# Patient Record
Sex: Male | Born: 1940 | Race: Black or African American | Hispanic: No | Marital: Married | Smoking: Former smoker
Health system: Southern US, Community
[De-identification: ages and names within clinical notes are randomized; demographics above are authoritative.]

## PROBLEM LIST (undated history)

## (undated) DIAGNOSIS — N390 Urinary tract infection, site not specified: Secondary | ICD-10-CM

## (undated) DIAGNOSIS — J849 Interstitial pulmonary disease, unspecified: Secondary | ICD-10-CM

## (undated) DIAGNOSIS — I10 Essential (primary) hypertension: Secondary | ICD-10-CM

## (undated) DIAGNOSIS — I77810 Thoracic aortic ectasia: Secondary | ICD-10-CM

## (undated) DIAGNOSIS — N1831 Chronic kidney disease, stage 3a: Secondary | ICD-10-CM

## (undated) DIAGNOSIS — I2510 Atherosclerotic heart disease of native coronary artery without angina pectoris: Secondary | ICD-10-CM

## (undated) DIAGNOSIS — M810 Age-related osteoporosis without current pathological fracture: Secondary | ICD-10-CM

## (undated) DIAGNOSIS — I350 Nonrheumatic aortic (valve) stenosis: Secondary | ICD-10-CM

## (undated) DIAGNOSIS — R011 Cardiac murmur, unspecified: Secondary | ICD-10-CM

## (undated) DIAGNOSIS — M109 Gout, unspecified: Secondary | ICD-10-CM

## (undated) DIAGNOSIS — E119 Type 2 diabetes mellitus without complications: Secondary | ICD-10-CM

## (undated) DIAGNOSIS — M1990 Unspecified osteoarthritis, unspecified site: Secondary | ICD-10-CM

## (undated) HISTORY — DX: Unspecified osteoarthritis, unspecified site: M19.90

## (undated) HISTORY — DX: Gout, unspecified: M10.9

## (undated) HISTORY — DX: Age-related osteoporosis without current pathological fracture: M81.0

## (undated) HISTORY — DX: Thoracic aortic ectasia: I77.810

## (undated) HISTORY — DX: Essential (primary) hypertension: I10

## (undated) HISTORY — DX: Type 2 diabetes mellitus without complications: E11.9

## (undated) HISTORY — DX: Nonrheumatic aortic (valve) stenosis: I35.0

## (undated) HISTORY — PX: JOINT REPLACEMENT: SHX530

## (undated) HISTORY — DX: Interstitial pulmonary disease, unspecified: J84.9

## (undated) HISTORY — DX: Atherosclerotic heart disease of native coronary artery without angina pectoris: I25.10

## (undated) HISTORY — DX: Chronic kidney disease, stage 3a: N18.31

---

## 2005-07-12 ENCOUNTER — Inpatient Hospital Stay (HOSPITAL_COMMUNITY): Admission: EM | Admit: 2005-07-12 | Discharge: 2005-07-16 | Payer: Self-pay | Admitting: Emergency Medicine

## 2005-07-14 ENCOUNTER — Ambulatory Visit: Payer: Self-pay | Admitting: Critical Care Medicine

## 2005-08-07 ENCOUNTER — Ambulatory Visit: Payer: Self-pay | Admitting: Critical Care Medicine

## 2005-09-14 ENCOUNTER — Ambulatory Visit (HOSPITAL_BASED_OUTPATIENT_CLINIC_OR_DEPARTMENT_OTHER): Admission: RE | Admit: 2005-09-14 | Discharge: 2005-09-14 | Payer: Self-pay | Admitting: Critical Care Medicine

## 2005-09-22 ENCOUNTER — Ambulatory Visit: Payer: Self-pay | Admitting: Pulmonary Disease

## 2006-06-24 ENCOUNTER — Encounter (HOSPITAL_COMMUNITY): Admission: RE | Admit: 2006-06-24 | Discharge: 2006-09-22 | Payer: Self-pay

## 2008-10-06 ENCOUNTER — Inpatient Hospital Stay (HOSPITAL_COMMUNITY): Admission: RE | Admit: 2008-10-06 | Discharge: 2008-10-07 | Payer: Self-pay | Admitting: Internal Medicine

## 2008-10-06 ENCOUNTER — Ambulatory Visit: Payer: Self-pay | Admitting: Radiology

## 2008-10-06 ENCOUNTER — Encounter: Payer: Self-pay | Admitting: Emergency Medicine

## 2010-01-02 ENCOUNTER — Encounter: Admission: RE | Admit: 2010-01-02 | Discharge: 2010-01-02 | Payer: Self-pay | Admitting: Gastroenterology

## 2010-08-26 LAB — DIFFERENTIAL
Basophils Absolute: 0.1 10*3/uL (ref 0.0–0.1)
Monocytes Relative: 7 % (ref 3–12)
Neutro Abs: 4.9 10*3/uL (ref 1.7–7.7)

## 2010-08-26 LAB — COMPREHENSIVE METABOLIC PANEL
Alkaline Phosphatase: 73 U/L (ref 39–117)
Calcium: 10.4 mg/dL (ref 8.4–10.5)
Chloride: 99 mEq/L (ref 96–112)
Creatinine, Ser: 1.2 mg/dL (ref 0.4–1.5)
GFR calc non Af Amer: >60 mL/min (ref >60)
Potassium: 4.1 mEq/L (ref 3.5–5.1)
Sodium: 141 mEq/L (ref 135–145)
Total Bilirubin: 0.6 mg/dL (ref 0.3–1.2)

## 2010-08-26 LAB — LIPASE, BLOOD: Lipase: 114 U/L (ref 23–300)

## 2010-08-26 LAB — URINALYSIS, ROUTINE W REFLEX MICROSCOPIC
Glucose, UA: NEGATIVE mg/dL
Protein, ur: NEGATIVE mg/dL
pH: 7.5 (ref 5.0–8.0)

## 2010-08-26 LAB — CBC
MCHC: 34 g/dL (ref 30.0–36.0)
Platelets: 256 10*3/uL (ref 150–400)
RBC: 5.01 MIL/uL (ref 4.22–5.81)
RDW: 12.6 % (ref 11.5–15.5)
WBC: 7 10*3/uL (ref 4.0–10.5)

## 2010-08-26 LAB — RAPID URINE DRUG SCREEN, HOSP PERFORMED
Amphetamines: NOT DETECTED
Benzodiazepines: NOT DETECTED

## 2010-08-26 LAB — HEPATITIS PANEL, ACUTE: HCV Ab: NEGATIVE

## 2010-08-26 LAB — URINE MICROSCOPIC-ADD ON

## 2010-08-26 LAB — CARDIAC PANEL(CRET KIN+CKTOT+MB+TROPI)
CK, MB: 2.9 ng/mL (ref 0.3–4.0)
Total CK: 152 U/L (ref 7–232)

## 2010-08-26 LAB — URINE CULTURE

## 2010-09-30 NOTE — Consult Note (Signed)
[name], [name] NO.:  [id number]   MEDICAL RECORD NO.:  [id number]          PATIENT TYPE:  INP   LOCATION:  1320                         FACILITY:  [facility]   PHYSICIAN:  [name] Steele, M.D. DATE OF BIRTH:  06/17/1940   DATE OF CONSULTATION:  10/06/2008  DATE OF DISCHARGE:  10/07/2008                                 CONSULTATION   REFERRING PHYSICIAN:  Carleene Steele, M.D.  [name] Orleans Lawyer, PA-C.   REASON FOR CONSULTATION:  Right upper quadrant abdominal pain since last  night.   IMPRESSION:  1. Acute non-infectious hepatitis secondary to erythromycin.  2. Hypertension, controlled.  3. Diabetes mellitus type 2, controlled.  4. Urinary tract infection being treated.  5. History of recurrent urinary tract infection.   RECOMMENDATIONS:  Discharge charge home with advice to discontinue  erythromycin.  Recommend light diet and oxycodone p.r.n. for pain for  two days.[name] Steele course of Reglan and omprazole. Follow up with primary  care physician in the coming week.  Will draw blood for cardiac enzymes  prior to discharge just to be sure this has not been atypical  presentation of myocardial ischemia.  Will draw blood for an acute  hepatitis,, although the erythromycin seems to be the most likely  culprit.   HISTORY OF PRESENT ILLNESS:  This is a 70 year old gentleman with  history of recurrent urinary tract infections, recently diagnosed with  urinary tract infection and prescribed Cipro which he started taking 2  days ago, also recently had a dental extraction and was prescribed  erythromycin which he started taking yesterday.  Last night developed an  episode of nausea and diarrhea and woke with severe right upper quadrant  abdominal pain, not relieved by Pepto-Bismol or Rolaids.  Eventually, as  the pain persisted, the patient presented to the free-standing emergency  room at [facility] and was evaluated for right upper quadrant abdominal  pain with  CT scan of the abdomen and pelvis with contrast.  CT scan did  not show any evidence of small bowel obstruction.  There were a few  loops of bowel that showed ileus.  The liver was fatty.  There were no  dilated biliary ducts.   The patient had complete metabolic panel done and ALT and AST were noted  to be elevated.  The patient gave a history of having had a reaction to  statins and has discontinued statins.  The impression was he had  discontinued statin within the past month and it was felt that was the  cause of abnormal liver functions tests. However, further history  reveals the patient was not taking any statins for about 2 years.  Therefore, it seems most likely that the elevated liver functions due to  recent use of erythromycin. Patient received hepatis immunization prior  to visit to [place] some years ago.   The patient has had no nausea or vomiting.  No fever, no chills.  The  pain is not colicky in nature.  He denies any cough or cold.   PAST MEDICAL HISTORY:  1. Hypertension.  2. Diabetes.  3. Recurrent urinary tract infection.  4. Periodically evaluated by urologist.  5. Gout.  6. History of bilateral knee replacement.   MEDICATIONS:  1. Allopurinol 100 mg twice daily.  2. Nifedipine XL 90 mg daily.  3. HCTZ 25 mg daily.  4. Aspirin 81 mg daily.  5. Avapro 300 mg daily.  6. Colchicine 0.6 mg p.r.n.  7. Metaglip 2.5/250 mg one tablet twice daily.  8. Fish oil 1000 mg daily.  9. Erythromycin 500 mg every six hours, started yesterday.  10.Cipro 250 mg daily, started May 20.   ALLERGIES:  PENICILLIN.   SOCIAL HISTORY:  Denies tobacco or illicit drug use.  Drinks alcohol  maybe once every 3 months.  The patient is a retired [employer] logistics/support/administrative officer.   FAMILY HISTORY:  The patient is only aware that his mother had end-stage  kidney disease but did not grow up around his family and, therefore, is  not aware of much of his family history.   REVIEW OF SYSTEMS:   A 10-point review of systems, other than noted  above, the patient is hard of hearing, but 10-point review of systems is  otherwise unremarkable.   PHYSICAL EXAMINATION:  GENERAL:  Pleasant, elderly gentleman, reclining  in the bed in no acute distress.  VITAL SIGNS:  Temperature 97.6, pulse 92, respirations 18, blood  pressure 120/60. Oxygen saturation 96% on room air.  HEENT:  Pupils are round and equal.  His mucous membranes are pink,  moist and anicteric.  He is not dehydrated.  NECK:  No cervical lymphadenopathy or thyromegaly.  No jugular venous  distension.  No carotid bruits.  CHEST:  Clear to auscultation bilaterally.  CARDIOVASCULAR:  Regular rhythm without murmur.  ABDOMEN:  Tender in the right upper quadrant.  He has normal abdominal  bowel sounds.  EXTREMITIES:  He has venous stasis changes but there is no calf  tenderness.  Bilateral knee scars, status post knee replacement.  CENTRAL NERVOUS SYSTEM:  Cranial nerves II-XII grossly intact.  No focal  neurologic deficits.   LABORATORY DATA:  CBC reviewed and his white count is 7, hemoglobin 16,  platelets 256,000.  Normal differential on his white count.  His  complete metabolic panel shows sodium 141, potassium 4.1, chloride 99,  CO2 26, BUN 17, creatinine 1.2, glucose 170, calcium 7.4, total protein  81, albumin 4.6, AST 77, ALT 71, both slightly elevated.  Alk phos is  normal at 73.  Total bilirubin normal at 0.6.  Lipase 114.  Urinalysis  showed cloudy urine with  a specific gravity of 1.019, 15 ketones, nitrites positive, leukocyte  esterase large.  Microscopy showed 21-50 white cells and many bacteria.  CT scan of the abdomen showed nonspecific ileus.  CT scan of the pelvis  showed a second bladder but no acute abnormalities.      [name] Steele, M.D.  Electronically Signed     LC/MEDQ  D:  10/07/2008  T:  10/07/2008  Job:  [id number]   cc:   [name] Steele, M.D.  [facility]   [name] Steele, M.D.  Fax: [phone]   [name] Dolly, PA
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
## 2010-10-03 NOTE — Procedures (Signed)
NAME:  [name] Steele, [name] Steele NO.:  [id number]   MEDICAL RECORD NO.:  [id number]          PATIENT TYPE:  OUT   LOCATION:  SLEEP CENTER                 FACILITY:  [facility]   PHYSICIAN:  Marcelyn Bruins, M.D. [facility] DATE OF BIRTH:  [birth year]   DATE OF STUDY:  09/14/2005                              NOCTURNAL POLYSOMNOGRAM   REFERRING PHYSICIAN:  Dr. Shan Levans   INDICATIONS:  Hypersomnia with sleep apnea.   EPWORTH SLEEPINESS SCORE:  2   SLEEP ARCHITECTURE:  The patient had a total sleep time of only 76 minutes.  He states since his retirement his usual bedtime has been approximately 3  a.m.  He never achieved slow wave sleep or REM.  Sleep onset latency was  very prolonged at 173 minutes.  Sleep efficiency was only 18%.   RESPIRATORY DATA:  The patient was found to have 49 hypopneas and 24 apneas  over the 76 minutes which gave him a respiratory disturbance index of 58  events per hour.  As soon as the patient fell asleep on a consistent basis  he began to have very significant obstructive sleep apnea.  The events  occurred all during the study and the events during the study occurred in  the supine position.  There was moderate to loud snoring noted throughout.   OXYGEN DATA:  There was O2 desaturation as low as 84% with the patient's  obstructive events.   CARDIAC DATA:  No clinically significant cardiac arrhythmias.   MOVEMENT-PARASOMNIA:  Small numbers of leg jerks with no significant sleep  disruption.   IMPRESSIONS-RECOMMENDATIONS:  Severe obstructive sleep apnea/hypopnea  syndrome with a respiratory disturbance index of 58 events per hour and O2  desaturation as low as 84%.  Despite the patient having a total sleep time  of only 76 minutes because of his abnormal sleep schedule, he began to have  very significant obstructive events as soon as he fell into a consistent  sleep.  Treatment for this degree of sleep apnea should focus primarily on  weight loss  coupled with CPAP.           ______________________________  Marcelyn Bruins, M.D. [facility]  Diplomate, American Board of Sleep  Medicine     KC/MEDQ  D:  09/22/2005 16:39:21  T:  09/23/2005 15:06:03  Job:  [id number]
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
## 2011-05-21 DIAGNOSIS — E119 Type 2 diabetes mellitus without complications: Secondary | ICD-10-CM | POA: Diagnosis not present

## 2011-05-21 DIAGNOSIS — I10 Essential (primary) hypertension: Secondary | ICD-10-CM | POA: Diagnosis not present

## 2011-08-20 DIAGNOSIS — E119 Type 2 diabetes mellitus without complications: Secondary | ICD-10-CM | POA: Diagnosis not present

## 2011-09-01 DIAGNOSIS — H905 Unspecified sensorineural hearing loss: Secondary | ICD-10-CM | POA: Diagnosis not present

## 2012-05-03 DIAGNOSIS — N3020 Other chronic cystitis without hematuria: Secondary | ICD-10-CM | POA: Diagnosis not present

## 2013-03-17 DIAGNOSIS — E119 Type 2 diabetes mellitus without complications: Secondary | ICD-10-CM | POA: Diagnosis not present

## 2013-03-17 DIAGNOSIS — I10 Essential (primary) hypertension: Secondary | ICD-10-CM | POA: Diagnosis not present

## 2013-03-21 DIAGNOSIS — N400 Enlarged prostate without lower urinary tract symptoms: Secondary | ICD-10-CM | POA: Diagnosis not present

## 2013-03-21 DIAGNOSIS — Z125 Encounter for screening for malignant neoplasm of prostate: Secondary | ICD-10-CM | POA: Diagnosis not present

## 2013-03-21 DIAGNOSIS — N3020 Other chronic cystitis without hematuria: Secondary | ICD-10-CM | POA: Diagnosis not present

## 2013-03-23 DIAGNOSIS — R51 Headache: Secondary | ICD-10-CM | POA: Diagnosis not present

## 2013-03-23 DIAGNOSIS — E119 Type 2 diabetes mellitus without complications: Secondary | ICD-10-CM | POA: Diagnosis not present

## 2013-03-23 DIAGNOSIS — Z006 Encounter for examination for normal comparison and control in clinical research program: Secondary | ICD-10-CM | POA: Diagnosis not present

## 2013-03-23 DIAGNOSIS — I10 Essential (primary) hypertension: Secondary | ICD-10-CM | POA: Diagnosis not present

## 2013-03-23 DIAGNOSIS — M542 Cervicalgia: Secondary | ICD-10-CM | POA: Diagnosis not present

## 2013-03-23 DIAGNOSIS — M549 Dorsalgia, unspecified: Secondary | ICD-10-CM | POA: Diagnosis not present

## 2013-04-11 DIAGNOSIS — I10 Essential (primary) hypertension: Secondary | ICD-10-CM | POA: Diagnosis not present

## 2013-04-11 DIAGNOSIS — E119 Type 2 diabetes mellitus without complications: Secondary | ICD-10-CM | POA: Diagnosis not present

## 2013-05-30 DIAGNOSIS — M899 Disorder of bone, unspecified: Secondary | ICD-10-CM | POA: Diagnosis not present

## 2013-05-30 DIAGNOSIS — W19XXXA Unspecified fall, initial encounter: Secondary | ICD-10-CM | POA: Diagnosis not present

## 2013-05-30 DIAGNOSIS — M949 Disorder of cartilage, unspecified: Secondary | ICD-10-CM | POA: Diagnosis not present

## 2013-05-30 DIAGNOSIS — Z880 Allergy status to penicillin: Secondary | ICD-10-CM | POA: Diagnosis not present

## 2013-05-30 DIAGNOSIS — I10 Essential (primary) hypertension: Secondary | ICD-10-CM | POA: Diagnosis not present

## 2013-07-11 DIAGNOSIS — I10 Essential (primary) hypertension: Secondary | ICD-10-CM | POA: Diagnosis not present

## 2013-07-11 DIAGNOSIS — M7700 Medial epicondylitis, unspecified elbow: Secondary | ICD-10-CM | POA: Diagnosis not present

## 2014-04-26 DIAGNOSIS — M5134 Other intervertebral disc degeneration, thoracic region: Secondary | ICD-10-CM | POA: Diagnosis not present

## 2014-04-26 DIAGNOSIS — M109 Gout, unspecified: Secondary | ICD-10-CM | POA: Diagnosis not present

## 2014-04-26 DIAGNOSIS — M546 Pain in thoracic spine: Secondary | ICD-10-CM | POA: Diagnosis not present

## 2014-04-26 DIAGNOSIS — M19049 Primary osteoarthritis, unspecified hand: Secondary | ICD-10-CM | POA: Diagnosis not present

## 2014-04-26 DIAGNOSIS — M79672 Pain in left foot: Secondary | ICD-10-CM | POA: Diagnosis not present

## 2014-04-26 DIAGNOSIS — M19071 Primary osteoarthritis, right ankle and foot: Secondary | ICD-10-CM | POA: Diagnosis not present

## 2014-04-26 DIAGNOSIS — M2550 Pain in unspecified joint: Secondary | ICD-10-CM | POA: Diagnosis not present

## 2014-05-05 ENCOUNTER — Ambulatory Visit (INDEPENDENT_AMBULATORY_CARE_PROVIDER_SITE_OTHER): Payer: Medicare Other

## 2014-05-05 ENCOUNTER — Ambulatory Visit (INDEPENDENT_AMBULATORY_CARE_PROVIDER_SITE_OTHER): Payer: Medicare Other | Admitting: Family Medicine

## 2014-05-05 VITALS — BP 140/82 | HR 97 | Temp 98.6°F | Resp 16 | Ht 68.5 in | Wt 207.4 lb

## 2014-05-05 DIAGNOSIS — R059 Cough, unspecified: Secondary | ICD-10-CM

## 2014-05-05 DIAGNOSIS — R609 Edema, unspecified: Secondary | ICD-10-CM

## 2014-05-05 DIAGNOSIS — R05 Cough: Secondary | ICD-10-CM

## 2014-05-05 DIAGNOSIS — M79672 Pain in left foot: Secondary | ICD-10-CM | POA: Diagnosis not present

## 2014-05-05 LAB — POCT CBC
Granulocyte percent: 75 %G (ref 37–80)
HCT, POC: 46.3 % (ref 43.5–53.7)
Hemoglobin: 15 g/dL (ref 14.1–18.1)
Lymph, poc: 1.5 (ref 0.6–3.4)
MCH, POC: 30.6 pg (ref 27–31.2)
MCHC: 32.4 g/dL (ref 31.8–35.4)
MCV: 94.2 fL (ref 80–97)
MID (cbc): 0.7 (ref 0–0.9)
MPV: 6.8 fL (ref 0–99.8)
POC Granulocyte: 6.7 (ref 2–6.9)
POC LYMPH PERCENT: 16.7 %L (ref 10–50)
POC MID %: 8.3 %M (ref 0–12)
Platelet Count, POC: 255 10*3/uL (ref 142–424)
RBC: 4.91 M/uL (ref 4.69–6.13)
RDW, POC: 13.8 %
WBC: 8.9 10*3/uL (ref 4.6–10.2)

## 2014-05-05 MED ORDER — HYDROCODONE-ACETAMINOPHEN 10-325 MG PO TABS: 1.0000 | ORAL_TABLET | Freq: Three times a day (TID) | ORAL | Status: DC | PRN

## 2014-05-05 MED ORDER — METHYLPREDNISOLONE ACETATE 80 MG/ML IJ SUSP
80.0000 mg | Freq: Once | INTRAMUSCULAR | Status: AC
  Administered 2014-05-05: 80 mg via INTRAMUSCULAR

## 2014-05-05 MED ORDER — HYDROCODONE-HOMATROPINE 5-1.5 MG/5ML PO SYRP: 5.0000 mL | ORAL_SOLUTION | Freq: Three times a day (TID) | ORAL | Status: DC | PRN

## 2014-05-05 NOTE — Patient Instructions (Signed)
Acute Bronchitis Bronchitis is inflammation of the airways that extend from the windpipe into the lungs (bronchi). The inflammation often causes mucus to develop. This leads to a cough, which is the most common symptom of bronchitis.  In acute bronchitis, the condition usually develops suddenly and goes away over time, usually in a couple weeks. Smoking, allergies, and asthma can make bronchitis worse. Repeated episodes of bronchitis may cause further lung problems.  CAUSES Acute bronchitis is most often caused by the same virus that causes a cold. The virus can spread from person to person (contagious) through coughing, sneezing, and touching contaminated objects. SIGNS AND SYMPTOMS   Cough.   Fever.   Coughing up mucus.   Body aches.   Chest congestion.   Chills.   Shortness of breath.   Sore throat.  DIAGNOSIS  Acute bronchitis is usually diagnosed through a physical exam. Your health care provider will also ask you questions about your medical history. Tests, such as chest X-rays, are sometimes done to rule out other conditions.  TREATMENT  Acute bronchitis usually goes away in a couple weeks. Oftentimes, no medical treatment is necessary. Medicines are sometimes given for relief of fever or cough. Antibiotic medicines are usually not needed but may be prescribed in certain situations. In some cases, an inhaler may be recommended to help reduce shortness of breath and control the cough. A cool mist vaporizer may also be used to help thin bronchial secretions and make it easier to clear the chest.  HOME CARE INSTRUCTIONS  Get plenty of rest.   Drink enough fluids to keep your urine clear or pale yellow (unless you have a medical condition that requires fluid restriction). Increasing fluids may help thin your respiratory secretions (sputum) and reduce chest congestion, and it will prevent dehydration.   Take medicines only as directed by your health care provider.  If  you were prescribed an antibiotic medicine, finish it all even if you start to feel better.  Avoid smoking and secondhand smoke. Exposure to cigarette smoke or irritating chemicals will make bronchitis worse. If you are a smoker, consider using nicotine gum or skin patches to help control withdrawal symptoms. Quitting smoking will help your lungs heal faster.   Reduce the chances of another bout of acute bronchitis by washing your hands frequently, avoiding people with cold symptoms, and trying not to touch your hands to your mouth, nose, or eyes.   Keep all follow-up visits as directed by your health care provider.  SEEK MEDICAL CARE IF: Your symptoms do not improve after 1 week of treatment.  SEEK IMMEDIATE MEDICAL CARE IF:  You develop an increased fever or chills.   You have chest pain.   You have severe shortness of breath.  You have bloody sputum.   You develop dehydration.  You faint or repeatedly feel like you are going to pass out.  You develop repeated vomiting.  You develop a severe headache. MAKE SURE YOU:   Understand these instructions.  Will watch your condition.  Will get help right away if you are not doing well or get worse. Document Released: 06/11/2004 Document Revised: 09/18/2013 Document Reviewed: 10/25/2012 [facility] Patient Information 2015 [place], [place]. This information is not intended to replace advice given to you by your health care provider. Make sure you discuss any questions you have with your health care provider. Gout Gout is an inflammatory arthritis caused by a buildup of uric acid crystals in the joints. Uric acid is a chemical that is  normally present in the blood. When the level of uric acid in the blood is too high it can form crystals that deposit in your joints and tissues. This causes joint redness, soreness, and swelling (inflammation). Repeat attacks are common. Over time, uric acid crystals can form into masses (tophi) near a  joint, destroying bone and causing disfigurement. Gout is treatable and often preventable. CAUSES  The disease begins with elevated levels of uric acid in the blood. Uric acid is produced by your body when it breaks down a naturally found substance called purines. Certain foods you eat, such as meats and fish, contain high amounts of purines. Causes of an elevated uric acid level include:  Being passed down from parent to child (heredity).  Diseases that cause increased uric acid production (such as obesity, psoriasis, and certain cancers).  Excessive alcohol use.  Diet, especially diets rich in meat and seafood.  Medicines, including certain cancer-fighting medicines (chemotherapy), water pills (diuretics), and aspirin.  Chronic kidney disease. The kidneys are no longer able to remove uric acid well.  Problems with metabolism. Conditions strongly associated with gout include:  Obesity.  High blood pressure.  High cholesterol.  Diabetes. Not everyone with elevated uric acid levels gets gout. It is not understood why some people get gout and others do not. Surgery, joint injury, and eating too much of certain foods are some of the factors that can lead to gout attacks. SYMPTOMS   An attack of gout comes on quickly. It causes intense pain with redness, swelling, and warmth in a joint.  Fever can occur.  Often, only one joint is involved. Certain joints are more commonly involved:  Base of the big toe.  Knee.  Ankle.  Wrist.  Finger. Without treatment, an attack usually goes away in a few days to weeks. Between attacks, you usually will not have symptoms, which is different from many other forms of arthritis. DIAGNOSIS  Your caregiver will suspect gout based on your symptoms and exam. In some cases, tests may be recommended. The tests may include:  Blood tests.  Urine tests.  X-rays.  Joint fluid exam. This exam requires a needle to remove fluid from the joint  (arthrocentesis). Using a microscope, gout is confirmed when uric acid crystals are seen in the joint fluid. TREATMENT  There are two phases to gout treatment: treating the sudden onset (acute) attack and preventing attacks (prophylaxis).  Treatment of an Acute Attack.  Medicines are used. These include anti-inflammatory medicines or steroid medicines.  An injection of steroid medicine into the affected joint is sometimes necessary.  The painful joint is rested. Movement can worsen the arthritis.  You may use warm or cold treatments on painful joints, depending which works best for you.  Treatment to Prevent Attacks.  If you suffer from frequent gout attacks, your caregiver may advise preventive medicine. These medicines are started after the acute attack subsides. These medicines either help your kidneys eliminate uric acid from your body or decrease your uric acid production. You may need to stay on these medicines for a very long time.  The early phase of treatment with preventive medicine can be associated with an increase in acute gout attacks. For this reason, during the first few months of treatment, your caregiver may also advise you to take medicines usually used for acute gout treatment. Be sure you understand your caregiver's directions. Your caregiver may make several adjustments to your medicine dose before these medicines are effective.  Discuss dietary treatment  with your caregiver or dietitian. Alcohol and drinks high in sugar and fructose and foods such as meat, poultry, and seafood can increase uric acid levels. Your caregiver or dietitian can advise you on drinks and foods that should be limited. HOME CARE INSTRUCTIONS   Do not take aspirin to relieve pain. This raises uric acid levels.  Only take over-the-counter or prescription medicines for pain, discomfort, or fever as directed by your caregiver.  Rest the joint as much as possible. When in bed, keep sheets and  blankets off painful areas.  Keep the affected joint raised (elevated).  Apply warm or cold treatments to painful joints. Use of warm or cold treatments depends on which works best for you.  Use crutches if the painful joint is in your leg.  Drink enough fluids to keep your urine clear or pale yellow. This helps your body get rid of uric acid. Limit alcohol, sugary drinks, and fructose drinks.  Follow your dietary instructions. Pay careful attention to the amount of protein you eat. Your daily diet should emphasize fruits, vegetables, whole grains, and fat-free or low-fat milk products. Discuss the use of coffee, vitamin C, and cherries with your caregiver or dietitian. These may be helpful in lowering uric acid levels.  Maintain a healthy body weight. SEEK MEDICAL CARE IF:   You develop diarrhea, vomiting, or any side effects from medicines.  You do not feel better in 24 hours, or you are getting worse. SEEK IMMEDIATE MEDICAL CARE IF:   Your joint becomes suddenly more tender, and you have chills or a fever. MAKE SURE YOU:   Understand these instructions.  Will watch your condition.  Will get help right away if you are not doing well or get worse. Document Released: 05/01/2000 Document Revised: 09/18/2013 Document Reviewed: 12/16/2011 [facility]Exit[facility] Patient Information 2015 [place]ExitCare, [place]LLC. This information is not intended to replace advice given to you by your health care provider. Make sure you discuss any questions you have with your health care provider.

## 2014-05-05 NOTE — Progress Notes (Signed)
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  Patient's problems precipitated a visit to his PCP, Dr. Merri BrunetteWalter Pharr, who prescribed prednisone. He got a little better but then the pain now is much much worse. He's having trouble moving his left wrist as well as flexing his feet. The pain is on the dorsal left wrist, and the dorsal aspects of both feet.  He's had a cough for about 5 days with no shortness of breath or chest pain.  Overall patient feels like he is falling apart  Objective: No acute distress the patient does look uncomfortable. HEENT: Normal Neck: Supple no adenopathy Chest: Few basilar rales Heart: Regular no murmur Abdomen: Soft nontender Extremities: Moderate swelling over the dorsal left wrist, dorsal left foot, and dorsal right foot. There is no pain or tenderness or swelling in the MTP joints of his toes.  [facility] reading (PRIMARY) by  Dr. Milus GlazierLauenstein: Diffuse reticular pattern consistent with a viral infection   Results for orders placed or performed in visit on 05/05/14  POCT CBC  Result Value Ref Range   WBC 8.9 4.6 - 10.2 K/uL   Lymph, poc 1.5 0.6 - 3.4   POC LYMPH PERCENT 16.7 10 - 50 %L   MID (cbc) 0.7 0 - 0.9   POC MID % 8.3 0 - 12 %M   POC Granulocyte 6.7 2 - 6.9   Granulocyte percent 75.0 37 - 80 %G   RBC 4.91 4.69 - 6.13 M/uL   Hemoglobin 15.0 14.1 - 18.1 g/dL   HCT, POC 36.646.3 44.043.5 - 53.7 %   MCV 94.2 80 - 97 fL   MCH, POC 30.6 27 - 31.2 pg   MCHC 32.4 31.8 - 35.4 g/dL   RDW, POC 34.713.8 %   Platelet Count, POC 255 142 - 424 K/uL   MPV 6.8 0 - 99.8 fL   . Assessment: Tablet patient has a viral bronchitis and acute gout that is been incompletely treated. The call Christie's taken in the past is that working now. The prednisone he took recently only helped him moderately.     ICD-9-CM ICD-10-CM   1. Cough 786.2 R05 POCT CBC     DG Chest 2 View     HYDROcodone-homatropine  (HYCODAN) 5-1.5 MG/5ML syrup     methylPREDNISolone acetate (DEPO-MEDROL) injection 80 mg  2. Edema 782.3 R60.9 Uric acid     DG Chest 2 View     Sedimentation rate     CANCELED: POCT SEDIMENTATION RATE  3. Left foot pain 729.5 M79.672 Uric acid     Sedimentation rate     HYDROcodone-homatropine (HYCODAN) 5-1.5 MG/5ML syrup     methylPREDNISolone acetate (DEPO-MEDROL) injection 80 mg     HYDROcodone-acetaminophen (NORCO) 10-325 MG per tablet     CANCELED: POCT SEDIMENTATION RATE     Signed, Elvina SidleKurt Anaisa Radi, MD

## 2014-05-06 ENCOUNTER — Other Ambulatory Visit: Payer: Self-pay | Admitting: Family Medicine

## 2014-05-06 DIAGNOSIS — M109 Gout, unspecified: Secondary | ICD-10-CM

## 2014-05-06 LAB — SEDIMENTATION RATE: Sed Rate: 14 mm/hr (ref 0–16)

## 2014-05-06 LAB — URIC ACID: Uric Acid, Serum: 10.1 mg/dL — ABNORMAL HIGH (ref 4.0–7.8)

## 2014-05-06 MED ORDER — ALLOPURINOL 100 MG PO TABS: 100.0000 mg | ORAL_TABLET | Freq: Every day | ORAL | Status: DC

## 2014-05-08 DIAGNOSIS — I10 Essential (primary) hypertension: Secondary | ICD-10-CM | POA: Diagnosis not present

## 2014-05-08 DIAGNOSIS — N390 Urinary tract infection, site not specified: Secondary | ICD-10-CM | POA: Diagnosis not present

## 2014-05-08 DIAGNOSIS — E118 Type 2 diabetes mellitus with unspecified complications: Secondary | ICD-10-CM | POA: Diagnosis not present

## 2014-05-08 DIAGNOSIS — Z125 Encounter for screening for malignant neoplasm of prostate: Secondary | ICD-10-CM | POA: Diagnosis not present

## 2014-06-12 DIAGNOSIS — M2550 Pain in unspecified joint: Secondary | ICD-10-CM | POA: Diagnosis not present

## 2014-06-12 DIAGNOSIS — M1A09X1 Idiopathic chronic gout, multiple sites, with tophus (tophi): Secondary | ICD-10-CM | POA: Diagnosis not present

## 2014-06-12 DIAGNOSIS — M25521 Pain in right elbow: Secondary | ICD-10-CM | POA: Diagnosis not present

## 2014-06-12 DIAGNOSIS — R5383 Other fatigue: Secondary | ICD-10-CM | POA: Diagnosis not present

## 2014-07-10 DIAGNOSIS — N390 Urinary tract infection, site not specified: Secondary | ICD-10-CM | POA: Diagnosis not present

## 2014-07-10 DIAGNOSIS — R5383 Other fatigue: Secondary | ICD-10-CM | POA: Diagnosis not present

## 2014-07-10 DIAGNOSIS — M25521 Pain in right elbow: Secondary | ICD-10-CM | POA: Diagnosis not present

## 2014-07-10 DIAGNOSIS — E119 Type 2 diabetes mellitus without complications: Secondary | ICD-10-CM | POA: Diagnosis not present

## 2014-07-10 DIAGNOSIS — M2550 Pain in unspecified joint: Secondary | ICD-10-CM | POA: Diagnosis not present

## 2014-07-10 DIAGNOSIS — M1A09X1 Idiopathic chronic gout, multiple sites, with tophus (tophi): Secondary | ICD-10-CM | POA: Diagnosis not present

## 2014-09-11 DIAGNOSIS — M2550 Pain in unspecified joint: Secondary | ICD-10-CM | POA: Diagnosis not present

## 2014-09-11 DIAGNOSIS — M25521 Pain in right elbow: Secondary | ICD-10-CM | POA: Diagnosis not present

## 2014-09-11 DIAGNOSIS — M1A09X1 Idiopathic chronic gout, multiple sites, with tophus (tophi): Secondary | ICD-10-CM | POA: Diagnosis not present

## 2014-09-11 DIAGNOSIS — R5383 Other fatigue: Secondary | ICD-10-CM | POA: Diagnosis not present

## 2014-09-15 DIAGNOSIS — S80219A Abrasion, unspecified knee, initial encounter: Secondary | ICD-10-CM | POA: Diagnosis not present

## 2014-09-15 DIAGNOSIS — S4990XA Unspecified injury of shoulder and upper arm, unspecified arm, initial encounter: Secondary | ICD-10-CM | POA: Diagnosis not present

## 2014-09-15 DIAGNOSIS — S4292XA Fracture of left shoulder girdle, part unspecified, initial encounter for closed fracture: Secondary | ICD-10-CM | POA: Diagnosis not present

## 2014-09-16 ENCOUNTER — Emergency Department (HOSPITAL_BASED_OUTPATIENT_CLINIC_OR_DEPARTMENT_OTHER)
Admission: EM | Admit: 2014-09-16 | Discharge: 2014-09-16 | Disposition: A | Discharge status: Home / Self Care | Payer: Medicare Other | Attending: Emergency Medicine | Admitting: Emergency Medicine

## 2014-09-16 ENCOUNTER — Encounter (HOSPITAL_BASED_OUTPATIENT_CLINIC_OR_DEPARTMENT_OTHER): Payer: Self-pay | Admitting: Emergency Medicine

## 2014-09-16 DIAGNOSIS — E119 Type 2 diabetes mellitus without complications: Secondary | ICD-10-CM | POA: Insufficient documentation

## 2014-09-16 DIAGNOSIS — Z880 Allergy status to penicillin: Secondary | ICD-10-CM | POA: Insufficient documentation

## 2014-09-16 DIAGNOSIS — Z720 Tobacco use: Secondary | ICD-10-CM | POA: Insufficient documentation

## 2014-09-16 DIAGNOSIS — M62838 Other muscle spasm: Secondary | ICD-10-CM | POA: Diagnosis not present

## 2014-09-16 DIAGNOSIS — Z7952 Long term (current) use of systemic steroids: Secondary | ICD-10-CM | POA: Diagnosis not present

## 2014-09-16 DIAGNOSIS — I10 Essential (primary) hypertension: Secondary | ICD-10-CM | POA: Diagnosis not present

## 2014-09-16 DIAGNOSIS — W1839XD Other fall on same level, subsequent encounter: Secondary | ICD-10-CM | POA: Insufficient documentation

## 2014-09-16 DIAGNOSIS — M109 Gout, unspecified: Secondary | ICD-10-CM | POA: Diagnosis not present

## 2014-09-16 DIAGNOSIS — M1990 Unspecified osteoarthritis, unspecified site: Secondary | ICD-10-CM | POA: Insufficient documentation

## 2014-09-16 DIAGNOSIS — S42202A Unspecified fracture of upper end of left humerus, initial encounter for closed fracture: Secondary | ICD-10-CM

## 2014-09-16 DIAGNOSIS — S42202D Unspecified fracture of upper end of left humerus, subsequent encounter for fracture with routine healing: Secondary | ICD-10-CM | POA: Insufficient documentation

## 2014-09-16 DIAGNOSIS — S42202P Unspecified fracture of upper end of left humerus, subsequent encounter for fracture with malunion: Secondary | ICD-10-CM | POA: Diagnosis not present

## 2014-09-16 DIAGNOSIS — Z79899 Other long term (current) drug therapy: Secondary | ICD-10-CM | POA: Diagnosis not present

## 2014-09-16 DIAGNOSIS — M25512 Pain in left shoulder: Secondary | ICD-10-CM | POA: Diagnosis present

## 2014-09-16 NOTE — Discharge Instructions (Signed)
Please read and follow all provided instructions.  Your diagnoses today include:  1. Proximal humerus fracture, left, closed, initial encounter    Tests performed today include:  Vital signs. See below for your results today.   Medications prescribed:   None  Take any prescribed medications only as directed.  Home care instructions:   Follow any educational materials contained in this packet  Follow R.I.C.E. Protocol:  R - rest your injury   I  - use ice on injury without applying directly to skin  C - compress injury with bandage or splint  E - elevate the injury as much as possible  Follow-up instructions: Please follow-up with your primary care provider or the provided orthopedic physician (bone specialist) in the next 3 days.   Return instructions:   Please return if your fingers are numb or tingling, appear gray or blue, or you have severe pain (also elevate the arm and loosen splint or wrap if you were given one)  Please return to the Emergency Department if you experience worsening symptoms.   Please return if you have any other emergent concerns.  Additional Information:  Your vital signs today were: BP 170/82 mmHg   Pulse 68   Temp(Src) 97.9 F (36.6 C) (Oral)   Resp 20   Ht 5\' 10"  (1.778 m)   Wt 215 lb (97.523 kg)   BMI 30.85 kg/m2   SpO2 94% If your blood pressure (BP) was elevated above 135/85 this visit, please have this repeated by your doctor within one month. --------------

## 2014-09-16 NOTE — ED Provider Notes (Signed)
CSN: [id number]     Arrival date & time 09/16/14  1211 History   First MD Initiated Contact with Patient 09/16/14 1233     Chief Complaint  Patient presents with  . Fall     (Consider location/radiation/quality/duration/timing/severity/associated sxs/prior Treatment) HPI Comments: Patient presents with complaint of left shoulder pain after a fall which occurred yesterday. No significant head or neck injury reported. Patient slipped on a hill and tumbled down the hill. He was seen at an urgent care in the [facility]. Patient had x-rays done. He was placed in a shoulder immobilizer and encouraged to follow-up with orthopedics. Patient states that today they tried to go to an orthopedic clinic today but it was closed. They come to the emergency department for orthopedic evaluation. Patient is on hydrocodone-acetaminophen which is controlling his pain at the current time. Dr. Renne Crigler this is PCP. Patient does not report any numbness or tingling in his arm.  The history is provided by the patient and medical records.    Past Medical History  Diagnosis Date  . Arthritis   . Diabetes mellitus without complication   . Hypertension   . Osteoporosis   . Gout    Past Surgical History  Procedure Laterality Date  . Joint replacement Bilateral    Family History  Problem Relation Age of Onset  . Heart disease Sister    History  Substance Use Topics  . Smoking status: Former Games developer  . Smokeless tobacco: Never Used  . Alcohol Use: 0.0 oz/week    0 Standard drinks or equivalent per week     Comment: social    Review of Systems  Constitutional: Negative for activity change.  Musculoskeletal: Positive for arthralgias. Negative for back pain, joint swelling, gait problem and neck pain.  Skin: Negative for wound.  Neurological: Negative for weakness and numbness.      Allergies  Erythromycin and Penicillins  Home Medications   Prior to Admission medications    Medication Sig Start Date End Date Taking? Authorizing Provider  HYDROcodone-acetaminophen (NORCO/VICODIN) 5-325 MG per tablet Take 1 tablet by mouth every 6 (six) hours as needed for moderate pain.   Yes Historical Provider, MD  nitrofurantoin (MACRODANTIN) 25 MG capsule Take 100 mg by mouth at bedtime.   Yes Historical Provider, MD  predniSONE (DELTASONE) 10 MG tablet Take 5 mg by mouth daily with breakfast.   Yes Historical Provider, MD  allopurinol (ZYLOPRIM) 100 MG tablet Take 1 tablet (100 mg total) by mouth daily. 05/06/14   Elvina Sidle, MD  amLODipine (NORVASC) 10 MG tablet Take 10 mg by mouth daily.    Historical Provider, MD  aspirin 81 MG tablet Take 81 mg by mouth daily.    Historical Provider, MD  glipiZIDE-metformin (METAGLIP) 2.5-500 MG per tablet Take 1 tablet by mouth 2 (two) times daily before a meal.    Historical Provider, MD  HYDROcodone-acetaminophen (NORCO) 10-325 MG per tablet Take 1 tablet by mouth every 8 (eight) hours as needed. 05/05/14   Elvina Sidle, MD  HYDROcodone-homatropine [facility]) 5-1.5 MG/5ML syrup Take 5 mLs by mouth every 8 (eight) hours as needed for cough. 05/05/14   Elvina Sidle, MD  losartan-hydrochlorothiazide (HYZAAR) 100-25 MG per tablet Take 1 tablet by mouth daily.    Historical Provider, MD   BP 170/82 mmHg  Pulse 68  Temp(Src) 97.9 F (36.6 C) (Oral)  Resp 20  Ht  (1.778 m)  Wt 215 lb (97.523 kg)  BMI 30.85 kg/m2  SpO2  94% Physical Exam  Constitutional: He appears well-developed and well-nourished.  HENT:  Head: Normocephalic and atraumatic.  Eyes: Conjunctivae are normal.  Neck: Normal range of motion. Neck supple.  Cardiovascular: Normal pulses.   Pulses:      Radial pulses are 2+ on the right side, and 2+ on the left side.  Musculoskeletal: He exhibits tenderness. He exhibits no edema.       Left shoulder: He exhibits decreased range of motion, tenderness, bony tenderness, swelling, pain and spasm. He exhibits no  deformity.       Left elbow: Normal.       Left wrist: Normal.       Left upper arm: Normal.       Left forearm: Normal.       Left hand: Normal.  Neurological: He is alert. No sensory deficit.  Motor, sensation, and vascular distal to the injury is fully intact.   Skin: Skin is warm and dry.  Psychiatric: He has a normal mood and affect.  Nursing note and vitals reviewed.   ED Course  Procedures (including critical care time) Labs Review Labs Reviewed - No data to display  Imaging Review No results found.   EKG Interpretation None       1:06 PM Patient seen and examined.    Vital signs reviewed and are as follows: BP 170/82 mmHg  Pulse 68  Temp(Src) 97.9 F (36.6 C) (Oral)  Resp 20  Ht 5\' 10"  (1.778 m)  Wt 215 lb (97.523 kg)  BMI 30.85 kg/m2  SpO2 94%  I reviewed the x-ray images brought with the patient on CD. This demonstrates a proximal humerus fracture on the left side. I provided the patient with orthopedic surgery referral. Patient will also follow-up with his primary care physician tomorrow. Otherwise, continue pain control with Vicodin, continue shoulder immobilizer, follow-up with orthopedist.    MDM   Final diagnoses:  Proximal humerus fracture, left, closed, initial encounter   Patient was proximal humerus fracture diagnosed yesterday. Patient is undergoing appropriate treatment at this time. Provided orthopedic follow-up for the patient. Upper extremity is neurovascularly intact at this time. No concern for compartment syndrome. Normal pulses and distal sensation.    Renne CriglerJoshua Kathrene Sinopoli, PA-C 09/16/14 1347  Blake DivineJohn Wofford, MD 09/16/14 [phone]

## 2014-09-16 NOTE — ED Notes (Addendum)
Per spouse patient fell yesterday and injured left arm.  Reports that they were out of town and were seen at an urgent care who told them to follow up with orthopedics.  Informed patient and spouse that we did not have orthopedics here. Spouse voiced understanding.  Spouse brought copies of paperwork.  Patient given RX for pain medication.

## 2014-09-17 DIAGNOSIS — S42272A Torus fracture of upper end of left humerus, initial encounter for closed fracture: Secondary | ICD-10-CM | POA: Diagnosis not present

## 2014-09-24 DIAGNOSIS — S42272D Torus fracture of upper end of left humerus, subsequent encounter for fracture with routine healing: Secondary | ICD-10-CM | POA: Diagnosis not present

## 2014-10-04 DIAGNOSIS — M1A09X1 Idiopathic chronic gout, multiple sites, with tophus (tophi): Secondary | ICD-10-CM | POA: Diagnosis not present

## 2014-10-22 DIAGNOSIS — S42272D Torus fracture of upper end of left humerus, subsequent encounter for fracture with routine healing: Secondary | ICD-10-CM | POA: Diagnosis not present

## 2014-10-29 DIAGNOSIS — M1A09X1 Idiopathic chronic gout, multiple sites, with tophus (tophi): Secondary | ICD-10-CM | POA: Diagnosis not present

## 2014-10-29 DIAGNOSIS — M2550 Pain in unspecified joint: Secondary | ICD-10-CM | POA: Diagnosis not present

## 2014-10-31 DIAGNOSIS — S42272D Torus fracture of upper end of left humerus, subsequent encounter for fracture with routine healing: Secondary | ICD-10-CM | POA: Diagnosis not present

## 2014-11-06 DIAGNOSIS — S42272D Torus fracture of upper end of left humerus, subsequent encounter for fracture with routine healing: Secondary | ICD-10-CM | POA: Diagnosis not present

## 2014-11-08 DIAGNOSIS — S42272D Torus fracture of upper end of left humerus, subsequent encounter for fracture with routine healing: Secondary | ICD-10-CM | POA: Diagnosis not present

## 2014-11-13 DIAGNOSIS — S42272D Torus fracture of upper end of left humerus, subsequent encounter for fracture with routine healing: Secondary | ICD-10-CM | POA: Diagnosis not present

## 2014-11-15 DIAGNOSIS — S42272D Torus fracture of upper end of left humerus, subsequent encounter for fracture with routine healing: Secondary | ICD-10-CM | POA: Diagnosis not present

## 2014-11-20 DIAGNOSIS — S42272D Torus fracture of upper end of left humerus, subsequent encounter for fracture with routine healing: Secondary | ICD-10-CM | POA: Diagnosis not present

## 2014-11-22 DIAGNOSIS — S42272D Torus fracture of upper end of left humerus, subsequent encounter for fracture with routine healing: Secondary | ICD-10-CM | POA: Diagnosis not present

## 2014-11-27 DIAGNOSIS — S42272D Torus fracture of upper end of left humerus, subsequent encounter for fracture with routine healing: Secondary | ICD-10-CM | POA: Diagnosis not present

## 2014-11-28 DIAGNOSIS — S42272D Torus fracture of upper end of left humerus, subsequent encounter for fracture with routine healing: Secondary | ICD-10-CM | POA: Diagnosis not present

## 2014-11-29 DIAGNOSIS — S42272D Torus fracture of upper end of left humerus, subsequent encounter for fracture with routine healing: Secondary | ICD-10-CM | POA: Diagnosis not present

## 2014-12-04 DIAGNOSIS — S42272D Torus fracture of upper end of left humerus, subsequent encounter for fracture with routine healing: Secondary | ICD-10-CM | POA: Diagnosis not present

## 2014-12-06 DIAGNOSIS — S42272D Torus fracture of upper end of left humerus, subsequent encounter for fracture with routine healing: Secondary | ICD-10-CM | POA: Diagnosis not present

## 2014-12-11 DIAGNOSIS — S42272D Torus fracture of upper end of left humerus, subsequent encounter for fracture with routine healing: Secondary | ICD-10-CM | POA: Diagnosis not present

## 2014-12-13 DIAGNOSIS — S42272D Torus fracture of upper end of left humerus, subsequent encounter for fracture with routine healing: Secondary | ICD-10-CM | POA: Diagnosis not present

## 2014-12-26 DIAGNOSIS — S42272D Torus fracture of upper end of left humerus, subsequent encounter for fracture with routine healing: Secondary | ICD-10-CM | POA: Diagnosis not present

## 2015-04-30 DIAGNOSIS — Z125 Encounter for screening for malignant neoplasm of prostate: Secondary | ICD-10-CM | POA: Diagnosis not present

## 2015-04-30 DIAGNOSIS — E119 Type 2 diabetes mellitus without complications: Secondary | ICD-10-CM | POA: Diagnosis not present

## 2015-04-30 DIAGNOSIS — N390 Urinary tract infection, site not specified: Secondary | ICD-10-CM | POA: Diagnosis not present

## 2015-04-30 DIAGNOSIS — I10 Essential (primary) hypertension: Secondary | ICD-10-CM | POA: Diagnosis not present

## 2015-04-30 DIAGNOSIS — M1A09X1 Idiopathic chronic gout, multiple sites, with tophus (tophi): Secondary | ICD-10-CM | POA: Diagnosis not present

## 2015-04-30 DIAGNOSIS — E790 Hyperuricemia without signs of inflammatory arthritis and tophaceous disease: Secondary | ICD-10-CM | POA: Diagnosis not present

## 2015-04-30 DIAGNOSIS — R5383 Other fatigue: Secondary | ICD-10-CM | POA: Diagnosis not present

## 2015-05-09 DIAGNOSIS — N390 Urinary tract infection, site not specified: Secondary | ICD-10-CM | POA: Diagnosis not present

## 2015-06-04 DIAGNOSIS — R8271 Bacteriuria: Secondary | ICD-10-CM | POA: Diagnosis not present

## 2015-06-04 DIAGNOSIS — Z0000 Encounter for general adult medical examination without abnormal findings: Secondary | ICD-10-CM | POA: Diagnosis not present

## 2015-08-06 DIAGNOSIS — B351 Tinea unguium: Secondary | ICD-10-CM | POA: Diagnosis not present

## 2015-08-06 DIAGNOSIS — M546 Pain in thoracic spine: Secondary | ICD-10-CM | POA: Diagnosis not present

## 2015-08-06 DIAGNOSIS — L84 Corns and callosities: Secondary | ICD-10-CM | POA: Diagnosis not present

## 2015-08-06 DIAGNOSIS — E119 Type 2 diabetes mellitus without complications: Secondary | ICD-10-CM | POA: Diagnosis not present

## 2015-08-08 DIAGNOSIS — M47814 Spondylosis without myelopathy or radiculopathy, thoracic region: Secondary | ICD-10-CM | POA: Diagnosis not present

## 2015-08-08 DIAGNOSIS — M546 Pain in thoracic spine: Secondary | ICD-10-CM | POA: Diagnosis not present

## 2015-08-08 DIAGNOSIS — E119 Type 2 diabetes mellitus without complications: Secondary | ICD-10-CM | POA: Diagnosis not present

## 2015-09-03 ENCOUNTER — Ambulatory Visit (INDEPENDENT_AMBULATORY_CARE_PROVIDER_SITE_OTHER): Payer: Medicare Other | Admitting: Podiatry

## 2015-09-03 ENCOUNTER — Encounter: Payer: Self-pay | Admitting: Podiatry

## 2015-09-03 VITALS — BP 190/97 | HR 88 | Resp 14

## 2015-09-03 DIAGNOSIS — B351 Tinea unguium: Secondary | ICD-10-CM

## 2015-09-03 DIAGNOSIS — M79676 Pain in unspecified toe(s): Secondary | ICD-10-CM

## 2015-09-03 DIAGNOSIS — Q828 Other specified congenital malformations of skin: Secondary | ICD-10-CM | POA: Diagnosis not present

## 2015-09-03 DIAGNOSIS — E119 Type 2 diabetes mellitus without complications: Secondary | ICD-10-CM | POA: Diagnosis not present

## 2015-09-03 NOTE — Progress Notes (Addendum)
   Subjective:    Patient ID: Charlena CrossNathaniel Bloom, male    DOB: 07/11/1940, 75 y.o.   MRN: [id number]  HPI this patient presents to the office with chief complaint of long thick painful nails. He states that his nails are long and thick and are painful walking and wearing his shoes. He is unable to self treat.  This patient is a diabetic who requests a foot exam at this visit.  He presents for preventative foot care services.  He has a history of 2 knee replacements both knees    Review of Systems  All other systems reviewed and are negative.      Objective:   Physical Exam GENERAL APPEARANCE: Alert, conversant. Appropriately groomed. No acute distress.  VASCULAR: Pedal pulses are  palpable at  [facility] and PT bilateral.  Capillary refill time is immediate to all digits,  Normal temperature gradient.  Digital hair growth is present bilateral  NEUROLOGIC: sensation is normal to 5.07 monofilament at 5/5 sites bilateral.  Light touch is intact bilateral, Muscle strength normal.  MUSCULOSKELETAL: acceptable muscle strength, tone and stability bilateral.  Intrinsic muscluature intact bilateral.  Rectus appearance of foot and digits noted bilateral. Swelling noted, feet bilateral   DERMATOLOGIC: skin color, texture, and turgor are within normal limits.  No preulcerative lesions or ulcers  are seen, no interdigital maceration noted.  No open lesions present.  . No drainage noted.  Asymptomatic porokeratosis B/L  NAILS  thick disfigured discolored nails both feet         Assessment & Plan:  Onychomycosis B/L  Diabetes with no complications.  IE  Debride Onychomycotic Nails B/L.  Patient described tightening in the bottom of his foot extending from his heel into his toes.  Recommended powerstep insoles.   Helane GuntherGregory Mayer DPM     Helane GuntherGregory Mayer DPM

## 2015-10-24 DIAGNOSIS — Z125 Encounter for screening for malignant neoplasm of prostate: Secondary | ICD-10-CM | POA: Diagnosis not present

## 2015-10-24 DIAGNOSIS — E119 Type 2 diabetes mellitus without complications: Secondary | ICD-10-CM | POA: Diagnosis not present

## 2015-10-24 DIAGNOSIS — I10 Essential (primary) hypertension: Secondary | ICD-10-CM | POA: Diagnosis not present

## 2015-10-24 DIAGNOSIS — N390 Urinary tract infection, site not specified: Secondary | ICD-10-CM | POA: Diagnosis not present

## 2015-10-30 DIAGNOSIS — Z1212 Encounter for screening for malignant neoplasm of rectum: Secondary | ICD-10-CM | POA: Diagnosis not present

## 2015-10-30 DIAGNOSIS — E119 Type 2 diabetes mellitus without complications: Secondary | ICD-10-CM | POA: Diagnosis not present

## 2015-10-30 DIAGNOSIS — M1A09X1 Idiopathic chronic gout, multiple sites, with tophus (tophi): Secondary | ICD-10-CM | POA: Diagnosis not present

## 2015-10-30 DIAGNOSIS — M2550 Pain in unspecified joint: Secondary | ICD-10-CM | POA: Diagnosis not present

## 2015-10-30 DIAGNOSIS — E790 Hyperuricemia without signs of inflammatory arthritis and tophaceous disease: Secondary | ICD-10-CM | POA: Diagnosis not present

## 2015-11-06 ENCOUNTER — Ambulatory Visit (INDEPENDENT_AMBULATORY_CARE_PROVIDER_SITE_OTHER): Payer: Medicare Other | Admitting: Podiatry

## 2015-11-06 ENCOUNTER — Encounter: Payer: Self-pay | Admitting: Podiatry

## 2015-11-06 DIAGNOSIS — E119 Type 2 diabetes mellitus without complications: Secondary | ICD-10-CM

## 2015-11-06 DIAGNOSIS — B351 Tinea unguium: Secondary | ICD-10-CM | POA: Diagnosis not present

## 2015-11-06 DIAGNOSIS — M79676 Pain in unspecified toe(s): Secondary | ICD-10-CM | POA: Diagnosis not present

## 2015-11-06 NOTE — Progress Notes (Signed)
   Subjective:    Patient ID: Charlena CrossNathaniel Hevia, male    DOB: 07/30/1940, 75 y.o.   MRN: [id number]  HPI this patient presents to the office with chief complaint of long thick painful nails. He states that his nails are long and thick and are painful walking and wearing his shoes. He is unable to self treat.  This patient is a diabetic who requests a foot exam at this visit.  He presents for preventative foot care services.  He has a history of 2 knee replacements both knees    Review of Systems  All other systems reviewed and are negative.      Objective:   Physical Exam GENERAL APPEARANCE: Alert, conversant. Appropriately groomed. No acute distress.  VASCULAR: Pedal pulses are  palpable at  [facility]P and PT bilateral.  Capillary refill time is immediate to all digits,  Normal temperature gradient.  Digital hair growth is present bilateral  NEUROLOGIC: sensation is normal to 5.07 monofilament at 5/5 sites bilateral.  Light touch is intact bilateral, Muscle strength normal.  MUSCULOSKELETAL: acceptable muscle strength, tone and stability bilateral.  Intrinsic muscluature intact bilateral.  Rectus appearance of foot and digits noted bilateral. Swelling noted, feet bilateral   DERMATOLOGIC: skin color, texture, and turgor are within normal limits.  No preulcerative lesions or ulcers  are seen, no interdigital maceration noted.  No open lesions present.  . No drainage noted.  Asymptomatic porokeratosis B/L  NAILS  thick disfigured discolored nails both feet         Assessment & Plan:  Onychomycosis B/L  Diabetes with no complications.  IE  Debride Onychomycotic Nails B/L.    RTC 10 weeks.   Helane GuntherGregory Desmon Hitchner DPM   Helane GuntherGregory Khameron Gruenwald DPM     Helane GuntherGregory Emmali Karow DPM

## 2015-11-12 DIAGNOSIS — E119 Type 2 diabetes mellitus without complications: Secondary | ICD-10-CM | POA: Diagnosis not present

## 2015-12-25 DIAGNOSIS — Z1212 Encounter for screening for malignant neoplasm of rectum: Secondary | ICD-10-CM | POA: Diagnosis not present

## 2015-12-25 DIAGNOSIS — Z1211 Encounter for screening for malignant neoplasm of colon: Secondary | ICD-10-CM | POA: Diagnosis not present

## 2016-01-15 ENCOUNTER — Ambulatory Visit: Payer: Medicare Other | Admitting: Podiatry

## 2016-02-28 DIAGNOSIS — E119 Type 2 diabetes mellitus without complications: Secondary | ICD-10-CM | POA: Diagnosis not present

## 2016-03-03 DIAGNOSIS — M25511 Pain in right shoulder: Secondary | ICD-10-CM | POA: Diagnosis not present

## 2016-03-03 DIAGNOSIS — E119 Type 2 diabetes mellitus without complications: Secondary | ICD-10-CM | POA: Diagnosis not present

## 2016-03-03 DIAGNOSIS — I10 Essential (primary) hypertension: Secondary | ICD-10-CM | POA: Diagnosis not present

## 2016-03-03 DIAGNOSIS — M25512 Pain in left shoulder: Secondary | ICD-10-CM | POA: Diagnosis not present

## 2016-03-17 DIAGNOSIS — M1990 Unspecified osteoarthritis, unspecified site: Secondary | ICD-10-CM | POA: Diagnosis not present

## 2016-03-17 DIAGNOSIS — M549 Dorsalgia, unspecified: Secondary | ICD-10-CM | POA: Diagnosis not present

## 2016-06-29 DIAGNOSIS — E119 Type 2 diabetes mellitus without complications: Secondary | ICD-10-CM | POA: Diagnosis not present

## 2016-06-30 DIAGNOSIS — E119 Type 2 diabetes mellitus without complications: Secondary | ICD-10-CM | POA: Diagnosis not present

## 2016-06-30 DIAGNOSIS — J069 Acute upper respiratory infection, unspecified: Secondary | ICD-10-CM | POA: Diagnosis not present

## 2016-07-28 DIAGNOSIS — R197 Diarrhea, unspecified: Secondary | ICD-10-CM | POA: Diagnosis not present

## 2016-10-01 DIAGNOSIS — E790 Hyperuricemia without signs of inflammatory arthritis and tophaceous disease: Secondary | ICD-10-CM | POA: Diagnosis not present

## 2016-10-01 DIAGNOSIS — M25531 Pain in right wrist: Secondary | ICD-10-CM | POA: Diagnosis not present

## 2016-10-01 DIAGNOSIS — I10 Essential (primary) hypertension: Secondary | ICD-10-CM | POA: Diagnosis not present

## 2016-10-01 DIAGNOSIS — E119 Type 2 diabetes mellitus without complications: Secondary | ICD-10-CM | POA: Diagnosis not present

## 2016-11-02 DIAGNOSIS — E119 Type 2 diabetes mellitus without complications: Secondary | ICD-10-CM | POA: Diagnosis not present

## 2016-11-02 DIAGNOSIS — Z125 Encounter for screening for malignant neoplasm of prostate: Secondary | ICD-10-CM | POA: Diagnosis not present

## 2016-11-02 DIAGNOSIS — I10 Essential (primary) hypertension: Secondary | ICD-10-CM | POA: Diagnosis not present

## 2016-11-02 DIAGNOSIS — N390 Urinary tract infection, site not specified: Secondary | ICD-10-CM | POA: Diagnosis not present

## 2016-11-05 DIAGNOSIS — I10 Essential (primary) hypertension: Secondary | ICD-10-CM | POA: Diagnosis not present

## 2016-11-05 DIAGNOSIS — M546 Pain in thoracic spine: Secondary | ICD-10-CM | POA: Diagnosis not present

## 2016-11-05 DIAGNOSIS — Z880 Allergy status to penicillin: Secondary | ICD-10-CM | POA: Diagnosis not present

## 2016-11-05 DIAGNOSIS — M898X1 Other specified disorders of bone, shoulder: Secondary | ICD-10-CM | POA: Diagnosis not present

## 2016-12-14 ENCOUNTER — Ambulatory Visit (INDEPENDENT_AMBULATORY_CARE_PROVIDER_SITE_OTHER): Payer: Medicare Other | Admitting: Sports Medicine

## 2016-12-14 ENCOUNTER — Encounter: Payer: Self-pay | Admitting: Sports Medicine

## 2016-12-14 VITALS — BP 178/92 | Ht 70.5 in | Wt 200.0 lb

## 2016-12-14 DIAGNOSIS — M546 Pain in thoracic spine: Secondary | ICD-10-CM

## 2016-12-14 DIAGNOSIS — G8929 Other chronic pain: Secondary | ICD-10-CM

## 2016-12-14 NOTE — Progress Notes (Signed)
  Charlena Crossathaniel Pollick - 76 y.o. male MRN [id number]  Date of birth: 10/07/1940  SUBJECTIVE:  Including CC & ROS.  CC: Back Pain  Patient describes back pain that has been present for about 2 yrs. He had xrays for this in 2015 and 2017. Describes pain as a band that goes across upper back below shoulder blades. This is intermittent in nature and seems to occur more when he is sitting for a long period of time. Pain will be relieved with changing posture.   Denies any traumatic events, arm weakness or numbness, neck pain, pain radiating down back.   He takes ibuprofen for this occasionally which seems to help the pain.    HISTORY: Past Medical, Surgical, Social, and Family History Reviewed & Updated per EMR.   Pertinent Historical Findings include: HTN Type 2 DM Gout   DATA REVIEWED: Xray Thoracic spine 08/08/15:  Mild degenerative changes of the thoracic discs. No compression fracture or other acute bony abnormality  Xray thoracic spine 05/06/14:  Diffuse degenerative change thoracic spine  PHYSICAL EXAM:  VS: BP:   HR: bpm  TEMP: ( )  RESP:   HT:    WT:   BMI:  PHYSICAL EXAM:  Back Exam: Inspection: Unremarkable  Motion: Flexion 45 deg, Extension 45 deg, Side Bending to 45 deg bilaterally,  Rotation to 45 deg bilaterally  Palpable tenderness: None. Reports that when he has pain it is at level of approx T7 running transversely across back.   Leg strength  Quad: 5/5 Hamstring: 5/5 Hip flexor: 5/5 Gait unremarkable.   ASSESSMENT & PLAN: See problem based charting & AVS for pt instructions.  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 Patient given scapular protraction exercises to help correct some posture and keep scapula back. Instructed to use ibuprofen 200mg  as needed as this has given him some relief. Patient will return to  care in 6 weeks for follow up.

## 2017-01-28 ENCOUNTER — Encounter: Payer: Self-pay | Admitting: Sports Medicine

## 2017-01-28 ENCOUNTER — Ambulatory Visit (INDEPENDENT_AMBULATORY_CARE_PROVIDER_SITE_OTHER): Payer: Medicare Other | Admitting: Sports Medicine

## 2017-01-28 VITALS — BP 154/98 | Ht 70.5 in | Wt 210.0 lb

## 2017-01-28 DIAGNOSIS — M546 Pain in thoracic spine: Secondary | ICD-10-CM

## 2017-01-28 DIAGNOSIS — G8929 Other chronic pain: Secondary | ICD-10-CM | POA: Diagnosis not present

## 2017-01-28 NOTE — Progress Notes (Signed)
   Subjective:    Patient ID: [name] Steele, male    DOB: 08/25/1940, 76 y.o.   MRN: [id number]  76yo M who presents to the clinic for reevaluation of mid-back pain.  He was seen 6 weeks ago for an exacerbation of his chronic thoracic pain with muscle spasms. He has known DDD in his thoracic spine. Today, he states he feels he is improving. He has been performing his scapular protraction/retraction exercises without weight. He also purchased a mid-back brace for which he has been wearing most of the time which helps. He is now back to bowling three times per week and bowled quite well yesterday. He still has intermittent, non radiating mid back pain. Pain is worse with forward flexion or with standing from a seated position. Other associated symptoms: no numbness, tingling or radiculopathy.      Review of Systems  Musculoskeletal: Positive for back pain and myalgias. Negative for arthralgias, gait problem and joint swelling.       + intermittent mid-thoracic stiffness, no mechanical falls  Neurological: Negative for weakness and numbness.       Objective:   Physical Exam  Constitutional: He appears well-developed and well-nourished.  Musculoskeletal:  No obvious abnormalities upon inspection of the cervical/thoracic/lumbar regions. Posture at baseline has protracted shoulders and flexion of cervical and mid thoracic spine. Patient wearing thoracic spine soft brace. No tenderness to palpation in cervical/thoracic/lumbar spines midline. No paraspinal tenderness. Mild mechanical popping of L scapula with shoulder abduction. Mild decrease in active extension of the spine however forward flexion is intact. No limitations in rotational movements of thorax. UE strength testing +5/5 in shoulder flexion, +5/5 in shoulder extension without any pain elicited in these movements. Negative Spurlings test.  Neurological: He exhibits normal muscle tone.  Ambulating without assisted devices  Skin: Skin is  warm. No rash noted.  Nursing note and vitals reviewed.         Assessment & Plan:  Chronic thoracic back pain 2/2 DDD and myofascial spasms - improving  Given the patient's reported history and physical exam, I do believe that he is improving with his home scapular exercises and back brace. I would now like to progress his activity to utilizing low resistance in scapular protraction/retraction. He may start with using 1-3lb weights performing these exercises at least daily. If at any point he has increasing pain or decreased ROM, then he should stop using the resistance and may schedule an appointment for reevaluation. He may continue to utilize Ibuprofen as needed for pain. I am recommending 3-6 month follow up. Patient agreeable to this plan.

## 2017-02-02 ENCOUNTER — Ambulatory Visit: Payer: Medicare Other | Admitting: Sports Medicine

## 2017-04-09 ENCOUNTER — Inpatient Hospital Stay (HOSPITAL_COMMUNITY)
Admission: EM | Admit: 2017-04-09 | Discharge: 2017-04-12 | DRG: 872 | Disposition: A | Discharge status: Home / Self Care | Payer: Medicare Other | Attending: Internal Medicine | Admitting: Internal Medicine

## 2017-04-09 ENCOUNTER — Encounter (HOSPITAL_COMMUNITY): Payer: Self-pay | Admitting: Emergency Medicine

## 2017-04-09 ENCOUNTER — Emergency Department (HOSPITAL_COMMUNITY): Payer: Medicare Other

## 2017-04-09 DIAGNOSIS — R531 Weakness: Secondary | ICD-10-CM | POA: Diagnosis not present

## 2017-04-09 DIAGNOSIS — B9620 Unspecified Escherichia coli [E. coli] as the cause of diseases classified elsewhere: Secondary | ICD-10-CM

## 2017-04-09 DIAGNOSIS — A4151 Sepsis due to Escherichia coli [E. coli]: Principal | ICD-10-CM | POA: Diagnosis present

## 2017-04-09 DIAGNOSIS — I10 Essential (primary) hypertension: Secondary | ICD-10-CM | POA: Diagnosis not present

## 2017-04-09 DIAGNOSIS — R0602 Shortness of breath: Secondary | ICD-10-CM | POA: Diagnosis not present

## 2017-04-09 DIAGNOSIS — M810 Age-related osteoporosis without current pathological fracture: Secondary | ICD-10-CM | POA: Diagnosis present

## 2017-04-09 DIAGNOSIS — M109 Gout, unspecified: Secondary | ICD-10-CM | POA: Diagnosis present

## 2017-04-09 DIAGNOSIS — N3000 Acute cystitis without hematuria: Secondary | ICD-10-CM | POA: Diagnosis not present

## 2017-04-09 DIAGNOSIS — Z87440 Personal history of urinary (tract) infections: Secondary | ICD-10-CM

## 2017-04-09 DIAGNOSIS — E119 Type 2 diabetes mellitus without complications: Secondary | ICD-10-CM | POA: Diagnosis not present

## 2017-04-09 DIAGNOSIS — N400 Enlarged prostate without lower urinary tract symptoms: Secondary | ICD-10-CM | POA: Diagnosis not present

## 2017-04-09 DIAGNOSIS — Z881 Allergy status to other antibiotic agents status: Secondary | ICD-10-CM

## 2017-04-09 DIAGNOSIS — N390 Urinary tract infection, site not specified: Secondary | ICD-10-CM | POA: Diagnosis present

## 2017-04-09 DIAGNOSIS — A419 Sepsis, unspecified organism: Secondary | ICD-10-CM | POA: Diagnosis not present

## 2017-04-09 DIAGNOSIS — R7881 Bacteremia: Secondary | ICD-10-CM

## 2017-04-09 DIAGNOSIS — Z8249 Family history of ischemic heart disease and other diseases of the circulatory system: Secondary | ICD-10-CM

## 2017-04-09 DIAGNOSIS — Z1623 Resistance to quinolones and fluoroquinolones: Secondary | ICD-10-CM | POA: Diagnosis not present

## 2017-04-09 DIAGNOSIS — Z79899 Other long term (current) drug therapy: Secondary | ICD-10-CM

## 2017-04-09 DIAGNOSIS — Z7984 Long term (current) use of oral hypoglycemic drugs: Secondary | ICD-10-CM

## 2017-04-09 DIAGNOSIS — Z880 Allergy status to penicillin: Secondary | ICD-10-CM

## 2017-04-09 DIAGNOSIS — Z87891 Personal history of nicotine dependence: Secondary | ICD-10-CM

## 2017-04-09 DIAGNOSIS — E118 Type 2 diabetes mellitus with unspecified complications: Secondary | ICD-10-CM

## 2017-04-09 DIAGNOSIS — R3912 Poor urinary stream: Secondary | ICD-10-CM | POA: Diagnosis present

## 2017-04-09 DIAGNOSIS — R112 Nausea with vomiting, unspecified: Secondary | ICD-10-CM | POA: Diagnosis not present

## 2017-04-09 DIAGNOSIS — R404 Transient alteration of awareness: Secondary | ICD-10-CM | POA: Diagnosis not present

## 2017-04-09 LAB — URINALYSIS, ROUTINE W REFLEX MICROSCOPIC
BILIRUBIN URINE: NEGATIVE
Glucose, UA: NEGATIVE mg/dL
Ketones, ur: NEGATIVE mg/dL
NITRITE: POSITIVE — AB
PROTEIN: NEGATIVE mg/dL
SQUAMOUS EPITHELIAL / LPF: NONE SEEN
Specific Gravity, Urine: 1.012 (ref 1.005–1.030)
pH: 6 (ref 5.0–8.0)

## 2017-04-09 LAB — COMPREHENSIVE METABOLIC PANEL
ALBUMIN: 3.7 g/dL (ref 3.5–5.0)
ALT: 30 U/L (ref 17–63)
ANION GAP: 9 (ref 5–15)
AST: 32 U/L (ref 15–41)
Alkaline Phosphatase: 69 U/L (ref 38–126)
BILIRUBIN TOTAL: 1.2 mg/dL (ref 0.3–1.2)
BUN: 13 mg/dL (ref 6–20)
CHLORIDE: 104 mmol/L (ref 101–111)
CO2: 24 mmol/L (ref 22–32)
Calcium: 8.8 mg/dL — ABNORMAL LOW (ref 8.9–10.3)
Creatinine, Ser: 1.07 mg/dL (ref 0.61–1.24)
GFR calc Af Amer: >60 mL/min (ref >60)
GFR calc non Af Amer: >60 mL/min (ref >60)
Glucose, Bld: 135 mg/dL — ABNORMAL HIGH (ref 65–99)
POTASSIUM: 3.4 mmol/L — AB (ref 3.5–5.1)
SODIUM: 137 mmol/L (ref 135–145)
TOTAL PROTEIN: 7.1 g/dL (ref 6.5–8.1)

## 2017-04-09 LAB — CBC WITH DIFFERENTIAL/PLATELET
BASOS PCT: 0 %
Basophils Absolute: 0 10*3/uL (ref 0.0–0.1)
Eosinophils Absolute: 0 10*3/uL (ref 0.0–0.7)
Eosinophils Relative: 0 %
HEMATOCRIT: 44 % (ref 39.0–52.0)
HEMOGLOBIN: 14.7 g/dL (ref 13.0–17.0)
LYMPHS PCT: 6 %
Lymphs Abs: 0.5 10*3/uL — ABNORMAL LOW (ref 0.7–4.0)
MCH: 31.8 pg (ref 26.0–34.0)
MCHC: 33.4 g/dL (ref 30.0–36.0)
MCV: 95.2 fL (ref 78.0–100.0)
MONO ABS: 0.9 10*3/uL (ref 0.1–1.0)
MONOS PCT: 11 %
NEUTROS ABS: 6.7 10*3/uL (ref 1.7–7.7)
NEUTROS PCT: 83 %
Platelets: 205 10*3/uL (ref 150–400)
RBC: 4.62 MIL/uL (ref 4.22–5.81)
RDW: 13.6 % (ref 11.5–15.5)
WBC: 8.1 10*3/uL (ref 4.0–10.5)

## 2017-04-09 LAB — I-STAT CG4 LACTIC ACID, ED: Lactic Acid, Venous: 1.76 mmol/L (ref 0.5–1.9)

## 2017-04-09 LAB — PROTIME-INR
INR: 1.21
PROTHROMBIN TIME: 15.2 s (ref 11.4–15.2)

## 2017-04-09 MED ORDER — LEVOFLOXACIN IN D5W 750 MG/150ML IV SOLN
750.0000 mg | Freq: Once | INTRAVENOUS | Status: AC
  Administered 2017-04-09: 750 mg via INTRAVENOUS
  Filled 2017-04-09: qty 150

## 2017-04-09 MED ORDER — DEXTROSE 5 % IV SOLN
2.0000 g | Freq: Once | INTRAVENOUS | Status: AC
  Administered 2017-04-09: 2 g via INTRAVENOUS
  Filled 2017-04-09: qty 2

## 2017-04-09 MED ORDER — ACETAMINOPHEN 325 MG PO TABS
650.0000 mg | ORAL_TABLET | Freq: Once | ORAL | Status: AC
  Administered 2017-04-09: 650 mg via ORAL
  Filled 2017-04-09: qty 2

## 2017-04-09 MED ORDER — SODIUM CHLORIDE 0.9 % IV BOLUS (SEPSIS)
1000.0000 mL | Freq: Once | INTRAVENOUS | Status: AC
  Administered 2017-04-09: 1000 mL via INTRAVENOUS

## 2017-04-09 NOTE — Progress Notes (Signed)
A consult was received from an ED physician for aztreonam, levofloxacin per pharmacy dosing.  The patient's profile has been reviewed for ht/wt/allergies/indication/available labs.   A one time order has been placed for Aztreonam 2gm iv, levofloxacin 750mg  iv.  Further antibiotics/pharmacy consults should be ordered by admitting physician if indicated.                       Thank you, Aleene DavidsonGrimsley Jr, Roman Sandall Crowford 04/09/2017  10:44 PM

## 2017-04-09 NOTE — ED Notes (Signed)
Patient transported to X-ray 

## 2017-04-09 NOTE — ED Notes (Signed)
Bed: [id number] Expected date:  Expected time:  Means of arrival:  Comments: EMS 76 yo male syncope/hot to touch 89% RA IV/fluid bolus Zofran 4 mg for nausea

## 2017-04-09 NOTE — ED Triage Notes (Signed)
Per EMS , pt. From home reported of near syncopal episode with SOB, pt. Also reported of fever and fatigue which started at 1:30pm this afternoon. Also reported of N/V. Denied diarrhea. Pt. Negative on  stroke screen per EMS. Alert and oriented x4. Received 1L of NS and 4mg  IV zofran via EMS. On 3 L/min per [place] with 96% O2 sat. Upon arrival to ED. Denied chest pain.

## 2017-04-09 NOTE — ED Provider Notes (Signed)
[facility]-EMERGENCY DEPT Provider Note   CSN: [id number] Arrival date & time: 04/09/17  2150   History   Chief Complaint Chief Complaint  Patient presents with  . Near Syncope  . Fever  . Shortness of Breath    HPI [name] Steele is a 76 y.o. male.  HPI Patient presents to the emergency room for evaluation of normalized weakness and fever.  Patient charted feeling very fatigued and weak this afternoon.  He felt feverish.  He had a couple episodes of nausea and vomiting but no diarrhea.  He denies any abdominal pain.  He has noticed that he has been urinating very frequently.  No cough no rashes.  No headache or neck pain.  He called EMS and they noted that he had a temperature of 103.  Patient was given 4 mg of Zofran. Past Medical History:  Diagnosis Date  . Arthritis   . Diabetes mellitus without complication (HCC)   . Gout   . Hypertension   . Osteoporosis     There are no active problems to display for this patient.   Past Surgical History:  Procedure Laterality Date  . JOINT REPLACEMENT Bilateral        Home Medications    Prior to Admission medications   Medication Sig Start Date End Date Taking? Authorizing Provider  amLODipine (NORVASC) 10 MG tablet Take 10 mg by mouth daily.   Yes [provider]  colchicine 0.6 MG tablet Take 0.6 mg by mouth daily.   Yes [provider]  glipiZIDE-metformin (METAGLIP) 2.5-500 MG per tablet Take 1 tablet by mouth 2 (two) times daily before a meal.   Yes [provider]  telmisartan (MICARDIS) 80 MG tablet Take 80 mg by mouth daily.   Yes [provider]  allopurinol (ZYLOPRIM) 100 MG tablet Take 1 tablet (100 mg total) by mouth daily. Patient not taking: Reported on 12/14/2016 05/06/14   Elvina SidleLauenstein, Kurt, MD    Family History Family History  Problem Relation Age of Onset  . Heart disease Sister     Social History Social History   Tobacco Use  . Smoking  status: Former Games developermoker  . Smokeless tobacco: Never Used  Substance Use Topics  . Alcohol use: Yes    Alcohol/week: 0.0 oz    Comment: social  . Drug use: No     Allergies   Erythromycin and Penicillins   Review of Systems Review of Systems  All other systems reviewed and are negative.    Physical Exam Updated Vital Signs BP (!) 146/66 (BP Location: Right Arm)   Pulse 91   Temp (!) 103.4 F (39.7 C) (Rectal)   Resp (!) 21   SpO2 97%   Physical Exam  Constitutional: He appears ill. No distress.  HENT:  Head: Normocephalic and atraumatic.  Right Ear: External ear normal.  Left Ear: External ear normal.  Eyes: Conjunctivae are normal. Right eye exhibits no discharge. Left eye exhibits no discharge. No scleral icterus.  Neck: Neck supple. No tracheal deviation present.  Cardiovascular: Normal rate, regular rhythm and intact distal pulses.  Pulmonary/Chest: Effort normal and breath sounds normal. No stridor. No respiratory distress. He has no decreased breath sounds. He has no wheezes. He has no rales.  Abdominal: Soft. Bowel sounds are normal. He exhibits no distension. There is no tenderness. There is no rebound and no guarding.  Musculoskeletal: He exhibits no edema or tenderness.  Neurological: He is alert. He has normal strength. No cranial  nerve deficit (no facial droop, extraocular movements intact, no slurred speech) or sensory deficit. He exhibits normal muscle tone. He displays no seizure activity. Coordination normal.  Skin: Skin is warm and dry. No rash noted. He is not diaphoretic. No erythema.  Psychiatric: He has a normal mood and affect.  Nursing note and vitals reviewed.    ED Treatments / Results  Labs (all labs ordered are listed, but only abnormal results are displayed) Labs Reviewed  COMPREHENSIVE METABOLIC PANEL - Abnormal; Notable for the following components:      Result Value   Potassium 3.4 (*)    Glucose, Bld 135 (*)    Calcium 8.8 (*)     All other components within normal limits  CBC WITH DIFFERENTIAL/PLATELET - Abnormal; Notable for the following components:   Lymphs Abs 0.5 (*)    All other components within normal limits  URINALYSIS, ROUTINE W REFLEX MICROSCOPIC - Abnormal; Notable for the following components:   Color, Urine AMBER (*)    Hgb urine dipstick SMALL (*)    Nitrite POSITIVE (*)    Leukocytes, UA LARGE (*)    Bacteria, UA MANY (*)    All other components within normal limits  CULTURE, BLOOD (ROUTINE X 2)  CULTURE, BLOOD (ROUTINE X 2)  URINE CULTURE  PROTIME-INR  I-STAT CG4 LACTIC ACID, ED  I-STAT CG4 LACTIC ACID, ED      Radiology Dg Chest 2 View  Result Date: 04/09/2017 CLINICAL DATA:  Shortness of breath.  Previous smoker. EXAM: CHEST  2 VIEW COMPARISON:  05/05/2014 FINDINGS: Shallow inspiration. Focal infiltration or atelectasis in the left lung base. No blunting of costophrenic angles. No pneumothorax. Mediastinal contours appear intact. There appears to be an old impaction fracture deformity of the left proximal humerus. IMPRESSION: Shallow inspiration with linear atelectasis or infiltration in the left lung base. Electronically Signed   By: Burman Nieves M.D.   On: 04/09/2017 23:14    Procedures .Critical Care Performed by: Linwood Dibbles, MD Authorized by: Linwood Dibbles, MD   Critical care provider statement:    Critical care time (minutes):  30   Critical care was time spent personally by me on the following activities:  Discussions with consultants, evaluation of patient's response to treatment, examination of patient, ordering and performing treatments and interventions, ordering and review of laboratory studies, ordering and review of radiographic studies, pulse oximetry, re-evaluation of patient's condition, obtaining history from patient or surrogate and review of old charts   (including critical care time)  Medications Ordered in ED Medications  sodium chloride 0.9 % bolus 1,000 mL  (1,000 mLs Intravenous New Bag/Given 04/09/17 2308)    And  sodium chloride 0.9 % bolus 1,000 mL (0 mLs Intravenous Stopped 04/09/17 2250)    And  sodium chloride 0.9 % bolus 1,000 mL (not administered)  levofloxacin (LEVAQUIN) IVPB 750 mg (750 mg Intravenous New Bag/Given 04/09/17 2324)  aztreonam (AZACTAM) 2 g in dextrose 5 % 50 mL IVPB (0 g Intravenous Stopped 04/09/17 2324)  acetaminophen (TYLENOL) tablet 650 mg (650 mg Oral Given 04/09/17 2312)     Initial Impression / Assessment and Plan / ED Course  I have reviewed the triage vital signs and the nursing notes.  Pertinent labs & imaging results that were available during my care of the patient were reviewed by me and considered in my medical decision making (see chart for details).   Patient presented to the emergency room with complaints of fever and weakness.  Patient was noted  to be tachycardic.  He was started on sepsis protocol.  Fortunately his lactic acid levels not elevated and his blood pressure remained stable.  His urinalysis is consistent with a urinary tract infection I suspect he has a component of early urosepsis.  Have started patient on IV antibiotics.  I will consult the medical service for admission and further treatment.  Final Clinical Impressions(s) / ED Diagnoses   Final diagnoses:  Sepsis, due to unspecified organism (HCC)  Acute cystitis without hematuria      Linwood DibblesKnapp, Alter Moss, MD 04/09/17 2352

## 2017-04-10 ENCOUNTER — Other Ambulatory Visit: Payer: Self-pay

## 2017-04-10 DIAGNOSIS — A419 Sepsis, unspecified organism: Secondary | ICD-10-CM | POA: Diagnosis present

## 2017-04-10 DIAGNOSIS — Z87891 Personal history of nicotine dependence: Secondary | ICD-10-CM | POA: Diagnosis not present

## 2017-04-10 DIAGNOSIS — Z881 Allergy status to other antibiotic agents status: Secondary | ICD-10-CM | POA: Diagnosis not present

## 2017-04-10 DIAGNOSIS — N3000 Acute cystitis without hematuria: Secondary | ICD-10-CM

## 2017-04-10 DIAGNOSIS — Z880 Allergy status to penicillin: Secondary | ICD-10-CM | POA: Diagnosis not present

## 2017-04-10 DIAGNOSIS — E118 Type 2 diabetes mellitus with unspecified complications: Secondary | ICD-10-CM

## 2017-04-10 DIAGNOSIS — Z8249 Family history of ischemic heart disease and other diseases of the circulatory system: Secondary | ICD-10-CM | POA: Diagnosis not present

## 2017-04-10 DIAGNOSIS — M109 Gout, unspecified: Secondary | ICD-10-CM | POA: Diagnosis present

## 2017-04-10 DIAGNOSIS — Z794 Long term (current) use of insulin: Secondary | ICD-10-CM | POA: Diagnosis not present

## 2017-04-10 DIAGNOSIS — I10 Essential (primary) hypertension: Secondary | ICD-10-CM | POA: Diagnosis not present

## 2017-04-10 DIAGNOSIS — N390 Urinary tract infection, site not specified: Secondary | ICD-10-CM | POA: Diagnosis present

## 2017-04-10 DIAGNOSIS — A4151 Sepsis due to Escherichia coli [E. coli]: Secondary | ICD-10-CM | POA: Diagnosis not present

## 2017-04-10 DIAGNOSIS — R3912 Poor urinary stream: Secondary | ICD-10-CM | POA: Diagnosis present

## 2017-04-10 DIAGNOSIS — E119 Type 2 diabetes mellitus without complications: Secondary | ICD-10-CM | POA: Diagnosis not present

## 2017-04-10 DIAGNOSIS — Z7984 Long term (current) use of oral hypoglycemic drugs: Secondary | ICD-10-CM | POA: Diagnosis not present

## 2017-04-10 DIAGNOSIS — R7881 Bacteremia: Secondary | ICD-10-CM | POA: Diagnosis not present

## 2017-04-10 DIAGNOSIS — N400 Enlarged prostate without lower urinary tract symptoms: Secondary | ICD-10-CM | POA: Diagnosis present

## 2017-04-10 DIAGNOSIS — Z87440 Personal history of urinary (tract) infections: Secondary | ICD-10-CM | POA: Diagnosis not present

## 2017-04-10 DIAGNOSIS — M810 Age-related osteoporosis without current pathological fracture: Secondary | ICD-10-CM | POA: Diagnosis present

## 2017-04-10 DIAGNOSIS — Z79899 Other long term (current) drug therapy: Secondary | ICD-10-CM | POA: Diagnosis not present

## 2017-04-10 DIAGNOSIS — Z1623 Resistance to quinolones and fluoroquinolones: Secondary | ICD-10-CM | POA: Diagnosis present

## 2017-04-10 LAB — BLOOD CULTURE ID PANEL (REFLEXED)
ACINETOBACTER BAUMANNII: NOT DETECTED
CANDIDA ALBICANS: NOT DETECTED
CANDIDA PARAPSILOSIS: NOT DETECTED
Candida glabrata: NOT DETECTED
Candida krusei: NOT DETECTED
Candida tropicalis: NOT DETECTED
Carbapenem resistance: NOT DETECTED
ENTEROBACTER CLOACAE COMPLEX: NOT DETECTED
ENTEROBACTERIACEAE SPECIES: DETECTED — AB
Enterococcus species: NOT DETECTED
Escherichia coli: DETECTED — AB
HAEMOPHILUS INFLUENZAE: NOT DETECTED
KLEBSIELLA PNEUMONIAE: NOT DETECTED
Klebsiella oxytoca: NOT DETECTED
LISTERIA MONOCYTOGENES: NOT DETECTED
Neisseria meningitidis: NOT DETECTED
PSEUDOMONAS AERUGINOSA: NOT DETECTED
Proteus species: NOT DETECTED
STREPTOCOCCUS AGALACTIAE: NOT DETECTED
STREPTOCOCCUS PNEUMONIAE: NOT DETECTED
STREPTOCOCCUS PYOGENES: NOT DETECTED
STREPTOCOCCUS SPECIES: NOT DETECTED
Serratia marcescens: NOT DETECTED
Staphylococcus aureus (BCID): NOT DETECTED
Staphylococcus species: NOT DETECTED

## 2017-04-10 LAB — CBC
HCT: 39.9 % (ref 39.0–52.0)
Hemoglobin: 12.7 g/dL — ABNORMAL LOW (ref 13.0–17.0)
MCH: 31 pg (ref 26.0–34.0)
MCHC: 31.8 g/dL (ref 30.0–36.0)
MCV: 97.3 fL (ref 78.0–100.0)
PLATELETS: 174 10*3/uL (ref 150–400)
RBC: 4.1 MIL/uL — AB (ref 4.22–5.81)
RDW: 13.9 % (ref 11.5–15.5)
WBC: 5.8 10*3/uL (ref 4.0–10.5)

## 2017-04-10 LAB — COMPREHENSIVE METABOLIC PANEL
ALK PHOS: 53 U/L (ref 38–126)
ALT: 25 U/L (ref 17–63)
AST: 24 U/L (ref 15–41)
Albumin: 3.1 g/dL — ABNORMAL LOW (ref 3.5–5.0)
Anion gap: 8 (ref 5–15)
BILIRUBIN TOTAL: 0.7 mg/dL (ref 0.3–1.2)
BUN: 12 mg/dL (ref 6–20)
CALCIUM: 8.1 mg/dL — AB (ref 8.9–10.3)
CO2: 25 mmol/L (ref 22–32)
CREATININE: 1.12 mg/dL (ref 0.61–1.24)
Chloride: 106 mmol/L (ref 101–111)
Glucose, Bld: 158 mg/dL — ABNORMAL HIGH (ref 65–99)
Potassium: 3.7 mmol/L (ref 3.5–5.1)
Sodium: 139 mmol/L (ref 135–145)
TOTAL PROTEIN: 5.8 g/dL — AB (ref 6.5–8.1)

## 2017-04-10 LAB — GLUCOSE, CAPILLARY
GLUCOSE-CAPILLARY: 111 mg/dL — AB (ref 65–99)
Glucose-Capillary: 117 mg/dL — ABNORMAL HIGH (ref 65–99)
Glucose-Capillary: 134 mg/dL — ABNORMAL HIGH (ref 65–99)
Glucose-Capillary: 103 mg/dL — ABNORMAL HIGH (ref 65–99)
Glucose-Capillary: 98 mg/dL (ref 65–99)

## 2017-04-10 MED ORDER — ONDANSETRON HCL 4 MG/2ML IJ SOLN: 4.0000 mg | Freq: Four times a day (QID) | INTRAMUSCULAR | Status: DC | PRN

## 2017-04-10 MED ORDER — COLCHICINE 0.6 MG PO TABS
0.6000 mg | ORAL_TABLET | Freq: Every day | ORAL | Status: DC
Start: 2017-04-10 — End: 2017-04-12
  Administered 2017-04-10 – 2017-04-12 (×3): 0.6 mg via ORAL
  Filled 2017-04-10 (×3): qty 1

## 2017-04-10 MED ORDER — DEXTROSE 5 % IV SOLN
2.0000 g | INTRAVENOUS | Status: DC
  Administered 2017-04-10 – 2017-04-11 (×2): 2 g via INTRAVENOUS
  Filled 2017-04-10 (×3): qty 2

## 2017-04-10 MED ORDER — ACETAMINOPHEN 325 MG PO TABS
650.0000 mg | ORAL_TABLET | Freq: Four times a day (QID) | ORAL | Status: DC | PRN
  Administered 2017-04-10: 21:00:00 650 mg via ORAL
  Filled 2017-04-10: qty 2

## 2017-04-10 MED ORDER — INSULIN ASPART 100 UNIT/ML ~~LOC~~ SOLN
0.0000 [IU] | Freq: Three times a day (TID) | SUBCUTANEOUS | Status: DC
  Administered 2017-04-10: 2 [IU] via SUBCUTANEOUS

## 2017-04-10 MED ORDER — AMLODIPINE BESYLATE 10 MG PO TABS
10.0000 mg | ORAL_TABLET | Freq: Every day | ORAL | Status: DC
  Administered 2017-04-10 – 2017-04-12 (×3): 10 mg via ORAL
  Filled 2017-04-10: qty 1
  Filled 2017-04-10 (×2): qty 2

## 2017-04-10 MED ORDER — POTASSIUM CHLORIDE CRYS ER 20 MEQ PO TBCR
20.0000 meq | EXTENDED_RELEASE_TABLET | Freq: Once | ORAL | Status: AC
Start: 2017-04-10 — End: 2017-04-10
  Administered 2017-04-10: 04:00:00 20 meq via ORAL
  Filled 2017-04-10: qty 1

## 2017-04-10 MED ORDER — INSULIN ASPART 100 UNIT/ML ~~LOC~~ SOLN: 0.0000 [IU] | Freq: Three times a day (TID) | SUBCUTANEOUS | Status: DC

## 2017-04-10 MED ORDER — AZTREONAM IN DEXTROSE 1 GM/50ML IV SOLN
1.0000 g | Freq: Four times a day (QID) | INTRAVENOUS | Status: DC
  Filled 2017-04-10: qty 50

## 2017-04-10 MED ORDER — TAMSULOSIN HCL 0.4 MG PO CAPS
0.4000 mg | ORAL_CAPSULE | Freq: Every day | ORAL | Status: DC
  Administered 2017-04-10 – 2017-04-11 (×2): 0.4 mg via ORAL
  Filled 2017-04-10 (×2): qty 1

## 2017-04-10 MED ORDER — DEXTROSE 5 % IV SOLN
1.0000 g | Freq: Three times a day (TID) | INTRAVENOUS | Status: DC
  Administered 2017-04-10: 1 g via INTRAVENOUS
  Filled 2017-04-10 (×2): qty 1

## 2017-04-10 MED ORDER — ONDANSETRON HCL 4 MG PO TABS: 4.0000 mg | ORAL_TABLET | Freq: Four times a day (QID) | ORAL | Status: DC | PRN

## 2017-04-10 MED ORDER — ENOXAPARIN SODIUM 40 MG/0.4ML ~~LOC~~ SOLN
40.0000 mg | SUBCUTANEOUS | Status: DC
  Administered 2017-04-10 – 2017-04-12 (×3): 40 mg via SUBCUTANEOUS
  Filled 2017-04-10 (×3): qty 0.4

## 2017-04-10 MED ORDER — INSULIN ASPART 100 UNIT/ML ~~LOC~~ SOLN
4.0000 [IU] | Freq: Three times a day (TID) | SUBCUTANEOUS | Status: DC
  Administered 2017-04-10 – 2017-04-12 (×4): 4 [IU] via SUBCUTANEOUS

## 2017-04-10 MED ORDER — INSULIN ASPART 100 UNIT/ML ~~LOC~~ SOLN: 0.0000 [IU] | Freq: Every day | SUBCUTANEOUS | Status: DC

## 2017-04-10 MED ORDER — IRBESARTAN 150 MG PO TABS
300.0000 mg | ORAL_TABLET | Freq: Every day | ORAL | Status: DC
  Administered 2017-04-10 – 2017-04-12 (×3): 300 mg via ORAL
  Filled 2017-04-10 (×3): qty 2

## 2017-04-10 MED ORDER — SODIUM CHLORIDE 0.9 % IV SOLN
INTRAVENOUS | Status: DC
  Administered 2017-04-10 (×2): via INTRAVENOUS

## 2017-04-10 MED ORDER — ACETAMINOPHEN 650 MG RE SUPP: 650.0000 mg | Freq: Four times a day (QID) | RECTAL | Status: DC | PRN

## 2017-04-10 NOTE — Progress Notes (Signed)
Pharmacy Antibiotic Note  [name] Steele is a 76 y.o. male admitted on 04/09/2017 with UTI.  Pharmacy has been consulted for Aztreonam dosing.  Plan: Aztreonam 1gm iv q8hr  Height: 5\' 11"  (180.3 cm) Weight: 200 lb 13.4 oz (91.1 kg) IBW/kg (Calculated) : 75.3  Temp (24hrs), Avg:100.4 F (38 C), Min:98.7 F (37.1 C), Max:103.4 F (39.7 C)  Recent Labs  Lab 04/09/17 2215 04/09/17 2238  WBC 8.1  --   CREATININE 1.07  --   LATICACIDVEN  --  1.76    Estimated Creatinine Clearance: 67.8 mL/min (by C-G formula based on SCr of 1.07 mg/dL).    Allergies  Allergen Reactions  . Erythromycin Other (See Comments)    Severe Abdominal Pain  . Penicillins Hives    Has patient had a PCN reaction causing immediate rash, facial/tongue/throat swelling, SOB or lightheadedness with hypotension:  Yes  Has patient had a PCN reaction causing severe rash involving mucus membranes or skin necrosis:  No Has patient had a PCN reaction that required hospitalization: No Has patient had a PCN reaction occurring within the last 10 years: No If all of the above answers are "NO", then may proceed with Cephalosporin use.     Antimicrobials this admission: Aztreonam 04/09/2017 >> Levofloxacin 04/09/2017 x1  Dose adjustments this admission: -  Microbiology results: pending  Thank you for allowing pharmacy to be a part of this patient's care.  Aleene DavidsonGrimsley Jr, Caileen Veracruz Crowford 04/10/2017 3:39 AM

## 2017-04-10 NOTE — Progress Notes (Signed)
PHARMACY - PHYSICIAN COMMUNICATION CRITICAL VALUE ALERT - BLOOD CULTURE IDENTIFICATION (BCID)  Results for orders placed or performed during the hospital encounter of 04/09/17  Blood Culture ID Panel (Reflexed) (Collected: 04/09/2017 10:30 PM)  Result Value Ref Range   Enterococcus species NOT DETECTED NOT DETECTED   Listeria monocytogenes NOT DETECTED NOT DETECTED   Staphylococcus species NOT DETECTED NOT DETECTED   Staphylococcus aureus NOT DETECTED NOT DETECTED   Streptococcus species NOT DETECTED NOT DETECTED   Streptococcus agalactiae NOT DETECTED NOT DETECTED   Streptococcus pneumoniae NOT DETECTED NOT DETECTED   Streptococcus pyogenes NOT DETECTED NOT DETECTED   Acinetobacter baumannii NOT DETECTED NOT DETECTED   Enterobacteriaceae species DETECTED (A) NOT DETECTED   Enterobacter cloacae complex NOT DETECTED NOT DETECTED   Escherichia coli DETECTED (A) NOT DETECTED   Klebsiella oxytoca NOT DETECTED NOT DETECTED   Klebsiella pneumoniae NOT DETECTED NOT DETECTED   Proteus species NOT DETECTED NOT DETECTED   Serratia marcescens NOT DETECTED NOT DETECTED   Carbapenem resistance NOT DETECTED NOT DETECTED   Haemophilus influenzae NOT DETECTED NOT DETECTED   Neisseria meningitidis NOT DETECTED NOT DETECTED   Pseudomonas aeruginosa NOT DETECTED NOT DETECTED   Candida albicans NOT DETECTED NOT DETECTED   Candida glabrata NOT DETECTED NOT DETECTED   Candida krusei NOT DETECTED NOT DETECTED   Candida parapsilosis NOT DETECTED NOT DETECTED   Candida tropicalis NOT DETECTED NOT DETECTED    Name of physician (or Provider) Contacted: Dr. Radonna RickerFeliz  Changes to prescribed antibiotics required: continue ceftriaxone 2gm IV q24h  Lucia Gaskinsham, Valeriano Bain P 04/10/2017  2:29 PM

## 2017-04-10 NOTE — Progress Notes (Signed)
[facility] PROGRESS NOTE    Progress Note  [name] Steele  ZOX:[id number] DOB: 04/28/1941 DOA: 04/09/2017 PCP: Merri BrunettePharr, Walter, MD     Brief Narrative:   [name] Steele is an 76 y.o. male past medical history of gout diabetes mellitus comes in with chief complaint of fatigue and fever, frequency with urination, temperature was checked in the ED was 103.4  Assessment/Plan:   Sepsis (HCC)/UTI (urinary tract infection) Has defervesced, he was started empirically on IV Levaquin and aztreonam. Will DC the start IV Rocephin has remained afebrile culture data is pending.  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 We will start him on Flomax empirically.  He will need to follow-up with urologist as an outpatient.  Diabetes mellitus type 2: Agree with holding oral hypoglycemic agents, A1c is pending.  DC current sliding scale put him on sliding scale moderate scale.  Gout: Asymptomatic continue colchicine.  Essential hypertension: Continue current medications blood pressure stable.    DVT prophylaxis: lovenox Family Communication:none Disposition Plan/Barrier to D/C: home in 1 day Code Status:     Code Status Orders  (From admission, onward)        Start     Ordered   04/10/17 0302  Full code  Continuous     04/10/17 0301    Code Status History    Date Active Date Inactive Code Status Order ID Comments User Context   This patient has a current code status but no historical code status.        IV Access:    Peripheral IV   Procedures and diagnostic studies:   Dg Chest 2 View  Result Date: 04/09/2017 CLINICAL DATA:  Shortness of breath.  Previous smoker. EXAM: CHEST  2 VIEW COMPARISON:  05/05/2014 FINDINGS: Shallow inspiration. Focal infiltration or atelectasis in the left lung base. No blunting of costophrenic angles. No pneumothorax. Mediastinal contours appear intact. There appears to be an old  impaction fracture deformity of the left proximal humerus. IMPRESSION: Shallow inspiration with linear atelectasis or infiltration in the left lung base. Electronically Signed   By: Burman NievesWilliam  Stevens M.D.   On: 04/09/2017 23:14     Medical Consultants:    None.  Anti-Infectives:   rocephin  Subjective:    [name] Steele he relates he feels better than yesterday has remained afebrile.  Objective:    Vitals:   04/10/17 0130 04/10/17 0223 04/10/17 0300 04/10/17 0329  BP: 115/67 120/73  126/65  Pulse: 81 80  68  Resp: 19 18    Temp:  99 F (37.2 C)  98.7 F (37.1 C)  TempSrc:  Oral  Oral  SpO2: 96% 96%  94%  Weight:   91.1 kg (200 lb 13.4 oz)   Height:   5\' 11"  (1.803 m)     Intake/Output Summary (Last 24 hours) at 04/10/2017 0942 Last data filed at 04/10/2017 0820 Gross per 24 hour  Intake 6268.75 ml  Output 575 ml  Net 5693.75 ml   Filed Weights   04/10/17 0300  Weight: 91.1 kg (200 lb 13.4 oz)    Exam: General exam: In no acute distress. Respiratory system: Good air movement and clear to auscultation. Cardiovascular system: S1 & S2 heard, RRR.  Gastrointestinal system: Abdomen is nondistended, soft and nontender. No suprapubic tenderness Central nervous system: Alert and oriented. No focal neurological deficits. Extremities: No pedal edema. Skin: No rashes, lesions or ulcers Psychiatry: Judgement and insight appear normal. Mood & affect appropriate.  Data Reviewed:    Labs: Basic Metabolic Panel: Recent Labs  Lab 04/09/17 2215 04/10/17 0542  NA 137 139  K 3.4* 3.7  CL 104 106  CO2 24 25  GLUCOSE 135* 158*  BUN 13 12  CREATININE 1.07 1.12  CALCIUM 8.8* 8.1*   GFR Estimated Creatinine Clearance: 64.8 mL/min (by C-G formula based on SCr of 1.12 mg/dL). Liver Function Tests: Recent Labs  Lab 04/09/17 2215 04/10/17 0542  AST 32 24  ALT 30 25  ALKPHOS 69 53  BILITOT 1.2 0.7  PROT 7.1 5.8*  ALBUMIN 3.7 3.1*   No results for  input(s): LIPASE, AMYLASE in the last 168 hours. No results for input(s): AMMONIA in the last 168 hours. Coagulation profile Recent Labs  Lab 04/09/17 2220  INR 1.21    CBC: Recent Labs  Lab 04/09/17 2215 04/10/17 0542  WBC 8.1 5.8  NEUTROABS 6.7  --   HGB 14.7 12.7*  HCT 44.0 39.9  MCV 95.2 97.3  PLT 205 174   Cardiac Enzymes: No results for input(s): CKTOTAL, CKMB, CKMBINDEX, TROPONINI in the last 168 hours. BNP (last 3 results) No results for input(s): PROBNP in the last 8760 hours. CBG: Recent Labs  Lab 04/10/17 0335 04/10/17 0814  GLUCAP 117* 111*   D-Dimer: No results for input(s): DDIMER in the last 72 hours. Hgb A1c: No results for input(s): HGBA1C in the last 72 hours. Lipid Profile: No results for input(s): CHOL, HDL, LDLCALC, TRIG, CHOLHDL, LDLDIRECT in the last 72 hours. Thyroid function studies: No results for input(s): TSH, T4TOTAL, T3FREE, THYROIDAB in the last 72 hours.  Invalid input(s): FREET3 Anemia work up: No results for input(s): VITAMINB12, FOLATE, FERRITIN, TIBC, IRON, RETICCTPCT in the last 72 hours. Sepsis Labs: Recent Labs  Lab 04/09/17 2215 04/09/17 2238 04/10/17 0542  WBC 8.1  --  5.8  LATICACIDVEN  --  1.76  --    Microbiology No results found for this or any previous visit (from the past 240 hour(s)).   Medications:   . amLODipine  10 mg Oral Daily  . colchicine  0.6 mg Oral Daily  . enoxaparin (LOVENOX) injection  40 mg Subcutaneous Q24H  . insulin aspart  0-9 Units Subcutaneous TID WC  . irbesartan  300 mg Oral Daily   Continuous Infusions: . sodium chloride 75 mL/hr at 04/10/17 0345  . aztreonam        LOS: 0 days   Marinda Elkbraham Feliz Ortiz  [facility] Pager [phone]  *Please refer to [email or web address], password [email or web address] to get updated schedule on who will round on this patient, as hospitalists switch teams weekly. If 7PM-7AM, please contact night-coverage at [email or web address], password [email or web address] for any overnight  needs.  04/10/2017, 9:42 AM

## 2017-04-10 NOTE — H&P (Signed)
TRH H&P    Patient Demographics:    [name] Steele, is a 76 y.o. male  MRN: [id number]  DOB - [birth year]  Admit Date - 04/09/2017  Referring MD/NP/PA: Dr. Lynelle Doctor  Outpatient Primary MD for the patient is [name], MD  Patient coming from: Home  Chief Complaint  Patient presents with  . Near Syncope  . Fever  . Shortness of Breath      HPI:    [name] Steele  is a 76 y.o. male,.. With history of hypertension, gout, diabetes mellitus came to hospital with chief complaint of fatigue and fever.  As per the patient's wife he stepped around 1 PM and then when he woke up around 3 PM he was feeling fatigued and was unable to get up.  He also has been having increased frequency of urination. Denies any cough, or shortness of breath. Denies chest pain Complains of nausea but no vomiting or diarrhea Denies headache  In the ED patient was found to have abnormal UA.  Temperature 103.4 Started on Levaquin and Azactam Patient does have history of recurrent UTIs though he did not have a UTI for past 1 year as per his wife. He is followed by urology as outpatient  No history of stroke or seizures No history of CAD    Review of systems:      All other systems reviewed and are negative.   With Past History of the following :    Past Medical History:  Diagnosis Date  . Arthritis   . Diabetes mellitus without complication (HCC)   . Gout   . Hypertension   . Osteoporosis       Past Surgical History:  Procedure Laterality Date  . JOINT REPLACEMENT Bilateral       Social History:      Social History   Tobacco Use  . Smoking status: Former Games developer  . Smokeless tobacco: Never Used  Substance Use Topics  . Alcohol use: Yes    Alcohol/week: 0.0 oz    Comment: social       Family History :     Family History  Problem Relation Age of Onset  . Heart disease Sister       Home  Medications:   Prior to Admission medications   Medication Sig Start Date End Date Taking? Authorizing Provider  amLODipine (NORVASC) 10 MG tablet Take 10 mg by mouth daily.   Yes [provider]  colchicine 0.6 MG tablet Take 0.6 mg by mouth daily.   Yes [provider]  glipiZIDE-metformin (METAGLIP) 2.5-500 MG per tablet Take 1 tablet by mouth 2 (two) times daily before a meal.   Yes [provider]  telmisartan (MICARDIS) 80 MG tablet Take 80 mg by mouth daily.   Yes [provider]  allopurinol (ZYLOPRIM) 100 MG tablet Take 1 tablet (100 mg total) by mouth daily. Patient not taking: Reported on 12/14/2016 05/06/14   Elvina Sidle, MD     Allergies:     Allergies  Allergen Reactions  . Erythromycin Other (  See Comments)    Severe Abdominal Pain  . Penicillins Hives    Has patient had a PCN reaction causing immediate rash, facial/tongue/throat swelling, SOB or lightheadedness with hypotension:  Yes  Has patient had a PCN reaction causing severe rash involving mucus membranes or skin necrosis:  No Has patient had a PCN reaction that required hospitalization: No Has patient had a PCN reaction occurring within the last 10 years: No If all of the above answers are "NO", then may proceed with Cephalosporin use.      Physical Exam:   Vitals  Blood pressure 115/67, pulse 81, temperature (!) 103.4 F (39.7 C), temperature source Rectal, resp. rate 19, SpO2 96 %.  1.  General: Appears in no acute distress  2. Psychiatric:  Intact judgement and  insight, awake alert, oriented x 3.  3. Neurologic: No focal neurological deficits, all cranial nerves intact.Strength 5/5 all 4 extremities, sensation intact all 4 extremities, plantars down going.  4. Eyes :  anicteric sclerae, moist conjunctivae with no lid lag. PERRLA.  5. ENMT:  Oropharynx clear with moist mucous membranes and good dentition  6. Neck:  supple, no cervical lymphadenopathy  appriciated, No thyromegaly  7. Respiratory : Normal respiratory effort, good air movement bilaterally,clear to  auscultation bilaterally  8. Cardiovascular : RRR, no gallops, rubs or murmurs, no leg edema  9. Gastrointestinal:  Positive bowel sounds, abdomen soft, non-tender to palpation,no hepatosplenomegaly, no rigidity or guarding       10. Skin:  No cyanosis, normal texture and turgor, no rash, lesions or ulcers  11.Musculoskeletal:  Good muscle tone,  joints appear normal , no effusions,  normal range of motion    Data Review:    CBC Recent Labs  Lab 04/09/17 2215  WBC 8.1  HGB 14.7  HCT 44.0  PLT 205  MCV 95.2  MCH 31.8  MCHC 33.4  RDW 13.6  LYMPHSABS 0.5*  MONOABS 0.9  EOSABS 0.0  BASOSABS 0.0   ------------------------------------------------------------------------------------------------------------------  Chemistries  Recent Labs  Lab 04/09/17 2215  NA 137  K 3.4*  CL 104  CO2 24  GLUCOSE 135*  BUN 13  CREATININE 1.07  CALCIUM 8.8*  AST 32  ALT 30  ALKPHOS 69  BILITOT 1.2   ------------------------------------------------------------------------------------------------------------------  ------------------------------------------------------------------------------------------------------------------ GFR: CrCl cannot be calculated (Unknown ideal weight.). Liver Function Tests: Recent Labs  Lab 04/09/17 2215  AST 32  ALT 30  ALKPHOS 69  BILITOT 1.2  PROT 7.1  ALBUMIN 3.7   No results for input(s): LIPASE, AMYLASE in the last 168 hours. No results for input(s): AMMONIA in the last 168 hours. Coagulation Profile: Recent Labs  Lab 04/09/17 2220  INR 1.21    --------------------------------------------------------------------------------------------------------------- Urine analysis:    Component Value Date/Time   COLORURINE AMBER (A) 04/09/2017 2215   APPEARANCEUR CLEAR 04/09/2017 2215   LABSPEC 1.012 04/09/2017 2215    PHURINE 6.0 04/09/2017 2215   GLUCOSEU NEGATIVE 04/09/2017 2215   HGBUR SMALL (A) 04/09/2017 2215   BILIRUBINUR NEGATIVE 04/09/2017 2215   KETONESUR NEGATIVE 04/09/2017 2215   PROTEINUR NEGATIVE 04/09/2017 2215   UROBILINOGEN 0.2 10/06/2008 1745   NITRITE POSITIVE (A) 04/09/2017 2215   LEUKOCYTESUR LARGE (A) 04/09/2017 2215      Imaging Results:    Dg Chest 2 View  Result Date: 04/09/2017 CLINICAL DATA:  Shortness of breath.  Previous smoker. EXAM: CHEST  2 VIEW COMPARISON:  05/05/2014 FINDINGS: Shallow inspiration. Focal infiltration or atelectasis in the left lung base. No blunting of costophrenic  angles. No pneumothorax. Mediastinal contours appear intact. There appears to be an old impaction fracture deformity of the left proximal humerus. IMPRESSION: Shallow inspiration with linear atelectasis or infiltration in the left lung base. Electronically Signed   By: Burman NievesWilliam  Stevens M.D.   On: 04/09/2017 23:14       Assessment & Plan:    Active Problems:   UTI (urinary tract infection)   1. Sepsis due to UTI-patient presented with temperature 103.4, tachycardia, tachypnea with abnormal UA.  Findings consistent with sepsis physiology, started on Levaquin and Azactam.  Lactic acid 1.76.  Will continue with Azactam per pharmacy.  Follow urine culture results.  Chest x-ray shows shallow inspiration with linear atelectasis or infiltration in left lung base. 2. Diabetes mellitus-hold oral hypoglycemic agents, start sliding scale insulin with NovoLog. 3. Gout-continue colchicine, hold allopurinol. 4. Hypertension-blood pressure stable, continue Norvasc   DVT Prophylaxis-   Lovenox   AM Labs Ordered, also please review Full Orders  Family Communication: Admission, patients condition and plan of care including tests being ordered have been discussed with the patient, his wife and son at bedside who indicate understanding and agree with the plan and Code Status.  Code Status: Full  code  Admission status: Observation  Time spent in minutes : 55 minutes   Meredeth IdeGagan S Americus Perkey M.D on 04/10/2017 at 2:18 AM  Between 7am to 7pm - Pager - [phone]. After 7pm go to [email or web address] - password [email or web address]  [facility] - Office  [phone]

## 2017-04-11 DIAGNOSIS — R7881 Bacteremia: Secondary | ICD-10-CM

## 2017-04-11 LAB — GLUCOSE, CAPILLARY
Glucose-Capillary: 100 mg/dL — ABNORMAL HIGH (ref 65–99)
Glucose-Capillary: 97 mg/dL (ref 65–99)
Glucose-Capillary: 102 mg/dL — ABNORMAL HIGH (ref 65–99)
Glucose-Capillary: 152 mg/dL — ABNORMAL HIGH (ref 65–99)

## 2017-04-11 LAB — HEMOGLOBIN A1C
Hgb A1c MFr Bld: 5.6 % (ref 4.8–5.6)
MEAN PLASMA GLUCOSE: 114 mg/dL

## 2017-04-11 NOTE — Evaluation (Signed)
Physical Therapy Evaluation Patient Details Name: Charlena Crossathaniel Blomgren MRN: [id number] DOB: 11/20/1940 Today's Date: 04/11/2017   History of Present Illness   76 y.o. male past medical history of gout diabetes mellitus comes in with chief complaint of fatigue and fever, frequency with urination, temperature was checked in the ED was 103.4. Dx of UTI.  Clinical Impression  Pt is independent with mobility and is ready to DC home from PT standpoint. He ambulated 300' without an assistive device, no loss of balance. PT signing off, no further PT indicated.      Follow Up Recommendations No PT follow up    Equipment Recommendations  None recommended by PT    Recommendations for Other Services       Precautions / Restrictions Precautions Precautions: None Precaution Comments: pt denies h/o falls in past 1 year Restrictions Weight Bearing Restrictions: No      Mobility  Bed Mobility Overal bed mobility: Independent                Transfers Overall transfer level: Independent                  Ambulation/Gait Ambulation/Gait assistance: Independent Ambulation Distance (Feet): 300 Feet Assistive device: None Gait Pattern/deviations: WFL(Within Functional Limits)   Gait velocity interpretation: at or above normal speed for age/gender General Gait Details: steady, no loss of balance  Stairs            Wheelchair Mobility    Modified Rankin (Stroke Patients Only)       Balance Overall balance assessment: Independent                                           Pertinent Vitals/Pain Pain Assessment: No/denies pain    Home Living Family/patient expects to be discharged to:: Private residence Living Arrangements: Spouse/significant other Available Help at Discharge: Family;Available 24 hours/day Type of Home: House       Home Layout: One level Home Equipment: Walker - 2 wheels;Cane - single point      Prior Function Level of  Independence: Independent         Comments: independent ADLs and ambulation     Hand Dominance        Extremity/Trunk Assessment   Upper Extremity Assessment Upper Extremity Assessment: Overall WFL for tasks assessed    Lower Extremity Assessment Lower Extremity Assessment: Overall WFL for tasks assessed    Cervical / Trunk Assessment Cervical / Trunk Assessment: Normal  Communication   Communication: HOH  Cognition Arousal/Alertness: Awake/alert Behavior During Therapy: WFL for tasks assessed/performed Overall Cognitive Status: Within Functional Limits for tasks assessed                                        General Comments      Exercises     Assessment/Plan    PT Assessment Patent does not need any further PT services  PT Problem List         PT Treatment Interventions      PT Goals (Current goals can be found in the Care Plan section)  Acute Rehab PT Goals PT Goal Formulation: All assessment and education complete, DC therapy    Frequency     Barriers to discharge        Co-evaluation  AM-PAC PT "6 Clicks" Daily Activity  Outcome Measure Difficulty turning over in bed (including adjusting bedclothes, sheets and blankets)?: None Difficulty moving from lying on back to sitting on the side of the bed? : None Difficulty sitting down on and standing up from a chair with arms (e.g., wheelchair, bedside commode, etc,.)?: None Help needed moving to and from a bed to chair (including a wheelchair)?: None Help needed walking in hospital room?: None Help needed climbing 3-5 steps with a railing? : None 6 Click Score: 24    End of Session Equipment Utilized During Treatment: Gait belt Activity Tolerance: Patient tolerated treatment well Patient left: in chair;with call bell/phone within reach;with nursing/sitter in room Nurse Communication: Mobility status      Time: [id number] PT Time Calculation (min) (ACUTE  ONLY): 10 min   Charges:   PT Evaluation $PT Eval Low Complexity: 1 Low     PT G Codes:          Tamala SerUhlenberg, Zohan Shiflet Kistler 04/11/2017, 10:47 AM [phone]

## 2017-04-11 NOTE — Progress Notes (Signed)
[facility] PROGRESS NOTE    Progress Note  [name] Steele  DGU:[id number] DOB: 05/20/1940 DOA: 04/09/2017 PCP: Merri BrunettePharr, Walter, MD     Brief Narrative:   [name] Steele is an 76 y.o. male past medical history of gout diabetes mellitus comes in with chief complaint of fatigue and fever, frequency with urination, temperature was checked in the ED was 103.4  Assessment/Plan:   Sepsis (HCC)/UTI (urinary tract infection) He spiked a temperature on 04/10/2017, has no leukocytosis, vital signs have remained stable. Continue IV Rocephin. Urine culture data showed gram-negative rods more than 100,000 colonies. Blood cultures ID panel showed E. Coli. 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 We will start him on Flomax empirically.  He will need to follow-up with urologist as an outpatient.  Diabetes mellitus type 2: Continue sliding scale insulin.  Gout: Asymptomatic continue colchicine.  Essential hypertension: Continue current medications blood pressure stable.    DVT prophylaxis: lovenox Family Communication:none Disposition Plan/Barrier to D/C: home in am Code Status:     Code Status Orders  (From admission, onward)        Start     Ordered   04/10/17 0302  Full code  Continuous     04/10/17 0301    Code Status History    Date Active Date Inactive Code Status Order ID Comments User Context   This patient has a current code status but no historical code status.        IV Access:    Peripheral IV   Procedures and diagnostic studies:   Dg Chest 2 View  Result Date: 04/09/2017 CLINICAL DATA:  Shortness of breath.  Previous smoker. EXAM: CHEST  2 VIEW COMPARISON:  05/05/2014 FINDINGS: Shallow inspiration. Focal infiltration or atelectasis in the left lung base. No blunting of costophrenic angles. No pneumothorax. Mediastinal contours appear intact. There appears to be an old  impaction fracture deformity of the left proximal humerus. IMPRESSION: Shallow inspiration with linear atelectasis or infiltration in the left lung base. Electronically Signed   By: Burman NievesWilliam  Stevens M.D.   On: 04/09/2017 23:14     Medical Consultants:    None.  Anti-Infectives:   rocephin  Subjective:    [name] Steele he relates he feels better than yesterday, he relates he would like to go home but he understands that we need to wait for sensitivities.  Objective:    Vitals:   04/10/17 1458 04/10/17 2042 04/11/17 0034 04/11/17 0508  BP: (!) 155/80 (!) 157/80  136/71  Pulse: 77 91  73  Resp:  (!) 26  20  Temp: 98.9 F (37.2 C) (!) 102.6 F (39.2 C) 99.2 F (37.3 C) 99.6 F (37.6 C)  TempSrc: Oral Oral Oral Oral  SpO2: 95% 94%  93%  Weight:      Height:        Intake/Output Summary (Last 24 hours) at 04/11/2017 0916 Last data filed at 04/11/2017 0509 Gross per 24 hour  Intake 1020 ml  Output 2350 ml  Net -1330 ml   Filed Weights   04/10/17 0300  Weight: 91.1 kg (200 lb 13.4 oz)    Exam: General exam: In no acute distress. Respiratory system: Good air movement and clear to auscultation. Cardiovascular system: S1 & S2 heard, RRR.  Gastrointestinal system: Abdomen is nondistended, soft and nontender. No suprapubic tenderness Central nervous system: Alert and oriented. No focal neurological deficits. Psychiatry: Judgement and insight appear normal. Mood &  affect appropriate.    Data Reviewed:    Labs: Basic Metabolic Panel: Recent Labs  Lab 04/09/17 2215 04/10/17 0542  NA 137 139  K 3.4* 3.7  CL 104 106  CO2 24 25  GLUCOSE 135* 158*  BUN 13 12  CREATININE 1.07 1.12  CALCIUM 8.8* 8.1*   GFR Estimated Creatinine Clearance: 64.8 mL/min (by C-G formula based on SCr of 1.12 mg/dL). Liver Function Tests: Recent Labs  Lab 04/09/17 2215 04/10/17 0542  AST 32 24  ALT 30 25  ALKPHOS 69 53  BILITOT 1.2 0.7  PROT 7.1 5.8*  ALBUMIN 3.7 3.1*    No results for input(s): LIPASE, AMYLASE in the last 168 hours. No results for input(s): AMMONIA in the last 168 hours. Coagulation profile Recent Labs  Lab 04/09/17 2220  INR 1.21    CBC: Recent Labs  Lab 04/09/17 2215 04/10/17 0542  WBC 8.1 5.8  NEUTROABS 6.7  --   HGB 14.7 12.7*  HCT 44.0 39.9  MCV 95.2 97.3  PLT 205 174   Cardiac Enzymes: No results for input(s): CKTOTAL, CKMB, CKMBINDEX, TROPONINI in the last 168 hours. BNP (last 3 results) No results for input(s): PROBNP in the last 8760 hours. CBG: Recent Labs  Lab 04/10/17 0814 04/10/17 1201 04/10/17 1736 04/10/17 2124 04/11/17 0722  GLUCAP 111* 134* 103* 98 100*   D-Dimer: No results for input(s): DDIMER in the last 72 hours. Hgb A1c: No results for input(s): HGBA1C in the last 72 hours. Lipid Profile: No results for input(s): CHOL, HDL, LDLCALC, TRIG, CHOLHDL, LDLDIRECT in the last 72 hours. Thyroid function studies: No results for input(s): TSH, T4TOTAL, T3FREE, THYROIDAB in the last 72 hours.  Invalid input(s): FREET3 Anemia work up: No results for input(s): VITAMINB12, FOLATE, FERRITIN, TIBC, IRON, RETICCTPCT in the last 72 hours. Sepsis Labs: Recent Labs  Lab 04/09/17 2215 04/09/17 2238 04/10/17 0542  WBC 8.1  --  5.8  LATICACIDVEN  --  1.76  --    Microbiology Recent Results (from the past 240 hour(s))  Culture, blood (Routine x 2)     Status: None (Preliminary result)   Collection Time: 04/09/17 10:26 PM  Result Value Ref Range Status   Specimen Description BLOOD RIGHT ANTECUBITAL  Final   Special Requests   Final    BOTTLES DRAWN AEROBIC AND ANAEROBIC Blood Culture adequate volume   Culture  Setup Time   Final    GRAM NEGATIVE RODS AEROBIC BOTTLE ONLY CRITICAL VALUE NOTED.  VALUE IS CONSISTENT WITH PREVIOUSLY REPORTED AND CALLED VALUE. Performed at [facility], [address], [place], [place] [address]    Culture GRAM NEGATIVE RODS  Final   Report Status PENDING   Incomplete  Culture, blood (Routine x 2)     Status: None (Preliminary result)   Collection Time: 04/09/17 10:30 PM  Result Value Ref Range Status   Specimen Description BLOOD LEFT ANTECUBITAL  Final   Special Requests   Final    BOTTLES DRAWN AEROBIC AND ANAEROBIC Blood Culture adequate volume   Culture  Setup Time   Final    GRAM NEGATIVE RODS IN BOTH AEROBIC AND ANAEROBIC BOTTLES CRITICAL RESULT CALLED TO, READ BACK BY AND VERIFIED WITH: A PHAM,PHARMD AT 1422 04/10/17 BY L BENFIELD Performed at [facility], [address] [place], [place] [address]    Culture GRAM NEGATIVE RODS  Final   Report Status PENDING  Incomplete  Blood Culture ID Panel (Reflexed)     Status:  Abnormal   Collection Time: 04/09/17 10:30 PM  Result Value Ref Range Status   Enterococcus species NOT DETECTED NOT DETECTED Final   Listeria monocytogenes NOT DETECTED NOT DETECTED Final   Staphylococcus species NOT DETECTED NOT DETECTED Final   Staphylococcus aureus NOT DETECTED NOT DETECTED Final   Streptococcus species NOT DETECTED NOT DETECTED Final   Streptococcus agalactiae NOT DETECTED NOT DETECTED Final   Streptococcus pneumoniae NOT DETECTED NOT DETECTED Final   Streptococcus pyogenes NOT DETECTED NOT DETECTED Final   Acinetobacter baumannii NOT DETECTED NOT DETECTED Final   Enterobacteriaceae species DETECTED (A) NOT DETECTED Final    Comment: Enterobacteriaceae represent a large family of gram-negative bacteria, not a single organism. CRITICAL RESULT CALLED TO, READ BACK BY AND VERIFIED WITH: A PHAM,PHARMD AT 1422 04/10/17 BY L BENFIELD    Enterobacter cloacae complex NOT DETECTED NOT DETECTED Final   Escherichia coli DETECTED (A) NOT DETECTED Final    Comment: CRITICAL RESULT CALLED TO, READ BACK BY AND VERIFIED WITH: A PHAM,PHARMD AT 1422 04/10/17 BY L BENFIELD    Klebsiella oxytoca NOT DETECTED NOT DETECTED Final   Klebsiella pneumoniae NOT DETECTED NOT DETECTED Final   Proteus species NOT  DETECTED NOT DETECTED Final   Serratia marcescens NOT DETECTED NOT DETECTED Final   Carbapenem resistance NOT DETECTED NOT DETECTED Final   Haemophilus influenzae NOT DETECTED NOT DETECTED Final   Neisseria meningitidis NOT DETECTED NOT DETECTED Final   Pseudomonas aeruginosa NOT DETECTED NOT DETECTED Final   Candida albicans NOT DETECTED NOT DETECTED Final   Candida glabrata NOT DETECTED NOT DETECTED Final   Candida krusei NOT DETECTED NOT DETECTED Final   Candida parapsilosis NOT DETECTED NOT DETECTED Final   Candida tropicalis NOT DETECTED NOT DETECTED Final    Comment: Performed at [facility], [address] [place], [place] [address]  Urine Culture     Status: Abnormal (Preliminary result)   Collection Time: 04/09/17 10:34 PM  Result Value Ref Range Status   Specimen Description URINE, RANDOM  Final   Special Requests NONE  Final   Culture >=100,000 COLONIES/mL GRAM NEGATIVE RODS (A)  Final   Report Status PENDING  Incomplete     Medications:   . amLODipine  10 mg Oral Daily  . colchicine  0.6 mg Oral Daily  . enoxaparin (LOVENOX) injection  40 mg Subcutaneous Q24H  . insulin aspart  0-15 Units Subcutaneous TID WC  . insulin aspart  0-5 Units Subcutaneous QHS  . insulin aspart  4 Units Subcutaneous TID WC  . irbesartan  300 mg Oral Daily  . tamsulosin  0.4 mg Oral QPC supper   Continuous Infusions: . sodium chloride 75 mL/hr at 04/10/17 1814  . cefTRIAXone (ROCEPHIN)  IV Stopped (04/10/17 1253)      LOS: 1 day   Marinda ElkAbraham Feliz Ortiz  [facility] Pager [phone]  *Please refer to [email or web address], password [email or web address] to get updated schedule on who will round on this patient, as hospitalists switch teams weekly. If 7PM-7AM, please contact night-coverage at [email or web address], password [email or web address] for any overnight needs.  04/11/2017, 9:16 AM

## 2017-04-12 DIAGNOSIS — A4151 Sepsis due to Escherichia coli [E. coli]: Principal | ICD-10-CM

## 2017-04-12 LAB — CULTURE, BLOOD (ROUTINE X 2)
SPECIAL REQUESTS: ADEQUATE
Special Requests: ADEQUATE

## 2017-04-12 LAB — GLUCOSE, CAPILLARY: Glucose-Capillary: 111 mg/dL — ABNORMAL HIGH (ref 65–99)

## 2017-04-12 LAB — URINE CULTURE: Culture: >=100000 — AB

## 2017-04-12 MED ORDER — CEFPODOXIME PROXETIL 200 MG PO TABS
200.0000 mg | ORAL_TABLET | Freq: Two times a day (BID) | ORAL | Status: DC
  Filled 2017-04-12: qty 1

## 2017-04-12 MED ORDER — CEFPODOXIME PROXETIL 200 MG PO TABS: 200.0000 mg | ORAL_TABLET | Freq: Two times a day (BID) | ORAL | 0 refills | Status: DC

## 2017-04-12 MED ORDER — TAMSULOSIN HCL 0.4 MG PO CAPS: 0.4000 mg | ORAL_CAPSULE | Freq: Every day | ORAL | 0 refills | Status: DC

## 2017-04-12 NOTE — Discharge Summary (Signed)
Physician Discharge Summary  [name] Steele WUJ:[id number] DOB: 06/07/1940 DOA: 04/09/2017  PCP: [name], MD  Admit date: 04/09/2017 Discharge date: 04/12/2017  Admitted From: home Disposition:  Home  Recommendations for Outpatient Follow-up:  1. Follow up with Urology in 1-2 weeks 2. Please obtain BMP/CBC in one week 3. Please follow up on the following pending results:  Home Health:No Equipment/Devices:none  Discharge Condition:stable CODE STATUS:full Diet recommendation: Heart Healthy   Brief/Interim Summary: 76 y.o. male past medical history of gout diabetes mellitus comes in with chief complaint of fatigue and fever, frequency with urination, temperature was checked in the ED was 103.4  Discharge Diagnoses:  Active Problems:   UTI (urinary tract infection)   Sepsis (HCC)   Type 2 diabetes mellitus without complication (HCC)   Essential hypertension   E coli bacteremia  Sepsis due to E. coli bacteremia in the setting of the UTI: He was admitted her on IV empiric Rocephin to the hospital blood cultures and urine cultures were ordered which Which both grew E. coli sensitive to third-generation cephalosporins resistant to Cipro and Bactrim. He defervesced he was changed to oral Bactrim which she will continue for 2 weeks as an outpatient.  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 He was started empirically on oral Flomax. He will follow-up with his urologist as an outpatient.  Diabetes mellitus type 2: No changes were made to his medication.  Gout asymptomatic, no changes were made to his medication.  Essential hypertension: No changes were made to his medication. Discharge Instructions  Discharge Instructions    Diet - low sodium heart healthy   Complete by:  As directed    Increase activity slowly   Complete by:  As directed      Allergies as of 04/12/2017      Reactions   Erythromycin Other (See  Comments)   Severe Abdominal Pain   Penicillins Hives   Has patient had a PCN reaction causing immediate rash, facial/tongue/throat swelling, SOB or lightheadedness with hypotension:  Yes  Has patient had a PCN reaction causing severe rash involving mucus membranes or skin necrosis:  No Has patient had a PCN reaction that required hospitalization: No Has patient had a PCN reaction occurring within the last 10 years: No If all of the above answers are "NO", then may proceed with Cephalosporin use. Tolerated CTX 11/23 admit      Medication List    TAKE these medications   allopurinol 100 MG tablet Commonly known as:  ZYLOPRIM Take 1 tablet (100 mg total) by mouth daily.   amLODipine 10 MG tablet Commonly known as:  NORVASC Take 10 mg by mouth daily.   cefpodoxime 200 MG tablet Commonly known as:  VANTIN Take 1 tablet (200 mg total) by mouth every 12 (twelve) hours.   colchicine 0.6 MG tablet Take 0.6 mg by mouth daily.   glipiZIDE-metformin 2.5-500 MG tablet Commonly known as:  METAGLIP Take 1 tablet by mouth 2 (two) times daily before a meal.   tamsulosin 0.4 MG Caps capsule Commonly known as:  FLOMAX Take 1 capsule (0.4 mg total) by mouth daily after supper.   telmisartan 80 MG tablet Commonly known as:  MICARDIS Take 80 mg by mouth daily.       Allergies  Allergen Reactions  . Erythromycin Other (See Comments)    Severe Abdominal Pain  . Penicillins Hives    Has patient had a PCN reaction causing immediate rash, facial/tongue/throat swelling,  SOB or lightheadedness with hypotension:  Yes  Has patient had a PCN reaction causing severe rash involving mucus membranes or skin necrosis:  No Has patient had a PCN reaction that required hospitalization: No Has patient had a PCN reaction occurring within the last 10 years: No If all of the above answers are "NO", then may proceed with Cephalosporin use. Tolerated CTX 11/23 admit      Consultations:  None   Procedures/Studies: Dg Chest 2 View  Result Date: 04/09/2017 CLINICAL DATA:  Shortness of breath.  Previous smoker. EXAM: CHEST  2 VIEW COMPARISON:  05/05/2014 FINDINGS: Shallow inspiration. Focal infiltration or atelectasis in the left lung base. No blunting of costophrenic angles. No pneumothorax. Mediastinal contours appear intact. There appears to be an old impaction fracture deformity of the left proximal humerus. IMPRESSION: Shallow inspiration with linear atelectasis or infiltration in the left lung base. Electronically Signed   By: Burman Nieves M.D.   On: 04/09/2017 23:14    Subjective: No new complains.  Discharge Exam: Vitals:   04/12/17 0601 04/12/17 1034  BP: (!) 144/78 (!) 164/83  Pulse: 69   Resp: 16   Temp: 98.8 F (37.1 C)   SpO2: 94%    Vitals:   04/11/17 1502 04/11/17 2153 04/12/17 0601 04/12/17 1034  BP: 136/69 (!) 152/73 (!) 144/78 (!) 164/83  Pulse: 73 70 69   Resp: 15 16 16    Temp: 99.6 F (37.6 C) 99 F (37.2 C) 98.8 F (37.1 C)   TempSrc: Oral Oral Oral   SpO2: 94% 94% 94%   Weight:      Height:        General: Pt is alert, awake, not in acute distress Cardiovascular: RRR, S1/S2 +, no rubs, no gallops Respiratory: CTA bilaterally, no wheezing, no rhonchi Abdominal: Soft, NT, ND, bowel sounds + Extremities: no edema, no cyanosis    The results of significant diagnostics from this hospitalization (including imaging, microbiology, ancillary and laboratory) are listed below for reference.     Microbiology: Recent Results (from the past 240 hour(s))  Culture, blood (Routine x 2)     Status: None (Preliminary result)   Collection Time: 04/09/17 10:26 PM  Result Value Ref Range Status   Specimen Description BLOOD RIGHT ANTECUBITAL  Final   Special Requests   Final    BOTTLES DRAWN AEROBIC AND ANAEROBIC Blood Culture adequate volume   Culture  Setup Time   Final    GRAM NEGATIVE RODS AEROBIC BOTTLE  ONLY CRITICAL VALUE NOTED.  VALUE IS CONSISTENT WITH PREVIOUSLY REPORTED AND CALLED VALUE. Performed at [facility], [address] [place], [place] [address]    Culture GRAM NEGATIVE RODS  Final   Report Status PENDING  Incomplete  Culture, blood (Routine x 2)     Status: Abnormal (Preliminary result)   Collection Time: 04/09/17 10:30 PM  Result Value Ref Range Status   Specimen Description BLOOD LEFT ANTECUBITAL  Final   Special Requests   Final    BOTTLES DRAWN AEROBIC AND ANAEROBIC Blood Culture adequate volume   Culture  Setup Time   Final    GRAM NEGATIVE RODS IN BOTH AEROBIC AND ANAEROBIC BOTTLES CRITICAL RESULT CALLED TO, READ BACK BY AND VERIFIED WITH: A PHAM,PHARMD AT 1422 04/10/17 BY L BENFIELD    Culture (A)  Final    ESCHERICHIA COLI SUSCEPTIBILITIES TO FOLLOW Performed at [facility], [address], [place], [place] [address]    Report Status PENDING  Incomplete  Blood Culture ID Panel (Reflexed)     Status: Abnormal   Collection Time: 04/09/17 10:30 PM  Result Value Ref Range Status   Enterococcus species NOT DETECTED NOT DETECTED Final   Listeria monocytogenes NOT DETECTED NOT DETECTED Final   Staphylococcus species NOT DETECTED NOT DETECTED Final   Staphylococcus aureus NOT DETECTED NOT DETECTED Final   Streptococcus species NOT DETECTED NOT DETECTED Final   Streptococcus agalactiae NOT DETECTED NOT DETECTED Final   Streptococcus pneumoniae NOT DETECTED NOT DETECTED Final   Streptococcus pyogenes NOT DETECTED NOT DETECTED Final   Acinetobacter baumannii NOT DETECTED NOT DETECTED Final   Enterobacteriaceae species DETECTED (A) NOT DETECTED Final    Comment: Enterobacteriaceae represent a large family of gram-negative bacteria, not a single organism. CRITICAL RESULT CALLED TO, READ BACK BY AND VERIFIED WITH: A PHAM,PHARMD AT 1422 04/10/17 BY L BENFIELD    Enterobacter cloacae complex NOT DETECTED NOT DETECTED Final   Escherichia coli DETECTED  (A) NOT DETECTED Final    Comment: CRITICAL RESULT CALLED TO, READ BACK BY AND VERIFIED WITH: A PHAM,PHARMD AT 1422 04/10/17 BY L BENFIELD    Klebsiella oxytoca NOT DETECTED NOT DETECTED Final   Klebsiella pneumoniae NOT DETECTED NOT DETECTED Final   Proteus species NOT DETECTED NOT DETECTED Final   Serratia marcescens NOT DETECTED NOT DETECTED Final   Carbapenem resistance NOT DETECTED NOT DETECTED Final   Haemophilus influenzae NOT DETECTED NOT DETECTED Final   Neisseria meningitidis NOT DETECTED NOT DETECTED Final   Pseudomonas aeruginosa NOT DETECTED NOT DETECTED Final   Candida albicans NOT DETECTED NOT DETECTED Final   Candida glabrata NOT DETECTED NOT DETECTED Final   Candida krusei NOT DETECTED NOT DETECTED Final   Candida parapsilosis NOT DETECTED NOT DETECTED Final   Candida tropicalis NOT DETECTED NOT DETECTED Final    Comment: Performed at [facility], [address] [place], [place] [address]  Urine Culture     Status: Abnormal   Collection Time: 04/09/17 10:34 PM  Result Value Ref Range Status   Specimen Description URINE, RANDOM  Final   Special Requests NONE  Final   Culture >=100,000 COLONIES/mL ESCHERICHIA COLI (A)  Final   Report Status 04/12/2017 FINAL  Final   Organism ID, Bacteria ESCHERICHIA COLI (A)  Final      Susceptibility   Escherichia coli - MIC*    AMPICILLIN 16 INTERMEDIATE Intermediate     CEFAZOLIN <=4 SENSITIVE Sensitive     CEFTRIAXONE <=1 SENSITIVE Sensitive     CIPROFLOXACIN >=4 RESISTANT Resistant     GENTAMICIN <=1 SENSITIVE Sensitive     IMIPENEM <=0.25 SENSITIVE Sensitive     NITROFURANTOIN <=16 SENSITIVE Sensitive     TRIMETH/SULFA >=320 RESISTANT Resistant     AMPICILLIN/SULBACTAM 4 SENSITIVE Sensitive     PIP/TAZO <=4 SENSITIVE Sensitive     Extended ESBL NEGATIVE Sensitive     * >=100,000 COLONIES/mL ESCHERICHIA COLI     Labs: BNP (last 3 results) No results for input(s): BNP in the last 8760 hours. Basic Metabolic  Panel: Recent Labs  Lab 04/09/17 2215 04/10/17 0542  NA 137 139  K 3.4* 3.7  CL 104 106  CO2 24 25  GLUCOSE 135* 158*  BUN 13 12  CREATININE 1.07 1.12  CALCIUM 8.8* 8.1*   Liver Function Tests: Recent Labs  Lab 04/09/17 2215 04/10/17 0542  AST 32 24  ALT 30 25  ALKPHOS 69 53  BILITOT 1.2 0.7  PROT 7.1 5.8*  ALBUMIN 3.7 3.1*  No results for input(s): LIPASE, AMYLASE in the last 168 hours. No results for input(s): AMMONIA in the last 168 hours. CBC: Recent Labs  Lab 04/09/17 2215 04/10/17 0542  WBC 8.1 5.8  NEUTROABS 6.7  --   HGB 14.7 12.7*  HCT 44.0 39.9  MCV 95.2 97.3  PLT 205 174   Cardiac Enzymes: No results for input(s): CKTOTAL, CKMB, CKMBINDEX, TROPONINI in the last 168 hours. BNP: Invalid input(s): POCBNP CBG: Recent Labs  Lab 04/11/17 0722 04/11/17 1243 04/11/17 1759 04/11/17 2151 04/12/17 0738  GLUCAP 100* 97 102* 152* 111*   D-Dimer No results for input(s): DDIMER in the last 72 hours. Hgb A1c Recent Labs    04/10/17 0542  HGBA1C 5.6   Lipid Profile No results for input(s): CHOL, HDL, LDLCALC, TRIG, CHOLHDL, LDLDIRECT in the last 72 hours. Thyroid function studies No results for input(s): TSH, T4TOTAL, T3FREE, THYROIDAB in the last 72 hours.  Invalid input(s): FREET3 Anemia work up No results for input(s): VITAMINB12, FOLATE, FERRITIN, TIBC, IRON, RETICCTPCT in the last 72 hours. Urinalysis    Component Value Date/Time   COLORURINE AMBER (A) 04/09/2017 2215   APPEARANCEUR CLEAR 04/09/2017 2215   LABSPEC 1.012 04/09/2017 2215   PHURINE 6.0 04/09/2017 2215   GLUCOSEU NEGATIVE 04/09/2017 2215   HGBUR SMALL (A) 04/09/2017 2215   BILIRUBINUR NEGATIVE 04/09/2017 2215   KETONESUR NEGATIVE 04/09/2017 2215   PROTEINUR NEGATIVE 04/09/2017 2215   UROBILINOGEN 0.2 10/06/2008 1745   NITRITE POSITIVE (A) 04/09/2017 2215   LEUKOCYTESUR LARGE (A) 04/09/2017 2215   Sepsis Labs Invalid input(s): PROCALCITONIN,  WBC,   LACTICIDVEN Microbiology Recent Results (from the past 240 hour(s))  Culture, blood (Routine x 2)     Status: None (Preliminary result)   Collection Time: 04/09/17 10:26 PM  Result Value Ref Range Status   Specimen Description BLOOD RIGHT ANTECUBITAL  Final   Special Requests   Final    BOTTLES DRAWN AEROBIC AND ANAEROBIC Blood Culture adequate volume   Culture  Setup Time   Final    GRAM NEGATIVE RODS AEROBIC BOTTLE ONLY CRITICAL VALUE NOTED.  VALUE IS CONSISTENT WITH PREVIOUSLY REPORTED AND CALLED VALUE. Performed at [facility], [address] [place], [place] [address]    Culture GRAM NEGATIVE RODS  Final   Report Status PENDING  Incomplete  Culture, blood (Routine x 2)     Status: Abnormal (Preliminary result)   Collection Time: 04/09/17 10:30 PM  Result Value Ref Range Status   Specimen Description BLOOD LEFT ANTECUBITAL  Final   Special Requests   Final    BOTTLES DRAWN AEROBIC AND ANAEROBIC Blood Culture adequate volume   Culture  Setup Time   Final    GRAM NEGATIVE RODS IN BOTH AEROBIC AND ANAEROBIC BOTTLES CRITICAL RESULT CALLED TO, READ BACK BY AND VERIFIED WITH: A PHAM,PHARMD AT 1422 04/10/17 BY L BENFIELD    Culture (A)  Final    ESCHERICHIA COLI SUSCEPTIBILITIES TO FOLLOW Performed at [facility], [address] [place], [place] [address]    Report Status PENDING  Incomplete  Blood Culture ID Panel (Reflexed)     Status: Abnormal   Collection Time: 04/09/17 10:30 PM  Result Value Ref Range Status   Enterococcus species NOT DETECTED NOT DETECTED Final   Listeria monocytogenes NOT DETECTED NOT DETECTED Final   Staphylococcus species NOT DETECTED NOT DETECTED Final   Staphylococcus aureus NOT DETECTED NOT DETECTED Final   Streptococcus species NOT DETECTED NOT DETECTED Final  Streptococcus agalactiae NOT DETECTED NOT DETECTED Final   Streptococcus pneumoniae NOT DETECTED NOT DETECTED Final   Streptococcus pyogenes NOT DETECTED NOT DETECTED Final    Acinetobacter baumannii NOT DETECTED NOT DETECTED Final   Enterobacteriaceae species DETECTED (A) NOT DETECTED Final    Comment: Enterobacteriaceae represent a large family of gram-negative bacteria, not a single organism. CRITICAL RESULT CALLED TO, READ BACK BY AND VERIFIED WITH: A PHAM,PHARMD AT 1422 04/10/17 BY L BENFIELD    Enterobacter cloacae complex NOT DETECTED NOT DETECTED Final   Escherichia coli DETECTED (A) NOT DETECTED Final    Comment: CRITICAL RESULT CALLED TO, READ BACK BY AND VERIFIED WITH: A PHAM,PHARMD AT 1422 04/10/17 BY L BENFIELD    Klebsiella oxytoca NOT DETECTED NOT DETECTED Final   Klebsiella pneumoniae NOT DETECTED NOT DETECTED Final   Proteus species NOT DETECTED NOT DETECTED Final   Serratia marcescens NOT DETECTED NOT DETECTED Final   Carbapenem resistance NOT DETECTED NOT DETECTED Final   Haemophilus influenzae NOT DETECTED NOT DETECTED Final   Neisseria meningitidis NOT DETECTED NOT DETECTED Final   Pseudomonas aeruginosa NOT DETECTED NOT DETECTED Final   Candida albicans NOT DETECTED NOT DETECTED Final   Candida glabrata NOT DETECTED NOT DETECTED Final   Candida krusei NOT DETECTED NOT DETECTED Final   Candida parapsilosis NOT DETECTED NOT DETECTED Final   Candida tropicalis NOT DETECTED NOT DETECTED Final    Comment: Performed at [facility], [address] [place], [place] [address]  Urine Culture     Status: Abnormal   Collection Time: 04/09/17 10:34 PM  Result Value Ref Range Status   Specimen Description URINE, RANDOM  Final   Special Requests NONE  Final   Culture >=100,000 COLONIES/mL ESCHERICHIA COLI (A)  Final   Report Status 04/12/2017 FINAL  Final   Organism ID, Bacteria ESCHERICHIA COLI (A)  Final      Susceptibility   Escherichia coli - MIC*    AMPICILLIN 16 INTERMEDIATE Intermediate     CEFAZOLIN <=4 SENSITIVE Sensitive     CEFTRIAXONE <=1 SENSITIVE Sensitive     CIPROFLOXACIN >=4 RESISTANT Resistant     GENTAMICIN <=1  SENSITIVE Sensitive     IMIPENEM <=0.25 SENSITIVE Sensitive     NITROFURANTOIN <=16 SENSITIVE Sensitive     TRIMETH/SULFA >=320 RESISTANT Resistant     AMPICILLIN/SULBACTAM 4 SENSITIVE Sensitive     PIP/TAZO <=4 SENSITIVE Sensitive     Extended ESBL NEGATIVE Sensitive     * >=100,000 COLONIES/mL ESCHERICHIA COLI     Time coordinating discharge: Over 30 minutes  SIGNED:   Marinda ElkAbraham Feliz Ortiz, MD  [facility] 04/12/2017, 10:38 AM Pager   If 7PM-7AM, please contact night-coverage [email or web address] Password [email or web address]

## 2017-06-10 DIAGNOSIS — R600 Localized edema: Secondary | ICD-10-CM | POA: Diagnosis not present

## 2017-06-10 DIAGNOSIS — N12 Tubulo-interstitial nephritis, not specified as acute or chronic: Secondary | ICD-10-CM | POA: Diagnosis not present

## 2017-06-10 DIAGNOSIS — B9620 Unspecified Escherichia coli [E. coli] as the cause of diseases classified elsewhere: Secondary | ICD-10-CM | POA: Diagnosis not present

## 2017-06-10 DIAGNOSIS — E876 Hypokalemia: Secondary | ICD-10-CM | POA: Diagnosis not present

## 2017-06-14 DIAGNOSIS — I10 Essential (primary) hypertension: Secondary | ICD-10-CM | POA: Diagnosis not present

## 2017-11-04 DIAGNOSIS — I10 Essential (primary) hypertension: Secondary | ICD-10-CM | POA: Diagnosis not present

## 2017-11-04 DIAGNOSIS — Z125 Encounter for screening for malignant neoplasm of prostate: Secondary | ICD-10-CM | POA: Diagnosis not present

## 2017-11-04 DIAGNOSIS — E119 Type 2 diabetes mellitus without complications: Secondary | ICD-10-CM | POA: Diagnosis not present

## 2017-11-04 DIAGNOSIS — N390 Urinary tract infection, site not specified: Secondary | ICD-10-CM | POA: Diagnosis not present

## 2017-11-09 DIAGNOSIS — M2550 Pain in unspecified joint: Secondary | ICD-10-CM | POA: Diagnosis not present

## 2017-11-09 DIAGNOSIS — Z23 Encounter for immunization: Secondary | ICD-10-CM | POA: Diagnosis not present

## 2017-11-09 DIAGNOSIS — B351 Tinea unguium: Secondary | ICD-10-CM | POA: Diagnosis not present

## 2017-11-09 DIAGNOSIS — R5383 Other fatigue: Secondary | ICD-10-CM | POA: Diagnosis not present

## 2017-11-09 DIAGNOSIS — Z0000 Encounter for general adult medical examination without abnormal findings: Secondary | ICD-10-CM | POA: Diagnosis not present

## 2017-11-09 DIAGNOSIS — E876 Hypokalemia: Secondary | ICD-10-CM | POA: Diagnosis not present

## 2017-11-09 DIAGNOSIS — I493 Ventricular premature depolarization: Secondary | ICD-10-CM | POA: Diagnosis not present

## 2017-11-09 DIAGNOSIS — R600 Localized edema: Secondary | ICD-10-CM | POA: Diagnosis not present

## 2017-11-09 DIAGNOSIS — E1140 Type 2 diabetes mellitus with diabetic neuropathy, unspecified: Secondary | ICD-10-CM | POA: Diagnosis not present

## 2017-11-09 DIAGNOSIS — M1A09X1 Idiopathic chronic gout, multiple sites, with tophus (tophi): Secondary | ICD-10-CM | POA: Diagnosis not present

## 2017-11-09 DIAGNOSIS — I38 Endocarditis, valve unspecified: Secondary | ICD-10-CM | POA: Diagnosis not present

## 2017-11-09 DIAGNOSIS — I10 Essential (primary) hypertension: Secondary | ICD-10-CM | POA: Diagnosis not present

## 2017-12-15 ENCOUNTER — Emergency Department (HOSPITAL_BASED_OUTPATIENT_CLINIC_OR_DEPARTMENT_OTHER): Payer: Medicare Other

## 2017-12-15 ENCOUNTER — Emergency Department (HOSPITAL_BASED_OUTPATIENT_CLINIC_OR_DEPARTMENT_OTHER)
Admission: EM | Admit: 2017-12-15 | Discharge: 2017-12-15 | Disposition: A | Discharge status: Home / Self Care | Payer: Medicare Other | Attending: Emergency Medicine | Admitting: Emergency Medicine

## 2017-12-15 ENCOUNTER — Other Ambulatory Visit: Payer: Self-pay

## 2017-12-15 ENCOUNTER — Encounter (HOSPITAL_BASED_OUTPATIENT_CLINIC_OR_DEPARTMENT_OTHER): Payer: Self-pay | Admitting: Emergency Medicine

## 2017-12-15 DIAGNOSIS — I10 Essential (primary) hypertension: Secondary | ICD-10-CM | POA: Diagnosis not present

## 2017-12-15 DIAGNOSIS — E119 Type 2 diabetes mellitus without complications: Secondary | ICD-10-CM | POA: Insufficient documentation

## 2017-12-15 DIAGNOSIS — J189 Pneumonia, unspecified organism: Secondary | ICD-10-CM | POA: Insufficient documentation

## 2017-12-15 DIAGNOSIS — Z7984 Long term (current) use of oral hypoglycemic drugs: Secondary | ICD-10-CM | POA: Insufficient documentation

## 2017-12-15 DIAGNOSIS — R05 Cough: Secondary | ICD-10-CM | POA: Diagnosis not present

## 2017-12-15 DIAGNOSIS — R0781 Pleurodynia: Secondary | ICD-10-CM | POA: Diagnosis not present

## 2017-12-15 DIAGNOSIS — Z79899 Other long term (current) drug therapy: Secondary | ICD-10-CM | POA: Insufficient documentation

## 2017-12-15 DIAGNOSIS — Z87891 Personal history of nicotine dependence: Secondary | ICD-10-CM | POA: Insufficient documentation

## 2017-12-15 DIAGNOSIS — R079 Chest pain, unspecified: Secondary | ICD-10-CM | POA: Diagnosis not present

## 2017-12-15 HISTORY — DX: Urinary tract infection, site not specified: N39.0

## 2017-12-15 LAB — CBC WITH DIFFERENTIAL/PLATELET
BASOS ABS: 0 10*3/uL (ref 0.0–0.1)
BASOS PCT: 0 %
EOS ABS: 0.1 10*3/uL (ref 0.0–0.7)
EOS PCT: 2 %
HCT: 41.5 % (ref 39.0–52.0)
Hemoglobin: 14 g/dL (ref 13.0–17.0)
Lymphocytes Relative: 18 %
Lymphs Abs: 1.3 10*3/uL (ref 0.7–4.0)
MCH: 33.1 pg (ref 26.0–34.0)
MCHC: 33.7 g/dL (ref 30.0–36.0)
MCV: 98.1 fL (ref 78.0–100.0)
MONO ABS: 0.7 10*3/uL (ref 0.1–1.0)
MONOS PCT: 10 %
NEUTROS PCT: 70 %
Neutro Abs: 4.8 10*3/uL (ref 1.7–7.7)
PLATELETS: 247 10*3/uL (ref 150–400)
RBC: 4.23 MIL/uL (ref 4.22–5.81)
RDW: 14.5 % (ref 11.5–15.5)
WBC: 7 10*3/uL (ref 4.0–10.5)

## 2017-12-15 LAB — BASIC METABOLIC PANEL
ANION GAP: 10 (ref 5–15)
BUN: 12 mg/dL (ref 8–23)
CALCIUM: 9.3 mg/dL (ref 8.9–10.3)
CO2: 27 mmol/L (ref 22–32)
CREATININE: 1.53 mg/dL — AB (ref 0.61–1.24)
Chloride: 103 mmol/L (ref 98–111)
GFR, EST AFRICAN AMERICAN: 49 mL/min — AB (ref >60)
GFR, EST NON AFRICAN AMERICAN: 42 mL/min — AB (ref >60)
Glucose, Bld: 97 mg/dL (ref 70–99)
Potassium: 3.9 mmol/L (ref 3.5–5.1)
SODIUM: 140 mmol/L (ref 135–145)

## 2017-12-15 LAB — D-DIMER, QUANTITATIVE: D-Dimer, Quant: 0.97 ug/mL-FEU — ABNORMAL HIGH (ref 0.00–0.50)

## 2017-12-15 LAB — TROPONIN I

## 2017-12-15 MED ORDER — IOPAMIDOL (ISOVUE-370) INJECTION 76%
100.0000 mL | Freq: Once | INTRAVENOUS | Status: AC | PRN
  Administered 2017-12-15: 100 mL via INTRAVENOUS

## 2017-12-15 MED ORDER — DOXYCYCLINE HYCLATE 100 MG PO CAPS
100.0000 mg | ORAL_CAPSULE | Freq: Two times a day (BID) | ORAL | 0 refills | Status: DC
Start: 2017-12-15 — End: 2018-07-06

## 2017-12-15 MED ORDER — BENZONATATE 100 MG PO CAPS: 100.0000 mg | ORAL_CAPSULE | Freq: Three times a day (TID) | ORAL | 0 refills | Status: DC

## 2017-12-15 NOTE — ED Triage Notes (Signed)
Pt having left rib pain with productive cough for "a while".  Pt also had some orthostatic dizziness today.

## 2017-12-15 NOTE — ED Notes (Signed)
ED Provider at bedside. 

## 2017-12-15 NOTE — ED Provider Notes (Signed)
[facility] EMERGENCY DEPARTMENT Provider Note   CSN: [id number] Arrival date & time: 12/15/17  1436     History   Chief Complaint Chief Complaint  Patient presents with  . Cough    HPI [name] Steele is a 77 y.o. male with hx of DM, HTN, arthritis, presents to ED with complaint of cough and side pain. States cough started 4 days ago. Cough is productive. Reprts associated shortness of breath that is worse with exertion. Reports recent travel both by plane and car.  States pain is worse when taking deep breath, but also is worse with some movements.  He denies any fever.  No abdominal pain.  No nausea or vomiting.  He tried to take over-the-counter cough syrup which did not help.  He reports recent stressors, including losing his sister and brother in the last few weeks.  Reports lower extremity swelling that he states is chronic    Past Medical History:  Diagnosis Date  . Arthritis   . Chronic urinary tract infection   . Diabetes mellitus without complication (HCC)   . Gout   . Hypertension   . Osteoporosis     Patient Active Problem List   Diagnosis Date Noted  . E coli bacteremia 04/11/2017  . UTI (urinary tract infection) 04/10/2017  . Sepsis (HCC) 04/10/2017  . Type 2 diabetes mellitus without complication (HCC) 04/10/2017  . Essential hypertension 04/10/2017    Past Surgical History:  Procedure Laterality Date  . JOINT REPLACEMENT Bilateral         Home Medications    Prior to Admission medications   Medication Sig Start Date End Date Taking? Authorizing Provider  allopurinol (ZYLOPRIM) 100 MG tablet Take 1 tablet (100 mg total) by mouth daily. Patient not taking: Reported on 12/14/2016 05/06/14   Elvina Sidle, MD  amLODipine (NORVASC) 10 MG tablet Take 10 mg by mouth daily.    [provider]  cefpodoxime (VANTIN) 200 MG tablet Take 1 tablet (200 mg total) by mouth every 12 (twelve) hours. 04/12/17   Marinda Elk, MD    colchicine 0.6 MG tablet Take 0.6 mg by mouth daily.    [provider]  glipiZIDE-metformin (METAGLIP) 2.5-500 MG per tablet Take 1 tablet by mouth 2 (two) times daily before a meal.    [provider]  tamsulosin (FLOMAX) 0.4 MG CAPS capsule Take 1 capsule (0.4 mg total) by mouth daily after supper. 04/12/17   Marinda Elk, MD  telmisartan (MICARDIS) 80 MG tablet Take 80 mg by mouth daily.    [provider]    Family History Family History  Problem Relation Age of Onset  . Heart disease Sister     Social History Social History   Tobacco Use  . Smoking status: Former Games developer  . Smokeless tobacco: Never Used  Substance Use Topics  . Alcohol use: Yes    Alcohol/week: 0.0 oz    Comment: social  . Drug use: No     Allergies   Erythromycin and Penicillins   Review of Systems Review of Systems  Constitutional: Negative for chills and fever.  Respiratory: Positive for cough, chest tightness and shortness of breath.   Cardiovascular: Positive for chest pain and leg swelling. Negative for palpitations.  Gastrointestinal: Negative for abdominal distention, abdominal pain, diarrhea, nausea and vomiting.  Genitourinary: Negative for dysuria, frequency, hematuria and urgency.  Musculoskeletal: Negative for arthralgias, myalgias, neck pain and neck stiffness.  Skin: Negative for rash.  Allergic/Immunologic: Negative for  immunocompromised state.  Neurological: Negative for dizziness, weakness, light-headedness, numbness and headaches.     Physical Exam Updated Vital Signs BP 132/69   Pulse 80   Temp 98.5 F (36.9 C)   Resp 16   Ht 5\' 10"  (1.778 m)   Wt 76.2 kg (167 lb 15.9 oz)   SpO2 97%   BMI 24.10 kg/m   Physical Exam  Constitutional: He appears well-developed and well-nourished. No distress.  HENT:  Head: Normocephalic and atraumatic.  Right Ear: External ear normal.  Left Ear: External ear normal.  Mouth/Throat: Oropharynx is  clear and moist.  Eyes: Conjunctivae are normal.  Neck: Neck supple.  Cardiovascular: Normal rate, regular rhythm and normal heart sounds.  Pulmonary/Chest: Effort normal. No respiratory distress. He has no wheezes. He has no rales. He exhibits no tenderness.  Abdominal: Soft. Bowel sounds are normal. He exhibits no distension. There is no tenderness. There is no rebound.  Musculoskeletal: He exhibits edema.  Trace bilateral lower extremity edema  Neurological: He is alert.  Skin: Skin is warm and dry.  Nursing note and vitals reviewed.    ED Treatments / Results  Labs (all labs ordered are listed, but only abnormal results are displayed) Labs Reviewed - No data to display  EKG None  Radiology Dg Chest 2 View  Result Date: 12/15/2017 CLINICAL DATA:  LEFT chest pain and cough for 2 weeks. EXAM: CHEST - 2 VIEW COMPARISON:  04/09/2017 and prior radiographs FINDINGS: The cardiomediastinal silhouette is unremarkable. Mild peribronchial thickening is unchanged. There is no evidence of focal airspace disease, pulmonary edema, suspicious pulmonary nodule/mass, pleural effusion, or pneumothorax. No acute bony abnormalities are identified. Remote rib fractures noted. IMPRESSION: No active cardiopulmonary disease. Electronically Signed   By: Harmon Pier M.D.   On: 12/15/2017 15:29   Ct Angio Chest Pe W And/or Wo Contrast  Result Date: 12/15/2017 CLINICAL DATA:  Left rib pain and productive cough. EXAM: CT ANGIOGRAPHY CHEST WITH CONTRAST TECHNIQUE: Multidetector CT imaging of the chest was performed using the standard protocol during bolus administration of intravenous contrast. Multiplanar CT image reconstructions and MIPs were obtained to evaluate the vascular anatomy. CONTRAST:  ISOVUE-370 IOPAMIDOL (ISOVUE-370) INJECTION 76% COMPARISON:  Chest radiograph 12/15/2017 FINDINGS: Cardiovascular: Satisfactory opacification of the pulmonary arteries to the segmental level. No evidence of  pulmonary embolism. Normal heart size. No pericardial effusion. Calcific atherosclerotic disease of the coronary arteries and aorta. Mediastinum/Nodes: No enlarged mediastinal, hilar, or axillary lymph nodes. Thyroid gland, trachea, and esophagus demonstrate no significant findings. Lungs/Pleura: Upper lobe predominant mild emphysematous changes. Ground-glass opacities in the right upper lobe and less so left upper lobe, suggestive of infectious etiology such is atypical pneumonia or pneumonitis. Atelectatic changes in bilateral lung bases. Upper Abdomen: Probable left renal cyst measuring 2 cm. Musculoskeletal: Healed left sided posterolateral rib fractures. No acute fractures identified. Review of the MIP images confirms the above findings. IMPRESSION: No evidence of pulmonary embolus. Bilateral upper lobe ground-glass pulmonary opacities suggestive of infectious/inflammatory causes such as pneumonitis or atypical pneumonia. Healed left-sided posterolateral rib fractures. Probable left renal cyst. Aortic Atherosclerosis (ICD10-I70.0) and Emphysema (ICD10-J43.9). Electronically Signed   By: Ted Mcalpine M.D.   On: 12/15/2017 17:07    Procedures Procedures (including critical care time)  Medications Ordered in ED Medications - No data to display   Initial Impression / Assessment and Plan / ED Course  I have reviewed the triage vital signs and the nursing notes.  Pertinent labs & imaging results that were  available during my care of the patient were reviewed by me and considered in my medical decision making (see chart for details).  Clinical Course as of Dec 16 1738  Wed Dec 15, 2017  [id number] 77 year old male here with productive cough and some shortness of breath with exertion.  He otherwise has been feeling fairly well and he just wants to blame it on deconditioning.  On lab work he is got a slight elevation his creatinine but normal white count.  Troponin negative.  D-dimer is positive and he  underwent a CT that did not show an obvious PE but did show some groundglass.  There reading was infectious versus inflammatory and he will likely need to be treated with antibiotics and close follow-up with his primary.   [MB]    Clinical Course User Index [MB] Terrilee FilesButler, Michael C, MD    Patient seen and examined.  Patient with left-sided chest pain, cough, pleuritic symptoms.  Given recent flights and car travel, concerning for possible PE.  He did have some exertional shortness of breath earlier this morning while working in his yard.  Will get labs including d-dimer, troponin, get chest x-ray.  D-dimer elevated.  Otherwise labs are unremarkable.  Will get a CT angios to rule out PE.   5:40 PM Patient CT scan is negative for PE, however shows some bilateral lung groundglass opacities, consistent with inflammation versus infectious cause.  Given his symptoms, I will start him on doxycycline.  I will have him follow-up with his doctor in 2 days.  We discussed return precautions.  His vital signs are normal here, he is stable for outpatient treatment.  Vitals:   12/15/17 1530 12/15/17 1600 12/15/17 1630 12/15/17 1720  BP: 125/74 119/61 116/68 (!) 143/78  Pulse: 68 70 (!) 57 63  Resp: 20 18 (!) 21 18  Temp:      SpO2: 94% 96% 92% 100%  Weight:      Height:          Final Clinical Impressions(s) / ED Diagnoses   Final diagnoses:  Community acquired pneumonia, unspecified laterality    ED Discharge Orders        Ordered    doxycycline (VIBRAMYCIN) 100 MG capsule  2 times daily     12/15/17 1741    benzonatate (TESSALON) 100 MG capsule  Every 8 hours     12/15/17 1741       Jaynie CrumbleKirichenko, Trashawn Oquendo, PA-C 12/15/17 1743    Terrilee FilesButler, Michael C, MD 12/16/17 [phone]

## 2017-12-15 NOTE — ED Notes (Signed)
Patient transported to X-ray 

## 2017-12-15 NOTE — Discharge Instructions (Addendum)
Take doxycycline as prescribed until all gone for the infection.  Take Tessalon as prescribed as needed for cough.  You may take Robitussin as well for some cough relief.  Make sure you drink plenty of fluids.  Rest.  Follow-up with your doctor in 2 days, return if worsening

## 2018-02-08 DIAGNOSIS — E11319 Type 2 diabetes mellitus with unspecified diabetic retinopathy without macular edema: Secondary | ICD-10-CM | POA: Diagnosis not present

## 2018-02-08 DIAGNOSIS — H40053 Ocular hypertension, bilateral: Secondary | ICD-10-CM | POA: Diagnosis not present

## 2018-02-08 DIAGNOSIS — H40013 Open angle with borderline findings, low risk, bilateral: Secondary | ICD-10-CM | POA: Diagnosis not present

## 2018-02-09 DIAGNOSIS — I10 Essential (primary) hypertension: Secondary | ICD-10-CM | POA: Diagnosis not present

## 2018-02-09 DIAGNOSIS — E1140 Type 2 diabetes mellitus with diabetic neuropathy, unspecified: Secondary | ICD-10-CM | POA: Diagnosis not present

## 2018-02-09 DIAGNOSIS — Z8701 Personal history of pneumonia (recurrent): Secondary | ICD-10-CM | POA: Diagnosis not present

## 2018-07-03 ENCOUNTER — Encounter (HOSPITAL_COMMUNITY): Payer: Self-pay

## 2018-07-03 ENCOUNTER — Inpatient Hospital Stay (HOSPITAL_COMMUNITY)
Admission: EM | Admit: 2018-07-03 | Discharge: 2018-07-06 | DRG: 854 | Disposition: A | Discharge status: Home / Self Care | Payer: Medicare Other | Attending: Family Medicine | Admitting: Family Medicine

## 2018-07-03 DIAGNOSIS — Z880 Allergy status to penicillin: Secondary | ICD-10-CM

## 2018-07-03 DIAGNOSIS — R7989 Other specified abnormal findings of blood chemistry: Secondary | ICD-10-CM

## 2018-07-03 DIAGNOSIS — N390 Urinary tract infection, site not specified: Secondary | ICD-10-CM | POA: Diagnosis not present

## 2018-07-03 DIAGNOSIS — R945 Abnormal results of liver function studies: Secondary | ICD-10-CM | POA: Diagnosis not present

## 2018-07-03 DIAGNOSIS — Z79899 Other long term (current) drug therapy: Secondary | ICD-10-CM

## 2018-07-03 DIAGNOSIS — R935 Abnormal findings on diagnostic imaging of other abdominal regions, including retroperitoneum: Secondary | ICD-10-CM

## 2018-07-03 DIAGNOSIS — R110 Nausea: Secondary | ICD-10-CM | POA: Diagnosis not present

## 2018-07-03 DIAGNOSIS — Z419 Encounter for procedure for purposes other than remedying health state, unspecified: Secondary | ICD-10-CM

## 2018-07-03 DIAGNOSIS — R531 Weakness: Secondary | ICD-10-CM | POA: Diagnosis not present

## 2018-07-03 DIAGNOSIS — I10 Essential (primary) hypertension: Secondary | ICD-10-CM | POA: Diagnosis not present

## 2018-07-03 DIAGNOSIS — Z87891 Personal history of nicotine dependence: Secondary | ICD-10-CM

## 2018-07-03 DIAGNOSIS — R0902 Hypoxemia: Secondary | ICD-10-CM | POA: Diagnosis not present

## 2018-07-03 DIAGNOSIS — M810 Age-related osteoporosis without current pathological fracture: Secondary | ICD-10-CM | POA: Diagnosis present

## 2018-07-03 DIAGNOSIS — K810 Acute cholecystitis: Secondary | ICD-10-CM | POA: Diagnosis present

## 2018-07-03 DIAGNOSIS — K8062 Calculus of gallbladder and bile duct with acute cholecystitis without obstruction: Secondary | ICD-10-CM | POA: Diagnosis present

## 2018-07-03 DIAGNOSIS — M109 Gout, unspecified: Secondary | ICD-10-CM | POA: Diagnosis present

## 2018-07-03 DIAGNOSIS — R231 Pallor: Secondary | ICD-10-CM | POA: Diagnosis not present

## 2018-07-03 DIAGNOSIS — Z881 Allergy status to other antibiotic agents status: Secondary | ICD-10-CM

## 2018-07-03 DIAGNOSIS — R509 Fever, unspecified: Secondary | ICD-10-CM | POA: Diagnosis not present

## 2018-07-03 DIAGNOSIS — N3001 Acute cystitis with hematuria: Secondary | ICD-10-CM

## 2018-07-03 DIAGNOSIS — Z8249 Family history of ischemic heart disease and other diseases of the circulatory system: Secondary | ICD-10-CM

## 2018-07-03 DIAGNOSIS — Z792 Long term (current) use of antibiotics: Secondary | ICD-10-CM

## 2018-07-03 DIAGNOSIS — E119 Type 2 diabetes mellitus without complications: Secondary | ICD-10-CM | POA: Diagnosis present

## 2018-07-03 DIAGNOSIS — E118 Type 2 diabetes mellitus with unspecified complications: Secondary | ICD-10-CM

## 2018-07-03 DIAGNOSIS — A419 Sepsis, unspecified organism: Secondary | ICD-10-CM | POA: Diagnosis present

## 2018-07-03 DIAGNOSIS — R6510 Systemic inflammatory response syndrome (SIRS) of non-infectious origin without acute organ dysfunction: Secondary | ICD-10-CM | POA: Diagnosis present

## 2018-07-03 DIAGNOSIS — Z87440 Personal history of urinary (tract) infections: Secondary | ICD-10-CM

## 2018-07-03 DIAGNOSIS — Z7984 Long term (current) use of oral hypoglycemic drugs: Secondary | ICD-10-CM

## 2018-07-03 DIAGNOSIS — A4151 Sepsis due to Escherichia coli [E. coli]: Principal | ICD-10-CM | POA: Diagnosis present

## 2018-07-03 LAB — URINALYSIS, ROUTINE W REFLEX MICROSCOPIC
Bilirubin Urine: NEGATIVE
Glucose, UA: NEGATIVE mg/dL
Ketones, ur: 5 mg/dL — AB
Nitrite: NEGATIVE
PH: 6 (ref 5.0–8.0)
Protein, ur: NEGATIVE mg/dL
SPECIFIC GRAVITY, URINE: 1.016 (ref 1.005–1.030)
WBC, UA: >50 WBC/hpf — ABNORMAL HIGH (ref 0–5)

## 2018-07-03 MED ORDER — SODIUM CHLORIDE 0.9 % IV SOLN
1.0000 g | Freq: Once | INTRAVENOUS | Status: AC
  Administered 2018-07-03: 1 g via INTRAVENOUS
  Filled 2018-07-03: qty 10

## 2018-07-03 NOTE — ED Triage Notes (Signed)
Pt became hot and clammy around 5pm tonight. Increased odor and frequency, wife states similar symptoms to his previous UTI.

## 2018-07-03 NOTE — ED Notes (Signed)
Bed: PY09 Expected date:  Expected time:  Means of arrival:  Comments: EMS 78 yo male UTI symptoms/febrile x 2 hours/hx UTI-ACLS

## 2018-07-03 NOTE — ED Provider Notes (Signed)
TIME SEEN: 11:09 PM  CHIEF COMPLAINT: "I think I have a UTI"  HPI: Patient is a 78 year old male with history of hypertension, diabetes who presents to the emergency department from home with his family for concerns that he has a UTI.  Family reports that he was clammy and warm today with rigors.  No documented fevers at home.  Has had some mild abdominal discomfort.  No nausea, vomiting or diarrhea.  No dysuria or hematuria but had similar symptoms with previous UTI.  Family states they are concerned because he has had urosepsis and become unresponsive before from UTIs.  Any other symptoms such as cough, sore throat, body aches.  ROS: See HPI Constitutional: no fever  Eyes: no drainage  ENT: no runny nose   Cardiovascular:  no chest pain  Resp: no SOB  GI: no vomiting GU: no dysuria Integumentary: no rash  Allergy: no hives  Musculoskeletal: no leg swelling  Neurological: no slurred speech ROS otherwise negative  PAST MEDICAL HISTORY/PAST SURGICAL HISTORY:  Past Medical History:  Diagnosis Date  . Arthritis   . Chronic urinary tract infection   . Diabetes mellitus without complication (HCC)   . Gout   . Hypertension   . Osteoporosis     MEDICATIONS:  Prior to Admission medications   Medication Sig Start Date End Date Taking? Authorizing Provider  amLODipine (NORVASC) 10 MG tablet Take 10 mg by mouth daily.    [provider]  benzonatate (TESSALON) 100 MG capsule Take 1 capsule (100 mg total) by mouth every 8 (eight) hours. 12/15/17   Kirichenko, Lemont Fillers, PA-C  colchicine 0.6 MG tablet Take 0.6 mg by mouth daily.    [provider]  doxycycline (VIBRAMYCIN) 100 MG capsule Take 1 capsule (100 mg total) by mouth 2 (two) times daily. 12/15/17   Kirichenko, Tatyana, PA-C  glipiZIDE-metformin (METAGLIP) 2.5-500 MG per tablet Take 1 tablet by mouth 2 (two) times daily before a meal.    [provider]  telmisartan (MICARDIS) 80 MG tablet Take 80 mg by mouth  daily.    [provider]    ALLERGIES:  Allergies  Allergen Reactions  . Erythromycin Other (See Comments)    Severe Abdominal Pain  . Penicillins Hives    Has patient had a PCN reaction causing immediate rash, facial/tongue/throat swelling, SOB or lightheadedness with hypotension:  Yes  Has patient had a PCN reaction causing severe rash involving mucus membranes or skin necrosis:  No Has patient had a PCN reaction that required hospitalization: No Has patient had a PCN reaction occurring within the last 10 years: No If all of the above answers are "NO", then may proceed with Cephalosporin use. Tolerated CTX 11/23 admit     SOCIAL HISTORY:  Social History   Tobacco Use  . Smoking status: Former Games developer  . Smokeless tobacco: Never Used  Substance Use Topics  . Alcohol use: Yes    Alcohol/week: 0.0 standard drinks    Comment: social    FAMILY HISTORY: Family History  Problem Relation Age of Onset  . Heart disease Sister     EXAM: BP 113/60 (BP Location: Left Arm)   Pulse 93   Temp 98.8 F (37.1 C) (Oral)   Resp 15   SpO2 94%  CONSTITUTIONAL: Alert and oriented and responds appropriately to questions. Well-appearing; well-nourished, elderly, in no distress HEAD: Normocephalic EYES: Conjunctivae clear, pupils appear equal, EOMI ENT: normal nose; moist mucous membranes NECK: Supple, no meningismus, no nuchal rigidity, no LAD  CARD: RRR; S1 and S2 appreciated; no murmurs, no clicks, no rubs, no gallops RESP: Normal chest excursion without splinting or tachypnea; breath sounds clear and equal bilaterally; no wheezes, no rhonchi, no rales, no hypoxia or respiratory distress, speaking full sentences ABD/GI: Normal bowel sounds; non-distended; soft, tender throughout the abdomen diffusely, no rebound, no guarding, no peritoneal signs, no hepatosplenomegaly, negative Murphy sign BACK:  The back appears normal and is non-tender to palpation, there is no CVA  tenderness EXT: Normal ROM in all joints; non-tender to palpation; no edema; normal capillary refill; no cyanosis, no calf tenderness or swelling    SKIN: Normal color for age and race; warm; no rash NEURO: Moves all extremities equally PSYCH: The patient's mood and manner are appropriate. Grooming and personal hygiene are appropriate.  MEDICAL DECISION MAKING: Patient here with concerns for UTI.  Urine obtained in triage does appear to be infected.  We will send urine culture.  Has previously grown E. coli that was sensitive to cephalosporins.  Will give IV ceftriaxone.  Will obtain labs, blood cultures, lactate given his previous history of urosepsis although he is very well-appearing today.  Rectal temperature is normal.  Patient's abdominal exam is benign.  ED PROGRESS: Patient is now hypotensive with elevated lactate.  Will give IV fluids.  Liver function tests are elevated.  Still has his gallbladder.  Will obtain right upper quadrant ultrasound.  Ultrasound shows distended gallbladder with trace pericholecystic fluid, biliary sludge and stones.  Could be representative of acute early cholecystitis.  Will discuss with general surgery.  Will give IV Flagyl as well as ceftriaxone.  Lactate is not improving despite IV fluids.  Blood pressure has improved significantly.   3:40 AM  D/w Dr. Fredricka Bonine with general surgery.  She agrees with plan and would like medicine admission which I think is reasonable.  Will keep patient n.p.o. and they will see patient in consult this morning.  Patient and family have been updated with plan.   3:54 AM Discussed patient's case with hospitalist, Dr. Toniann Fail.  I have recommended admission and patient (and family if present) agree with this plan. Admitting physician will place admission orders.   I reviewed all nursing notes, vitals, pertinent previous records, EKGs, lab and urine results, imaging (as available).    CRITICAL CARE Performed by: Rochele Raring   Total critical care time: 65 minutes  Critical care time was exclusive of separately billable procedures and treating other patients.  Critical care was necessary to treat or prevent imminent or life-threatening deterioration.  Critical care was time spent personally by me on the following activities: development of treatment plan with patient and/or surrogate as well as nursing, discussions with consultants, evaluation of patient's response to treatment, examination of patient, obtaining history from patient or surrogate, ordering and performing treatments and interventions, ordering and review of laboratory studies, ordering and review of radiographic studies, pulse oximetry and re-evaluation of patient's condition.      Ashan Cueva, Layla Maw, DO 07/04/18 [phone]

## 2018-07-04 ENCOUNTER — Encounter (HOSPITAL_COMMUNITY): Payer: Self-pay | Admitting: Internal Medicine

## 2018-07-04 ENCOUNTER — Other Ambulatory Visit: Payer: Self-pay

## 2018-07-04 ENCOUNTER — Encounter (HOSPITAL_COMMUNITY)
Admission: EM | Disposition: A | Discharge status: Home / Self Care | Payer: Self-pay | Source: Home / Self Care | Attending: Internal Medicine

## 2018-07-04 ENCOUNTER — Inpatient Hospital Stay (HOSPITAL_COMMUNITY): Payer: Medicare Other | Admitting: Certified Registered"

## 2018-07-04 ENCOUNTER — Emergency Department (HOSPITAL_COMMUNITY): Payer: Medicare Other

## 2018-07-04 ENCOUNTER — Inpatient Hospital Stay (HOSPITAL_COMMUNITY): Payer: Medicare Other

## 2018-07-04 DIAGNOSIS — K8010 Calculus of gallbladder with chronic cholecystitis without obstruction: Secondary | ICD-10-CM | POA: Diagnosis not present

## 2018-07-04 DIAGNOSIS — R6510 Systemic inflammatory response syndrome (SIRS) of non-infectious origin without acute organ dysfunction: Secondary | ICD-10-CM | POA: Diagnosis not present

## 2018-07-04 DIAGNOSIS — A4151 Sepsis due to Escherichia coli [E. coli]: Secondary | ICD-10-CM | POA: Diagnosis present

## 2018-07-04 DIAGNOSIS — Z792 Long term (current) use of antibiotics: Secondary | ICD-10-CM | POA: Diagnosis not present

## 2018-07-04 DIAGNOSIS — E860 Dehydration: Secondary | ICD-10-CM | POA: Diagnosis not present

## 2018-07-04 DIAGNOSIS — K838 Other specified diseases of biliary tract: Secondary | ICD-10-CM | POA: Diagnosis not present

## 2018-07-04 DIAGNOSIS — M810 Age-related osteoporosis without current pathological fracture: Secondary | ICD-10-CM | POA: Diagnosis present

## 2018-07-04 DIAGNOSIS — R945 Abnormal results of liver function studies: Secondary | ICD-10-CM | POA: Diagnosis not present

## 2018-07-04 DIAGNOSIS — E119 Type 2 diabetes mellitus without complications: Secondary | ICD-10-CM

## 2018-07-04 DIAGNOSIS — I10 Essential (primary) hypertension: Secondary | ICD-10-CM

## 2018-07-04 DIAGNOSIS — Z79899 Other long term (current) drug therapy: Secondary | ICD-10-CM | POA: Diagnosis not present

## 2018-07-04 DIAGNOSIS — Z8249 Family history of ischemic heart disease and other diseases of the circulatory system: Secondary | ICD-10-CM | POA: Diagnosis not present

## 2018-07-04 DIAGNOSIS — K810 Acute cholecystitis: Secondary | ICD-10-CM | POA: Diagnosis present

## 2018-07-04 DIAGNOSIS — N3001 Acute cystitis with hematuria: Secondary | ICD-10-CM | POA: Diagnosis not present

## 2018-07-04 DIAGNOSIS — Z794 Long term (current) use of insulin: Secondary | ICD-10-CM

## 2018-07-04 DIAGNOSIS — M109 Gout, unspecified: Secondary | ICD-10-CM | POA: Diagnosis present

## 2018-07-04 DIAGNOSIS — Z881 Allergy status to other antibiotic agents status: Secondary | ICD-10-CM | POA: Diagnosis not present

## 2018-07-04 DIAGNOSIS — Z880 Allergy status to penicillin: Secondary | ICD-10-CM | POA: Diagnosis not present

## 2018-07-04 DIAGNOSIS — Z87891 Personal history of nicotine dependence: Secondary | ICD-10-CM | POA: Diagnosis not present

## 2018-07-04 DIAGNOSIS — K8050 Calculus of bile duct without cholangitis or cholecystitis without obstruction: Secondary | ICD-10-CM | POA: Diagnosis not present

## 2018-07-04 DIAGNOSIS — N390 Urinary tract infection, site not specified: Secondary | ICD-10-CM | POA: Diagnosis present

## 2018-07-04 DIAGNOSIS — K8062 Calculus of gallbladder and bile duct with acute cholecystitis without obstruction: Secondary | ICD-10-CM | POA: Diagnosis present

## 2018-07-04 DIAGNOSIS — K8000 Calculus of gallbladder with acute cholecystitis without obstruction: Secondary | ICD-10-CM | POA: Diagnosis not present

## 2018-07-04 DIAGNOSIS — Z7984 Long term (current) use of oral hypoglycemic drugs: Secondary | ICD-10-CM | POA: Diagnosis not present

## 2018-07-04 DIAGNOSIS — R7989 Other specified abnormal findings of blood chemistry: Secondary | ICD-10-CM | POA: Diagnosis not present

## 2018-07-04 DIAGNOSIS — R509 Fever, unspecified: Secondary | ICD-10-CM | POA: Diagnosis not present

## 2018-07-04 DIAGNOSIS — Z87440 Personal history of urinary (tract) infections: Secondary | ICD-10-CM | POA: Diagnosis not present

## 2018-07-04 HISTORY — PX: CHOLECYSTECTOMY: SHX55

## 2018-07-04 LAB — HEPATIC FUNCTION PANEL
ALBUMIN: 3.1 g/dL — AB (ref 3.5–5.0)
ALT: 106 U/L — ABNORMAL HIGH (ref 0–44)
AST: 90 U/L — ABNORMAL HIGH (ref 15–41)
Alkaline Phosphatase: 87 U/L (ref 38–126)
Bilirubin, Direct: 1.2 mg/dL — ABNORMAL HIGH (ref 0.0–0.2)
Indirect Bilirubin: 1.4 mg/dL — ABNORMAL HIGH (ref 0.3–0.9)
Total Bilirubin: 2.6 mg/dL — ABNORMAL HIGH (ref 0.3–1.2)
Total Protein: 5.6 g/dL — ABNORMAL LOW (ref 6.5–8.1)

## 2018-07-04 LAB — CBC
HCT: 41 % (ref 39.0–52.0)
Hemoglobin: 12.8 g/dL — ABNORMAL LOW (ref 13.0–17.0)
MCH: 31.9 pg (ref 26.0–34.0)
MCHC: 31.2 g/dL (ref 30.0–36.0)
MCV: 102.2 fL — ABNORMAL HIGH (ref 80.0–100.0)
Platelets: 247 10*3/uL (ref 150–400)
RBC: 4.01 MIL/uL — AB (ref 4.22–5.81)
RDW: 13.8 % (ref 11.5–15.5)
WBC: 12.9 10*3/uL — ABNORMAL HIGH (ref 4.0–10.5)
nRBC: 0 % (ref 0.0–0.2)

## 2018-07-04 LAB — BASIC METABOLIC PANEL
Anion gap: 9 (ref 5–15)
BUN: 13 mg/dL (ref 8–23)
CO2: 23 mmol/L (ref 22–32)
Calcium: 8.1 mg/dL — ABNORMAL LOW (ref 8.9–10.3)
Chloride: 106 mmol/L (ref 98–111)
Creatinine, Ser: 1.08 mg/dL (ref 0.61–1.24)
GFR calc Af Amer: >60 mL/min (ref >60)
Glucose, Bld: 128 mg/dL — ABNORMAL HIGH (ref 70–99)
POTASSIUM: 4.1 mmol/L (ref 3.5–5.1)
Sodium: 138 mmol/L (ref 135–145)

## 2018-07-04 LAB — CBC WITH DIFFERENTIAL/PLATELET
Abs Immature Granulocytes: 0.07 10*3/uL (ref 0.00–0.07)
Basophils Absolute: 0 10*3/uL (ref 0.0–0.1)
Basophils Relative: 0 %
Eosinophils Absolute: 0 10*3/uL (ref 0.0–0.5)
Eosinophils Relative: 0 %
HEMATOCRIT: 41.6 % (ref 39.0–52.0)
Hemoglobin: 13.2 g/dL (ref 13.0–17.0)
IMMATURE GRANULOCYTES: 1 %
Lymphocytes Relative: 3 %
Lymphs Abs: 0.3 10*3/uL — ABNORMAL LOW (ref 0.7–4.0)
MCH: 32 pg (ref 26.0–34.0)
MCHC: 31.7 g/dL (ref 30.0–36.0)
MCV: 101 fL — ABNORMAL HIGH (ref 80.0–100.0)
Monocytes Absolute: 0.7 10*3/uL (ref 0.1–1.0)
Monocytes Relative: 6 %
NEUTROS PCT: 90 %
Neutro Abs: 10.7 10*3/uL — ABNORMAL HIGH (ref 1.7–7.7)
Platelets: 237 10*3/uL (ref 150–400)
RBC: 4.12 MIL/uL — ABNORMAL LOW (ref 4.22–5.81)
RDW: 13.6 % (ref 11.5–15.5)
WBC: 11.8 10*3/uL — ABNORMAL HIGH (ref 4.0–10.5)
nRBC: 0 % (ref 0.0–0.2)

## 2018-07-04 LAB — BLOOD CULTURE ID PANEL (REFLEXED)
Acinetobacter baumannii: NOT DETECTED
CANDIDA ALBICANS: NOT DETECTED
Candida glabrata: NOT DETECTED
Candida krusei: NOT DETECTED
Candida parapsilosis: NOT DETECTED
Candida tropicalis: NOT DETECTED
Carbapenem resistance: NOT DETECTED
ENTEROBACTERIACEAE SPECIES: DETECTED — AB
Enterobacter cloacae complex: NOT DETECTED
Enterococcus species: NOT DETECTED
Escherichia coli: DETECTED — AB
Haemophilus influenzae: NOT DETECTED
KLEBSIELLA PNEUMONIAE: NOT DETECTED
Klebsiella oxytoca: NOT DETECTED
Listeria monocytogenes: NOT DETECTED
Neisseria meningitidis: NOT DETECTED
PSEUDOMONAS AERUGINOSA: NOT DETECTED
Proteus species: NOT DETECTED
STAPHYLOCOCCUS SPECIES: NOT DETECTED
Serratia marcescens: NOT DETECTED
Staphylococcus aureus (BCID): NOT DETECTED
Streptococcus agalactiae: NOT DETECTED
Streptococcus pneumoniae: NOT DETECTED
Streptococcus pyogenes: NOT DETECTED
Streptococcus species: NOT DETECTED

## 2018-07-04 LAB — COMPREHENSIVE METABOLIC PANEL
ALT: 127 U/L — ABNORMAL HIGH (ref 0–44)
AST: 124 U/L — ABNORMAL HIGH (ref 15–41)
Albumin: 3.8 g/dL (ref 3.5–5.0)
Alkaline Phosphatase: 108 U/L (ref 38–126)
Anion gap: 8 (ref 5–15)
BUN: 14 mg/dL (ref 8–23)
CHLORIDE: 106 mmol/L (ref 98–111)
CO2: 22 mmol/L (ref 22–32)
Calcium: 8.6 mg/dL — ABNORMAL LOW (ref 8.9–10.3)
Creatinine, Ser: 1.05 mg/dL (ref 0.61–1.24)
GFR calc Af Amer: >60 mL/min (ref >60)
GFR calc non Af Amer: >60 mL/min (ref >60)
Glucose, Bld: 155 mg/dL — ABNORMAL HIGH (ref 70–99)
Potassium: 3.5 mmol/L (ref 3.5–5.1)
Sodium: 136 mmol/L (ref 135–145)
Total Bilirubin: 2 mg/dL — ABNORMAL HIGH (ref 0.3–1.2)
Total Protein: 6.6 g/dL (ref 6.5–8.1)

## 2018-07-04 LAB — CBG MONITORING, ED: Glucose-Capillary: 125 mg/dL — ABNORMAL HIGH (ref 70–99)

## 2018-07-04 LAB — GLUCOSE, CAPILLARY
GLUCOSE-CAPILLARY: 152 mg/dL — AB (ref 70–99)
Glucose-Capillary: 130 mg/dL — ABNORMAL HIGH (ref 70–99)
Glucose-Capillary: 104 mg/dL — ABNORMAL HIGH (ref 70–99)
Glucose-Capillary: 123 mg/dL — ABNORMAL HIGH (ref 70–99)
Glucose-Capillary: 163 mg/dL — ABNORMAL HIGH (ref 70–99)

## 2018-07-04 LAB — LACTIC ACID, PLASMA
Lactic Acid, Venous: 2.4 mmol/L (ref 0.5–1.9)
Lactic Acid, Venous: 2.5 mmol/L (ref 0.5–1.9)
Lactic Acid, Venous: 2.5 mmol/L (ref 0.5–1.9)

## 2018-07-04 LAB — MRSA PCR SCREENING: MRSA by PCR: NEGATIVE

## 2018-07-04 SURGERY — LAPAROSCOPIC CHOLECYSTECTOMY WITH INTRAOPERATIVE CHOLANGIOGRAM
Anesthesia: General | Site: Abdomen

## 2018-07-04 MED ORDER — MORPHINE SULFATE (PF) 2 MG/ML IV SOLN
1.0000 mg | INTRAVENOUS | Status: DC | PRN
  Administered 2018-07-04 (×3): 2 mg via INTRAVENOUS
  Administered 2018-07-04: 4 mg via INTRAVENOUS
  Administered 2018-07-04 – 2018-07-05 (×3): 2 mg via INTRAVENOUS
  Filled 2018-07-04 (×4): qty 1
  Filled 2018-07-04: qty 2
  Filled 2018-07-04 (×2): qty 1

## 2018-07-04 MED ORDER — INSULIN ASPART 100 UNIT/ML ~~LOC~~ SOLN
0.0000 [IU] | Freq: Three times a day (TID) | SUBCUTANEOUS | Status: DC
  Administered 2018-07-04: 2 [IU] via SUBCUTANEOUS
  Administered 2018-07-05: 1 [IU] via SUBCUTANEOUS
  Administered 2018-07-05: 2 [IU] via SUBCUTANEOUS
  Administered 2018-07-05: 1 [IU] via SUBCUTANEOUS

## 2018-07-04 MED ORDER — 0.9 % SODIUM CHLORIDE (POUR BTL) OPTIME
TOPICAL | Status: DC | PRN
  Administered 2018-07-04: 1000 mL

## 2018-07-04 MED ORDER — OXYCODONE HCL 5 MG PO TABS
10.0000 mg | ORAL_TABLET | ORAL | Status: DC | PRN
  Administered 2018-07-04 – 2018-07-06 (×2): 10 mg via ORAL
  Filled 2018-07-04 (×2): qty 2

## 2018-07-04 MED ORDER — KETOROLAC TROMETHAMINE 15 MG/ML IJ SOLN
15.0000 mg | Freq: Once | INTRAMUSCULAR | Status: AC
  Administered 2018-07-04: 15 mg via INTRAVENOUS
  Filled 2018-07-04: qty 1

## 2018-07-04 MED ORDER — SODIUM CHLORIDE 0.9 % IV BOLUS (SEPSIS)
1000.0000 mL | Freq: Once | INTRAVENOUS | Status: AC
  Administered 2018-07-04: 1000 mL via INTRAVENOUS

## 2018-07-04 MED ORDER — ONDANSETRON HCL 4 MG/2ML IJ SOLN
INTRAMUSCULAR | Status: DC | PRN
  Administered 2018-07-04: 4 mg via INTRAVENOUS

## 2018-07-04 MED ORDER — LACTATED RINGERS IV SOLN
INTRAVENOUS | Status: DC
  Administered 2018-07-04: 10:00:00 via INTRAVENOUS

## 2018-07-04 MED ORDER — LIDOCAINE 2% (20 MG/ML) 5 ML SYRINGE
INTRAMUSCULAR | Status: AC
  Filled 2018-07-04: qty 5

## 2018-07-04 MED ORDER — PROPOFOL 10 MG/ML IV BOLUS
INTRAVENOUS | Status: DC | PRN
  Administered 2018-07-04: 140 mg via INTRAVENOUS

## 2018-07-04 MED ORDER — MEPERIDINE HCL 50 MG/ML IJ SOLN: 6.2500 mg | INTRAMUSCULAR | Status: DC | PRN

## 2018-07-04 MED ORDER — BUPIVACAINE HCL (PF) 0.25 % IJ SOLN
INTRAMUSCULAR | Status: AC
  Filled 2018-07-04: qty 30

## 2018-07-04 MED ORDER — PROPOFOL 10 MG/ML IV BOLUS
INTRAVENOUS | Status: AC
  Filled 2018-07-04: qty 20

## 2018-07-04 MED ORDER — SODIUM CHLORIDE 0.9 % IV SOLN
INTRAVENOUS | Status: AC
  Administered 2018-07-04: 05:00:00 via INTRAVENOUS

## 2018-07-04 MED ORDER — ADULT MULTIVITAMIN W/MINERALS CH
1.0000 | ORAL_TABLET | Freq: Every day | ORAL | Status: DC
  Administered 2018-07-06: 1 via ORAL
  Filled 2018-07-04: qty 1

## 2018-07-04 MED ORDER — SODIUM CHLORIDE 0.9 % IV SOLN
2.0000 g | INTRAVENOUS | Status: DC
  Administered 2018-07-04 – 2018-07-06 (×3): 2 g via INTRAVENOUS
  Filled 2018-07-04 (×3): qty 2

## 2018-07-04 MED ORDER — OXYCODONE HCL 5 MG PO TABS: 5.0000 mg | ORAL_TABLET | Freq: Once | ORAL | Status: DC | PRN

## 2018-07-04 MED ORDER — ROCURONIUM BROMIDE 10 MG/ML (PF) SYRINGE
PREFILLED_SYRINGE | INTRAVENOUS | Status: DC | PRN
  Administered 2018-07-04: 10 mg via INTRAVENOUS
  Administered 2018-07-04: 30 mg via INTRAVENOUS

## 2018-07-04 MED ORDER — IOPAMIDOL (ISOVUE-300) INJECTION 61%
INTRAVENOUS | Status: AC
  Filled 2018-07-04: qty 50

## 2018-07-04 MED ORDER — FENTANYL CITRATE (PF) 100 MCG/2ML IJ SOLN
INTRAMUSCULAR | Status: AC
  Filled 2018-07-04: qty 2

## 2018-07-04 MED ORDER — SODIUM CHLORIDE 0.9 % IV BOLUS (SEPSIS)
500.0000 mL | Freq: Once | INTRAVENOUS | Status: AC
  Administered 2018-07-04: 500 mL via INTRAVENOUS

## 2018-07-04 MED ORDER — BOOST / RESOURCE BREEZE PO LIQD CUSTOM
1.0000 | Freq: Three times a day (TID) | ORAL | Status: DC
  Administered 2018-07-04 – 2018-07-05 (×2): 1 via ORAL

## 2018-07-04 MED ORDER — ONDANSETRON HCL 4 MG/2ML IJ SOLN
4.0000 mg | Freq: Four times a day (QID) | INTRAMUSCULAR | Status: DC | PRN
  Administered 2018-07-05: 4 mg via INTRAVENOUS

## 2018-07-04 MED ORDER — SUGAMMADEX SODIUM 200 MG/2ML IV SOLN
INTRAVENOUS | Status: AC
  Filled 2018-07-04: qty 2

## 2018-07-04 MED ORDER — DEXAMETHASONE SODIUM PHOSPHATE 10 MG/ML IJ SOLN
INTRAMUSCULAR | Status: AC
  Filled 2018-07-04: qty 1

## 2018-07-04 MED ORDER — LACTATED RINGERS IR SOLN
Status: DC | PRN
  Administered 2018-07-04: 1000 mL

## 2018-07-04 MED ORDER — SUGAMMADEX SODIUM 200 MG/2ML IV SOLN
INTRAVENOUS | Status: DC | PRN
  Administered 2018-07-04: 200 mg via INTRAVENOUS

## 2018-07-04 MED ORDER — IOPAMIDOL (ISOVUE-300) INJECTION 61%
INTRAVENOUS | Status: DC | PRN
  Administered 2018-07-04: 6 mL

## 2018-07-04 MED ORDER — EPHEDRINE SULFATE-NACL 50-0.9 MG/10ML-% IV SOSY
PREFILLED_SYRINGE | INTRAVENOUS | Status: DC | PRN
  Administered 2018-07-04 (×2): 10 mg via INTRAVENOUS
  Administered 2018-07-04: 5 mg via INTRAVENOUS

## 2018-07-04 MED ORDER — METRONIDAZOLE IN NACL 5-0.79 MG/ML-% IV SOLN
500.0000 mg | Freq: Once | INTRAVENOUS | Status: AC
  Administered 2018-07-04: 500 mg via INTRAVENOUS
  Filled 2018-07-04: qty 100

## 2018-07-04 MED ORDER — OXYCODONE HCL 5 MG/5ML PO SOLN: 5.0000 mg | Freq: Once | ORAL | Status: DC | PRN

## 2018-07-04 MED ORDER — METRONIDAZOLE IN NACL 5-0.79 MG/ML-% IV SOLN
500.0000 mg | Freq: Three times a day (TID) | INTRAVENOUS | Status: DC
  Administered 2018-07-04 – 2018-07-05 (×4): 500 mg via INTRAVENOUS
  Filled 2018-07-04 (×4): qty 100

## 2018-07-04 MED ORDER — ONDANSETRON HCL 4 MG/2ML IJ SOLN
INTRAMUSCULAR | Status: AC
  Filled 2018-07-04: qty 2

## 2018-07-04 MED ORDER — LIDOCAINE 2% (20 MG/ML) 5 ML SYRINGE
INTRAMUSCULAR | Status: DC | PRN
  Administered 2018-07-04: 60 mg via INTRAVENOUS

## 2018-07-04 MED ORDER — ACETAMINOPHEN 325 MG PO TABS: 325.0000 mg | ORAL_TABLET | ORAL | Status: DC | PRN

## 2018-07-04 MED ORDER — ACETAMINOPHEN 325 MG PO TABS
650.0000 mg | ORAL_TABLET | Freq: Four times a day (QID) | ORAL | Status: DC | PRN
  Administered 2018-07-04: 650 mg via ORAL
  Filled 2018-07-04: qty 2

## 2018-07-04 MED ORDER — FENTANYL CITRATE (PF) 100 MCG/2ML IJ SOLN
25.0000 ug | INTRAMUSCULAR | Status: DC | PRN
  Administered 2018-07-04: 25 ug via INTRAVENOUS
  Administered 2018-07-04: 50 ug via INTRAVENOUS
  Administered 2018-07-04: 25 ug via INTRAVENOUS

## 2018-07-04 MED ORDER — INSULIN ASPART 100 UNIT/ML ~~LOC~~ SOLN
0.0000 [IU] | SUBCUTANEOUS | Status: DC
  Administered 2018-07-04 (×2): 1 [IU] via SUBCUTANEOUS

## 2018-07-04 MED ORDER — ONDANSETRON HCL 4 MG/2ML IJ SOLN: 4.0000 mg | Freq: Once | INTRAMUSCULAR | Status: DC | PRN

## 2018-07-04 MED ORDER — DEXAMETHASONE SODIUM PHOSPHATE 10 MG/ML IJ SOLN
INTRAMUSCULAR | Status: DC | PRN
  Administered 2018-07-04: 4 mg via INTRAVENOUS

## 2018-07-04 MED ORDER — ACETAMINOPHEN 650 MG RE SUPP: 650.0000 mg | Freq: Four times a day (QID) | RECTAL | Status: DC | PRN

## 2018-07-04 MED ORDER — ACETAMINOPHEN 160 MG/5ML PO SOLN: 325.0000 mg | ORAL | Status: DC | PRN

## 2018-07-04 MED ORDER — ONDANSETRON HCL 4 MG PO TABS: 4.0000 mg | ORAL_TABLET | Freq: Four times a day (QID) | ORAL | Status: DC | PRN

## 2018-07-04 MED ORDER — FENTANYL CITRATE (PF) 250 MCG/5ML IJ SOLN
INTRAMUSCULAR | Status: DC | PRN
  Administered 2018-07-04 (×2): 50 ug via INTRAVENOUS

## 2018-07-04 MED ORDER — EPHEDRINE 5 MG/ML INJ
INTRAVENOUS | Status: AC
  Filled 2018-07-04: qty 10

## 2018-07-04 SURGICAL SUPPLY — 28 items
APPLIER CLIP 5 13 M/L LIGAMAX5 (MISCELLANEOUS) ×3
CABLE HIGH FREQUENCY MONO STRZ (ELECTRODE) ×3 IMPLANT
CHLORAPREP W/TINT 26ML (MISCELLANEOUS) ×3 IMPLANT
CLIP APPLIE 5 13 M/L LIGAMAX5 (MISCELLANEOUS) ×1 IMPLANT
COVER MAYO STAND STRL (DRAPES) ×3 IMPLANT
COVER WAND RF STERILE (DRAPES) IMPLANT
DECANTER SPIKE VIAL GLASS SM (MISCELLANEOUS) ×3 IMPLANT
DERMABOND ADVANCED (GAUZE/BANDAGES/DRESSINGS) ×2
DERMABOND ADVANCED .7 DNX12 (GAUZE/BANDAGES/DRESSINGS) ×1 IMPLANT
DRAPE C-ARM 42X120 X-RAY (DRAPES) ×3 IMPLANT
ELECT REM PT RETURN 15FT ADLT (MISCELLANEOUS) ×3 IMPLANT
GLOVE SURG SIGNA 7.5 PF LTX (GLOVE) ×3 IMPLANT
GOWN STRL REUS W/TWL XL LVL3 (GOWN DISPOSABLE) ×6 IMPLANT
HEMOSTAT SURGICEL 4X8 (HEMOSTASIS) IMPLANT
KIT BASIN OR (CUSTOM PROCEDURE TRAY) ×3 IMPLANT
POUCH SPECIMEN RETRIEVAL 10MM (ENDOMECHANICALS) ×3 IMPLANT
SCISSORS LAP 5X35 DISP (ENDOMECHANICALS) ×3 IMPLANT
SET CHOLANGIOGRAPH MIX (MISCELLANEOUS) ×3 IMPLANT
SET IRRIG TUBING LAPAROSCOPIC (IRRIGATION / IRRIGATOR) ×3 IMPLANT
SET TUBE SMOKE EVAC HIGH FLOW (TUBING) ×3 IMPLANT
SLEEVE XCEL OPT CAN 5 100 (ENDOMECHANICALS) ×6 IMPLANT
SURGICEL SNOW 2X4 (HEMOSTASIS) ×3 IMPLANT
SUT MNCRL AB 4-0 PS2 18 (SUTURE) ×3 IMPLANT
TOWEL OR 17X26 10 PK STRL BLUE (TOWEL DISPOSABLE) ×3 IMPLANT
TOWEL OR NON WOVEN STRL DISP B (DISPOSABLE) ×3 IMPLANT
TRAY LAPAROSCOPIC (CUSTOM PROCEDURE TRAY) ×3 IMPLANT
TROCAR BLADELESS OPT 5 100 (ENDOMECHANICALS) ×3 IMPLANT
TROCAR XCEL BLUNT TIP 100MML (ENDOMECHANICALS) ×3 IMPLANT

## 2018-07-04 NOTE — ED Notes (Signed)
ED TO INPATIENT HANDOFF REPORT  Name/Age/Gender [name] Steele 78 y.o. male  Code Status    Code Status Orders  (From admission, onward)         Start     Ordered   07/04/18 0358  Full code  Continuous     07/04/18 0359        Code Status History    Date Active Date Inactive Code Status Order ID Comments User Context   04/10/2017 0301 04/12/2017 1716 Full Code [id number]  Meredeth Ide, MD ED      Home/SNF/Other Home  Chief Complaint Possible UTI-Like Symptoms  Level of Care/Admitting Diagnosis ED Disposition    ED Disposition Condition Comment   Admit  Hospital Area: [facility] [[id number]]  Level of Care: Telemetry [5]  Admit to tele based on following criteria: Monitor for Ischemic changes  Diagnosis: SIRS (systemic inflammatory response syndrome) [facility]) [[id number]]  Admitting Physician: Eduard Clos [phone]  Attending Physician: Eduard Clos [phone]  Estimated length of stay: past midnight tomorrow  Certification:: I certify this patient will need inpatient services for at least 2 midnights  PT Class (Do Not Modify): Inpatient [101]  PT Acc Code (Do Not Modify): Private [1]       Medical History Past Medical History:  Diagnosis Date  . Arthritis   . Chronic urinary tract infection   . Diabetes mellitus without complication (HCC)   . Gout   . Hypertension   . Osteoporosis     Allergies Allergies  Allergen Reactions  . Erythromycin Other (See Comments)    Severe Abdominal Pain  . Penicillins Hives    Has patient had a PCN reaction causing immediate rash, facial/tongue/throat swelling, SOB or lightheadedness with hypotension:  Yes  Has patient had a PCN reaction causing severe rash involving mucus membranes or skin necrosis:  No Has patient had a PCN reaction that required hospitalization: No Has patient had a PCN reaction occurring within the last 10 years: No If all of the above answers are "NO", then may proceed  with Cephalosporin use. Tolerated CTX 11/23 admit     IV Location/Drains/Wounds Patient Lines/Drains/Airways Status   Active Line/Drains/Airways    Name:   Placement date:   Placement time:   Site:   Days:   Peripheral IV 07/03/18 Right Antecubital   07/03/18    2158    Antecubital   1          Labs/Imaging Results for orders placed or performed during the hospital encounter of 07/03/18 (from the past 48 hour(s))  Lactic acid, plasma     Status: Abnormal   Collection Time: 07/03/18  2:04 AM  Result Value Ref Range   Lactic Acid, Venous 2.5 (HH) 0.5 - 1.9 mmol/L    Comment: CRITICAL RESULT CALLED TO, READ BACK BY AND VERIFIED WITH: S,DOSTER AT 0315 ON 07/04/18 BY A,MOHAMED Performed at [facility], [address], [place], [place] [address]   Urinalysis, Routine w reflex microscopic- may I&O cath if menses     Status: Abnormal   Collection Time: 07/03/18 10:31 PM  Result Value Ref Range   Color, Urine AMBER (A) YELLOW    Comment: BIOCHEMICALS MAY BE AFFECTED BY COLOR   APPearance HAZY (A) CLEAR   Specific Gravity, Urine 1.016 1.005 - 1.030   pH 6.0 5.0 - 8.0   Glucose, UA NEGATIVE NEGATIVE mg/dL   Hgb urine dipstick SMALL (A) NEGATIVE   Bilirubin Urine NEGATIVE NEGATIVE  Ketones, ur 5 (A) NEGATIVE mg/dL   Protein, ur NEGATIVE NEGATIVE mg/dL   Nitrite NEGATIVE NEGATIVE   Leukocytes,Ua LARGE (A) NEGATIVE   RBC / HPF 11-20 0 - 5 RBC/hpf   WBC, UA >50 (H) 0 - 5 WBC/hpf   Bacteria, UA FEW (A) NONE SEEN   Squamous Epithelial / LPF 0-5 0 - 5   Mucus PRESENT     Comment: Performed at [facility], [address], [place], [place] [address]  CBC with Differential     Status: Abnormal   Collection Time: 07/03/18 11:47 PM  Result Value Ref Range   WBC 11.8 (H) 4.0 - 10.5 K/uL   RBC 4.12 (L) 4.22 - 5.81 MIL/uL   Hemoglobin 13.2 13.0 - 17.0 g/dL   HCT 41.3 24.4 - 01.0 %   MCV 101.0 (H) 80.0 - 100.0 fL   MCH 32.0 26.0 - 34.0 pg   MCHC  31.7 30.0 - 36.0 g/dL   RDW 27.2 53.6 - 64.4 %   Platelets 237 150 - 400 K/uL   nRBC 0.0 0.0 - 0.2 %   Neutrophils Relative % 90 %   Neutro Abs 10.7 (H) 1.7 - 7.7 K/uL   Lymphocytes Relative 3 %   Lymphs Abs 0.3 (L) 0.7 - 4.0 K/uL   Monocytes Relative 6 %   Monocytes Absolute 0.7 0.1 - 1.0 K/uL   Eosinophils Relative 0 %   Eosinophils Absolute 0.0 0.0 - 0.5 K/uL   Basophils Relative 0 %   Basophils Absolute 0.0 0.0 - 0.1 K/uL   Immature Granulocytes 1 %   Abs Immature Granulocytes 0.07 0.00 - 0.07 K/uL    Comment: Performed at [facility], [address], [place], [place] [address]  Comprehensive metabolic panel     Status: Abnormal   Collection Time: 07/03/18 11:47 PM  Result Value Ref Range   Sodium 136 135 - 145 mmol/L   Potassium 3.5 3.5 - 5.1 mmol/L   Chloride 106 98 - 111 mmol/L   CO2 22 22 - 32 mmol/L   Glucose, Bld 155 (H) 70 - 99 mg/dL   BUN 14 8 - 23 mg/dL   Creatinine, Ser 2.59 0.61 - 1.24 mg/dL   Calcium 8.6 (L) 8.9 - 10.3 mg/dL   Total Protein 6.6 6.5 - 8.1 g/dL   Albumin 3.8 3.5 - 5.0 g/dL   AST 563 (H) 15 - 41 U/L   ALT 127 (H) 0 - 44 U/L   Alkaline Phosphatase 108 38 - 126 U/L   Total Bilirubin 2.0 (H) 0.3 - 1.2 mg/dL   GFR calc non Af Amer >60 >60 mL/min   GFR calc Af Amer >60 >60 mL/min   Anion gap 8 5 - 15    Comment: Performed at [facility], [place], [address], [place], [place] [address]  Lactic acid, plasma     Status: Abnormal   Collection Time: 07/03/18 11:47 PM  Result Value Ref Range   Lactic Acid, Venous 2.5 (HH) 0.5 - 1.9 mmol/L    Comment: CRITICAL RESULT CALLED TO, READ BACK BY AND VERIFIED WITH: H,Jannetta Massey AT 0036 ON 07/04/18 BY A,MOHAMED Performed at [facility], [address], [place], [place] [address]    US Abdomen Limited Ruq  Result Date: 07/04/2018 CLINICAL DATA:  Elevated liver function tests. EXAM: ULTRASOUND ABDOMEN LIMITED RIGHT UPPER QUADRANT COMPARISON:  CT 10/04/2005  FINDINGS: Gallbladder: The gallbladder is somewhat distended in appearance without mural thickening by trace  pericholecystic fluid. Biliary sludge and a 15 mm calculus are identified within the gallbladder. No biliary dilatation is identified. No sonographic Murphy's was elicited by the technologist. Common bile duct: Diameter: Top normal for age at 7 mm Liver: No focal lesion identified. Within normal limits in parenchymal echogenicity. Portal vein is patent on color Doppler imaging with normal direction of blood flow towards the liver. IMPRESSION: Distended gallbladder with trace pericholecystic fluid, biliary sludge and calculi. Early changes of acute cholecystitis is not excluded. Electronically Signed   By: Tollie Ethavid  Kwon M.D.   On: 07/04/2018 03:11   None  Pending Labs Unresulted Labs (From admission, onward)    Start     Ordered   07/04/18 0500  Basic metabolic panel  Tomorrow morning,   R     07/04/18 0359   07/04/18 0500  CBC  Tomorrow morning,   R     07/04/18 0359   07/03/18 2336  Blood culture (routine x 2)  BLOOD CULTURE X 2,   STAT     07/03/18 2335   07/03/18 2335  Urine culture  ONCE - STAT,   STAT     07/03/18 2335          Vitals/Pain Today's Vitals   07/04/18 0230 07/04/18 0245 07/04/18 0300 07/04/18 0330  BP: 111/62  (!) 120/59 117/61  Pulse: 76 71 76 69  Resp:   18 18  Temp:      TempSrc:      SpO2: 92% 95% 96% 96%  PainSc:        Isolation Precautions No active isolations  Medications Medications  metroNIDAZOLE (FLAGYL) IVPB 500 mg (500 mg Intravenous New Bag/Given 07/04/18 0333)  sodium chloride 0.9 % bolus 500 mL (has no administration in time range)  acetaminophen (TYLENOL) tablet 650 mg (has no administration in time range)    Or  acetaminophen (TYLENOL) suppository 650 mg (has no administration in time range)  ondansetron (ZOFRAN) tablet 4 mg (has no administration in time range)    Or  ondansetron (ZOFRAN) injection 4 mg (has no administration in  time range)  insulin aspart (novoLOG) injection 0-9 Units (has no administration in time range)  0.9 %  sodium chloride infusion (has no administration in time range)  cefTRIAXone (ROCEPHIN) 1 g in sodium chloride 0.9 % 100 mL IVPB (has no administration in time range)  metroNIDAZOLE (FLAGYL) IVPB 500 mg (has no administration in time range)  cefTRIAXone (ROCEPHIN) 1 g in sodium chloride 0.9 % 100 mL IVPB (0 g Intravenous Stopped 07/04/18 0029)  sodium chloride 0.9 % bolus 1,000 mL (0 mLs Intravenous Stopped 07/04/18 0203)  sodium chloride 0.9 % bolus 1,000 mL (0 mLs Intravenous Stopped 07/04/18 0321)    Mobility Walks

## 2018-07-04 NOTE — Anesthesia Postprocedure Evaluation (Signed)
Anesthesia Post Note  Patient: [name] Steele  Procedure(s) Performed: LAPAROSCOPIC CHOLECYSTECTOMY WITH INTRAOPERATIVE CHOLANGIOGRAM (N/A Abdomen)     Patient location during evaluation: PACU Anesthesia Type: General Level of consciousness: awake and alert Pain management: pain level controlled Vital Signs Assessment: post-procedure vital signs reviewed and stable Respiratory status: spontaneous breathing, nonlabored ventilation, respiratory function stable and patient connected to nasal cannula oxygen Cardiovascular status: blood pressure returned to baseline and stable Postop Assessment: no apparent nausea or vomiting Anesthetic complications: no    Last Vitals:  Vitals:   07/04/18 1215 07/04/18 1312  BP: 122/70 131/76  Pulse:  85  Resp: 16 16  Temp:  36.4 C  SpO2: (!) 88% 96%    Last Pain:  Vitals:   07/04/18 1312  TempSrc: Oral  PainSc:                  Bettyanne Dittman

## 2018-07-04 NOTE — Anesthesia Preprocedure Evaluation (Addendum)
Anesthesia Evaluation  Patient identified by MRN, date of birth, ID band Patient awake    Reviewed: Allergy & Precautions, H&P , NPO status , Patient's Chart, lab work & pertinent test results, reviewed documented beta blocker date and time   Airway Mallampati: III  TM Distance: >3 FB Neck ROM: full    Dental no notable dental hx. (+) Edentulous Upper, Poor Dentition, Chipped, Missing,    Pulmonary neg pulmonary ROS, former smoker,    Pulmonary exam normal breath sounds clear to auscultation       Cardiovascular Exercise Tolerance: Good hypertension, Pt. on medications negative cardio ROS   Rhythm:regular Rate:Normal     Neuro/Psych negative neurological ROS  negative psych ROS   GI/Hepatic negative GI ROS, Neg liver ROS,   Endo/Other  negative endocrine ROSdiabetes, Type 2  Renal/GU negative Renal ROS  negative genitourinary   Musculoskeletal  (+) Arthritis , Osteoarthritis,    Abdominal   Peds  Hematology negative hematology ROS (+)   Anesthesia Other Findings   Reproductive/Obstetrics negative OB ROS                            Anesthesia Physical Anesthesia Plan  ASA: III  Anesthesia Plan: General   Post-op Pain Management:    Induction: Intravenous  PONV Risk Score and Plan: 2 and Treatment may vary due to age or medical condition and Ondansetron  Airway Management Planned: Oral ETT  Additional Equipment:   Intra-op Plan:   Post-operative Plan: Extubation in OR  Informed Consent: I have reviewed the patients History and Physical, chart, labs and discussed the procedure including the risks, benefits and alternatives for the proposed anesthesia with the patient or authorized representative who has indicated his/her understanding and acceptance.     Dental Advisory Given  Plan Discussed with: CRNA, Anesthesiologist and Surgeon  Anesthesia Plan Comments: (  )        Anesthesia Quick Evaluation

## 2018-07-04 NOTE — Progress Notes (Signed)
CRITICAL VALUE ALERT  Critical Value:  LACTIC 2.4  Date & Time Notied:  07/04/2018 0820  Provider Notified: Butler Denmark  Orders Received/Actions taken:

## 2018-07-04 NOTE — Consult Note (Signed)
Surgical Consultation Requesting provider: Dr. Elesa MassedWard  CC: abdominal pain  HPI: this is a very nice 78 year old man who presented to the emergency department yesterday evening with concerns of a recurrent UTI. He was feeling clammy and warm along with rigors, and also reports a vague mid abdominal discomfort. He states this pain has been going on and off for quite some time and he had never really paid much attention to it. It is not usually associated with nausea although this morning he is having a little bit of nausea.  Apparently he does have a history of urosepsis associated with becoming unresponsive. He has never had any abdominal surgeries. He does have a history of diabetes and hypertension. Workup in the emergency room did indicate a possible urinary tract infection, but he was noted to have a mild elevation in his AST and ALP and total bilirubin. He mentioned the colicky abdominal pain that he has been having and right upper quadrant ultrasound was performed which as below describes likely early cholecystitis. Of note in the emergency room he had a mild lactic acid elevation and had a short bout of hypotension which resolved with fluid resuscitation. He is been running a low-grade temperature throughout his stay.  Allergies  Allergen Reactions  . Erythromycin Other (See Comments)    Severe Abdominal Pain  . Penicillins Hives    Has patient had a PCN reaction causing immediate rash, facial/tongue/throat swelling, SOB or lightheadedness with hypotension:  Yes  Has patient had a PCN reaction causing severe rash involving mucus membranes or skin necrosis:  No Has patient had a PCN reaction that required hospitalization: No Has patient had a PCN reaction occurring within the last 10 years: No If all of the above answers are "NO", then may proceed with Cephalosporin use. Tolerated CTX 11/23 admit     Past Medical History:  Diagnosis Date  . Arthritis   . Chronic urinary tract infection    . Diabetes mellitus without complication (HCC)   . Gout   . Hypertension   . Osteoporosis     Past Surgical History:  Procedure Laterality Date  . JOINT REPLACEMENT Bilateral     Family History  Problem Relation Age of Onset  . Heart disease Sister     Social History   Socioeconomic History  . Marital status: Married    Spouse name: Not on file  . Number of children: Not on file  . Years of education: Not on file  . Highest education level: Not on file  Occupational History  . Not on file  Social Needs  . Financial resource strain: Not on file  . Food insecurity:    Worry: Not on file    Inability: Not on file  . Transportation needs:    Medical: Not on file    Non-medical: Not on file  Tobacco Use  . Smoking status: Former Games developermoker  . Smokeless tobacco: Never Used  Substance and Sexual Activity  . Alcohol use: Yes    Alcohol/week: 0.0 standard drinks    Comment: social  . Drug use: No  . Sexual activity: Not on file  Lifestyle  . Physical activity:    Days per week: Not on file    Minutes per session: Not on file  . Stress: Not on file  Relationships  . Social connections:    Talks on phone: Not on file    Gets together: Not on file    Attends religious service: Not on file  Active member of club or organization: Not on file    Attends meetings of clubs or organizations: Not on file    Relationship status: Not on file  Other Topics Concern  . Not on file  Social History Narrative  . Not on file    No current facility-administered medications on file prior to encounter.    Current Outpatient Medications on File Prior to Encounter  Medication Sig Dispense Refill  . amLODipine (NORVASC) 10 MG tablet Take 10 mg by mouth daily.    . colchicine 0.6 MG tablet Take 0.6 mg by mouth daily as needed (gout).     . metFORMIN (GLUCOPHAGE-XR) 750 MG 24 hr tablet Take 750 mg by mouth daily after breakfast.    . telmisartan (MICARDIS) 80 MG tablet Take 80 mg by  mouth daily.    . benzonatate (TESSALON) 100 MG capsule Take 1 capsule (100 mg total) by mouth every 8 (eight) hours. (Patient not taking: Reported on 07/04/2018) 21 capsule 0  . doxycycline (VIBRAMYCIN) 100 MG capsule Take 1 capsule (100 mg total) by mouth 2 (two) times daily. (Patient not taking: Reported on 07/04/2018) 20 capsule 0    Review of Systems: a complete, 10pt review of systems was completed with pertinent positives and negatives as documented in the HPI  Physical Exam: Vitals:   07/04/18 0445 07/04/18 0505  BP:  130/69  Pulse:  87  Resp:  18  Temp: 99.8 F (37.7 C) 99.6 F (37.6 C)  SpO2:  97%   Gen: A&Ox3, no distress  Head: normocephalic, atraumatic Eyes: extraocular motions intact, anicteric.  Neck: supple without mass or thyromegaly Chest: unlabored respirations, symmetrical air entry, clear bilaterally   Cardiovascular: RRR with palpable distal pulses, no pedal edema Abdomen: soft, nondistended, he is tender in the right upper quadrant and the gallbladder is palpable and focally tender.  Extremities: warm, without edema, no deformities  Neuro: grossly intact Psych: appropriate mood and affect, normal insight  Skin: warm and dry   CBC Latest Ref Rng & Units 07/04/2018 07/03/2018 12/15/2017  WBC 4.0 - 10.5 K/uL 12.9(H) 11.8(H) 7.0  Hemoglobin 13.0 - 17.0 g/dL 12.8(L) 13.2 14.0  Hematocrit 39.0 - 52.0 % 41.0 41.6 41.5  Platelets 150 - 400 K/uL 247 237 247    CMP Latest Ref Rng & Units 07/04/2018 07/03/2018 12/15/2017  Glucose 70 - 99 mg/dL 888(L) 579(J) 97  BUN 8 - 23 mg/dL 13 14 12   Creatinine 0.61 - 1.24 mg/dL 2.82 0.60 1.56(F)  Sodium 135 - 145 mmol/L 138 136 140  Potassium 3.5 - 5.1 mmol/L 4.1 3.5 3.9  Chloride 98 - 111 mmol/L 106 106 103  CO2 22 - 32 mmol/L 23 22 27   Calcium 8.9 - 10.3 mg/dL 8.1(L) 8.6(L) 9.3  Total Protein 6.5 - 8.1 g/dL - 6.6 -  Total Bilirubin 0.3 - 1.2 mg/dL - 2.0(H) -  Alkaline Phos 38 - 126 U/L - 108 -  AST 15 - 41 U/L - 124(H) -   ALT 0 - 44 U/L - 127(H) -    Lab Results  Component Value Date   INR 1.21 04/09/2017    Imaging: US Abdomen Limited Ruq  Result Date: 07/04/2018 CLINICAL DATA:  Elevated liver function tests. EXAM: ULTRASOUND ABDOMEN LIMITED RIGHT UPPER QUADRANT COMPARISON:  CT 10/04/2005 FINDINGS: Gallbladder: The gallbladder is somewhat distended in appearance without mural thickening by trace pericholecystic fluid. Biliary sludge and a 15 mm calculus are identified within the gallbladder. No biliary dilatation is identified. No  sonographic Murphy's was elicited by the technologist. Common bile duct: Diameter: Top normal for age at 7 mm Liver: No focal lesion identified. Within normal limits in parenchymal echogenicity. Portal vein is patent on color Doppler imaging with normal direction of blood flow towards the liver. IMPRESSION: Distended gallbladder with trace pericholecystic fluid, biliary sludge and calculi. Early changes of acute cholecystitis is not excluded. Electronically Signed   By: Tollie Eth M.D.   On: 07/04/2018 03:54    A/P: 78 year old man with acute cholecystitis. I recommend proceeding with laparoscopic cholecystectomy with cholangiogram given mild elevation in bilirubin. Discussed risks of surgery including bleeding, pain, scarring, intraabdominal injury specifically to the common bile duct and sequelae, conversion to open surgery, blood clot, pneumonia, heart attack, stroke, failure to resolve symptoms, etc/ Questions welcomed and answered. Possible OR today with Dr. Magnus Ivan.    Phylliss Blakes, MD [facility], [place] Pager [phone]

## 2018-07-04 NOTE — Progress Notes (Addendum)
[facility]  I have examined the patient and reviewed the chart.  [name] Steele is a 78 y.o. male with history of hypertension, hyperlipidemia, diabetes mellitus and gout presents to the ER because of fever and chills and is found to have acute cholecystitis.    Principal Problem:   Acute cholecystitis/   Sepsis / elevated LFTs - s/p lap cholecystectomy - he has stones in his bile duct and will need an ERCP tomorrow  Active Problems:    Type 2 diabetes mellitus without complication  - cont SSI- Metformin is on hold    Essential hypertension - cont to hold Amlodipine and Telmisartan for now and follow BP  Calvert Cantor, MD Pager: [email or web address]

## 2018-07-04 NOTE — H&P (Signed)
History and Physical    Success Dalley ZSW:[id number] DOB: 1940-07-23 DOA: 07/03/2018  PCP: [name], MD  Patient coming from: Home.  Chief Complaint: Fever chills abdominal pain.  HPI: [name] Steele is a 78 y.o. male with history of hypertension, hyperlipidemia, diabetes mellitus and gout presents to the ER because of fever and chills.  Patient states he symptoms started yesterday morning and also had some right flank pain and patient thought he might have had a urinary tract infection and possible stone in the urinary tract.  Denies nausea vomiting or diarrhea.  ED Course: In the ER patient was febrile with low blood pressure elevated lactate.  LFTs were elevated AST 124 ALT 127 total bilirubin 2.  Mild leukocytosis.  Patient was given 2 L normal saline bolus following which blood pressure improved.  Sonogram the abdomen shows features concerning for acute cholecystitis.  And on exam patient has right upper quadrant tenderness.  On-call general surgeon has been consulted.  Patient admitted for SIRS with possible developing sepsis secondary to acute cholecystitis.  Review of Systems: As per HPI, rest all negative.   Past Medical History:  Diagnosis Date  . Arthritis   . Chronic urinary tract infection   . Diabetes mellitus without complication (HCC)   . Gout   . Hypertension   . Osteoporosis     Past Surgical History:  Procedure Laterality Date  . JOINT REPLACEMENT Bilateral      reports that he has quit smoking. He has never used smokeless tobacco. He reports current alcohol use. He reports that he does not use drugs.  Allergies  Allergen Reactions  . Erythromycin Other (See Comments)    Severe Abdominal Pain  . Penicillins Hives    Has patient had a PCN reaction causing immediate rash, facial/tongue/throat swelling, SOB or lightheadedness with hypotension:  Yes  Has patient had a PCN reaction causing severe rash involving mucus membranes or skin necrosis:  No Has  patient had a PCN reaction that required hospitalization: No Has patient had a PCN reaction occurring within the last 10 years: No If all of the above answers are "NO", then may proceed with Cephalosporin use. Tolerated CTX 11/23 admit     Family History  Problem Relation Age of Onset  . Heart disease Sister     Prior to Admission medications   Medication Sig Start Date End Date Taking? Authorizing Provider  amLODipine (NORVASC) 10 MG tablet Take 10 mg by mouth daily.   Yes [provider]  colchicine 0.6 MG tablet Take 0.6 mg by mouth daily as needed (gout).    Yes [provider]  metFORMIN (GLUCOPHAGE-XR) 750 MG 24 hr tablet Take 750 mg by mouth daily after breakfast. 06/05/18  Yes [provider]  telmisartan (MICARDIS) 80 MG tablet Take 80 mg by mouth daily.   Yes [provider]  benzonatate (TESSALON) 100 MG capsule Take 1 capsule (100 mg total) by mouth every 8 (eight) hours. Patient not taking: Reported on 07/04/2018 12/15/17   Jaynie Crumble, PA-C  doxycycline (VIBRAMYCIN) 100 MG capsule Take 1 capsule (100 mg total) by mouth 2 (two) times daily. Patient not taking: Reported on 07/04/2018 12/15/17   Jaynie Crumble, PA-C    Physical Exam: Vitals:   07/04/18 0200 07/04/18 0230 07/04/18 0245 07/04/18 0300  BP: 117/64 111/62  (!) 120/59  Pulse: 77 76 71 76  Resp: 17   18  Temp:      TempSrc:      SpO2:  93% 92% 95% 96%      Constitutional: Moderately built and nourished. Vitals:   07/04/18 0200 07/04/18 0230 07/04/18 0245 07/04/18 0300  BP: 117/64 111/62  (!) 120/59  Pulse: 77 76 71 76  Resp: 17   18  Temp:      TempSrc:      SpO2: 93% 92% 95% 96%   Eyes: Anicteric no pallor. ENMT: No discharge from the ears eyes nose and mouth. Neck: No mass felt.  No neck rigidity. Respiratory: No rhonchi or crepitations. Cardiovascular: S1-S2 heard. Abdomen: Soft nontender bowel sounds present. Musculoskeletal: No edema.  Joint  effusion. Skin: No rash. Neurologic: Alert awake oriented to time place and person.  Moves all extremities. Psychiatric: Appears normal.  Normal affect.   Labs on Admission: I have personally reviewed following labs and imaging studies  CBC: Recent Labs  Lab 07/03/18 2347  WBC 11.8*  NEUTROABS 10.7*  HGB 13.2  HCT 41.6  MCV 101.0*  PLT 237   Basic Metabolic Panel: Recent Labs  Lab 07/03/18 2347  NA 136  K 3.5  CL 106  CO2 22  GLUCOSE 155*  BUN 14  CREATININE 1.05  CALCIUM 8.6*   GFR: CrCl cannot be calculated (Unknown ideal weight.). Liver Function Tests: Recent Labs  Lab 07/03/18 2347  AST 124*  ALT 127*  ALKPHOS 108  BILITOT 2.0*  PROT 6.6  ALBUMIN 3.8   No results for input(s): LIPASE, AMYLASE in the last 168 hours. No results for input(s): AMMONIA in the last 168 hours. Coagulation Profile: No results for input(s): INR, PROTIME in the last 168 hours. Cardiac Enzymes: No results for input(s): CKTOTAL, CKMB, CKMBINDEX, TROPONINI in the last 168 hours. BNP (last 3 results) No results for input(s): PROBNP in the last 8760 hours. HbA1C: No results for input(s): HGBA1C in the last 72 hours. CBG: No results for input(s): GLUCAP in the last 168 hours. Lipid Profile: No results for input(s): CHOL, HDL, LDLCALC, TRIG, CHOLHDL, LDLDIRECT in the last 72 hours. Thyroid Function Tests: No results for input(s): TSH, T4TOTAL, FREET4, T3FREE, THYROIDAB in the last 72 hours. Anemia Panel: No results for input(s): VITAMINB12, FOLATE, FERRITIN, TIBC, IRON, RETICCTPCT in the last 72 hours. Urine analysis:    Component Value Date/Time   COLORURINE AMBER (A) 07/03/2018 2231   APPEARANCEUR HAZY (A) 07/03/2018 2231   LABSPEC 1.016 07/03/2018 2231   PHURINE 6.0 07/03/2018 2231   GLUCOSEU NEGATIVE 07/03/2018 2231   HGBUR SMALL (A) 07/03/2018 2231   BILIRUBINUR NEGATIVE 07/03/2018 2231   KETONESUR 5 (A) 07/03/2018 2231   PROTEINUR NEGATIVE 07/03/2018 2231    UROBILINOGEN 0.2 10/06/2008 1745   NITRITE NEGATIVE 07/03/2018 2231   LEUKOCYTESUR LARGE (A) 07/03/2018 2231   Sepsis Labs: @LABRCNTIP (procalcitonin:4,lacticidven:4) )No results found for this or any previous visit (from the past 240 hour(s)).   Radiological Exams on Admission: US Abdomen Limited Ruq  Result Date: 07/04/2018 CLINICAL DATA:  Elevated liver function tests. EXAM: ULTRASOUND ABDOMEN LIMITED RIGHT UPPER QUADRANT COMPARISON:  CT 10/04/2005 FINDINGS: Gallbladder: The gallbladder is somewhat distended in appearance without mural thickening by trace pericholecystic fluid. Biliary sludge and a 15 mm calculus are identified within the gallbladder. No biliary dilatation is identified. No sonographic Murphy's was elicited by the technologist. Common bile duct: Diameter: Top normal for age at 7 mm Liver: No focal lesion identified. Within normal limits in parenchymal echogenicity. Portal vein is patent on color Doppler imaging with normal direction of blood flow towards the liver. IMPRESSION: Distended gallbladder with  trace pericholecystic fluid, biliary sludge and calculi. Early changes of acute cholecystitis is not excluded. Electronically Signed   By: Tollie Ethavid  Kwon M.D.   On: 07/04/2018 03:11    Assessment/Plan Principal Problem:   SIRS (systemic inflammatory response syndrome) (HCC) Active Problems:   Type 2 diabetes mellitus without complication (HCC)   Essential hypertension   Acute cholecystitis    1. SIRS with possible developing sepsis secondary to acute cholecystitis -general surgery has been notified.  We will keep patient n.p.o. continue IV fluids empiric antibiotics follow LFTs lactate cultures. 2. Possible UTI on empiric antibiotics for now.  Follow cultures. 3. Hypertension we will hold antihypertensives due to low normal blood pressure on arrival.  Closely follow blood pressure trends. 4. Diabetes mellitus type 2 we will keep patient on sliding scale coverage. 5. History  of gout takes as needed colchicine.   DVT prophylaxis: SCDs in anticipation of possible surgery. Code Status: Full code. Family Communication: Discussed with patient. Disposition Plan: Home. Consults called: General surgery. Admission status: Inpatient.   Eduard ClosArshad N  MD [facility] Pager [phone].  If 7PM-7AM, please contact night-coverage [email or web address] Password [email or web address]  07/04/2018, 3:59 AM

## 2018-07-04 NOTE — Anesthesia Procedure Notes (Signed)
Procedure Name: Intubation Date/Time: 07/04/2018 10:28 AM Performed by: Eben Burow, CRNA Pre-anesthesia Checklist: Patient identified, Emergency Drugs available, Suction available, Patient being monitored and Timeout performed Patient Re-evaluated:Patient Re-evaluated prior to induction Oxygen Delivery Method: Circle system utilized Preoxygenation: Pre-oxygenation with 100% oxygen Induction Type: IV induction Ventilation: Mask ventilation without difficulty Laryngoscope Size: Mac and 4 Grade View: Grade I Tube type: Oral Tube size: 7.5 mm Number of attempts: 1 Airway Equipment and Method: Stylet Placement Confirmation: ETT inserted through vocal cords under direct vision,  positive ETCO2 and breath sounds checked- equal and bilateral Secured at: 22 cm Tube secured with: Tape Dental Injury: Teeth and Oropharynx as per pre-operative assessment

## 2018-07-04 NOTE — ED Notes (Signed)
Date and time results received: 07/04/18 3:16 AM  (use smartphrase ".now" to insert current time)  Test: Lactic  Critical Value: 2.5  Name of Provider Notified: Ward  Orders Received? Or Actions Taken?: Orders Received - See Orders for details

## 2018-07-04 NOTE — Progress Notes (Signed)
Patient returned from s/p. Pt a&ox4, c/o pain 8/10 in abdomen. S/p sites CDI dermabond x5

## 2018-07-04 NOTE — Progress Notes (Signed)
  Pt with acute cholecystitis and gallstones. Plan lap chole with IOC today.  I discussed the procedure in detail.   We discussed the risks and benefits of a laparoscopic cholecystectomy and possible cholangiogram including, but not limited to bleeding, infection, injury to surrounding structures such as the intestine or liver, bile leak, retained gallstones, need to convert to an open procedure, prolonged diarrhea, blood clots such as  DVT, common bile duct injury, anesthesia risks, and possible need for additional procedures.  The likelihood of improvement in symptoms and return to the patient's normal status is good. We discussed the typical post-operative recovery course.

## 2018-07-04 NOTE — ED Notes (Signed)
Date and time results received: 07/04/18 0037 (use smartphrase ".now" to insert current time)  Test: Lactic Acid Critical Value: 2.5  Name of Provider Notified: Ward   Orders Received? Or Actions Taken?: Actions Taken: Provider made aware

## 2018-07-04 NOTE — Progress Notes (Signed)
Initial Nutrition Assessment  DOCUMENTATION CODES:   Not applicable  INTERVENTION:    Boost Breeze po TID, each supplement provides 250 kcal and 9 grams of protein  MVI daily  Obtain admission weight   NUTRITION DIAGNOSIS:   Inadequate oral intake related to poor appetite as evidenced by meal completion < 25%.  GOAL:   Patient will meet greater than or equal to 90% of their needs  MONITOR:   PO intake, Supplement acceptance, Diet advancement, Weight trends, Labs  REASON FOR ASSESSMENT:   Malnutrition Screening Tool    ASSESSMENT:   Patient with PMH significant for HTN, HLD, DM, and gout. Presents this admission with right flank pain. Admitted for sepsis secondary to acute cholecystitis.    2/17- laparoscopic cholecystectomy  Pt having post operative pain upon assessment. Spoke with wife at bedside who reports pt had decreased appetite for >1 month due to on/off pain. During this time period pt would eat a light breakfast and have 50% of his dinner (meat, vegetable, grain). Wife states she noticed a general decline in intake after being admitted with sepsis in Nov 2018. Pt is currently on a clear liquid diet. Discussed the importance of protein intake for preservation of lean body mass. Pt amendable to Boost Breeze.   Wife endorses pt's UBW stays around 200 lb. Records indicate pt weighed 168 lb on 12/15/17. No wt has taken this admission. Suspect pt has lost wt but will need to obtain admission wt to quantify. Nutrition-Focused physical exam completed.   Medications reviewed and include: SS novolog Labs reviewed.   NUTRITION - FOCUSED PHYSICAL EXAM:    Most Recent Value  Orbital Region  No depletion  Upper Arm Region  No depletion  Thoracic and Lumbar Region  Unable to assess  Buccal Region  No depletion  Temple Region  Mild depletion  Clavicle Bone Region  Moderate depletion  Clavicle and Acromion Bone Region  Moderate depletion  Scapular Bone Region  Unable to  assess  Dorsal Hand  Mild depletion  Patellar Region  Mild depletion  Anterior Thigh Region  Mild depletion  Posterior Calf Region  Mild depletion  Edema (RD Assessment)  Mild  Hair  Reviewed  Eyes  Reviewed  Mouth  Reviewed  Skin  Reviewed  Nails  Reviewed     Diet Order:   Diet Order            Diet NPO time specified  Diet effective midnight        Diet clear liquid Room service appropriate? Yes; Fluid consistency: Thin  Diet effective now              EDUCATION NEEDS:   Education needs have been addressed  Skin:  Skin Assessment: Reviewed RN Assessment  Last BM:  2/16  Height:   Ht Readings from Last 1 Encounters:  07/04/18 5\' 9"  (1.753 m)    Weight:   Wt Readings from Last 1 Encounters:  12/15/17 76.2 kg    Ideal Body Weight:  72.7 kg  BMI:  Body mass index is 24.81 kg/m.  Estimated Nutritional Needs:   Kcal:  2150-2350 grams  Protein:  105-120 grams  Fluid:  >/= 2.1 L/day   Vanessa Kick RD, LDN Clinical Nutrition Pager # - [phone]

## 2018-07-04 NOTE — Transfer of Care (Signed)
Immediate Anesthesia Transfer of Care Note  Patient: [name] Steele  Procedure(s) Performed: LAPAROSCOPIC CHOLECYSTECTOMY WITH INTRAOPERATIVE CHOLANGIOGRAM (N/A Abdomen)  Patient Location: PACU  Anesthesia Type:General  Level of Consciousness: awake, alert  and drowsy  Airway & Oxygen Therapy: Patient Spontanous Breathing and Patient connected to face mask oxygen  Post-op Assessment: Report given to RN and Post -op Vital signs reviewed and stable  Post vital signs: Reviewed and stable  Last Vitals:  Vitals Value Taken Time  BP 121/70 07/04/2018 11:39 AM  Temp    Pulse 87 07/04/2018 11:40 AM  Resp    SpO2 100 % 07/04/2018 11:40 AM  Vitals shown include unvalidated device data.  Last Pain:  Vitals:   07/04/18 0950  TempSrc:   PainSc: 0-No pain      Patients Stated Pain Goal: 2 (07/04/18 6962)  Complications: No apparent anesthesia complications

## 2018-07-04 NOTE — Progress Notes (Signed)
PHARMACY - PHYSICIAN COMMUNICATION CRITICAL VALUE ALERT - BLOOD CULTURE IDENTIFICATION (BCID)  [name] Steele is an 78 y.o. male who presented to [facility] on 07/03/2018 with a chief complaint of fever/chills  Assessment: E coli bacteremia likely d/t cholecystitis  Name of physician (or Provider) Contacted: Rizwan  Current antibiotics: Rocephin 2g q24 hr, Flagyl  Changes to prescribed antibiotics recommended:  Patient is on recommended antibiotics - No changes needed  Results for orders placed or performed during the hospital encounter of 07/03/18  Blood Culture ID Panel (Reflexed) (Collected: 07/03/2018 11:47 PM)  Result Value Ref Range   Enterococcus species NOT DETECTED NOT DETECTED   Listeria monocytogenes NOT DETECTED NOT DETECTED   Staphylococcus species NOT DETECTED NOT DETECTED   Staphylococcus aureus (BCID) NOT DETECTED NOT DETECTED   Streptococcus species NOT DETECTED NOT DETECTED   Streptococcus agalactiae NOT DETECTED NOT DETECTED   Streptococcus pneumoniae NOT DETECTED NOT DETECTED   Streptococcus pyogenes NOT DETECTED NOT DETECTED   Acinetobacter baumannii NOT DETECTED NOT DETECTED   Enterobacteriaceae species DETECTED (A) NOT DETECTED   Enterobacter cloacae complex NOT DETECTED NOT DETECTED   Escherichia coli DETECTED (A) NOT DETECTED   Klebsiella oxytoca NOT DETECTED NOT DETECTED   Klebsiella pneumoniae NOT DETECTED NOT DETECTED   Proteus species NOT DETECTED NOT DETECTED   Serratia marcescens NOT DETECTED NOT DETECTED   Carbapenem resistance NOT DETECTED NOT DETECTED   Haemophilus influenzae NOT DETECTED NOT DETECTED   Neisseria meningitidis NOT DETECTED NOT DETECTED   Pseudomonas aeruginosa NOT DETECTED NOT DETECTED   Candida albicans NOT DETECTED NOT DETECTED   Candida glabrata NOT DETECTED NOT DETECTED   Candida krusei NOT DETECTED NOT DETECTED   Candida parapsilosis NOT DETECTED NOT DETECTED   Candida tropicalis NOT DETECTED NOT DETECTED    Shin Lamour,  Canio Winokur A 07/04/2018  4:18 PM

## 2018-07-04 NOTE — Consult Note (Signed)
Subjective:   HPI  78 year old male. Had cholecystectomy today for gall stone and cholecystitis. Intraoperative cholangiogram positive for choledocholithiasis. We were asked  to see for ERCP.     Past Medical History:  Diagnosis Date  . Arthritis   . Chronic urinary tract infection   . Diabetes mellitus without complication (HCC)   . Gout   . Hypertension   . Osteoporosis    Past Surgical History:  Procedure Laterality Date  . JOINT REPLACEMENT Bilateral    Social History   Socioeconomic History  . Marital status: Married    Spouse name: Not on file  . Number of children: Not on file  . Years of education: Not on file  . Highest education level: Not on file  Occupational History  . Not on file  Social Needs  . Financial resource strain: Not on file  . Food insecurity:    Worry: Not on file    Inability: Not on file  . Transportation needs:    Medical: Not on file    Non-medical: Not on file  Tobacco Use  . Smoking status: Former Games developer  . Smokeless tobacco: Never Used  Substance and Sexual Activity  . Alcohol use: Yes    Alcohol/week: 0.0 standard drinks    Comment: social  . Drug use: No  . Sexual activity: Not on file  Lifestyle  . Physical activity:    Days per week: Not on file    Minutes per session: Not on file  . Stress: Not on file  Relationships  . Social connections:    Talks on phone: Not on file    Gets together: Not on file    Attends religious service: Not on file    Active member of club or organization: Not on file    Attends meetings of clubs or organizations: Not on file    Relationship status: Not on file  . Intimate partner violence:    Fear of current or ex partner: Not on file    Emotionally abused: Not on file    Physically abused: Not on file    Forced sexual activity: Not on file  Other Topics Concern  . Not on file  Social History Narrative  . Not on file   family history includes Heart disease in his sister.  Current  Facility-Administered Medications:  .  0.9 %  sodium chloride infusion, , Intravenous, Continuous, Rizwan, Saima, MD, Last Rate: 125 mL/hr at 07/04/18 0441 .  acetaminophen (TYLENOL) tablet 650 mg, 650 mg, Oral, Q6H PRN, 650 mg at 07/04/18 0445 **OR** acetaminophen (TYLENOL) suppository 650 mg, 650 mg, Rectal, Q6H PRN, Abigail Miyamoto, MD .  cefTRIAXone (ROCEPHIN) 2 g in sodium chloride 0.9 % 100 mL IVPB, 2 g, Intravenous, Q24H, Abigail Miyamoto, MD, 2 g at 07/04/18 1029 .  feeding supplement (BOOST / RESOURCE BREEZE) liquid 1 Container, 1 Container, Oral, TID BM, Rizwan, Saima, MD .  fentaNYL (SUBLIMAZE) 100 MCG/2ML injection, , , ,  .  insulin aspart (novoLOG) injection 0-9 Units, 0-9 Units, Subcutaneous, TID WC, Rizwan, Saima, MD .  metroNIDAZOLE (FLAGYL) IVPB 500 mg, 500 mg, Intravenous, Q8H, Abigail Miyamoto, MD, Last Rate: 100 mL/hr at 07/04/18 0841, 500 mg at 07/04/18 0841 .  morphine 2 MG/ML injection 1-4 mg, 1-4 mg, Intravenous, Q1H PRN, Abigail Miyamoto, MD, 2 mg at 07/04/18 1505 .  multivitamin with minerals tablet 1 tablet, 1 tablet, Oral, Daily, Rizwan, Saima, MD .  ondansetron (ZOFRAN) tablet 4 mg, 4 mg,  Oral, Q6H PRN **OR** ondansetron (ZOFRAN) injection 4 mg, 4 mg, Intravenous, Q6H PRN, Abigail Miyamoto, MD .  oxyCODONE (Oxy IR/ROXICODONE) immediate release tablet 10 mg, 10 mg, Oral, Q4H PRN, Abigail Miyamoto, MD Allergies  Allergen Reactions  . Erythromycin Other (See Comments)    Severe Abdominal Pain  . Penicillins Hives    Has patient had a PCN reaction causing immediate rash, facial/tongue/throat swelling, SOB or lightheadedness with hypotension:  Yes  Has patient had a PCN reaction causing severe rash involving mucus membranes or skin necrosis:  No Has patient had a PCN reaction that required hospitalization: No Has patient had a PCN reaction occurring within the last 10 years: No If all of the above answers are "NO", then may proceed with Cephalosporin  use. Tolerated CTX 11/23 admit      Objective:     BP 119/70 (BP Location: Right Arm)   Pulse 88   Temp 97.6 F (36.4 C) (Oral)   Resp 16   Ht 5\' 9"  (1.753 m)   SpO2 96%   BMI 24.81 kg/m   No distress Alert Heart RRR Lungs clear Abdomen, post op  Laboratory No components found for: D1    Assessment:     CBD stones      Plan:     ERCP. Procedure explained along with risks of bleeding, infection and perforation and pancreatitis. Patient agrees to proceed.

## 2018-07-04 NOTE — Op Note (Signed)
Laparoscopic Cholecystectomy with IOC Procedure Note  Indications: This patient presents with symptomatic gallbladder disease and will undergo laparoscopic cholecystectomy.  Pre-operative Diagnosis: cholecystitis with cholelithiasis  Post-operative Diagnosis: Same  Surgeon: Abigail Miyamoto   Assistants: Herbert Pun PA  Anesthesia: General endotracheal anesthesia  ASA Class: 2  Procedure Details  The patient was seen again in the Holding Room. The risks, benefits, complications, treatment options, and expected outcomes were discussed with the patient. The possibilities of reaction to medication, pulmonary aspiration, perforation of viscus, bleeding, recurrent infection, finding a normal gallbladder, the need for additional procedures, failure to diagnose a condition, the possible need to convert to an open procedure, and creating a complication requiring transfusion or operation were discussed with the patient. The likelihood of improving the patient's symptoms with return to their baseline status is good.  The patient and/or family concurred with the proposed plan, giving informed consent. The site of surgery properly noted. The patient was taken to Operating Room, identified as [name] Steele and the procedure verified as Laparoscopic Cholecystectomy with Intraoperative Cholangiogram. A Time Out was held and the above information confirmed.  Prior to the induction of general anesthesia, antibiotic prophylaxis was administered. General endotracheal anesthesia was then administered and tolerated well. After the induction, the abdomen was prepped with Chloraprep and draped in the sterile fashion. The patient was positioned in the supine position.  Local anesthetic agent was injected into the skin near the umbilicus and an incision made. We dissected down to the abdominal fascia with blunt dissection.  The fascia was incised vertically and we entered the peritoneal cavity bluntly.  A pursestring  suture of 0-Vicryl was placed around the fascial opening.  The Hasson cannula was inserted and secured with the stay suture.  Pneumoperitoneum was then created with CO2 and tolerated well without any adverse changes in the patient's vital signs. A 5-mm port was placed in the subxiphoid position.  Two 5-mm ports were placed in the right upper quadrant. All skin incisions were infiltrated with a local anesthetic agent before making the incision and placing the trocars.   We positioned the patient in reverse Trendelenburg, tilted slightly to the patient's left.  The gallbladder was identified and distended.  I had to aspirate bile from it in order to grasp in.  The fundus was then grasped and retracted cephalad. Adhesions were lysed bluntly and with the electrocautery where indicated, taking care not to injure any adjacent organs or viscus. The infundibulum was grasped and retracted laterally, exposing the peritoneum overlying the triangle of Calot. This was then divided and exposed in a blunt fashion. A critical view of the cystic duct and cystic artery was obtained.  The cystic duct was clearly identified and bluntly dissected circumferentially. The cystic duct was ligated with a clip distally.   An incision was made in the cystic duct and the [facility] cholangiogram catheter introduced. The catheter was secured using a clip. A cholangiogram was then obtained which showed good visualization of the distal and proximal biliary tree.  There appeared to be several distal CBD stones causing obstruction.  Contrast barely flowed into the duodenum. The catheter was then removed.   The cystic duct was then ligated with clips and divided. The cystic artery was identified, dissected free, ligated with clips and divided as well.   The gallbladder was dissected from the liver bed in retrograde fashion with the electrocautery. The gallbladder was removed and placed in an Endocatch sac. The liver bed was irrigated and inspected.  Hemostasis  was achieved with the electrocautery and a piece of snow.  Copious irrigation was utilized and was repeatedly aspirated until clear.  The gallbladder and Endocatch sac were then removed through the umbilical port site.  The pursestring suture was used to close the umbilical fascia.    We again inspected the right upper quadrant for hemostasis.  Pneumoperitoneum was released as we removed the trocars.  4-0 Monocryl was used to close the skin.   Benzoin, steri-strips, and clean dressings were applied. The patient was then extubated and brought to the recovery room in stable condition. Instrument, sponge, and needle counts were correct at closure and at the conclusion of the case.   Findings: Cholecystitis with Cholelithiasis. Cholangiogram positive for distal stones  Estimated Blood Loss: Minimal         Drains:          Specimens: Gallbladder           Complications: None; patient tolerated the procedure well.         Disposition: PACU - hemodynamically stable.         Condition: stable

## 2018-07-05 ENCOUNTER — Encounter (HOSPITAL_COMMUNITY): Payer: Self-pay | Admitting: Surgery

## 2018-07-05 ENCOUNTER — Inpatient Hospital Stay (HOSPITAL_COMMUNITY): Payer: Medicare Other | Admitting: Anesthesiology

## 2018-07-05 ENCOUNTER — Inpatient Hospital Stay (HOSPITAL_COMMUNITY): Payer: Medicare Other

## 2018-07-05 ENCOUNTER — Encounter (HOSPITAL_COMMUNITY)
Admission: EM | Disposition: A | Discharge status: Home / Self Care | Payer: Self-pay | Source: Home / Self Care | Attending: Internal Medicine

## 2018-07-05 DIAGNOSIS — R945 Abnormal results of liver function studies: Secondary | ICD-10-CM

## 2018-07-05 DIAGNOSIS — K8050 Calculus of bile duct without cholangitis or cholecystitis without obstruction: Secondary | ICD-10-CM

## 2018-07-05 DIAGNOSIS — E860 Dehydration: Secondary | ICD-10-CM

## 2018-07-05 HISTORY — PX: REMOVAL OF STONES: SHX5545

## 2018-07-05 HISTORY — PX: SPHINCTEROTOMY: SHX5544

## 2018-07-05 HISTORY — PX: ERCP: SHX5425

## 2018-07-05 LAB — COMPREHENSIVE METABOLIC PANEL
ALBUMIN: 2.6 g/dL — AB (ref 3.5–5.0)
ALT: 93 U/L — ABNORMAL HIGH (ref 0–44)
AST: 59 U/L — ABNORMAL HIGH (ref 15–41)
Alkaline Phosphatase: 74 U/L (ref 38–126)
Anion gap: 8 (ref 5–15)
BUN: 16 mg/dL (ref 8–23)
CO2: 21 mmol/L — AB (ref 22–32)
Calcium: 7.9 mg/dL — ABNORMAL LOW (ref 8.9–10.3)
Chloride: 106 mmol/L (ref 98–111)
Creatinine, Ser: 0.93 mg/dL (ref 0.61–1.24)
GFR calc Af Amer: >60 mL/min (ref >60)
GFR calc non Af Amer: >60 mL/min (ref >60)
Glucose, Bld: 187 mg/dL — ABNORMAL HIGH (ref 70–99)
Potassium: 3.7 mmol/L (ref 3.5–5.1)
Sodium: 135 mmol/L (ref 135–145)
Total Bilirubin: 1.1 mg/dL (ref 0.3–1.2)
Total Protein: 5.2 g/dL — ABNORMAL LOW (ref 6.5–8.1)

## 2018-07-05 LAB — CBC
HCT: 38.8 % — ABNORMAL LOW (ref 39.0–52.0)
Hemoglobin: 12.1 g/dL — ABNORMAL LOW (ref 13.0–17.0)
MCH: 31.8 pg (ref 26.0–34.0)
MCHC: 31.2 g/dL (ref 30.0–36.0)
MCV: 102.1 fL — AB (ref 80.0–100.0)
Platelets: 215 10*3/uL (ref 150–400)
RBC: 3.8 MIL/uL — AB (ref 4.22–5.81)
RDW: 14.1 % (ref 11.5–15.5)
WBC: 8.4 10*3/uL (ref 4.0–10.5)
nRBC: 0 % (ref 0.0–0.2)

## 2018-07-05 LAB — GLUCOSE, CAPILLARY
Glucose-Capillary: 140 mg/dL — ABNORMAL HIGH (ref 70–99)
Glucose-Capillary: 144 mg/dL — ABNORMAL HIGH (ref 70–99)
Glucose-Capillary: 171 mg/dL — ABNORMAL HIGH (ref 70–99)
Glucose-Capillary: 203 mg/dL — ABNORMAL HIGH (ref 70–99)
Glucose-Capillary: 215 mg/dL — ABNORMAL HIGH (ref 70–99)
Glucose-Capillary: 219 mg/dL — ABNORMAL HIGH (ref 70–99)

## 2018-07-05 SURGERY — ERCP, WITH INTERVENTION IF INDICATED
Anesthesia: General

## 2018-07-05 MED ORDER — LIDOCAINE 2% (20 MG/ML) 5 ML SYRINGE
INTRAMUSCULAR | Status: DC | PRN
  Administered 2018-07-05: 80 mg via INTRAVENOUS
  Administered 2018-07-05: 40 mg via INTRAVENOUS

## 2018-07-05 MED ORDER — SODIUM CHLORIDE 0.9 % IV SOLN
INTRAVENOUS | Status: DC
  Administered 2018-07-06: 01:00:00 via INTRAVENOUS

## 2018-07-05 MED ORDER — PROPOFOL 10 MG/ML IV BOLUS
INTRAVENOUS | Status: DC | PRN
  Administered 2018-07-05: 130 mg via INTRAVENOUS

## 2018-07-05 MED ORDER — ALBUTEROL SULFATE HFA 108 (90 BASE) MCG/ACT IN AERS
INHALATION_SPRAY | RESPIRATORY_TRACT | Status: DC | PRN
  Administered 2018-07-05: 4 via RESPIRATORY_TRACT

## 2018-07-05 MED ORDER — PHENYLEPHRINE 40 MCG/ML (10ML) SYRINGE FOR IV PUSH (FOR BLOOD PRESSURE SUPPORT)
PREFILLED_SYRINGE | INTRAVENOUS | Status: DC | PRN
  Administered 2018-07-05: 40 ug via INTRAVENOUS
  Administered 2018-07-05 (×2): 80 ug via INTRAVENOUS
  Administered 2018-07-05: 40 ug via INTRAVENOUS
  Administered 2018-07-05 (×2): 80 ug via INTRAVENOUS

## 2018-07-05 MED ORDER — EPHEDRINE SULFATE-NACL 50-0.9 MG/10ML-% IV SOSY
PREFILLED_SYRINGE | INTRAVENOUS | Status: DC | PRN
  Administered 2018-07-05 (×2): 10 mg via INTRAVENOUS

## 2018-07-05 MED ORDER — GLUCAGON HCL RDNA (DIAGNOSTIC) 1 MG IJ SOLR
INTRAMUSCULAR | Status: AC
  Filled 2018-07-05: qty 1

## 2018-07-05 MED ORDER — ACETAMINOPHEN 325 MG PO TABS
650.0000 mg | ORAL_TABLET | Freq: Four times a day (QID) | ORAL | Status: DC
  Administered 2018-07-05 – 2018-07-06 (×4): 650 mg via ORAL
  Filled 2018-07-05 (×4): qty 2

## 2018-07-05 MED ORDER — INDOMETHACIN 50 MG RE SUPP
RECTAL | Status: AC
  Filled 2018-07-05: qty 2

## 2018-07-05 MED ORDER — SODIUM CHLORIDE 0.9 % IV BOLUS
500.0000 mL | Freq: Once | INTRAVENOUS | Status: AC
  Administered 2018-07-05: 500 mL via INTRAVENOUS

## 2018-07-05 MED ORDER — FENTANYL CITRATE (PF) 100 MCG/2ML IJ SOLN
INTRAMUSCULAR | Status: AC
  Filled 2018-07-05: qty 2

## 2018-07-05 MED ORDER — SUGAMMADEX SODIUM 500 MG/5ML IV SOLN
INTRAVENOUS | Status: DC | PRN
  Administered 2018-07-05: 400 mg via INTRAVENOUS

## 2018-07-05 MED ORDER — FENTANYL CITRATE (PF) 100 MCG/2ML IJ SOLN
INTRAMUSCULAR | Status: DC | PRN
  Administered 2018-07-05: 100 ug via INTRAVENOUS

## 2018-07-05 MED ORDER — SODIUM CHLORIDE 0.9 % IV SOLN: INTRAVENOUS | Status: DC

## 2018-07-05 MED ORDER — DEXAMETHASONE SODIUM PHOSPHATE 10 MG/ML IJ SOLN
INTRAMUSCULAR | Status: DC | PRN
  Administered 2018-07-05: 4 mg via INTRAVENOUS

## 2018-07-05 MED ORDER — PROPOFOL 10 MG/ML IV BOLUS
INTRAVENOUS | Status: AC
  Filled 2018-07-05: qty 20

## 2018-07-05 MED ORDER — IBUPROFEN 200 MG PO TABS: 600.0000 mg | ORAL_TABLET | Freq: Four times a day (QID) | ORAL | Status: DC | PRN

## 2018-07-05 MED ORDER — ROCURONIUM BROMIDE 10 MG/ML (PF) SYRINGE
PREFILLED_SYRINGE | INTRAVENOUS | Status: DC | PRN
  Administered 2018-07-05: 20 mg via INTRAVENOUS
  Administered 2018-07-05: 10 mg via INTRAVENOUS
  Administered 2018-07-05: 50 mg via INTRAVENOUS

## 2018-07-05 MED ORDER — LACTATED RINGERS IV SOLN
INTRAVENOUS | Status: DC | PRN
  Administered 2018-07-05 (×2): via INTRAVENOUS

## 2018-07-05 MED ORDER — ALBUTEROL SULFATE HFA 108 (90 BASE) MCG/ACT IN AERS
INHALATION_SPRAY | RESPIRATORY_TRACT | Status: AC
  Filled 2018-07-05: qty 6.7

## 2018-07-05 NOTE — Anesthesia Procedure Notes (Signed)
Procedure Name: Intubation Date/Time: 07/05/2018 1:31 PM Performed by: Lavina Hamman, CRNA Pre-anesthesia Checklist: Patient identified, Emergency Drugs available, Suction available, Patient being monitored and Timeout performed Patient Re-evaluated:Patient Re-evaluated prior to induction Oxygen Delivery Method: Circle system utilized Preoxygenation: Pre-oxygenation with 100% oxygen Induction Type: IV induction Ventilation: Mask ventilation without difficulty Laryngoscope Size: Mac and 4 Grade View: Grade I Tube type: Oral Tube size: 7.0 mm Number of attempts: 1 Airway Equipment and Method: Stylet Placement Confirmation: ETT inserted through vocal cords under direct vision,  positive ETCO2,  CO2 detector and breath sounds checked- equal and bilateral Secured at: 23 cm Tube secured with: Tape Dental Injury: Teeth and Oropharynx as per pre-operative assessment

## 2018-07-05 NOTE — Progress Notes (Addendum)
PROGRESS NOTE    [name] Steele   VZS:[id number]  DOB: 12-30-1940  DOA: 07/03/2018 PCP: [name], MD   Brief Narrative:  [name] Steele  is a 78 y.o.malewithhistory of hypertension, hyperlipidemia, diabetes mellitus and gout presents to the ER because of fever and chills and is found to have acute cholecystitis.     Subjective: He has on and off sharp pain in his right upper abdomen    Assessment & Plan:  Principal Problem:   Acute cholecystitis/   Sepsis / elevated LFTs   Choledocholithiasis  - s/p lap cholecystectomy - he has stones in his bile duct and will need an ERCP which we are awaiting - he is NPO today- urine output is poor (only 100 cc today) will give a bolus of 500 cc NS and then start continuous fluids until able to take orals  E coli bacteremia and UTI -  on Ceftriaxone and Flagyl- d/c Flagyl- await sensitivities    Type 2 diabetes mellitus without complication  - cont SSI- Metformin is on hold    Essential hypertension - cont to hold Amlodipine and Telmisartan for now and follow BP   Time spent in minutes: 35 DVT prophylaxis: SCDs Code Status: Full code Family Communication:  Disposition Plan: f/u on ERCP Consultants:   General surgery  GI Procedures:   Lap chole Antimicrobials:  Anti-infectives (From admission, onward)   Start     Dose/Rate Route Frequency Ordered Stop   07/04/18 1000  cefTRIAXone (ROCEPHIN) 2 g in sodium chloride 0.9 % 100 mL IVPB     2 g 200 mL/hr over 30 Minutes Intravenous Every 24 hours 07/04/18 0359     07/04/18 0800  metroNIDAZOLE (FLAGYL) IVPB 500 mg     500 mg 100 mL/hr over 60 Minutes Intravenous Every 8 hours 07/04/18 0359     07/04/18 0330  metroNIDAZOLE (FLAGYL) IVPB 500 mg     500 mg 100 mL/hr over 60 Minutes Intravenous  Once 07/04/18 0326 07/04/18 0433   07/03/18 2345  cefTRIAXone (ROCEPHIN) 1 g in sodium chloride 0.9 % 100 mL IVPB     1 g 200 mL/hr over 30 Minutes Intravenous  Once 07/03/18  2335 07/04/18 0029       Objective: Vitals:   07/04/18 1312 07/04/18 1429 07/04/18 2040 07/05/18 0432  BP: 131/76 119/70 132/84 123/70  Pulse: 85 88 97 92  Resp: 16 16 20 20   Temp: 97.6 F (36.4 C)  98.8 F (37.1 C) 98.2 F (36.8 C)  TempSrc: Oral  Oral Oral  SpO2: 96%  96% 96%  Weight:    82.9 kg  Height:        Intake/Output Summary (Last 24 hours) at 07/05/2018 1101 Last data filed at 07/05/2018 0720 Gross per 24 hour  Intake 2759.54 ml  Output 575 ml  Net 2184.54 ml   Filed Weights   07/05/18 0432  Weight: 82.9 kg    Examination: General exam: Appears comfortable  HEENT: PERRLA, oral mucosa moist, no sclera icterus or thrush Respiratory system: Clear to auscultation. Respiratory effort normal. Cardiovascular system: S1 & S2 heard, RRR.   Gastrointestinal system: Abdomen soft,  Tender in RUQ, nondistended. Normal bowel sounds. Central nervous system: Alert and oriented. No focal neurological deficits. Extremities: No cyanosis, clubbing or edema Skin: No rashes or ulcers Psychiatry:  Mood & affect appropriate.     Data Reviewed: I have personally reviewed following labs and imaging studies  CBC: Recent Labs  Lab 07/03/18 2347 07/04/18 0442 07/05/18  0531  WBC 11.8* 12.9* 8.4  NEUTROABS 10.7*  --   --   HGB 13.2 12.8* 12.1*  HCT 41.6 41.0 38.8*  MCV 101.0* 102.2* 102.1*  PLT 237 247 215   Basic Metabolic Panel: Recent Labs  Lab 07/03/18 2347 07/04/18 0442 07/05/18 0531  NA 136 138 135  K 3.5 4.1 3.7  CL 106 106 106  CO2 22 23 21*  GLUCOSE 155* 128* 187*  BUN 14 13 16   CREATININE 1.05 1.08 0.93  CALCIUM 8.6* 8.1* 7.9*   GFR: Estimated Creatinine Clearance: 66.5 mL/min (by C-G formula based on SCr of 0.93 mg/dL). Liver Function Tests: Recent Labs  Lab 07/03/18 2347 07/04/18 0714 07/05/18 0531  AST 124* 90* 59*  ALT 127* 106* 93*  ALKPHOS 108 87 74  BILITOT 2.0* 2.6* 1.1  PROT 6.6 5.6* 5.2*  ALBUMIN 3.8 3.1* 2.6*   No results for  input(s): LIPASE, AMYLASE in the last 168 hours. No results for input(s): AMMONIA in the last 168 hours. Coagulation Profile: No results for input(s): INR, PROTIME in the last 168 hours. Cardiac Enzymes: No results for input(s): CKTOTAL, CKMB, CKMBINDEX, TROPONINI in the last 168 hours. BNP (last 3 results) No results for input(s): PROBNP in the last 8760 hours. HbA1C: No results for input(s): HGBA1C in the last 72 hours. CBG: Recent Labs  Lab 07/04/18 1654 07/04/18 2042 07/05/18 0021 07/05/18 0434 07/05/18 0744  GLUCAP 163* 152* 215* 203* 144*   Lipid Profile: No results for input(s): CHOL, HDL, LDLCALC, TRIG, CHOLHDL, LDLDIRECT in the last 72 hours. Thyroid Function Tests: No results for input(s): TSH, T4TOTAL, FREET4, T3FREE, THYROIDAB in the last 72 hours. Anemia Panel: No results for input(s): VITAMINB12, FOLATE, FERRITIN, TIBC, IRON, RETICCTPCT in the last 72 hours. Urine analysis:    Component Value Date/Time   COLORURINE AMBER (A) 07/03/2018 2231   APPEARANCEUR HAZY (A) 07/03/2018 2231   LABSPEC 1.016 07/03/2018 2231   PHURINE 6.0 07/03/2018 2231   GLUCOSEU NEGATIVE 07/03/2018 2231   HGBUR SMALL (A) 07/03/2018 2231   BILIRUBINUR NEGATIVE 07/03/2018 2231   KETONESUR 5 (A) 07/03/2018 2231   PROTEINUR NEGATIVE 07/03/2018 2231   UROBILINOGEN 0.2 10/06/2008 1745   NITRITE NEGATIVE 07/03/2018 2231   LEUKOCYTESUR LARGE (A) 07/03/2018 2231   Sepsis Labs: @LABRCNTIP (procalcitonin:4,lacticidven:4) ) Recent Results (from the past 240 hour(s))  Urine culture     Status: Abnormal (Preliminary result)   Collection Time: 07/03/18 11:47 PM  Result Value Ref Range Status   Specimen Description   Final    URINE, RANDOM Performed at [facility], [address] [place], [place] [address]    Special Requests   Final    NONE Performed at [facility], [address] [place], [place] [address]    Culture >=100,000 COLONIES/mL  ESCHERICHIA COLI (A)  Final   Report Status PENDING  Incomplete  Blood culture (routine x 2)     Status: Abnormal (Preliminary result)   Collection Time: 07/03/18 11:47 PM  Result Value Ref Range Status   Specimen Description   Final    BLOOD RIGHT ANTECUBITAL Performed at [facility], [address] [place], [place] [address]    Special Requests   Final    BOTTLES DRAWN AEROBIC AND ANAEROBIC Blood Culture adequate volume Performed at [facility], [address] [place], [place] [address]    Culture  Setup Time   Final    GRAM NEGATIVE RODS IN BOTH AEROBIC AND ANAEROBIC  BOTTLES CRITICAL RESULT CALLED TO, READ BACK BY AND VERIFIED WITH: D. Wofford PharmD 16:10 07/04/18 ([email or web address])    Culture (A)  Final    ESCHERICHIA COLI SUSCEPTIBILITIES TO FOLLOW Performed at [facility], [address] [place], [place] [address]    Report Status PENDING  Incomplete  Blood Culture ID Panel (Reflexed)     Status: Abnormal   Collection Time: 07/03/18 11:47 PM  Result Value Ref Range Status   Enterococcus species NOT DETECTED NOT DETECTED Final   Listeria monocytogenes NOT DETECTED NOT DETECTED Final   Staphylococcus species NOT DETECTED NOT DETECTED Final   Staphylococcus aureus (BCID) NOT DETECTED NOT DETECTED Final   Streptococcus species NOT DETECTED NOT DETECTED Final   Streptococcus agalactiae NOT DETECTED NOT DETECTED Final   Streptococcus pneumoniae NOT DETECTED NOT DETECTED Final   Streptococcus pyogenes NOT DETECTED NOT DETECTED Final   Acinetobacter baumannii NOT DETECTED NOT DETECTED Final   Enterobacteriaceae species DETECTED (A) NOT DETECTED Final    Comment: Enterobacteriaceae represent a large family of gram-negative bacteria, not a single organism. CRITICAL RESULT CALLED TO, READ BACK BY AND VERIFIED WITH: D. Wofford PharmD 16:10 07/04/18 ([email or web address])    Enterobacter cloacae complex NOT DETECTED NOT DETECTED Final   Escherichia coli  DETECTED (A) NOT DETECTED Final    Comment: CRITICAL RESULT CALLED TO, READ BACK BY AND VERIFIED WITH: D. Wofford PharmD 16:10 07/04/18 ([email or web address])    Klebsiella oxytoca NOT DETECTED NOT DETECTED Final   Klebsiella pneumoniae NOT DETECTED NOT DETECTED Final   Proteus species NOT DETECTED NOT DETECTED Final   Serratia marcescens NOT DETECTED NOT DETECTED Final   Carbapenem resistance NOT DETECTED NOT DETECTED Final   Haemophilus influenzae NOT DETECTED NOT DETECTED Final   Neisseria meningitidis NOT DETECTED NOT DETECTED Final   Pseudomonas aeruginosa NOT DETECTED NOT DETECTED Final   Candida albicans NOT DETECTED NOT DETECTED Final   Candida glabrata NOT DETECTED NOT DETECTED Final   Candida krusei NOT DETECTED NOT DETECTED Final   Candida parapsilosis NOT DETECTED NOT DETECTED Final   Candida tropicalis NOT DETECTED NOT DETECTED Final    Comment: Performed at [facility], [address] [place], [place] [address]  MRSA PCR Screening     Status: None   Collection Time: 07/04/18  9:00 AM  Result Value Ref Range Status   MRSA by PCR NEGATIVE NEGATIVE Final    Comment:        The GeneXpert MRSA Assay (FDA approved for NASAL specimens only), is one component of a comprehensive MRSA colonization surveillance program. It is not intended to diagnose MRSA infection nor to guide or monitor treatment for MRSA infections. Performed at [facility], [address] [place], [place] [address]          Radiology Studies: Dg Cholangiogram Operative  Result Date: 07/04/2018 CLINICAL DATA:  Cholecystectomy EXAM: INTRAOPERATIVE CHOLANGIOGRAM TECHNIQUE: Cholangiographic images from the C-arm fluoroscopic device were submitted for interpretation post-operatively. Please see the procedural report for the amount of contrast and the fluoroscopy time utilized. COMPARISON:  Ultrasound from earlier the same day FINDINGS: At least 3 large filling defects in the distal CBD  which appear occlusive. The proximal CBD and central intrahepatic ducts appear mildly distended. The intrahepatic biliary tree is incompletely opacified. No contrast is seen to flow beyond the ampullary into the duodenum. : 1. Obstructing distal choledocholithiasis. Electronically Signed   By: Corlis Leak  Hassell M.D.   On: 07/04/2018 11:17   [place] Abdomen Limited Ruq  Result Date: 07/04/2018 CLINICAL DATA:  Elevated liver function tests. EXAM: ULTRASOUND ABDOMEN LIMITED RIGHT UPPER QUADRANT COMPARISON:  CT 10/04/2005 FINDINGS: Gallbladder: The gallbladder is somewhat distended in appearance without mural thickening by trace pericholecystic fluid. Biliary sludge and a 15 mm calculus are identified within the gallbladder. No biliary dilatation is identified. No sonographic Murphy's was elicited by the technologist. Common bile duct: Diameter: Top normal for age at 7 mm Liver: No focal lesion identified. Within normal limits in parenchymal echogenicity. Portal vein is patent on color Doppler imaging with normal direction of blood flow towards the liver. IMPRESSION: Distended gallbladder with trace pericholecystic fluid, biliary sludge and calculi. Early changes of acute cholecystitis is not excluded. Electronically Signed   By: Tollie Eth M.D.   On: 07/04/2018 03:11      Scheduled Meds: . acetaminophen  650 mg Oral Q6H  . feeding supplement  1 Container Oral TID BM  . insulin aspart  0-9 Units Subcutaneous TID WC  . multivitamin with minerals  1 tablet Oral Daily   Continuous Infusions: . sodium chloride    . cefTRIAXone (ROCEPHIN)  IV 2 g (07/05/18 1051)  . metronidazole 500 mg (07/05/18 0824)  . sodium chloride       LOS: 1 day      Calvert Cantor, MD [facility] Pager: [email or web address] Password [email or web address] 07/05/2018, 11:01 AM

## 2018-07-05 NOTE — Op Note (Signed)
[facility] Patient Name: [name] Steele Procedure Date: 07/05/2018 MRN: [id number] Attending MD: Vida Rigger , MD Date of Birth: [birth year] CSN: [id number] Age: 78 Admit Type: Inpatient Procedure:                ERCP Indications:              Bile duct stone(s) on Intra-Op cholangiogram Providers:                Vida Rigger, MD, Roselie Awkward, RN, Harrington Challenger,                            Technician, Greig Right, CRNA Referring MD:              Medicines:                General Anesthesia Complications:            No immediate complications. Estimated Blood Loss:     Estimated blood loss: none. Procedure:                Pre-Anesthesia Assessment:                           - Prior to the procedure, a History and Physical                            was performed, and patient medications and                            allergies were reviewed. The patient's tolerance of                            previous anesthesia was also reviewed. The risks                            and benefits of the procedure and the sedation                            options and risks were discussed with the patient.                            All questions were answered, and informed consent                            was obtained. Prior Anticoagulants: The patient has                            taken no previous anticoagulant or antiplatelet                            agents. ASA Grade Assessment: II - A patient with                            mild systemic disease. After reviewing the risks  and benefits, the patient was deemed in                            satisfactory condition to undergo the procedure.                           After obtaining informed consent, the scope was                            passed under direct vision. Throughout the                            procedure, the patient's blood pressure, pulse, and                            oxygen saturations  were monitored continuously. The                            TJF-Q180V ([id number]) Olympus duodenoscope was                            introduced through the mouth, and used to inject                            contrast into and used to cannulate the bile duct.                            The ERCP was somewhat difficult due to abnormal                            anatomy and a difficult angle to cannulate. The                            patient tolerated the procedure well. Scope In: Scope Out: Findings:      The major papilla was normal. Deep selective cannulation was obtained       without any pancreatic duct injection or wire advancement and a few       distal obvious stones were seen and we proceeded with a biliary       sphincterotomy which was made with a Hydratome sphincterotome using ERBE       electrocautery. There was no post-sphincterotomy bleeding. We could get       the fully bowed sphincterotome easily in and out of the duct and       choledocholithiasis was found in a minimally dilated duct. The common       bile duct contained few, the largest of which was medium-sized in       diameter. The biliary tree was swept with a 12 mm balloon initially and       some stones were removed but the wire fell out and we were unable to       recannulate with the balloon catheter so we return to the sphincterotome       and readily cannulated and since we had much better position at this       time we increased the sphincterotomy  size in the customary fashion and       proceeded with a few more balloon pull-through's and the 12 mm balloon       passed readily through the patent sphincterotomy site and we thought a       small stone was not captured by that balloon so we increased the balloon       to 15 mm balloon starting at the upper third of the main bile duct. And       proceeded with a few more balloon pull-through's and again the 15 mm       balloon passed readily through the patent  sphincterotomy site and it       appeared all stones were removed. An occlusion cholangiogram did not       reveal any residual filling defects and the patient had adequate biliary       drainage and the balloon wire and scope was removed and the patient       tolerated the procedure well Impression:               - The major papilla appeared normal.                           - Choledocholithiasis was found. Complete removal                            was accomplished by biliary sphincterotomy and                            balloon extraction.                           - A biliary sphincterotomy was performed x2 as                            above.                           - The biliary tree was swept multiple times. There                            was no pancreatic duct wire advancement or                            injection throughout the procedure Moderate Sedation:      Not Applicable - Patient had care per Anesthesia. Recommendation:           - Clear liquid diet for 6 hours. If doing well at 8                            PM may have soft solids                           - Continue present medications.                           - Return to GI clinic PRN. Customary postop care  per surgical team                           - Telephone GI clinic if symptomatic PRN. Procedure Code(s):        --- Professional ---                           [id number], Endoscopic retrograde                            cholangiopancreatography (ERCP); with removal of                            calculi/debris from biliary/pancreatic duct(s)                           43262, Endoscopic retrograde                            cholangiopancreatography (ERCP); with                            sphincterotomy/papillotomy Diagnosis Code(s):        --- Professional ---                           K80.50, Calculus of bile duct without cholangitis                            or cholecystitis without  obstruction CPT copyright 2018 [employer]. All rights reserved. The codes documented in this report are preliminary and upon coder review may  be revised to meet current compliance requirements. Vida RiggerMarc Thressa Shiffer, MD 07/05/2018 2:15:28 PM This report has been signed electronically. Number of Addenda: 0

## 2018-07-05 NOTE — Progress Notes (Signed)
Patient returned from procedure. See flow sheet VS. and inc remain CDI. Pt reports feeling some better. Will continue to monitor.

## 2018-07-05 NOTE — Discharge Instructions (Signed)
° ° °Managing Your Pain After Surgery Without Opioids ° ° ° °Thank you for participating in our program to help patients manage their pain after surgery without opioids. This is part of our effort to provide you with the best care possible, without exposing you or your family to the risk that opioids pose. ° °What pain can I expect after surgery? °You can expect to have some pain after surgery. This is normal. The pain is typically worse the day after surgery, and quickly begins to get better. °Many studies have found that many patients are able to manage their pain after surgery with Over-the-Counter (OTC) medications such as Tylenol and Motrin. If you have a condition that does not allow you to take Tylenol or Motrin, notify your surgical team. ° °How will I manage my pain? °The best strategy for controlling your pain after surgery is around the clock pain control with Tylenol (acetaminophen) and Motrin (ibuprofen or Advil). Alternating these medications with each other allows you to maximize your pain control. In addition to Tylenol and Motrin, you can use heating pads or ice packs on your incisions to help reduce your pain. ° °How will I alternate your regular strength over-the-counter pain medication? °You will take a dose of pain medication every three hours. °; Start by taking 650 mg of Tylenol (2 pills of 325 mg) °; 3 hours later take 600 mg of Motrin (3 pills of 200 mg) °; 3 hours after taking the Motrin take 650 mg of Tylenol °; 3 hours after that take 600 mg of Motrin. ° ° °- 1 - ° °See example - if your first dose of Tylenol is at 12:00 PM ° ° °12:00 PM Tylenol 650 mg (2 pills of 325 mg)  °3:00 PM Motrin 600 mg (3 pills of 200 mg)  °6:00 PM Tylenol 650 mg (2 pills of 325 mg)  °9:00 PM Motrin 600 mg (3 pills of 200 mg)  °Continue alternating every 3 hours  ° °We recommend that you follow this schedule around-the-clock for at least 3 days after surgery, or until you feel that it is no longer needed. Use  the table on the last page of this handout to keep track of the medications you are taking. °Important: °Do not take more than 3000mg of Tylenol or 3200mg of Motrin in a 24-hour period. °Do not take ibuprofen/Motrin if you have a history of bleeding stomach ulcers, severe kidney disease, &/or actively taking a blood thinner ° °What if I still have pain? °If you have pain that is not controlled with the over-the-counter pain medications (Tylenol and Motrin or Advil) you might have what we call “breakthrough” pain. You will receive a prescription for a small amount of an opioid pain medication such as Oxycodone, Tramadol, or Tylenol with Codeine. Use these opioid pills in the first 24 hours after surgery if you have breakthrough pain. Do not take more than 1 pill every 4-6 hours. ° °If you still have uncontrolled pain after using all opioid pills, don't hesitate to call our staff using the number provided. We will help make sure you are managing your pain in the best way possible, and if necessary, we can provide a prescription for additional pain medication. ° ° °Day 1   ° °Time  °Name of Medication Number of pills taken  °Amount of Acetaminophen  °Pain Level  ° °Comments  °AM PM       °AM PM       °AM PM       °  AM PM       °AM PM       °AM PM       °AM PM       °AM PM       °Total Daily amount of Acetaminophen °Do not take more than  3,000 mg per day    ° ° °Day 2   ° °Time  °Name of Medication Number of pills °taken  °Amount of Acetaminophen  °Pain Level  ° °Comments  °AM PM       °AM PM       °AM PM       °AM PM       °AM PM       °AM PM       °AM PM       °AM PM       °Total Daily amount of Acetaminophen °Do not take more than  3,000 mg per day    ° ° °Day 3   ° °Time  °Name of Medication Number of pills taken  °Amount of Acetaminophen  °Pain Level  ° °Comments  °AM PM       °AM PM       °AM PM       °AM PM       ° ° ° °AM PM       °AM PM       °AM PM       °AM PM       °Total Daily amount of Acetaminophen °Do  not take more than  3,000 mg per day    ° ° °Day 4   ° °Time  °Name of Medication Number of pills taken  °Amount of Acetaminophen  °Pain Level  ° °Comments  °AM PM       °AM PM       °AM PM       °AM PM       °AM PM       °AM PM       °AM PM       °AM PM       °Total Daily amount of Acetaminophen °Do not take more than  3,000 mg per day    ° ° °Day 5   ° °Time  °Name of Medication Number °of pills taken  °Amount of Acetaminophen  °Pain Level  ° °Comments  °AM PM       °AM PM       °AM PM       °AM PM       °AM PM       °AM PM       °AM PM       °AM PM       °Total Daily amount of Acetaminophen °Do not take more than  3,000 mg per day    ° ° ° °Day 6   ° °Time  °Name of Medication Number of pills °taken  °Amount of Acetaminophen  °Pain Level  °Comments  °AM PM       °AM PM       °AM PM       °AM PM       °AM PM       °AM PM       °AM PM       °AM PM       °Total Daily amount of Acetaminophen °Do not take more than    3,000 mg per day    ° ° °Day 7   ° °Time  °Name of Medication Number of pills taken  °Amount of Acetaminophen  °Pain Level  ° °Comments  °AM PM       °AM PM       °AM PM       °AM PM       °AM PM       °AM PM       °AM PM       °AM PM       °Total Daily amount of Acetaminophen °Do not take more than  3,000 mg per day    ° ° ° ° °For additional information about how and where to safely dispose of unused opioid °medications - https://www.morepowerfulnc.org ° °Disclaimer: This document contains information and/or instructional materials adapted from [facility] for the typical patient with your condition. It does not replace medical advice from your health care provider because your experience may differ from that of the °typical patient. Talk to your health care provider if you have any questions about this °document, your condition or your treatment plan. °Adapted from [facility] ° °[facility] °LAPAROSCOPIC SURGERY: POST OP INSTRUCTIONS °Always review your discharge  instruction sheet given to you by the facility where your surgery was performed. °IF YOU HAVE DISABILITY OR FAMILY LEAVE FORMS, YOU MUST BRING THEM TO THE OFFICE FOR PROCESSING.   °DO NOT GIVE THEM TO YOUR DOCTOR. ° °PAIN CONTROL ° °1. First take acetaminophen (Tylenol) AND/or ibuprofen (Advil) to control your pain after surgery.  Follow directions on package.  Taking acetaminophen (Tylenol) and/or ibuprofen (Advil) regularly after surgery will help to control your pain and lower the amount of prescription pain medication you may need.  You should not take more than 3,000 mg (3 grams) of acetaminophen (Tylenol) in 24 hours.  You should not take ibuprofen (Advil), aleve, motrin, naprosyn or other NSAIDS if you have a history of stomach ulcers or chronic kidney disease.  °2. A prescription for pain medication may be given to you upon discharge.  Take your pain medication as prescribed, if you still have uncontrolled pain after taking acetaminophen (Tylenol) or ibuprofen (Advil). °3. Use ice packs to help control pain. °4. If you need a refill on your pain medication, please contact your pharmacy.  They will contact our office to request authorization. Prescriptions will not be filled after 5pm or on week-ends. ° °HOME MEDICATIONS °5. Take your usually prescribed medications unless otherwise directed. ° °DIET °6. You should follow a light diet the first few days after arrival home.  Be sure to include lots of fluids daily. Avoid fatty, fried foods.  ° °CONSTIPATION °7. It is common to experience some constipation after surgery and if you are taking pain medication.  Increasing fluid intake and taking a stool softener (such as Colace) will usually help or prevent this problem from occurring.  A mild laxative (Milk of Magnesia or Miralax) should be taken according to package instructions if there are no bowel movements after 48 hours. ° °WOUND/INCISION CARE °8. Most patients will experience some swelling and bruising in  the area of the incisions.  Ice packs will help.  Swelling and bruising can take several days to resolve.  °9. Unless discharge instructions indicate otherwise, follow guidelines below  °a. STERI-STRIPS - you may remove your outer bandages 48 hours after surgery, and you may shower at that time.  You have steri-strips (  small skin tapes) in place directly over the incision.  These strips should be left on the skin for 7-10 days.   b. DERMABOND/SKIN GLUE - you may shower in 24 hours.  The glue will flake off over the next 2-3 weeks. 10. Any sutures or staples will be removed at the office during your follow-up visit.  ACTIVITIES 11. You may resume regular (light) daily activities beginning the next day--such as daily self-care, walking, climbing stairs--gradually increasing activities as tolerated.  You may have sexual intercourse when it is comfortable.  Refrain from any heavy lifting or straining until approved by your doctor. a. You may drive when you are no longer taking prescription pain medication, you can comfortably wear a seatbelt, and you can safely maneuver your car and apply brakes.  FOLLOW-UP 12. You should see your doctor in the office for a follow-up appointment approximately 2-3 weeks after your surgery.  You should have been given your post-op/follow-up appointment when your surgery was scheduled.  If you did not receive a post-op/follow-up appointment, make sure that you call for this appointment within a day or two after you arrive home to insure a convenient appointment time.   WHEN TO CALL YOUR DOCTOR: 1. Fever over 101.0 2. Inability to urinate 3. Continued bleeding from incision. 4. Increased pain, redness, or drainage from the incision. 5. Increasing abdominal pain  The clinic staff is available to answer your questions during regular business hours.  Please dont hesitate to call and ask to speak to one of the nurses for clinical concerns.  If you have a medical emergency,  go to the nearest emergency room or call 911.  A surgeon from [facility] is always on call at the hospital. [address], [address], [place], [place]  [address] ? [address] [address], [place], [place]   [address] [phone] ? [phone] ? FAX [phone] Web site: [email or web address]    Endoscopic Retrograde Cholangiopancreatogram, Care After This sheet gives you information about how to care for yourself after your procedure. Your health care provider may also give you more specific instructions. If you have problems or questions, contact your health care provider. What can I expect after the procedure? After the procedure, it is common to have:  Soreness in your throat.  Nausea.  Bloating.  Dizziness.  Tiredness (fatigue). Follow these instructions at home:   Take over-the-counter and prescription medicines only as told by your health care provider.  Do not drive for 24 hours if you were given a medicine to help you relax (sedative) during your procedure. Have someone stay with you for 24 hours after the procedure.  Return to your normal activities as told by your health care provider. Ask your health care provider what activities are safe for you.  Return to eating what you normally do as soon as you feel well enough or as told by your health care provider.  Keep all follow-up visits as told by your health care provider. This is important. Contact a health care provider if:  You have pain in your abdomen that does not get better with medicine.  You develop signs of infection, such as: ? Chills. ? Feeling unwell. Get help right away if:  You have difficulty swallowing.  You have worsening pain in your throat, chest, or abdomen.  You vomit bright red blood or a substance that looks like coffee grounds.  You have bloody or very black stools.  You have a fever.  You have a  sudden increase in swelling (bloating) in your  abdomen. Summary  After the procedure, it is common to feel tired and to have some discomfort in your throat.  Contact your health care provider if you have signs of infection--such as chills or feeling unwell--or if you have pain that does not improve with medicine.  Get help right away if you have trouble swallowing, worsening pain, bloody or black vomit, bloody or black stools, a fever, or increased swelling in your abdomen.  Keep all follow-up visits as told by your health care provider. This is important. This information is not intended to replace advice given to you by your health care provider. Make sure you discuss any questions you have with your health care provider. Document Released: 02/22/2013 Document Revised: 03/23/2016 Document Reviewed: 03/23/2016 Elsevier Interactive Patient Education  2019 [employer].

## 2018-07-05 NOTE — Transfer of Care (Signed)
Immediate Anesthesia Transfer of Care Note  Patient: [name] Steele  Procedure(s) Performed: Procedure(s): ENDOSCOPIC RETROGRADE CHOLANGIOPANCREATOGRAPHY (ERCP) (N/A) SPHINCTEROTOMY REMOVAL OF STONES  Patient Location: PACU  Anesthesia Type:General  Level of Consciousness:  sedated, patient cooperative and responds to stimulation  Airway & Oxygen Therapy:Patient Spontanous Breathing and Patient connected to face mask oxgen  Post-op Assessment:  Report given to PACU RN and Post -op Vital signs reviewed and stable  Post vital signs:  Reviewed and stable  Last Vitals:  Vitals:   07/05/18 0432 07/05/18 1256  BP: 123/70 (!) 146/79  Pulse: 92 79  Resp: 20 20  Temp: 36.8 C 36.7 C  SpO2: 96% 96%    Complications: No apparent anesthesia complications

## 2018-07-05 NOTE — Anesthesia Preprocedure Evaluation (Signed)
Anesthesia Evaluation  Patient identified by MRN, date of birth, ID band Patient awake    Reviewed: Allergy & Precautions, NPO status , Patient's Chart, lab work & pertinent test results  Airway Mallampati: II  TM Distance: >3 FB     Dental   Pulmonary former smoker,    breath sounds clear to auscultation       Cardiovascular hypertension,  Rhythm:Regular Rate:Normal     Neuro/Psych    GI/Hepatic negative GI ROS,   Endo/Other  diabetes  Renal/GU      Musculoskeletal   Abdominal   Peds  Hematology   Anesthesia Other Findings   Reproductive/Obstetrics                             Anesthesia Physical Anesthesia Plan  ASA: III  Anesthesia Plan: General   Post-op Pain Management:    Induction:   PONV Risk Score and Plan: Ondansetron, Dexamethasone and Midazolam  Airway Management Planned: Oral ETT  Additional Equipment:   Intra-op Plan:   Post-operative Plan: Possible Post-op intubation/ventilation  Informed Consent:   Plan Discussed with: CRNA and Anesthesiologist  Anesthesia Plan Comments:         Anesthesia Quick Evaluation

## 2018-07-05 NOTE — Anesthesia Postprocedure Evaluation (Signed)
Anesthesia Post Note  Patient: [name] Steele  Procedure(s) Performed: ENDOSCOPIC RETROGRADE CHOLANGIOPANCREATOGRAPHY (ERCP) (N/A ) SPHINCTEROTOMY REMOVAL OF STONES     Patient location during evaluation: PACU Anesthesia Type: General Level of consciousness: awake Pain management: pain level controlled Vital Signs Assessment: post-procedure vital signs reviewed and stable Respiratory status: spontaneous breathing Cardiovascular status: stable Postop Assessment: no apparent nausea or vomiting Anesthetic complications: no    Last Vitals:  Vitals:   07/05/18 1450 07/05/18 1519  BP: 125/63 130/74  Pulse: 84 81  Resp: (!) 25 18  Temp: (!) 36.3 C 36.6 C  SpO2: 95% 94%    Last Pain:  Vitals:   07/05/18 1519  TempSrc: Oral  PainSc:                  Jacoria Keiffer

## 2018-07-05 NOTE — Progress Notes (Signed)
[name] Steele Bechtold 12:51 PM  Subjective: Patient with sore abdomen with movement and he says his pain is the same and his hospital computer chart reviewed including Intra-Op cholangiogram his case discussed with my partner Dr. Evette Cristal as well as his wife  Objective: Vital signs stable afebrile exam please see preassessment evaluation labs okay minimal elevated liver tests ultrasound and IOC reviewed as well as previous CT  Assessment: ABD stones  Plan: The risks benefits methods and success rate of ERCP was discussed with the patient and his wife and will proceed this afternoon with further work-up and plans pending those findings  [facility]  Pager [phone] After 5PM or if no answer call [phone]

## 2018-07-05 NOTE — Progress Notes (Signed)
[facility] Progress Note  1 Day Post-Op  Subjective: CC: abdominal pain Patient reports soreness in RUQ. Denies nausea. Passing flatus. Has not been out of bed yet. Reports that ibuprofen gives him good pain control at home when needed, agreeable to try adding this to pain regimen.   Objective: Vital signs in last 24 hours: Temp:  [97.6 F (36.4 C)-98.9 F (37.2 C)] 98.2 F (36.8 C) (02/18 0432) Pulse Rate:  [68-97] 92 (02/18 0432) Resp:  [12-20] 20 (02/18 0432) BP: (108-132)/(63-84) 123/70 (02/18 0432) SpO2:  [88 %-100 %] 96 % (02/18 0432) Weight:  [82.9 kg] 82.9 kg (02/18 0432) Last BM Date: 07/03/18  Intake/Output from previous day: 02/17 0701 - 02/18 0700 In: 2759.5 [P.O.:390; I.V.:2014; IV Piggyback:355.5] Out: 625 [Urine:600; Blood:25] Intake/Output this shift: Total I/O In: -  Out: 100 [Urine:100]  PE: Gen:  Alert, NAD, pleasant Card:  Regular rate and rhythm Pulm:  Normal effort, clear to auscultation bilaterally Abd: Soft, appropriately TTP in RUQ, non-distended, +BS, no HSM, incisions C/D/I Skin: warm and dry, no rashes  Psych: A&Ox3   Lab Results:  Recent Labs    07/04/18 0442 07/05/18 0531  WBC 12.9* 8.4  HGB 12.8* 12.1*  HCT 41.0 38.8*  PLT 247 215   BMET Recent Labs    07/04/18 0442 07/05/18 0531  NA 138 135  K 4.1 3.7  CL 106 106  CO2 23 21*  GLUCOSE 128* 187*  BUN 13 16  CREATININE 1.08 0.93  CALCIUM 8.1* 7.9*   PT/INR No results for input(s): LABPROT, INR in the last 72 hours. CMP     Component Value Date/Time   NA 135 07/05/2018 0531   K 3.7 07/05/2018 0531   CL 106 07/05/2018 0531   CO2 21 (L) 07/05/2018 0531   GLUCOSE 187 (H) 07/05/2018 0531   BUN 16 07/05/2018 0531   CREATININE 0.93 07/05/2018 0531   CALCIUM 7.9 (L) 07/05/2018 0531   PROT 5.2 (L) 07/05/2018 0531   ALBUMIN 2.6 (L) 07/05/2018 0531   AST 59 (H) 07/05/2018 0531   ALT 93 (H) 07/05/2018 0531   ALKPHOS 74 07/05/2018 0531   BILITOT 1.1  07/05/2018 0531   GFRNONAA >60 07/05/2018 0531   GFRAA >60 07/05/2018 0531   Lipase     Component Value Date/Time   LIPASE 114 10/06/2008 1710       Studies/Results: Dg Cholangiogram Operative  Result Date: 07/04/2018 CLINICAL DATA:  Cholecystectomy EXAM: INTRAOPERATIVE CHOLANGIOGRAM TECHNIQUE: Cholangiographic images from the C-arm fluoroscopic device were submitted for interpretation post-operatively. Please see the procedural report for the amount of contrast and the fluoroscopy time utilized. COMPARISON:  Ultrasound from earlier the same day FINDINGS: At least 3 large filling defects in the distal CBD which appear occlusive. The proximal CBD and central intrahepatic ducts appear mildly distended. The intrahepatic biliary tree is incompletely opacified. No contrast is seen to flow beyond the ampullary into the duodenum. : 1. Obstructing distal choledocholithiasis. Electronically Signed   By: Corlis Leak M.D.   On: 07/04/2018 11:17   US Abdomen Limited Ruq  Result Date: 07/04/2018 CLINICAL DATA:  Elevated liver function tests. EXAM: ULTRASOUND ABDOMEN LIMITED RIGHT UPPER QUADRANT COMPARISON:  CT 10/04/2005 FINDINGS: Gallbladder: The gallbladder is somewhat distended in appearance without mural thickening by trace pericholecystic fluid. Biliary sludge and a 15 mm calculus are identified within the gallbladder. No biliary dilatation is identified. No sonographic Murphy's was elicited by the technologist. Common bile duct: Diameter: Top normal for age at 34  mm Liver: No focal lesion identified. Within normal limits in parenchymal echogenicity. Portal vein is patent on color Doppler imaging with normal direction of blood flow towards the liver. IMPRESSION: Distended gallbladder with trace pericholecystic fluid, biliary sludge and calculi. Early changes of acute cholecystitis is not excluded. Electronically Signed   By: Tollie Eth M.D.   On: 07/04/2018 03:11    Anti-infectives: Anti-infectives  (From admission, onward)   Start     Dose/Rate Route Frequency Ordered Stop   07/04/18 1000  cefTRIAXone (ROCEPHIN) 2 g in sodium chloride 0.9 % 100 mL IVPB     2 g 200 mL/hr over 30 Minutes Intravenous Every 24 hours 07/04/18 0359     07/04/18 0800  metroNIDAZOLE (FLAGYL) IVPB 500 mg     500 mg 100 mL/hr over 60 Minutes Intravenous Every 8 hours 07/04/18 0359     07/04/18 0330  metroNIDAZOLE (FLAGYL) IVPB 500 mg     500 mg 100 mL/hr over 60 Minutes Intravenous  Once 07/04/18 0326 07/04/18 0433   07/03/18 2345  cefTRIAXone (ROCEPHIN) 1 g in sodium chloride 0.9 % 100 mL IVPB     1 g 200 mL/hr over 30 Minutes Intravenous  Once 07/03/18 2335 07/04/18 0029       Assessment/Plan HTN Type II Diabetes Mellitus Hx of gout UTI - urine cx with >100k of G- rods, per primary team  Acute cholecystitis with choledocholithiasis S/p laparoscopic cholecystectomy with IOC 07/04/18 Dr. Magnus Ivan - POD#1 - ERCP today  - mobilize - Advance as tolerated to soft/puree diet after ERCP today - LFTs, Tbili and WBC trending down  - add ibuprofen for pain control  FEN: NPO, IVF VTE: SCDs ID: rocephin 2/16>>, flagyl 2/17>>  LOS: 1 day    Wells Guiles , [facility] 07/05/2018, 8:04 AM Pager: [phone] Consults: [phone]

## 2018-07-06 DIAGNOSIS — A4151 Sepsis due to Escherichia coli [E. coli]: Principal | ICD-10-CM

## 2018-07-06 DIAGNOSIS — N3001 Acute cystitis with hematuria: Secondary | ICD-10-CM

## 2018-07-06 LAB — COMPREHENSIVE METABOLIC PANEL
ALK PHOS: 63 U/L (ref 38–126)
ALT: 69 U/L — ABNORMAL HIGH (ref 0–44)
AST: 35 U/L (ref 15–41)
Albumin: 2.6 g/dL — ABNORMAL LOW (ref 3.5–5.0)
Anion gap: 6 (ref 5–15)
BUN: 16 mg/dL (ref 8–23)
CALCIUM: 8 mg/dL — AB (ref 8.9–10.3)
CO2: 23 mmol/L (ref 22–32)
Chloride: 107 mmol/L (ref 98–111)
Creatinine, Ser: 0.85 mg/dL (ref 0.61–1.24)
GFR calc Af Amer: >60 mL/min (ref >60)
GFR calc non Af Amer: >60 mL/min (ref >60)
Glucose, Bld: 125 mg/dL — ABNORMAL HIGH (ref 70–99)
Potassium: 4 mmol/L (ref 3.5–5.1)
Sodium: 136 mmol/L (ref 135–145)
Total Bilirubin: 1.1 mg/dL (ref 0.3–1.2)
Total Protein: 5.4 g/dL — ABNORMAL LOW (ref 6.5–8.1)

## 2018-07-06 LAB — GLUCOSE, CAPILLARY
Glucose-Capillary: 104 mg/dL — ABNORMAL HIGH (ref 70–99)
Glucose-Capillary: 113 mg/dL — ABNORMAL HIGH (ref 70–99)
Glucose-Capillary: 173 mg/dL — ABNORMAL HIGH (ref 70–99)

## 2018-07-06 LAB — CULTURE, BLOOD (ROUTINE X 2): Special Requests: ADEQUATE

## 2018-07-06 LAB — CBC
HEMATOCRIT: 38.5 % — AB (ref 39.0–52.0)
Hemoglobin: 12 g/dL — ABNORMAL LOW (ref 13.0–17.0)
MCH: 31.5 pg (ref 26.0–34.0)
MCHC: 31.2 g/dL (ref 30.0–36.0)
MCV: 101 fL — ABNORMAL HIGH (ref 80.0–100.0)
Platelets: 207 10*3/uL (ref 150–400)
RBC: 3.81 MIL/uL — ABNORMAL LOW (ref 4.22–5.81)
RDW: 14.1 % (ref 11.5–15.5)
WBC: 8.7 10*3/uL (ref 4.0–10.5)
nRBC: 0 % (ref 0.0–0.2)

## 2018-07-06 LAB — URINE CULTURE: Culture: >=100000 — AB

## 2018-07-06 LAB — LIPASE, BLOOD: Lipase: 22 U/L (ref 11–51)

## 2018-07-06 MED ORDER — IBUPROFEN 600 MG PO TABS: 600.0000 mg | ORAL_TABLET | Freq: Four times a day (QID) | ORAL | 0 refills | Status: DC | PRN

## 2018-07-06 MED ORDER — CEFDINIR 300 MG PO CAPS: 300.0000 mg | ORAL_CAPSULE | Freq: Two times a day (BID) | ORAL | Status: DC

## 2018-07-06 MED ORDER — IBUPROFEN 200 MG PO TABS: 600.0000 mg | ORAL_TABLET | Freq: Four times a day (QID) | ORAL | Status: DC | PRN

## 2018-07-06 MED ORDER — CEFDINIR 300 MG PO CAPS: 300.0000 mg | ORAL_CAPSULE | Freq: Two times a day (BID) | ORAL | 0 refills | Status: DC

## 2018-07-06 MED ORDER — ACETAMINOPHEN 325 MG PO TABS: 650.0000 mg | ORAL_TABLET | Freq: Four times a day (QID) | ORAL | Status: DC | PRN

## 2018-07-06 MED ORDER — OXYCODONE HCL 5 MG PO TABS: 5.0000 mg | ORAL_TABLET | Freq: Four times a day (QID) | ORAL | 0 refills | Status: DC | PRN

## 2018-07-06 NOTE — Progress Notes (Signed)
[facility] Progress Note  1 Day Post-Op  Subjective: CC: soreness Patient reports abdomen is sore. Pain is around 2/10 at rest and then with moving it increases. Denies nausea, passing some flatus. Has not had much to eat yet but keeping down liquids.   Objective: Vital signs in last 24 hours: Temp:  [97.3 F (36.3 C)-98.6 F (37 C)] 98.6 F (37 C) (02/19 0500) Pulse Rate:  [79-91] 84 (02/19 0500) Resp:  [17-25] 20 (02/19 0500) BP: (101-146)/(42-84) 144/84 (02/19 0500) SpO2:  [91 %-100 %] 92 % (02/19 0500) Weight:  [82.9 kg-83.9 kg] 83.9 kg (02/19 0500) Last BM Date: 07/03/18  Intake/Output from previous day: 02/18 0701 - 02/19 0700 In: 1345.2 [I.V.:1245.2; IV Piggyback:100] Out: 625 [Urine:625] Intake/Output this shift: No intake/output data recorded.  PE: Gen:  Alert, NAD, pleasant Card:  Regular rate and rhythm Pulm:  Normal effort, clear to auscultation bilaterally Abd: Soft, appropriately TTP in RUQ, mildly distended, +BS, incisions C/D/I Skin: warm and dry, no rashes  Psych: A&Ox3   Lab Results:  Recent Labs    07/05/18 0531 07/06/18 0548  WBC 8.4 8.7  HGB 12.1* 12.0*  HCT 38.8* 38.5*  PLT 215 207   BMET Recent Labs    07/05/18 0531 07/06/18 0548  NA 135 136  K 3.7 4.0  CL 106 107  CO2 21* 23  GLUCOSE 187* 125*  BUN 16 16  CREATININE 0.93 0.85  CALCIUM 7.9* 8.0*   PT/INR No results for input(s): LABPROT, INR in the last 72 hours. CMP     Component Value Date/Time   NA 136 07/06/2018 0548   K 4.0 07/06/2018 0548   CL 107 07/06/2018 0548   CO2 23 07/06/2018 0548   GLUCOSE 125 (H) 07/06/2018 0548   BUN 16 07/06/2018 0548   CREATININE 0.85 07/06/2018 0548   CALCIUM 8.0 (L) 07/06/2018 0548   PROT 5.4 (L) 07/06/2018 0548   ALBUMIN 2.6 (L) 07/06/2018 0548   AST 35 07/06/2018 0548   ALT 69 (H) 07/06/2018 0548   ALKPHOS 63 07/06/2018 0548   BILITOT 1.1 07/06/2018 0548   GFRNONAA >60 07/06/2018 0548   GFRAA >60 07/06/2018 0548    Lipase     Component Value Date/Time   LIPASE 22 07/06/2018 0548       Studies/Results: Dg Cholangiogram Operative  Result Date: 07/04/2018 CLINICAL DATA:  Cholecystectomy EXAM: INTRAOPERATIVE CHOLANGIOGRAM TECHNIQUE: Cholangiographic images from the C-arm fluoroscopic device were submitted for interpretation post-operatively. Please see the procedural report for the amount of contrast and the fluoroscopy time utilized. COMPARISON:  Ultrasound from earlier the same day FINDINGS: At least 3 large filling defects in the distal CBD which appear occlusive. The proximal CBD and central intrahepatic ducts appear mildly distended. The intrahepatic biliary tree is incompletely opacified. No contrast is seen to flow beyond the ampullary into the duodenum. : 1. Obstructing distal choledocholithiasis. Electronically Signed   By: Corlis Leak M.D.   On: 07/04/2018 11:17   Dg Ercp Biliary & Pancreatic Ducts  Result Date: 07/05/2018 CLINICAL DATA:  ERCP with sphincterotomy and balloon sweeping. EXAM: ERCP TECHNIQUE: Multiple spot images obtained with the fluoroscopic device and submitted for interpretation post-procedure. FLUOROSCOPY TIME:  5 minutes, 54 seconds COMPARISON:  Intraoperative cholangiogram during laparoscopic cholecystectomy-07/04/2018 FINDINGS: Eleven spot fluoroscopic images of the right upper abdominal quadrant during ERCP are provided for review Initial image demonstrates an ERCP probe overlying the right upper abdominal quadrant. Cholecystectomy clips overlies expected location of the gallbladder fossa. There is  selective cannulation and opacification of the common bile duct which appears mildly dilated. Subsequent images demonstrate insufflation of a balloon within the central aspect of the CBD with subsequent presumed biliary sweeping and sphincterotomy. IMPRESSION: ERCP with biliary sweeping and presumed sphincterotomy as above. These images were submitted for radiologic interpretation  only. Please see the procedural report for the amount of contrast and the fluoroscopy time utilized. Electronically Signed   By: Simonne Come M.D.   On: 07/05/2018 14:36    Anti-infectives: Anti-infectives (From admission, onward)   Start     Dose/Rate Route Frequency Ordered Stop   07/04/18 1000  cefTRIAXone (ROCEPHIN) 2 g in sodium chloride 0.9 % 100 mL IVPB     2 g 200 mL/hr over 30 Minutes Intravenous Every 24 hours 07/04/18 0359     07/04/18 0800  metroNIDAZOLE (FLAGYL) IVPB 500 mg  Status:  Discontinued     500 mg 100 mL/hr over 60 Minutes Intravenous Every 8 hours 07/04/18 0359 07/05/18 1111   07/04/18 0330  metroNIDAZOLE (FLAGYL) IVPB 500 mg     500 mg 100 mL/hr over 60 Minutes Intravenous  Once 07/04/18 0326 07/04/18 0433   07/03/18 2345  cefTRIAXone (ROCEPHIN) 1 g in sodium chloride 0.9 % 100 mL IVPB     1 g 200 mL/hr over 30 Minutes Intravenous  Once 07/03/18 2335 07/04/18 0029       Assessment/Plan HTN Type II Diabetes Mellitus Hx of gout UTI - urine cx with >100k of G- rods, per primary team  Acute cholecystitis with choledocholithiasis S/p laparoscopic cholecystectomy with IOC 07/04/18 Dr. Magnus Ivan - POD#2 - s/p ERCP yesterday - mobilize - LFTs, Tbili and WBC trending down  - added ibuprofen for pain control yesterday - no further abx needed from a surgical standpoint - tolerating diet, stable for discharge from a surgery standpoint. Will send Rx for pain medication, follow up in chart.   FEN: soft diet  VTE: SCDs ID: rocephin 2/16>>, flagyl 2/17>2/18  LOS: 2 days    Wells Guiles , [facility] 07/06/2018, 9:31 AM Pager: [phone] Consults: [phone]

## 2018-07-06 NOTE — Discharge Summary (Signed)
Physician Discharge Summary  Parmvir Ure SWN:[id number] DOB: 1940-06-03 DOA: 07/03/2018  PCP: [name], MD  Admit date: 07/03/2018 Discharge date: 07/06/2018  Recommendations for Outpatient Follow-up:  Follow-up cholecystectomy, bacteremia, see discussion below  Follow-up Information    [facility], [facility]. Go on 07/19/2018.   Specialty:  General Surgery Why:  Appointment scheduled for 11:00 AM. Please arrive 30 min prior to appointment time. Bring photo ID and insurance information.  Contact information: [address] [place] [address] [phone]        [name], MD. Schedule an appointment as soon as possible for a visit in 1 week(s).   Specialty:  Internal Medicine Contact information: [address] [place] [address] [place] [address] [phone]            Discharge Diagnoses: Principal diagnosis is #1 1. Sepsis secondary to Escherichia coli bacteremia secondary to Escherichia coli UTI 2. Acute cholecystitis, cholelithiasis, choledocholithiasis 3. Diabetes mellitus type 2 4. Essential hypertension  Discharge Condition: improved Disposition: home  Diet recommendation: per surgery  Filed Weights   07/05/18 0432 07/05/18 1256 07/06/18 0500  Weight: 82.9 kg 82.9 kg 83.9 kg    History of present illness:  78 year old man presented with fever, chills, dysuria.  Noted to have elevated lactate, low blood pressure, elevated LFTs.  Ultrasound suggested acute cholecystitis.  Admitted for treatment of UTI, further evaluation by general surgery.    Hospital Course:  Underwent laparoscopic cholecystectomy, intraoperative cholangiogram revealed choledocholithiasis.  Seen by GI and underwent ERCP. Found to have Escherichia coli bacteremia secondary to UTI.  Responding well to empiric ceftriaxone, discussed with pharmacist, transition to Ceftin.NR on discharge.  Sepsis secondary to Escherichia coli bacteremia secondary to Escherichia  coli UTI --Afebrile, vital signs stable, no leukocytosis.  Change to oral antibiotics, complete total 7 days, discussed with pharmacy.  Acute cholecystitis, cholelithiasis, choledocholithiasis.  Status post laparoscopic cholecystectomy 2/17.  Status post ERCP 2/18. --LFTs now near normal.  Cleared for discharge per general surgery.  No need for antibiotics from surgical perspective.  Diabetes mellitus type 2 --CBG stable.  Resume metformin on discharge  Essential hypertension --Remains stable.  Continue telmisartan  Consultants   General surgery  GI  Procedures   2/17 laparoscopic cholecystectomy  2/18 ERCP Impression: - The major papilla appeared normal. - Choledocholithiasis was found. Complete removal  was accomplished by biliary sphincterotomy and  balloon extraction. - A biliary sphincterotomy was performed x2 as  above. - The biliary tree was swept multiple times. There  was no pancreatic duct wire advancement or  injection throughout the procedure Moderate Sedation: Not Applicable - Patient had care per Anesthesia.  Antibiotics   Ceftriaxone  Ceftin   Today's assessment: S: Feels well, tolerating diet, some soreness but less pain compared to yesterday. O: Vitals:  Vitals:   07/05/18 2130 07/06/18 0500  BP: 136/80 (!) 144/84  Pulse: 91 84  Resp: 20 20  Temp: 98.6 F (37 C) 98.6 F (37 C)  SpO2: 97% 92%    Constitutional:  . Appears calm and comfortable Respiratory:  . CTA bilaterally, no w/r/r.  . Respiratory effort normal.  Cardiovascular:  . RRR, no m/r/g . Mild bilateral hand edema and ankle edema. . Perfusion of the hands appears normal with normal radial pulses bilaterally. Psychiatric:  . judgement and insight  appear normal . Mental status o Mood, affect appropriate  Today's Data   Basic metabolic panel unremarkable.  AST and total bilirubin have normalized.  ALT trending down, 69.  Hemoglobin stable 12.0.  WBC within normal limits 8.9.  Platelets within normal limits.  Lab Data   Reviewed  Micro Data   2/16 urine culture greater than 100,000 colonies E. coli sensitive to cephalosporins  2/16 blood cultures 1/2+ for Escherichia coli, pansensitive  Imaging   Ultrasound ultrasound, limited showed distended gallbladder, biliary sludge, early changes of acute cholecystitis not excluded   Discharge Instructions  Discharge Instructions    Discharge instructions   Complete by:  As directed    Call your physician or seek immediate medical attention for fever, increased pain, drainage from incisions, vomiting, skin redness or worsening of condition. Low fat diet as per surgery.   Discharge wound care:   Complete by:  As directed    As per surgery recommendations   Increase activity slowly   Complete by:  As directed      Allergies as of 07/06/2018      Reactions   Erythromycin Other (See Comments)   Severe Abdominal Pain   Penicillins Hives   Has patient had a PCN reaction causing immediate rash, facial/tongue/throat swelling, SOB or lightheadedness with hypotension:  Yes  Has patient had a PCN reaction causing severe rash involving mucus membranes or skin necrosis:  No Has patient had a PCN reaction that required hospitalization: No Has patient had a PCN reaction occurring within the last 10 years: No If all of the above answers are "NO", then may proceed with Cephalosporin use. Tolerated CTX 11/23 admit      Medication List    STOP taking these medications   benzonatate 100 MG capsule Commonly known as:  TESSALON   doxycycline 100 MG capsule Commonly known as:  VIBRAMYCIN     TAKE these medications   acetaminophen 325 MG tablet Commonly known as:  TYLENOL Take 2  tablets (650 mg total) by mouth every 6 (six) hours as needed for mild pain.   amLODipine 10 MG tablet Commonly known as:  NORVASC Take 10 mg by mouth daily.   cefdinir 300 MG capsule Commonly known as:  OMNICEF Take 1 capsule (300 mg total) by mouth every 12 (twelve) hours. Start taking on:  July 07, 2018   colchicine 0.6 MG tablet Take 0.6 mg by mouth daily as needed (gout).   ibuprofen 600 MG tablet Commonly known as:  ADVIL,MOTRIN Take 1 tablet (600 mg total) by mouth every 6 (six) hours as needed for moderate pain.   metFORMIN 750 MG 24 hr tablet Commonly known as:  GLUCOPHAGE-XR Take 750 mg by mouth daily after breakfast.   oxyCODONE 5 MG immediate release tablet Commonly known as:  Oxy IR/ROXICODONE Take 1-2 tablets (5-10 mg total) by mouth every 6 (six) hours as needed for severe pain.   telmisartan 80 MG tablet Commonly known as:  MICARDIS Take 80 mg by mouth daily.            Discharge Care Instructions  (From admission, onward)         Start     Ordered   07/06/18 0000  Discharge wound care:    Comments:  As per surgery recommendations   07/06/18 1043         Allergies  Allergen Reactions  . Erythromycin Other (See Comments)    Severe Abdominal Pain  . Penicillins Hives    Has patient had a PCN reaction causing immediate rash, facial/tongue/throat swelling, SOB or lightheadedness with hypotension:  Yes  Has patient had a PCN reaction causing severe rash involving mucus  membranes or skin necrosis:  No Has patient had a PCN reaction that required hospitalization: No Has patient had a PCN reaction occurring within the last 10 years: No If all of the above answers are "NO", then may proceed with Cephalosporin use. Tolerated CTX 11/23 admit     The results of significant diagnostics from this hospitalization (including imaging, microbiology, ancillary and laboratory) are listed below for reference.    Significant Diagnostic Studies: Dg  Cholangiogram Operative  Result Date: 07/04/2018 CLINICAL DATA:  Cholecystectomy EXAM: INTRAOPERATIVE CHOLANGIOGRAM TECHNIQUE: Cholangiographic images from the C-arm fluoroscopic device were submitted for interpretation post-operatively. Please see the procedural report for the amount of contrast and the fluoroscopy time utilized. COMPARISON:  Ultrasound from earlier the same day FINDINGS: At least 3 large filling defects in the distal CBD which appear occlusive. The proximal CBD and central intrahepatic ducts appear mildly distended. The intrahepatic biliary tree is incompletely opacified. No contrast is seen to flow beyond the ampullary into the duodenum. : 1. Obstructing distal choledocholithiasis. Electronically Signed   By: Corlis Leak M.D.   On: 07/04/2018 11:17   Dg Ercp Biliary & Pancreatic Ducts  Result Date: 07/05/2018 CLINICAL DATA:  ERCP with sphincterotomy and balloon sweeping. EXAM: ERCP TECHNIQUE: Multiple spot images obtained with the fluoroscopic device and submitted for interpretation post-procedure. FLUOROSCOPY TIME:  5 minutes, 54 seconds COMPARISON:  Intraoperative cholangiogram during laparoscopic cholecystectomy-07/04/2018 FINDINGS: Eleven spot fluoroscopic images of the right upper abdominal quadrant during ERCP are provided for review Initial image demonstrates an ERCP probe overlying the right upper abdominal quadrant. Cholecystectomy clips overlies expected location of the gallbladder fossa. There is selective cannulation and opacification of the common bile duct which appears mildly dilated. Subsequent images demonstrate insufflation of a balloon within the central aspect of the CBD with subsequent presumed biliary sweeping and sphincterotomy. IMPRESSION: ERCP with biliary sweeping and presumed sphincterotomy as above. These images were submitted for radiologic interpretation only. Please see the procedural report for the amount of contrast and the fluoroscopy time utilized.  Electronically Signed   By: Simonne Come M.D.   On: 07/05/2018 14:36   US Abdomen Limited Ruq  Result Date: 07/04/2018 CLINICAL DATA:  Elevated liver function tests. EXAM: ULTRASOUND ABDOMEN LIMITED RIGHT UPPER QUADRANT COMPARISON:  CT 10/04/2005 FINDINGS: Gallbladder: The gallbladder is somewhat distended in appearance without mural thickening by trace pericholecystic fluid. Biliary sludge and a 15 mm calculus are identified within the gallbladder. No biliary dilatation is identified. No sonographic Murphy's was elicited by the technologist. Common bile duct: Diameter: Top normal for age at 7 mm Liver: No focal lesion identified. Within normal limits in parenchymal echogenicity. Portal vein is patent on color Doppler imaging with normal direction of blood flow towards the liver. IMPRESSION: Distended gallbladder with trace pericholecystic fluid, biliary sludge and calculi. Early changes of acute cholecystitis is not excluded. Electronically Signed   By: Tollie Eth M.D.   On: 07/04/2018 03:11    Microbiology: Recent Results (from the past 240 hour(s))  Urine culture     Status: Abnormal   Collection Time: 07/03/18 11:47 PM  Result Value Ref Range Status   Specimen Description   Final    URINE, RANDOM Performed at [facility], [address] [place], [place] [address]    Special Requests   Final    NONE Performed at [facility], [address], [place], [place] [address]    Culture >=100,000 COLONIES/mL ESCHERICHIA COLI (A)  Final   Report  Status 07/06/2018 FINAL  Final   Organism ID, Bacteria ESCHERICHIA COLI (A)  Final      Susceptibility   Escherichia coli - MIC*    AMPICILLIN 16 INTERMEDIATE Intermediate     CEFAZOLIN <=4 SENSITIVE Sensitive     CEFTRIAXONE <=1 SENSITIVE Sensitive     CIPROFLOXACIN >=4 RESISTANT Resistant     GENTAMICIN <=1 SENSITIVE Sensitive     IMIPENEM <=0.25 SENSITIVE Sensitive     NITROFURANTOIN <=16 SENSITIVE Sensitive      TRIMETH/SULFA <=20 SENSITIVE Sensitive     AMPICILLIN/SULBACTAM 8 SENSITIVE Sensitive     PIP/TAZO 8 SENSITIVE Sensitive     Extended ESBL NEGATIVE Sensitive     * >=100,000 COLONIES/mL ESCHERICHIA COLI  Blood culture (routine x 2)     Status: Abnormal   Collection Time: 07/03/18 11:47 PM  Result Value Ref Range Status   Specimen Description   Final    BLOOD RIGHT ANTECUBITAL Performed at [facility], [address] [place], [place] [address]    Special Requests   Final    BOTTLES DRAWN AEROBIC AND ANAEROBIC Blood Culture adequate volume Performed at [facility], [address] [place], [place] [address]    Culture  Setup Time   Final    GRAM NEGATIVE RODS IN BOTH AEROBIC AND ANAEROBIC BOTTLES CRITICAL RESULT CALLED TO, READ BACK BY AND VERIFIED WITH: D. Wofford PharmD 16:10 07/04/18 ([email or web address]) Performed at [facility], [address] [place], [place] [address]    Culture ESCHERICHIA COLI (A)  Final   Report Status 07/06/2018 FINAL  Final   Organism ID, Bacteria ESCHERICHIA COLI  Final      Susceptibility   Escherichia coli - MIC*    AMPICILLIN <=2 SENSITIVE Sensitive     CEFAZOLIN <=4 SENSITIVE Sensitive     CEFEPIME <=1 SENSITIVE Sensitive     CEFTAZIDIME <=1 SENSITIVE Sensitive     CEFTRIAXONE <=1 SENSITIVE Sensitive     CIPROFLOXACIN <=0.25 SENSITIVE Sensitive     GENTAMICIN <=1 SENSITIVE Sensitive     IMIPENEM <=0.25 SENSITIVE Sensitive     TRIMETH/SULFA <=20 SENSITIVE Sensitive     AMPICILLIN/SULBACTAM <=2 SENSITIVE Sensitive     PIP/TAZO <=4 SENSITIVE Sensitive     Extended ESBL NEGATIVE Sensitive     * ESCHERICHIA COLI  Blood culture (routine x 2)     Status: None (Preliminary result)   Collection Time: 07/03/18 11:47 PM  Result Value Ref Range Status   Specimen Description   Final    BLOOD LEFT ANTECUBITAL Performed at [facility], [address] [place], [place] [address]    Special  Requests   Final    BOTTLES DRAWN AEROBIC AND ANAEROBIC Blood Culture results may not be optimal due to an excessive volume of blood received in culture bottles Performed at [facility], [address] [place], [place] [address]    Culture   Final    NO GROWTH 2 DAYS Performed at [facility], [address] [place], [place] [address]    Report Status PENDING  Incomplete  Blood Culture ID Panel (Reflexed)     Status: Abnormal   Collection Time: 07/03/18 11:47 PM  Result Value Ref Range Status   Enterococcus species NOT DETECTED NOT DETECTED Final   Listeria monocytogenes NOT DETECTED NOT DETECTED Final   Staphylococcus species NOT DETECTED NOT DETECTED Final   Staphylococcus aureus (BCID) NOT DETECTED NOT DETECTED Final   Streptococcus  species NOT DETECTED NOT DETECTED Final   Streptococcus agalactiae NOT DETECTED NOT DETECTED Final   Streptococcus pneumoniae NOT DETECTED NOT DETECTED Final   Streptococcus pyogenes NOT DETECTED NOT DETECTED Final   Acinetobacter baumannii NOT DETECTED NOT DETECTED Final   Enterobacteriaceae species DETECTED (A) NOT DETECTED Final    Comment: Enterobacteriaceae represent a large family of gram-negative bacteria, not a single organism. CRITICAL RESULT CALLED TO, READ BACK BY AND VERIFIED WITH: D. Wofford PharmD 16:10 07/04/18 ([email or web address])    Enterobacter cloacae complex NOT DETECTED NOT DETECTED Final   Escherichia coli DETECTED (A) NOT DETECTED Final    Comment: CRITICAL RESULT CALLED TO, READ BACK BY AND VERIFIED WITH: D. Wofford PharmD 16:10 07/04/18 ([email or web address])    Klebsiella oxytoca NOT DETECTED NOT DETECTED Final   Klebsiella pneumoniae NOT DETECTED NOT DETECTED Final   Proteus species NOT DETECTED NOT DETECTED Final   Serratia marcescens NOT DETECTED NOT DETECTED Final   Carbapenem resistance NOT DETECTED NOT DETECTED Final   Haemophilus influenzae NOT DETECTED NOT DETECTED Final   Neisseria meningitidis NOT DETECTED NOT  DETECTED Final   Pseudomonas aeruginosa NOT DETECTED NOT DETECTED Final   Candida albicans NOT DETECTED NOT DETECTED Final   Candida glabrata NOT DETECTED NOT DETECTED Final   Candida krusei NOT DETECTED NOT DETECTED Final   Candida parapsilosis NOT DETECTED NOT DETECTED Final   Candida tropicalis NOT DETECTED NOT DETECTED Final    Comment: Performed at [facility], [address], [place], [place] [address]  MRSA PCR Screening     Status: None   Collection Time: 07/04/18  9:00 AM  Result Value Ref Range Status   MRSA by PCR NEGATIVE NEGATIVE Final    Comment:        The GeneXpert MRSA Assay (FDA approved for NASAL specimens only), is one component of a comprehensive MRSA colonization surveillance program. It is not intended to diagnose MRSA infection nor to guide or monitor treatment for MRSA infections. Performed at [facility], [address], [place], [place] [address]      Labs: Basic Metabolic Panel: Recent Labs  Lab 07/03/18 2347 07/04/18 0442 07/05/18 0531 07/06/18 0548  NA 136 138 135 136  K 3.5 4.1 3.7 4.0  CL 106 106 106 107  CO2 22 23 21* 23  GLUCOSE 155* 128* 187* 125*  BUN 14 13 16 16   CREATININE 1.05 1.08 0.93 0.85  CALCIUM 8.6* 8.1* 7.9* 8.0*   Liver Function Tests: Recent Labs  Lab 07/03/18 2347 07/04/18 0714 07/05/18 0531 07/06/18 0548  AST 124* 90* 59* 35  ALT 127* 106* 93* 69*  ALKPHOS 108 87 74 63  BILITOT 2.0* 2.6* 1.1 1.1  PROT 6.6 5.6* 5.2* 5.4*  ALBUMIN 3.8 3.1* 2.6* 2.6*   Recent Labs  Lab 07/06/18 0548  LIPASE 22   CBC: Recent Labs  Lab 07/03/18 2347 07/04/18 0442 07/05/18 0531 07/06/18 0548  WBC 11.8* 12.9* 8.4 8.7  NEUTROABS 10.7*  --   --   --   HGB 13.2 12.8* 12.1* 12.0*  HCT 41.6 41.0 38.8* 38.5*  MCV 101.0* 102.2* 102.1* 101.0*  PLT 237 247 215 207    CBG: Recent Labs  Lab 07/05/18 1626 07/05/18 2124 07/06/18 0049 07/06/18 0457 07/06/18 0731  GLUCAP 171* 219* 173* 113* 104*     Principal Problem:   Acute cholecystitis Active Problems:   Sepsis (HCC)   Type 2 diabetes mellitus without complication (HCC)   Essential hypertension   Time coordinating  discharge: 40 minutes  Signed:  Brendia Sacks, MD  [facility]  07/06/2018, 6:51 PM

## 2018-07-07 ENCOUNTER — Encounter (HOSPITAL_COMMUNITY): Payer: Self-pay | Admitting: Gastroenterology

## 2018-07-09 LAB — CULTURE, BLOOD (ROUTINE X 2): Culture: NO GROWTH

## 2018-07-13 DIAGNOSIS — I10 Essential (primary) hypertension: Secondary | ICD-10-CM | POA: Diagnosis not present

## 2018-07-13 DIAGNOSIS — M545 Low back pain: Secondary | ICD-10-CM | POA: Diagnosis not present

## 2018-07-13 DIAGNOSIS — Z9049 Acquired absence of other specified parts of digestive tract: Secondary | ICD-10-CM | POA: Diagnosis not present

## 2018-07-21 DIAGNOSIS — M546 Pain in thoracic spine: Secondary | ICD-10-CM | POA: Diagnosis not present

## 2018-08-02 DIAGNOSIS — M545 Low back pain: Secondary | ICD-10-CM | POA: Diagnosis not present

## 2018-11-10 DIAGNOSIS — E876 Hypokalemia: Secondary | ICD-10-CM | POA: Diagnosis not present

## 2018-11-10 DIAGNOSIS — E119 Type 2 diabetes mellitus without complications: Secondary | ICD-10-CM | POA: Diagnosis not present

## 2018-11-10 DIAGNOSIS — Z125 Encounter for screening for malignant neoplasm of prostate: Secondary | ICD-10-CM | POA: Diagnosis not present

## 2018-11-10 DIAGNOSIS — I10 Essential (primary) hypertension: Secondary | ICD-10-CM | POA: Diagnosis not present

## 2018-11-10 DIAGNOSIS — N390 Urinary tract infection, site not specified: Secondary | ICD-10-CM | POA: Diagnosis not present

## 2018-11-10 DIAGNOSIS — M2550 Pain in unspecified joint: Secondary | ICD-10-CM | POA: Diagnosis not present

## 2018-11-10 DIAGNOSIS — E1140 Type 2 diabetes mellitus with diabetic neuropathy, unspecified: Secondary | ICD-10-CM | POA: Diagnosis not present

## 2018-11-16 DIAGNOSIS — E1140 Type 2 diabetes mellitus with diabetic neuropathy, unspecified: Secondary | ICD-10-CM | POA: Diagnosis not present

## 2018-11-16 DIAGNOSIS — M2550 Pain in unspecified joint: Secondary | ICD-10-CM | POA: Diagnosis not present

## 2018-11-16 DIAGNOSIS — I10 Essential (primary) hypertension: Secondary | ICD-10-CM | POA: Diagnosis not present

## 2018-11-16 DIAGNOSIS — I38 Endocarditis, valve unspecified: Secondary | ICD-10-CM | POA: Diagnosis not present

## 2018-11-16 DIAGNOSIS — R8271 Bacteriuria: Secondary | ICD-10-CM | POA: Diagnosis not present

## 2018-11-16 DIAGNOSIS — I493 Ventricular premature depolarization: Secondary | ICD-10-CM | POA: Diagnosis not present

## 2018-11-16 DIAGNOSIS — Z0000 Encounter for general adult medical examination without abnormal findings: Secondary | ICD-10-CM | POA: Diagnosis not present

## 2018-11-16 DIAGNOSIS — R600 Localized edema: Secondary | ICD-10-CM | POA: Diagnosis not present

## 2018-11-16 DIAGNOSIS — B9620 Unspecified Escherichia coli [E. coli] as the cause of diseases classified elsewhere: Secondary | ICD-10-CM | POA: Diagnosis not present

## 2018-11-16 DIAGNOSIS — M1A09X1 Idiopathic chronic gout, multiple sites, with tophus (tophi): Secondary | ICD-10-CM | POA: Diagnosis not present

## 2018-11-16 DIAGNOSIS — M546 Pain in thoracic spine: Secondary | ICD-10-CM | POA: Diagnosis not present

## 2018-11-16 DIAGNOSIS — I440 Atrioventricular block, first degree: Secondary | ICD-10-CM | POA: Diagnosis not present

## 2018-12-01 DIAGNOSIS — I38 Endocarditis, valve unspecified: Secondary | ICD-10-CM | POA: Diagnosis not present

## 2018-12-01 DIAGNOSIS — I10 Essential (primary) hypertension: Secondary | ICD-10-CM | POA: Diagnosis not present

## 2018-12-15 ENCOUNTER — Other Ambulatory Visit: Payer: Self-pay

## 2018-12-15 DIAGNOSIS — R600 Localized edema: Secondary | ICD-10-CM | POA: Diagnosis not present

## 2018-12-15 DIAGNOSIS — H938X1 Other specified disorders of right ear: Secondary | ICD-10-CM | POA: Diagnosis not present

## 2018-12-15 DIAGNOSIS — R195 Other fecal abnormalities: Secondary | ICD-10-CM | POA: Diagnosis not present

## 2018-12-15 DIAGNOSIS — N390 Urinary tract infection, site not specified: Secondary | ICD-10-CM | POA: Diagnosis not present

## 2019-01-12 DIAGNOSIS — R8271 Bacteriuria: Secondary | ICD-10-CM | POA: Diagnosis not present

## 2019-02-14 DIAGNOSIS — H90A31 Mixed conductive and sensorineural hearing loss, unilateral, right ear with restricted hearing on the contralateral side: Secondary | ICD-10-CM | POA: Diagnosis not present

## 2019-02-14 DIAGNOSIS — H90A22 Sensorineural hearing loss, unilateral, left ear, with restricted hearing on the contralateral side: Secondary | ICD-10-CM | POA: Diagnosis not present

## 2019-02-14 DIAGNOSIS — E119 Type 2 diabetes mellitus without complications: Secondary | ICD-10-CM | POA: Diagnosis not present

## 2019-02-14 DIAGNOSIS — Z822 Family history of deafness and hearing loss: Secondary | ICD-10-CM | POA: Diagnosis not present

## 2019-06-27 DIAGNOSIS — Z1211 Encounter for screening for malignant neoplasm of colon: Secondary | ICD-10-CM | POA: Diagnosis not present

## 2019-06-27 DIAGNOSIS — Z1212 Encounter for screening for malignant neoplasm of rectum: Secondary | ICD-10-CM | POA: Diagnosis not present

## 2019-11-27 DIAGNOSIS — M1A09X1 Idiopathic chronic gout, multiple sites, with tophus (tophi): Secondary | ICD-10-CM | POA: Diagnosis not present

## 2019-11-27 DIAGNOSIS — I493 Ventricular premature depolarization: Secondary | ICD-10-CM | POA: Diagnosis not present

## 2019-11-27 DIAGNOSIS — Z0001 Encounter for general adult medical examination with abnormal findings: Secondary | ICD-10-CM | POA: Diagnosis not present

## 2019-11-27 DIAGNOSIS — I10 Essential (primary) hypertension: Secondary | ICD-10-CM | POA: Diagnosis not present

## 2019-11-27 DIAGNOSIS — R8271 Bacteriuria: Secondary | ICD-10-CM | POA: Diagnosis not present

## 2019-11-27 DIAGNOSIS — M2550 Pain in unspecified joint: Secondary | ICD-10-CM | POA: Diagnosis not present

## 2019-11-27 DIAGNOSIS — I38 Endocarditis, valve unspecified: Secondary | ICD-10-CM | POA: Diagnosis not present

## 2019-11-27 DIAGNOSIS — R600 Localized edema: Secondary | ICD-10-CM | POA: Diagnosis not present

## 2019-11-27 DIAGNOSIS — L602 Onychogryphosis: Secondary | ICD-10-CM | POA: Diagnosis not present

## 2019-11-27 DIAGNOSIS — E1140 Type 2 diabetes mellitus with diabetic neuropathy, unspecified: Secondary | ICD-10-CM | POA: Diagnosis not present

## 2019-11-27 DIAGNOSIS — B351 Tinea unguium: Secondary | ICD-10-CM | POA: Diagnosis not present

## 2019-12-21 DIAGNOSIS — I10 Essential (primary) hypertension: Secondary | ICD-10-CM | POA: Diagnosis not present

## 2019-12-21 DIAGNOSIS — R600 Localized edema: Secondary | ICD-10-CM | POA: Diagnosis not present

## 2019-12-21 DIAGNOSIS — E1140 Type 2 diabetes mellitus with diabetic neuropathy, unspecified: Secondary | ICD-10-CM | POA: Diagnosis not present

## 2019-12-28 DIAGNOSIS — I10 Essential (primary) hypertension: Secondary | ICD-10-CM | POA: Diagnosis not present

## 2020-01-30 DIAGNOSIS — I10 Essential (primary) hypertension: Secondary | ICD-10-CM | POA: Diagnosis not present

## 2020-01-30 DIAGNOSIS — E1140 Type 2 diabetes mellitus with diabetic neuropathy, unspecified: Secondary | ICD-10-CM | POA: Diagnosis not present

## 2020-04-23 DIAGNOSIS — Z23 Encounter for immunization: Secondary | ICD-10-CM | POA: Diagnosis not present

## 2020-04-28 DIAGNOSIS — Z20822 Contact with and (suspected) exposure to covid-19: Secondary | ICD-10-CM | POA: Diagnosis not present

## 2020-04-30 DIAGNOSIS — I10 Essential (primary) hypertension: Secondary | ICD-10-CM | POA: Diagnosis not present

## 2020-04-30 DIAGNOSIS — E1140 Type 2 diabetes mellitus with diabetic neuropathy, unspecified: Secondary | ICD-10-CM | POA: Diagnosis not present

## 2020-05-30 DIAGNOSIS — E1140 Type 2 diabetes mellitus with diabetic neuropathy, unspecified: Secondary | ICD-10-CM | POA: Diagnosis not present

## 2020-05-30 DIAGNOSIS — I10 Essential (primary) hypertension: Secondary | ICD-10-CM | POA: Diagnosis not present

## 2020-06-20 DIAGNOSIS — I7300 Raynaud's syndrome without gangrene: Secondary | ICD-10-CM | POA: Diagnosis not present

## 2020-06-20 DIAGNOSIS — I10 Essential (primary) hypertension: Secondary | ICD-10-CM | POA: Diagnosis not present

## 2020-06-20 DIAGNOSIS — E1140 Type 2 diabetes mellitus with diabetic neuropathy, unspecified: Secondary | ICD-10-CM | POA: Diagnosis not present

## 2020-07-09 DIAGNOSIS — E119 Type 2 diabetes mellitus without complications: Secondary | ICD-10-CM | POA: Diagnosis not present

## 2020-07-09 DIAGNOSIS — I10 Essential (primary) hypertension: Secondary | ICD-10-CM | POA: Diagnosis not present

## 2020-10-04 DIAGNOSIS — E119 Type 2 diabetes mellitus without complications: Secondary | ICD-10-CM | POA: Diagnosis not present

## 2020-10-04 DIAGNOSIS — H40053 Ocular hypertension, bilateral: Secondary | ICD-10-CM | POA: Diagnosis not present

## 2020-11-29 DIAGNOSIS — I10 Essential (primary) hypertension: Secondary | ICD-10-CM | POA: Diagnosis not present

## 2020-11-29 DIAGNOSIS — E119 Type 2 diabetes mellitus without complications: Secondary | ICD-10-CM | POA: Diagnosis not present

## 2020-12-04 DIAGNOSIS — R8271 Bacteriuria: Secondary | ICD-10-CM | POA: Diagnosis not present

## 2020-12-04 DIAGNOSIS — S51812A Laceration without foreign body of left forearm, initial encounter: Secondary | ICD-10-CM | POA: Diagnosis not present

## 2020-12-04 DIAGNOSIS — Z0000 Encounter for general adult medical examination without abnormal findings: Secondary | ICD-10-CM | POA: Diagnosis not present

## 2020-12-04 DIAGNOSIS — I7300 Raynaud's syndrome without gangrene: Secondary | ICD-10-CM | POA: Diagnosis not present

## 2020-12-04 DIAGNOSIS — I10 Essential (primary) hypertension: Secondary | ICD-10-CM | POA: Diagnosis not present

## 2020-12-04 DIAGNOSIS — R195 Other fecal abnormalities: Secondary | ICD-10-CM | POA: Diagnosis not present

## 2020-12-04 DIAGNOSIS — E1140 Type 2 diabetes mellitus with diabetic neuropathy, unspecified: Secondary | ICD-10-CM | POA: Diagnosis not present

## 2020-12-04 DIAGNOSIS — M1A09X1 Idiopathic chronic gout, multiple sites, with tophus (tophi): Secondary | ICD-10-CM | POA: Diagnosis not present

## 2020-12-06 ENCOUNTER — Other Ambulatory Visit: Payer: Self-pay | Admitting: Internal Medicine

## 2020-12-06 DIAGNOSIS — E1140 Type 2 diabetes mellitus with diabetic neuropathy, unspecified: Secondary | ICD-10-CM

## 2020-12-27 ENCOUNTER — Other Ambulatory Visit: Payer: Medicare Other

## 2021-01-20 DIAGNOSIS — Z20822 Contact with and (suspected) exposure to covid-19: Secondary | ICD-10-CM | POA: Diagnosis not present

## 2021-02-11 ENCOUNTER — Ambulatory Visit
Admission: RE | Admit: 2021-02-11 | Discharge: 2021-02-11 | Disposition: A | Discharge status: Home / Self Care | Payer: No Typology Code available for payment source | Source: Ambulatory Visit | Attending: Internal Medicine | Admitting: Internal Medicine

## 2021-02-11 DIAGNOSIS — E1140 Type 2 diabetes mellitus with diabetic neuropathy, unspecified: Secondary | ICD-10-CM

## 2021-02-19 DIAGNOSIS — I2584 Coronary atherosclerosis due to calcified coronary lesion: Secondary | ICD-10-CM | POA: Diagnosis not present

## 2021-02-19 DIAGNOSIS — I2510 Atherosclerotic heart disease of native coronary artery without angina pectoris: Secondary | ICD-10-CM | POA: Diagnosis not present

## 2021-02-19 DIAGNOSIS — J8410 Pulmonary fibrosis, unspecified: Secondary | ICD-10-CM | POA: Diagnosis not present

## 2021-03-18 DIAGNOSIS — Z23 Encounter for immunization: Secondary | ICD-10-CM | POA: Diagnosis not present

## 2021-03-25 DIAGNOSIS — Z23 Encounter for immunization: Secondary | ICD-10-CM | POA: Diagnosis not present

## 2021-04-14 ENCOUNTER — Encounter: Payer: Self-pay | Admitting: *Deleted

## 2021-04-14 ENCOUNTER — Other Ambulatory Visit: Payer: Self-pay

## 2021-04-14 ENCOUNTER — Encounter: Payer: Self-pay | Admitting: Cardiology

## 2021-04-14 ENCOUNTER — Ambulatory Visit (INDEPENDENT_AMBULATORY_CARE_PROVIDER_SITE_OTHER): Payer: Medicare Other | Admitting: Cardiology

## 2021-04-14 VITALS — BP 148/82 | HR 74 | Ht 70.5 in | Wt 166.6 lb

## 2021-04-14 DIAGNOSIS — Z79899 Other long term (current) drug therapy: Secondary | ICD-10-CM | POA: Diagnosis not present

## 2021-04-14 DIAGNOSIS — R931 Abnormal findings on diagnostic imaging of heart and coronary circulation: Secondary | ICD-10-CM

## 2021-04-14 DIAGNOSIS — I10 Essential (primary) hypertension: Secondary | ICD-10-CM

## 2021-04-14 DIAGNOSIS — I2510 Atherosclerotic heart disease of native coronary artery without angina pectoris: Secondary | ICD-10-CM | POA: Insufficient documentation

## 2021-04-14 DIAGNOSIS — I25119 Atherosclerotic heart disease of native coronary artery with unspecified angina pectoris: Secondary | ICD-10-CM | POA: Insufficient documentation

## 2021-04-14 DIAGNOSIS — R600 Localized edema: Secondary | ICD-10-CM | POA: Insufficient documentation

## 2021-04-14 DIAGNOSIS — R011 Cardiac murmur, unspecified: Secondary | ICD-10-CM | POA: Insufficient documentation

## 2021-04-14 DIAGNOSIS — R9431 Abnormal electrocardiogram [ECG] [EKG]: Secondary | ICD-10-CM | POA: Insufficient documentation

## 2021-04-14 DIAGNOSIS — R0602 Shortness of breath: Secondary | ICD-10-CM

## 2021-04-14 MED ORDER — ROSUVASTATIN CALCIUM 10 MG PO TABS: 10.0000 mg | ORAL_TABLET | Freq: Every day | ORAL | 3 refills | Status: DC

## 2021-04-14 MED ORDER — ASPIRIN EC 81 MG PO TBEC: 81.0000 mg | DELAYED_RELEASE_TABLET | Freq: Every day | ORAL | 3 refills | Status: DC

## 2021-04-14 NOTE — Assessment & Plan Note (Signed)
Aortic stenosis type murmur noted.  Many years ago he had an echocardiogram performed.  I do not see the results of this.

## 2021-04-14 NOTE — Patient Instructions (Addendum)
Medication Instructions:  Please start Crestor 10 mg a day and Aspirin 81 mg a day. Continue all other medications as listed.  *If you need a refill on your cardiac medications before your next appointment, please call your pharmacy*  Lab Work: Please have blood work in 3 months (Lipid/ALT)  If you have labs (blood work) drawn today and your tests are completely normal, you will receive your results only by: MyChart Message (if you have MyChart) OR A paper copy in the mail If you have any lab test that is abnormal or we need to change your treatment, we will call you to review the results.  Testing/Procedures: Your physician has requested that you have a lexiscan myoview. For further information please visit [email or web address] Please follow instruction sheet, as given.  Your physician has requested that you have an echocardiogram. Echocardiography is a painless test that uses sound waves to create images of your heart. It provides your doctor with information about the size and shape of your heart and how well your heart's chambers and valves are working. This procedure takes approximately one hour. There are no restrictions for this procedure.  Follow-Up: At [facility], you and your health needs are our priority.  As part of our continuing mission to provide you with exceptional heart care, we have created designated Provider Care Teams.  These Care Teams include your primary Cardiologist (physician) and Advanced Practice Providers (APPs -  Physician Assistants and Nurse Practitioners) who all work together to provide you with the care you need, when you need it.  We recommend signing up for the patient portal called "MyChart".  Sign up information is provided on this After Visit Summary.  MyChart is used to connect with patients for Virtual Visits (Telemedicine).  Patients are able to view lab/test results, encounter notes, upcoming appointments, etc.  Non-urgent messages can be sent to  your provider as well.   To learn more about what you can do with MyChart, go to [email or web address].    Your next appointment:   6 month(s)  The format for your next appointment:   In Person  Provider:   Dr Alcide Evener If MD is not listed, click here to update    :1}    Thank you for choosing [facility]!!

## 2021-04-14 NOTE — Assessment & Plan Note (Signed)
Left axis deviation noted sinus rhythm 74.

## 2021-04-14 NOTE — Progress Notes (Signed)
Cardiology Office Note:    Date:  04/14/2021   ID:  [name] Steele, DOB May 17, 1941, MRN [id number]  PCP:  [name], MD   [facility] Providers Cardiologist:  None     Referring MD: [name], MD   History of Present Illness:    [name] Steele is a 80 y.o. male here for the evaluation of coronary artery disease at the request of Dr. Renne Steele.  02/28/2021 Notes from Dr. Renne Steele reviewed: Total agatson score was 6,230, 97th percentile noted on calcium score 01/2021, so he was referred to cardiology.  Today: He is accompanied by a family member. Overall, he states that he "could be worse."  He continues to suffer from bothersome bilateral LE edema.  For activity, he often works outside and has been raking leaves lately. He denies having any symptoms. Since his early retirement, he has been struggling with gradually decreasing energy levels and increasing physical limitation due to his health issues.  He started 81 mg aspirin after his last PCP visit.  Former smoker, he quit smoking in 1990.  No known family history of heart attacks.  He denies any palpitations, chest pain, or shortness of breath. No lightheadedness, headaches, syncope, orthopnea, or PND.   Past Medical History:  Diagnosis Date   Arthritis    Chronic urinary tract infection    Diabetes mellitus without complication (HCC)    Gout    Hypertension    Osteoporosis     Past Surgical History:  Procedure Laterality Date   CHOLECYSTECTOMY N/A 07/04/2018   Procedure: LAPAROSCOPIC CHOLECYSTECTOMY WITH INTRAOPERATIVE CHOLANGIOGRAM;  Surgeon: Abigail Miyamoto, MD;  Location: [facility];  Service: General;  Laterality: N/A;   ERCP N/A 07/05/2018   Procedure: ENDOSCOPIC RETROGRADE CHOLANGIOPANCREATOGRAPHY (ERCP);  Surgeon: Vida Rigger, MD;  Location: [facility];  Service: Endoscopy;  Laterality: N/A;   JOINT REPLACEMENT Bilateral    REMOVAL OF STONES  07/05/2018   Procedure: REMOVAL OF STONES;  Surgeon: Vida Rigger, MD;  Location: [facility];  Service: Endoscopy;;   SPHINCTEROTOMY  07/05/2018   Procedure: Dennison Mascot;  Surgeon: Vida Rigger, MD;  Location: [facility];  Service: Endoscopy;;    Current Medications: Current Meds  Medication Sig   amLODipine (NORVASC) 10 MG tablet Take 10 mg by mouth daily.   aspirin EC 81 MG tablet Take 1 tablet (81 mg total) by mouth daily. Swallow whole.   colchicine 0.6 MG tablet Take 0.6 mg by mouth daily as needed (gout).    ibuprofen (ADVIL,MOTRIN) 600 MG tablet Take 1 tablet (600 mg total) by mouth every 6 (six) hours as needed for moderate pain.   metFORMIN (GLUCOPHAGE-XR) 750 MG 24 hr tablet Take 750 mg by mouth daily after breakfast.   rosuvastatin (CRESTOR) 10 MG tablet Take 1 tablet (10 mg total) by mouth daily.   telmisartan (MICARDIS) 80 MG tablet Take 80 mg by mouth daily.     Allergies:   Erythromycin and Penicillins   Social History   Socioeconomic History   Marital status: Married    Spouse name: Not on file   Number of children: Not on file   Years of education: Not on file   Highest education level: Not on file  Occupational History   Not on file  Tobacco Use   Smoking status: Former    Packs/day: 1.00    Years: 60.00    Pack years: 60.00    Types: Cigarettes    Quit date: 43    Years since quitting: 67.9  Smokeless tobacco: Never  Substance and Sexual Activity   Alcohol use: Yes    Alcohol/week: 0.0 standard drinks    Comment: social   Drug use: No   Sexual activity: Not on file  Other Topics Concern   Not on file  Social History Narrative   Not on file   Social Determinants of Health   Financial Resource Strain: Not on file  Food Insecurity: Not on file  Transportation Needs: Not on file  Physical Activity: Not on file  Stress: Not on file  Social Connections: Not on file     Family History: The patient's family history includes Heart disease in his sister.  ROS:   Please see the history of present  illness.    (+) Bilateral LE edema (+) Fatigue All other systems reviewed and are negative.  EKGs/Labs/Other Studies Reviewed:    The following studies were reviewed today:  CT/Calcium Score 02/11/2021: COMPARISON:  CT a of the chest on 12/15/2017   FINDINGS: CORONARY CALCIUM SCORES:   Left Main: 0   LAD: 4,143   LCx: 0   RCA: 2,087   Total Agatston Score: 6,230   The calcium score is nearly impossible to accurately quantify due to contiguous nature of calcium with adjacent calcified aortic plaque as well. Calcium detection software is unable to separate some of the calcifications and the LAD score also includes the left circumflex.   MESA database percentile: 97   AORTA MEASUREMENTS:   Ascending Aorta: 37 mm   Descending Aorta: 31 mm   OTHER FINDINGS:   The heart size is within normal limits. No pericardial fluid is identified. Visualized segments of the thoracic aorta and central pulmonary arteries are normal in caliber. Visualized mediastinum and hilar regions demonstrate no lymphadenopathy or masses. Progression subpleural interstitial thickening with potential early fibrotic disease at the lung bases. Visualized lungs show no evidence of pulmonary edema, consolidation, pneumothorax, nodule or pleural fluid. Visualized upper abdomen and bony structures are unremarkable.   IMPRESSION: 1. Extensive calcified plaque in the distribution of the coronary arteries. Exact quantification is not entirely accurate due to contiguous nature of plaque extending throughout the coronary artery tree which also included calculation of some adjacent plaque in the proximal thoracic aorta. Calculated score of 6,230 is at the 97th percentile for the patient's age, sex and race. 2. Evidence of chronic interstitial lung disease which shows progression since a prior CT in 2019 with evidence of possible early pulmonary fibrosis at the lung bases. Correlation suggested clinically  with any respiratory symptoms or prior pulmonary workup.  EKG:  EKG is personally reviewed and interpreted. 04/14/2021: Sinus rhythm. Rate 74 bpm. Left axis deviation.  Recent Labs: No results found for requested labs within last 8760 hours.   Recent Lipid Panel No results found for: CHOL, TRIG, HDL, CHOLHDL, VLDL, LDLCALC, LDLDIRECT   Risk Assessment/Calculations:          Physical Exam:    VS:  BP (!) 148/82   Pulse 74   Ht 5' 10.5" (1.791 m)   Wt 166 lb 9.6 oz (75.6 kg)   SpO2 97%   BMI 23.57 kg/m     Wt Readings from Last 3 Encounters:  04/14/21 166 lb 9.6 oz (75.6 kg)  07/06/18 184 lb 15.5 oz (83.9 kg)  12/15/17 167 lb 15.9 oz (76.2 kg)     GEN: Well nourished, well developed in no acute distress HEENT: Normal NECK: No JVD; No carotid bruits LYMPHATICS: No lymphadenopathy CARDIAC: RRR, 2/6  systolic murmur, no rubs, no gallops RESPIRATORY:  Clear to auscultation without rales, wheezing or rhonchi  ABDOMEN: Soft, non-tender, non-distended MUSCULOSKELETAL:  3+ bilateral LE edema; No deformity  SKIN: Warm and dry NEUROLOGIC:  Alert and oriented x 3 PSYCHIATRIC:  Normal affect   ASSESSMENT:    1. Essential hypertension   2. Shortness of breath   3. Elevated coronary artery calcium score   4. Coronary artery disease involving native coronary artery of native heart with angina pectoris (HCC)   5. Medication management   6. Heart murmur   7. Nonspecific abnormal electrocardiogram (ECG) (EKG)   8. Lower extremity edema    PLAN:    In order of problems listed above:  Coronary artery disease involving native coronary artery of native heart with angina pectoris (HCC) He is not having any severe chest discomfort or severe shortness of breath.  He has been able to rake the leaves to the best of his abilities but over the last several years he has noted a decrease in his exercise tolerance which may be an anginal equivalent especially given the underlying 6400  calcium score which is 97 percentile.  He may very well have underlying severe flow-limiting coronary artery disease.  It behooves [place] to check a pharmacologic stress test, prior knee injury prohibits use of treadmill.  He has been on disability since 1973.  If stress test is high risk, we will then proceed with cardiac catheterization.  If overall low risk, we will continue with plaque stabilization, starting with Crestor 10 mg once a day.  Aspirin 81 mg a day.  Recheck lipid panel in 3 months with ALT.  Ultimately may need to increase Crestor to 20 mg.  If symptoms worsen or become more worrisome, further medical attention may be warranted and if severe, hospitalization. His wife [name] helped out with historical elements.  Heart murmur Aortic stenosis type murmur noted.  Many years ago he had an echocardiogram performed.  I do not see the results of this.  Nonspecific abnormal electrocardiogram (ECG) (EKG) Left axis deviation noted sinus rhythm 74.  Eager to go on trip to [place] after Christmas.  Shared Decision Making/Informed Consent The risks [chest pain, shortness of breath, cardiac arrhythmias, dizziness, blood pressure fluctuations, myocardial infarction, stroke/transient ischemic attack, nausea, vomiting, allergic reaction, radiation exposure, metallic taste sensation and life-threatening complications (estimated to be 1 in 10,000)], benefits (risk stratification, diagnosing coronary artery disease, treatment guidance) and alternatives of a nuclear stress test were discussed in detail with Mr. Fangman and he agrees to proceed.   Follow-up: 6 months.  Medication Adjustments/Labs and Tests Ordered: Current medicines are reviewed at length with the patient today.  Concerns regarding medicines are outlined above.   Orders Placed This Encounter  Procedures   ALT   Lipid panel   MYOCARDIAL PERFUSION IMAGING   EKG 12-Lead   ECHOCARDIOGRAM COMPLETE    Meds ordered this encounter   Medications   rosuvastatin (CRESTOR) 10 MG tablet    Sig: Take 1 tablet (10 mg total) by mouth daily.    Dispense:  90 tablet    Refill:  3   aspirin EC 81 MG tablet    Sig: Take 1 tablet (81 mg total) by mouth daily. Swallow whole.    Dispense:  90 tablet    Refill:  3    Patient Instructions  Medication Instructions:  Please start Crestor 10 mg a day and Aspirin 81 mg a day. Continue all other medications as  listed.  *If you need a refill on your cardiac medications before your next appointment, please call your pharmacy*  Lab Work: Please have blood work in 3 months (Lipid/ALT)  If you have labs (blood work) drawn today and your tests are completely normal, you will receive your results only by: MyChart Message (if you have MyChart) OR A paper copy in the mail If you have any lab test that is abnormal or we need to change your treatment, we will call you to review the results.  Testing/Procedures: Your physician has requested that you have a lexiscan myoview. For further information please visit [email or web address] Please follow instruction sheet, as given.  Your physician has requested that you have an echocardiogram. Echocardiography is a painless test that uses sound waves to create images of your heart. It provides your doctor with information about the size and shape of your heart and how well your heart's chambers and valves are working. This procedure takes approximately one hour. There are no restrictions for this procedure.  Follow-Up: At [facility], you and your health needs are our priority.  As part of our continuing mission to provide you with exceptional heart care, we have created designated Provider Care Teams.  These Care Teams include your primary Cardiologist (physician) and Advanced Practice Providers (APPs -  Physician Assistants and Nurse Practitioners) who all work together to provide you with the care you need, when you need it.  We recommend  signing up for the patient portal called "MyChart".  Sign up information is provided on this After Visit Summary.  MyChart is used to connect with patients for Virtual Visits (Telemedicine).  Patients are able to view lab/test results, encounter notes, upcoming appointments, etc.  Non-urgent messages can be sent to your provider as well.   To learn more about what you can do with MyChart, go to [email or web address].    Your next appointment:   6 month(s)  The format for your next appointment:   In Person  Provider:   Dr Alcide Evener If MD is not listed, click here to update    :1}    Thank you for choosing [facility]!!     I,Mathew Stumpf,acting as a scribe for Donato Schultz, MD.,have documented all relevant documentation on the behalf of Donato Schultz, MD,as directed by  Donato Schultz, MD while in the presence of Donato Schultz, MD.  I, Donato Schultz, MD, have reviewed all documentation for this visit. The documentation on 04/14/21 for the exam, diagnosis, procedures, and orders are all accurate and complete.   Signed, Donato Schultz, MD  04/14/2021 3:27 PM    [facility]

## 2021-04-14 NOTE — Assessment & Plan Note (Addendum)
He is not having any severe chest discomfort or severe shortness of breath.  He has been able to rake the leaves to the best of his abilities but over the last several years he has noted a decrease in his exercise tolerance which may be an anginal equivalent especially given the underlying 6400 calcium score which is 97 percentile.  He may very well have underlying severe flow-limiting coronary artery disease.  It behooves [place] to check a pharmacologic stress test, prior knee injury prohibits use of treadmill.  He has been on disability since 1973.  If stress test is high risk, we will then proceed with cardiac catheterization.  If overall low risk, we will continue with plaque stabilization, starting with Crestor 10 mg once a day.  Aspirin 81 mg a day.  Recheck lipid panel in 3 months with ALT.  Ultimately may need to increase Crestor to 20 mg.  If symptoms worsen or become more worrisome, further medical attention may be warranted and if severe, hospitalization. His wife [name] Steele helped out with historical elements.

## 2021-04-17 ENCOUNTER — Other Ambulatory Visit: Payer: Self-pay | Admitting: *Deleted

## 2021-04-17 DIAGNOSIS — I25119 Atherosclerotic heart disease of native coronary artery with unspecified angina pectoris: Secondary | ICD-10-CM

## 2021-04-30 ENCOUNTER — Telehealth (HOSPITAL_COMMUNITY): Payer: Self-pay | Admitting: *Deleted

## 2021-04-30 NOTE — Telephone Encounter (Signed)
Attempted to leave a message on voicemail in reference to upcoming appointment scheduled for 05/07/21 but no answer and no answer machine. Maritssa Haughton, Adelene Idler

## 2021-05-05 ENCOUNTER — Telehealth (HOSPITAL_COMMUNITY): Payer: Self-pay | Admitting: *Deleted

## 2021-05-05 NOTE — Telephone Encounter (Signed)
Attempted to call patient regarding upcoming appointment- no answer, unable to leave a message.  [name] Steele [name]  

## 2021-05-07 ENCOUNTER — Ambulatory Visit (HOSPITAL_COMMUNITY): Payer: Medicare Other | Attending: Cardiology

## 2021-05-07 ENCOUNTER — Other Ambulatory Visit: Payer: Self-pay

## 2021-05-07 ENCOUNTER — Encounter (HOSPITAL_COMMUNITY): Payer: Self-pay | Admitting: *Deleted

## 2021-05-07 ENCOUNTER — Ambulatory Visit (HOSPITAL_BASED_OUTPATIENT_CLINIC_OR_DEPARTMENT_OTHER): Payer: Medicare Other

## 2021-05-07 DIAGNOSIS — R931 Abnormal findings on diagnostic imaging of heart and coronary circulation: Secondary | ICD-10-CM | POA: Insufficient documentation

## 2021-05-07 DIAGNOSIS — I10 Essential (primary) hypertension: Secondary | ICD-10-CM

## 2021-05-07 DIAGNOSIS — R0602 Shortness of breath: Secondary | ICD-10-CM | POA: Insufficient documentation

## 2021-05-07 LAB — ECHOCARDIOGRAM COMPLETE
AR max vel: 1.72 cm2
AV Area VTI: 1.87 cm2
AV Area mean vel: 1.71 cm2
AV Mean grad: 9 mmHg
AV Peak grad: 15.4 mmHg
Ao pk vel: 1.96 m/s
Area-P 1/2: 3.91 cm2
S' Lateral: 2.6 cm

## 2021-05-07 MED ORDER — TECHNETIUM TC 99M TETROFOSMIN IV KIT
9.8000 | PACK | Freq: Once | INTRAVENOUS | Status: AC | PRN
  Administered 2021-05-07: 9.8 via INTRAVENOUS
  Filled 2021-05-07: qty 10

## 2021-05-09 ENCOUNTER — Ambulatory Visit (HOSPITAL_COMMUNITY): Payer: Medicare Other | Attending: Cardiology

## 2021-05-09 ENCOUNTER — Other Ambulatory Visit: Payer: Self-pay

## 2021-05-09 DIAGNOSIS — R931 Abnormal findings on diagnostic imaging of heart and coronary circulation: Secondary | ICD-10-CM | POA: Insufficient documentation

## 2021-05-09 DIAGNOSIS — I10 Essential (primary) hypertension: Secondary | ICD-10-CM | POA: Insufficient documentation

## 2021-05-09 DIAGNOSIS — R0602 Shortness of breath: Secondary | ICD-10-CM | POA: Diagnosis not present

## 2021-05-09 LAB — MYOCARDIAL PERFUSION IMAGING
LV dias vol: 86 mL (ref 62–150)
LV sys vol: 27 mL
Nuc Stress EF: 68 %
Peak HR: 94 {beats}/min
Rest HR: 82 {beats}/min
Rest Nuclear Isotope Dose: 9.8 mCi
SDS: 3
SRS: 0
SSS: 3
ST Depression (mm): 0 mm
Stress Nuclear Isotope Dose: 29.7 mCi
TID: 1.1

## 2021-05-09 MED ORDER — REGADENOSON 0.4 MG/5ML IV SOLN
0.4000 mg | Freq: Once | INTRAVENOUS | Status: AC
  Administered 2021-05-09: 0.4 mg via INTRAVENOUS

## 2021-05-09 MED ORDER — TECHNETIUM TC 99M TETROFOSMIN IV KIT
29.7000 | PACK | Freq: Once | INTRAVENOUS | Status: AC | PRN
  Administered 2021-05-09: 29.7 via INTRAVENOUS
  Filled 2021-05-09: qty 30

## 2021-05-23 ENCOUNTER — Encounter: Payer: Self-pay | Admitting: *Deleted

## 2021-07-15 ENCOUNTER — Other Ambulatory Visit: Payer: Medicare Other | Admitting: *Deleted

## 2021-07-15 ENCOUNTER — Other Ambulatory Visit: Payer: Self-pay

## 2021-07-15 DIAGNOSIS — I25119 Atherosclerotic heart disease of native coronary artery with unspecified angina pectoris: Secondary | ICD-10-CM

## 2021-07-15 DIAGNOSIS — Z79899 Other long term (current) drug therapy: Secondary | ICD-10-CM | POA: Diagnosis not present

## 2021-07-15 LAB — ALT: ALT: 11 IU/L (ref 0–44)

## 2021-07-15 LAB — LIPID PANEL
Chol/HDL Ratio: 2 ratio (ref 0.0–5.0)
Cholesterol, Total: 93 mg/dL — ABNORMAL LOW (ref 100–199)
HDL: 46 mg/dL (ref >39)
LDL Chol Calc (NIH): 32 mg/dL (ref 0–99)
Triglycerides: 70 mg/dL (ref 0–149)
VLDL Cholesterol Cal: 15 mg/dL (ref 5–40)

## 2021-08-12 DIAGNOSIS — Z20822 Contact with and (suspected) exposure to covid-19: Secondary | ICD-10-CM | POA: Diagnosis not present

## 2021-09-13 DIAGNOSIS — Z20822 Contact with and (suspected) exposure to covid-19: Secondary | ICD-10-CM | POA: Diagnosis not present

## 2021-10-13 ENCOUNTER — Encounter: Payer: Self-pay | Admitting: Physician Assistant

## 2021-10-13 NOTE — Progress Notes (Unsigned)
Cardiology Office Note    Date:  10/14/2021   ID:  Kylian Loh, DOB 11/02/1940, MRN [id number]  PCP:  [name], MD  Cardiologist:  Donato Schultz, MD  Electrophysiologist:  None   Chief Complaint: f/u coronary calcification, edema  History of Present Illness:   [name] Steele is a 81 y.o. male with history of coronary artery disease by CT, chronic edema, former tobacco, DM, HTN, gout, osteoporosis, arthritis, mild AS, mild dilation of ascending aorta who is seen for follow-up. He established care with Dr. Anne Fu in 03/2021 after calcium score ordered by PCP showed CAC 6,230 (97%ile). Study also incidentally picked up evidence of chronic ILD. Dr. Anne Fu ordered stress test which is outlined below which was positive for small territory of ischemia but felt to be low risk so Dr. Anne Fu managed this medically. 2D echo showed EF 60-65%, normal RV, mild LAE, degenerative MV without significant MR/MS,  mild AS, mild aortic dilation of ascending aorta. Statin was added.  He is seen for follow-up today with his wife whom he jokingly refers to as his hearing aid as well. Overall he feels that he's been doing well. He denies any chest pain. He reports DOE with higher levels of exertion but states this is chronic without any changes at all recently. He started back on Lasix about 2 weeks ago for his edema which was worsening again. He had a hard time putting his shoes on. It's gone back down quite a bit back closer to his baseline. He states he is not a fan of the diuretic due to urinary frequency.   Labwork independently reviewed: 06/2021 LDL 32, trig 70, HDL 46, ALT 11 Scan 11/2020 Hgb 12.9, plt 212, A1c 5.5 2020 K 4.0, Cr 0.85    Cardiology Studies:   Studies reviewed are outlined and summarized above. Reports included below if pertinent.   NST 04/2021   Findings are consistent with ischemia. The study is low risk.   No ST deviation was noted.   Left ventricular function is normal.  Nuclear stress EF: 68 %. The left ventricular ejection fraction is hyperdynamic (>65%). End diastolic cavity size is normal. End systolic cavity size is normal.   Prior study not available for comparison.   Findings: Occasional PVCs in recovery. No evidence of infarction. There is evidence of apical ischemia, small territory (< 10% of LV myocardium).    Conclusions: Stress test is positive. Low risk study based on small defect.   2D echo 04/2021  1. Left ventricular ejection fraction, by estimation, is 60 to 65%. The  left ventricle has normal function. The left ventricle has no regional  wall motion abnormalities. Left ventricular diastolic parameters are  indeterminate.   2. Right ventricular systolic function is normal. The right ventricular  size is normal. There is normal pulmonary artery systolic pressure.   3. Left atrial size was mildly dilated.   4. The mitral valve is degenerative. Trivial mitral valve regurgitation.  No evidence of mitral stenosis.   5. The aortic valve is tricuspid. There is moderate calcification of the  aortic valve. There is mild thickening of the aortic valve. Aortic valve  regurgitation is not visualized. Mild aortic valve stenosis.   6. Aortic dilatation noted. There is borderline dilatation of the  ascending aorta, measuring 38 mm.   7. The inferior vena cava is normal in size with <50% respiratory  variability, suggesting right atrial pressure of 8 mmHg.   Comparison(s): No prior Echocardiogram.    Calcium  scoring 01/2021  FINDINGS: CORONARY CALCIUM SCORES:   Left Main: 0   LAD: 4,143   LCx: 0   RCA: 2,087   Total Agatston Score: 6,230   The calcium score is nearly impossible to accurately quantify due to contiguous nature of calcium with adjacent calcified aortic plaque as well. Calcium detection software is unable to separate some of the calcifications and the LAD score also includes the left circumflex.   MESA database  percentile: 97   AORTA MEASUREMENTS:   Ascending Aorta: 37 mm   Descending Aorta: 31 mm   OTHER FINDINGS:   The heart size is within normal limits. No pericardial fluid is identified. Visualized segments of the thoracic aorta and central pulmonary arteries are normal in caliber. Visualized mediastinum and hilar regions demonstrate no lymphadenopathy or masses. Progression subpleural interstitial thickening with potential early fibrotic disease at the lung bases. Visualized lungs show no evidence of pulmonary edema, consolidation, pneumothorax, nodule or pleural fluid. Visualized upper abdomen and bony structures are unremarkable. IMPRESSION: 1. Extensive calcified plaque in the distribution of the coronary arteries. Exact quantification is not entirely accurate due to contiguous nature of plaque extending throughout the coronary artery tree which also included calculation of some adjacent plaque in the proximal thoracic aorta. Calculated score of 6,230 is at the 97th percentile for the patient's age, sex and race. 2. Evidence of chronic interstitial lung disease which shows progression since a prior CT in 2019 with evidence of possible early pulmonary fibrosis at the lung bases. Correlation suggested clinically with any respiratory symptoms or prior pulmonary workup.     Electronically Signed   By: Irish Lack M.D.   On: 02/11/2021 15:07    Past Medical History:  Diagnosis Date   Arthritis    Chronic urinary tract infection    Coronary artery calcification seen on CT scan    Diabetes mellitus without complication (HCC)    Gout    Hypertension    Mild aortic stenosis    Mild dilation of ascending aorta (HCC)    Osteoporosis     Past Surgical History:  Procedure Laterality Date   CHOLECYSTECTOMY N/A 07/04/2018   Procedure: LAPAROSCOPIC CHOLECYSTECTOMY WITH INTRAOPERATIVE CHOLANGIOGRAM;  Surgeon: Abigail Miyamoto, MD;  Location: [facility];  Service: General;   Laterality: N/A;   ERCP N/A 07/05/2018   Procedure: ENDOSCOPIC RETROGRADE CHOLANGIOPANCREATOGRAPHY (ERCP);  Surgeon: Vida Rigger, MD;  Location: [facility];  Service: Endoscopy;  Laterality: N/A;   JOINT REPLACEMENT Bilateral    REMOVAL OF STONES  07/05/2018   Procedure: REMOVAL OF STONES;  Surgeon: Vida Rigger, MD;  Location: [facility];  Service: Endoscopy;;   SPHINCTEROTOMY  07/05/2018   Procedure: Dennison Mascot;  Surgeon: Vida Rigger, MD;  Location: [facility];  Service: Endoscopy;;    Current Medications: Current Meds  Medication Sig   amLODipine (NORVASC) 10 MG tablet Take 10 mg by mouth daily.   aspirin EC 81 MG tablet Take 1 tablet (81 mg total) by mouth daily. Swallow whole.   colchicine 0.6 MG tablet Take 0.6 mg by mouth daily as needed (gout).    furosemide (LASIX) 40 MG tablet Take 40 mg by mouth daily.   ibuprofen (ADVIL,MOTRIN) 600 MG tablet Take 1 tablet (600 mg total) by mouth every 6 (six) hours as needed for moderate pain.   metFORMIN (GLUCOPHAGE-XR) 750 MG 24 hr tablet Take 750 mg by mouth daily after breakfast.   rosuvastatin (CRESTOR) 10 MG tablet Take 1 tablet (10 mg total) by mouth daily.  telmisartan (MICARDIS) 80 MG tablet Take 80 mg by mouth daily.     Allergies:   Erythromycin and Penicillins   Social History   Socioeconomic History   Marital status: Married    Spouse name: Not on file   Number of children: Not on file   Years of education: Not on file   Highest education level: Not on file  Occupational History   Not on file  Tobacco Use   Smoking status: Former    Packs/day: 1.00    Years: 60.00    Pack years: 60.00    Types: Cigarettes    Quit date: 50    Years since quitting: 35.4   Smokeless tobacco: Never  Substance and Sexual Activity   Alcohol use: Yes    Alcohol/week: 0.0 standard drinks    Comment: social   Drug use: No   Sexual activity: Not on file  Other Topics Concern   Not on file  Social History Narrative   Not on  file   Social Determinants of Health   Financial Resource Strain: Not on file  Food Insecurity: Not on file  Transportation Needs: Not on file  Physical Activity: Not on file  Stress: Not on file  Social Connections: Not on file     Family History:  The patient's family history includes Heart disease in his sister.  ROS:   Please see the history of present illness.  All other systems are reviewed and otherwise negative.    EKG(s)/Additional Labs   EKG:  EKG is ordered today, personally reviewed, demonstrating NSR 77bpm first degree AVB, left axis deviation, no acute change from prior.  Recent Labs: 07/15/2021: ALT 11  Recent Lipid Panel    Component Value Date/Time   CHOL 93 (L) 07/15/2021 1029   TRIG 70 07/15/2021 1029   HDL 46 07/15/2021 1029   CHOLHDL 2.0 07/15/2021 1029   LDLCALC 32 07/15/2021 1029    PHYSICAL EXAM:    VS:  BP 120/60   Pulse 77   Ht 5\' 10"  (1.778 m)   Wt 159 lb (72.1 kg)   SpO2 96%   BMI 22.81 kg/m   BMI: Body mass index is 22.81 kg/m.  GEN: Well nourished, well developed male in no acute distress HEENT: normocephalic, atraumatic Neck: no JVD, carotid bruits, or masses Cardiac: RRR; no murmurs, rubs, or gallops, no edema  Respiratory:  clear to auscultation bilaterally, normal work of breathing GI: soft, nontender, nondistended, + BS MS: no deformity or atrophy Skin: warm and dry, no rash Neuro:  Alert and Oriented x 3, Strength and sensation are intact, follows commands Psych: euthymic mood, full affect  Wt Readings from Last 3 Encounters:  10/14/21 159 lb (72.1 kg)  05/07/21 166 lb (75.3 kg)  04/14/21 166 lb 9.6 oz (75.6 kg)     ASSESSMENT & PLAN:   1. Coronary artery disease by CT - nuclear stress test 04/2022 showed evidence of apical ischemia in a small territory but overall low risk. Dr. 05/2022 recommended to treat this medically. In the absence of any new or worsening symptoms, this still seems very reasonable. He will  notify for any new concerns. In the meantime he will continue ASA/statin.  2. Lower extremity edema - reports long history of this, recently started back on Lasix 40mg  daily due to recurrent worsening. He is not a huge fan of this due to frequent urination. He has to get up 3x/night when taking this. I would like to update CMET,  CBC, TSH for our labs today. Based on these results, I will consider making changes to his amlodipine to see if this helps with his edema. He also has follow-up with his PCP in July which could help reassess the swelling and BP response at that time. We also discussed use of compression hose and elevation as well as sodium restriction. He does not drink excessive amounts of fluid. Echo was reviewed and there was no significant right heart dysfunction noted. I did offer referral to pulm for incidental pickup of ILD on prior CT but he politely declined.  3. Mild AS and mild dilation of ascending aorta - no symptoms related to this presently, will reassess by echo in 04/2022 (predominantly for the widened aorta). Continue BP management.  4. Essential HTN - see number 2 regarding potential med changes forthcoming. We will await labs today and make recommendation from there.    Disposition: F/u with Dr. Anne FuSkains or myself in 05/2022 (after 04/2022 echocardiogram), but may request sooner f/u based on labs.   Medication Adjustments/Labs and Tests Ordered: Current medicines are reviewed at length with the patient today.  Concerns regarding medicines are outlined above. Medication changes, Labs and Tests ordered today are summarized above and listed in the Patient Instructions accessible in Encounters.   Signed, Laurann Montanaayna N Reade Trefz, PA-C  10/14/2021 1:53 PM    [facility] Phone: [phone]) [phone]; Fax: [phone]) [phone]

## 2021-10-14 ENCOUNTER — Encounter: Payer: Self-pay | Admitting: Physician Assistant

## 2021-10-14 ENCOUNTER — Ambulatory Visit (INDEPENDENT_AMBULATORY_CARE_PROVIDER_SITE_OTHER): Payer: Medicare Other | Admitting: Physician Assistant

## 2021-10-14 VITALS — BP 120/60 | HR 77 | Ht 70.0 in | Wt 159.0 lb

## 2021-10-14 DIAGNOSIS — R609 Edema, unspecified: Secondary | ICD-10-CM

## 2021-10-14 DIAGNOSIS — R600 Localized edema: Secondary | ICD-10-CM | POA: Diagnosis not present

## 2021-10-14 DIAGNOSIS — I2510 Atherosclerotic heart disease of native coronary artery without angina pectoris: Secondary | ICD-10-CM | POA: Diagnosis not present

## 2021-10-14 DIAGNOSIS — I10 Essential (primary) hypertension: Secondary | ICD-10-CM | POA: Diagnosis not present

## 2021-10-14 DIAGNOSIS — I350 Nonrheumatic aortic (valve) stenosis: Secondary | ICD-10-CM | POA: Diagnosis not present

## 2021-10-14 DIAGNOSIS — I77810 Thoracic aortic ectasia: Secondary | ICD-10-CM | POA: Diagnosis not present

## 2021-10-14 NOTE — Patient Instructions (Signed)
Medication Instructions:  Your physician recommends that you continue on your current medications as directed. Please refer to the Current Medication list given to you today. *If you need a refill on your cardiac medications before your next appointment, please call your pharmacy*   Lab Work: TODAY-CMET, CBC & TSH If you have labs (blood work) drawn today and your tests are completely normal, you will receive your results only by: [facility] (if you have MyChart) OR A paper copy in the mail If you have any lab test that is abnormal or we need to change your treatment, we will call you to review the results.   Testing/Procedures: Your physician has requested that you have an echocardiogram. Echocardiography is a painless test that uses sound waves to create images of your heart. It provides your doctor with information about the size and shape of your heart and how well your heart's chambers and valves are working. This procedure takes approximately one hour. There are no restrictions for this procedure. SCHEDULE ECHO IN DECEMBER 2023  Follow-Up: At [facility], you and your health needs are our priority.  As part of our continuing mission to provide you with exceptional heart care, we have created designated Provider Care Teams.  These Care Teams include your primary Cardiologist (physician) and Advanced Practice Providers (APPs -  Physician Assistants and Nurse Practitioners) who all work together to provide you with the care you need, when you need it.  We recommend signing up for the patient portal called "MyChart".  Sign up information is provided on this After Visit Summary.  MyChart is used to connect with patients for Virtual Visits (Telemedicine).  Patients are able to view lab/test results, encounter notes, upcoming appointments, etc.  Non-urgent messages can be sent to your provider as well.   To learn more about what you can do with MyChart, go to [email or web address].     Your next appointment:   7 month(s) (January 2024)  The format for your next appointment:   In Person  Provider:   Candee Furbish, MD or Melina Copa, PA    Other Instructions   Important Information About Sugar

## 2021-10-15 LAB — CBC
Hematocrit: 38.9 % (ref 37.5–51.0)
Hemoglobin: 12.9 g/dL — ABNORMAL LOW (ref 13.0–17.7)
MCH: 31.4 pg (ref 26.6–33.0)
MCHC: 33.2 g/dL (ref 31.5–35.7)
MCV: 95 fL (ref 79–97)
Platelets: 231 10*3/uL (ref 150–450)
RBC: 4.11 x10E6/uL — ABNORMAL LOW (ref 4.14–5.80)
RDW: 12.8 % (ref 11.6–15.4)
WBC: 5.5 10*3/uL (ref 3.4–10.8)

## 2021-10-15 LAB — COMPREHENSIVE METABOLIC PANEL
ALT: 17 IU/L (ref 0–44)
AST: 28 IU/L (ref 0–40)
Albumin/Globulin Ratio: 1.7 (ref 1.2–2.2)
Albumin: 4.2 g/dL (ref 3.6–4.6)
Alkaline Phosphatase: 59 IU/L (ref 44–121)
BUN/Creatinine Ratio: 20 (ref 10–24)
BUN: 26 mg/dL (ref 8–27)
Bilirubin Total: 0.7 mg/dL (ref 0.0–1.2)
CO2: 27 mmol/L (ref 20–29)
Calcium: 9.5 mg/dL (ref 8.6–10.2)
Chloride: 99 mmol/L (ref 96–106)
Creatinine, Ser: 1.31 mg/dL — ABNORMAL HIGH (ref 0.76–1.27)
Globulin, Total: 2.5 g/dL (ref 1.5–4.5)
Glucose: 98 mg/dL (ref 70–99)
Potassium: 4.2 mmol/L (ref 3.5–5.2)
Sodium: 140 mmol/L (ref 134–144)
Total Protein: 6.7 g/dL (ref 6.0–8.5)
eGFR: 55 mL/min/{1.73_m2} — ABNORMAL LOW (ref >59)

## 2021-10-15 LAB — TSH: TSH: 1.35 u[IU]/mL (ref 0.450–4.500)

## 2021-10-17 ENCOUNTER — Other Ambulatory Visit: Payer: Self-pay

## 2021-10-17 DIAGNOSIS — I2510 Atherosclerotic heart disease of native coronary artery without angina pectoris: Secondary | ICD-10-CM

## 2021-10-17 DIAGNOSIS — Z79899 Other long term (current) drug therapy: Secondary | ICD-10-CM

## 2021-10-24 ENCOUNTER — Other Ambulatory Visit: Payer: Medicare Other

## 2021-10-24 ENCOUNTER — Other Ambulatory Visit: Payer: Self-pay | Admitting: *Deleted

## 2021-10-24 DIAGNOSIS — I2510 Atherosclerotic heart disease of native coronary artery without angina pectoris: Secondary | ICD-10-CM | POA: Diagnosis not present

## 2021-10-24 DIAGNOSIS — Z79899 Other long term (current) drug therapy: Secondary | ICD-10-CM | POA: Diagnosis not present

## 2021-10-25 LAB — SPECIMEN STATUS REPORT

## 2021-10-25 LAB — BASIC METABOLIC PANEL
BUN/Creatinine Ratio: 16 (ref 10–24)
BUN: 20 mg/dL (ref 8–27)
CO2: 22 mmol/L (ref 20–29)
Calcium: 9.3 mg/dL (ref 8.6–10.2)
Chloride: 104 mmol/L (ref 96–106)
Creatinine, Ser: 1.25 mg/dL (ref 0.76–1.27)
Glucose: 99 mg/dL (ref 70–99)
Potassium: 4.1 mmol/L (ref 3.5–5.2)
Sodium: 144 mmol/L (ref 134–144)
eGFR: 58 mL/min/{1.73_m2} — ABNORMAL LOW (ref >59)

## 2021-11-06 ENCOUNTER — Encounter: Payer: Self-pay | Admitting: Physician Assistant

## 2021-11-07 ENCOUNTER — Ambulatory Visit (INDEPENDENT_AMBULATORY_CARE_PROVIDER_SITE_OTHER): Payer: Medicare Other | Admitting: Physician Assistant

## 2021-11-07 ENCOUNTER — Encounter: Payer: Self-pay | Admitting: Physician Assistant

## 2021-11-07 VITALS — BP 125/68 | HR 82 | Ht 70.0 in | Wt 157.0 lb

## 2021-11-07 DIAGNOSIS — I350 Nonrheumatic aortic (valve) stenosis: Secondary | ICD-10-CM

## 2021-11-07 DIAGNOSIS — R600 Localized edema: Secondary | ICD-10-CM

## 2021-11-07 DIAGNOSIS — N1831 Chronic kidney disease, stage 3a: Secondary | ICD-10-CM | POA: Diagnosis not present

## 2021-11-07 DIAGNOSIS — I2510 Atherosclerotic heart disease of native coronary artery without angina pectoris: Secondary | ICD-10-CM

## 2021-11-07 DIAGNOSIS — I10 Essential (primary) hypertension: Secondary | ICD-10-CM

## 2021-11-07 DIAGNOSIS — I77810 Thoracic aortic ectasia: Secondary | ICD-10-CM | POA: Diagnosis not present

## 2021-11-07 MED ORDER — CARVEDILOL 6.25 MG PO TABS: 6.2500 mg | ORAL_TABLET | Freq: Two times a day (BID) | ORAL | 1 refills | Status: DC

## 2021-11-07 MED ORDER — FUROSEMIDE 20 MG PO TABS: 20.0000 mg | ORAL_TABLET | Freq: Every day | ORAL | 1 refills | Status: DC

## 2021-11-08 LAB — BASIC METABOLIC PANEL
BUN/Creatinine Ratio: 16 (ref 10–24)
BUN: 15 mg/dL (ref 8–27)
CO2: 25 mmol/L (ref 20–29)
Calcium: 9.4 mg/dL (ref 8.6–10.2)
Chloride: 100 mmol/L (ref 96–106)
Creatinine, Ser: 0.96 mg/dL (ref 0.76–1.27)
Glucose: 85 mg/dL (ref 70–99)
Potassium: 4.2 mmol/L (ref 3.5–5.2)
Sodium: 140 mmol/L (ref 134–144)
eGFR: 79 mL/min/{1.73_m2} (ref >59)

## 2021-11-21 ENCOUNTER — Telehealth: Payer: Self-pay | Admitting: Physician Assistant

## 2021-11-21 NOTE — Telephone Encounter (Signed)
Pleased with these readings! Please make sure he's overall tolerating the med change well (we stopped amlodipine and started carvedilol). If so, keep f/u with me as planned later this month. Bring log to visit.

## 2021-11-21 NOTE — Telephone Encounter (Signed)
Patient's wife called to report BP readings:  6/28 4pm   126/75  55  915am  126/72  56 6/29 3pm  161/87  55 6/30  1130pm 126/75 62 7/1 930am   134/75 54        1pm       121/73 58 7/3      1130am  121/70 55 7/4      1pm        121/73 58 7/6      1140am  129/81 53

## 2021-11-21 NOTE — Telephone Encounter (Signed)
Returned call to patients wife (okay per DPR) and made her aware of Dayna Dunn PA-C's recommendations. Patient is tolerating the medication change well, no symptoms to report. Advised patients wife to call back with any issues, questions, or concerns. Patients wife verbalized understanding.

## 2021-12-01 DIAGNOSIS — I10 Essential (primary) hypertension: Secondary | ICD-10-CM | POA: Diagnosis not present

## 2021-12-01 DIAGNOSIS — D649 Anemia, unspecified: Secondary | ICD-10-CM | POA: Diagnosis not present

## 2021-12-01 DIAGNOSIS — E119 Type 2 diabetes mellitus without complications: Secondary | ICD-10-CM | POA: Diagnosis not present

## 2021-12-05 DIAGNOSIS — E119 Type 2 diabetes mellitus without complications: Secondary | ICD-10-CM | POA: Diagnosis not present

## 2021-12-05 DIAGNOSIS — I700 Atherosclerosis of aorta: Secondary | ICD-10-CM | POA: Diagnosis not present

## 2021-12-05 DIAGNOSIS — Z0000 Encounter for general adult medical examination without abnormal findings: Secondary | ICD-10-CM | POA: Diagnosis not present

## 2021-12-05 DIAGNOSIS — E1140 Type 2 diabetes mellitus with diabetic neuropathy, unspecified: Secondary | ICD-10-CM | POA: Diagnosis not present

## 2021-12-05 DIAGNOSIS — E11628 Type 2 diabetes mellitus with other skin complications: Secondary | ICD-10-CM | POA: Diagnosis not present

## 2021-12-05 DIAGNOSIS — I10 Essential (primary) hypertension: Secondary | ICD-10-CM | POA: Diagnosis not present

## 2021-12-05 DIAGNOSIS — R0982 Postnasal drip: Secondary | ICD-10-CM | POA: Diagnosis not present

## 2021-12-05 DIAGNOSIS — D649 Anemia, unspecified: Secondary | ICD-10-CM | POA: Diagnosis not present

## 2021-12-05 DIAGNOSIS — R600 Localized edema: Secondary | ICD-10-CM | POA: Diagnosis not present

## 2021-12-05 DIAGNOSIS — M1A09X1 Idiopathic chronic gout, multiple sites, with tophus (tophi): Secondary | ICD-10-CM | POA: Diagnosis not present

## 2021-12-05 DIAGNOSIS — I2510 Atherosclerotic heart disease of native coronary artery without angina pectoris: Secondary | ICD-10-CM | POA: Diagnosis not present

## 2021-12-05 DIAGNOSIS — L84 Corns and callosities: Secondary | ICD-10-CM | POA: Diagnosis not present

## 2021-12-05 DIAGNOSIS — J8410 Pulmonary fibrosis, unspecified: Secondary | ICD-10-CM | POA: Diagnosis not present

## 2021-12-07 NOTE — Progress Notes (Signed)
Cardiology Office Note    Date:  12/08/2021   ID:  [name], DOB 12-13-1940, MRN [id number]  PCP:  Deland Pretty, MD  Cardiologist:  Candee Furbish, MD  Electrophysiologist:  None   Chief Complaint: f/u edema, HTN  History of Present Illness:   Khazi Breckenridge is a 81 y.o. 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    He established care with Dr. Marlou Porch in 03/2021 after calcium score ordered by PCP showed CAC 6,230 (97%ile). Study also incidentally picked up evidence of chronic ILD. Dr. Marlou Porch ordered stress test which is outlined below which was positive for small territory of ischemia but felt to be low risk so Dr. Marlou Porch managed this medically. 2D echo 04/2021 showed EF 60-65%, normal RV, mild LAE, degenerative MV without significant MR/MS, mild AS, mild aortic dilation of ascending aorta. Statin was added. He also has a prior history of significant lower extremity edema.  He has chronic unchanged DOE. I had offered referral to pulm for incidental pickup of ILD on CT but he declined. He was seen recently in follow-up 09/2021. He had been started back on Lasix by PCP about 2 weeks prior for recurrent LE swelling. He did not like to take this due to the urinary frequency and nocturia. I considered whether the amlodipine was causing his edema. Lasix also seemed to bump his Cr as well to 1.31. Over the course of the last several visits, we adjusted his medicines, ultimately settling on a swap from amlodipine to carvedilol with lower dose Lasix 20mg  daily.   He is seen for follow-up today doing well. He and his wife feel his edema has improved greatly from where it first started. Though his BP is slightly up today, they had a hard time finding a parking space and were rushing to get here. They report  SBPs running <130 at home. He has noticed over the last few weeks he is having to urinate more often. The urinary stream is slow. He saw PCP on Friday and is not sure if he mentioned this to them. Labs were reviewed below with repeat Cr 1.10, UA with small leuks, 1+ bacteria, rare epithelial, 0-2 WBC. He reports a hx of recurrent E Coli UTIs but no recent infective symptoms otherwise. Overall he is pleased with how he is feeling.    Labwork independently reviewed: Continuity labs from PCP 12/01/21 - A1C 5.6, Hgb 11.9, LFTS ok, K 4.0, Cr 1.10, Na 143, UA small leuks, 1+ bacteria, rare epithelial, 0-2 WBC 11/07/21 K 4.2, Cr 0.96 10/2021 K 4.1, Cr 1.25 09/2021 Cr 1.31, TSH wnl, Hgb 12.9, LFTs ok 06/2021 LDL 32, trig 70, HDL 46, ALT 11 Scan 11/2020 Hgb 12.9, plt 212, A1c 5.5 2020 K 4.0, Cr 0.85     Cardiology Studies:   Studies reviewed are outlined and summarized above. Reports included below if pertinent.   NST 04/2021   Findings are consistent with ischemia. The study is low risk.   No ST deviation was noted.   Left ventricular function is normal. Nuclear stress EF: 68 %. The left ventricular ejection fraction is hyperdynamic (>65%). End diastolic cavity size is normal. End systolic cavity size is normal.   Prior study not available for comparison.   Findings: Occasional PVCs in recovery. No evidence of infarction. There is  evidence of apical ischemia, small territory (< 10% of LV myocardium).    Conclusions: Stress test is positive. Low risk study based on small defect.   2D echo 04/2021  1. Left ventricular ejection fraction, by estimation, is 60 to 65%. The  left ventricle has normal function. The left ventricle has no regional  wall motion abnormalities. Left ventricular diastolic parameters are  indeterminate.   2. Right ventricular systolic function is normal. The right ventricular  size is normal. There is normal pulmonary artery systolic pressure.   3. Left atrial size was  mildly dilated.   4. The mitral valve is degenerative. Trivial mitral valve regurgitation.  No evidence of mitral stenosis.   5. The aortic valve is tricuspid. There is moderate calcification of the  aortic valve. There is mild thickening of the aortic valve. Aortic valve  regurgitation is not visualized. Mild aortic valve stenosis.   6. Aortic dilatation noted. There is borderline dilatation of the  ascending aorta, measuring 38 mm.   7. The inferior vena cava is normal in size with <50% respiratory  variability, suggesting right atrial pressure of 8 mmHg.   Comparison(s): No prior Echocardiogram.      Calcium scoring 01/2021   FINDINGS: CORONARY CALCIUM SCORES:   Left Main: 0   LAD: 4,143   LCx: 0   RCA: 2,087   Total Agatston Score: 6,230   The calcium score is nearly impossible to accurately quantify due to contiguous nature of calcium with adjacent calcified aortic plaque as well. Calcium detection software is unable to separate some of the calcifications and the LAD score also includes the left circumflex.   MESA database percentile: 97   AORTA MEASUREMENTS:   Ascending Aorta: 37 mm   Descending Aorta: 31 mm   OTHER FINDINGS:   The heart size is within normal limits. No pericardial fluid is identified. Visualized segments of the thoracic aorta and central pulmonary arteries are normal in caliber. Visualized mediastinum and hilar regions demonstrate no lymphadenopathy or masses. Progression subpleural interstitial thickening with potential early fibrotic disease at the lung bases. Visualized lungs show no evidence of pulmonary edema, consolidation, pneumothorax, nodule or pleural fluid. Visualized upper abdomen and bony structures are unremarkable. IMPRESSION: 1. Extensive calcified plaque in the distribution of the coronary arteries. Exact quantification is not entirely accurate due to contiguous nature of plaque extending throughout the coronary  artery tree which also included calculation of some adjacent plaque in the proximal thoracic aorta. Calculated score of 6,230 is at the 97th percentile for the patient's age, sex and race. 2. Evidence of chronic interstitial lung disease which shows progression since a prior CT in 2019 with evidence of possible early pulmonary fibrosis at the lung bases. Correlation suggested clinically with any respiratory symptoms or prior pulmonary workup.     Electronically Signed   By: Irish Lack M.D.   On: 02/11/2021 15:07    Past Medical History:  Diagnosis Date   Arthritis    Chronic kidney disease, stage 3a (HCC)    Chronic urinary tract infection    Coronary artery calcification seen on CT scan    Diabetes mellitus without complication (HCC)    Gout    Hypertension    ILD (interstitial lung disease) (HCC)    by CT 01/2021   Mild aortic stenosis    Mild dilation of ascending aorta (HCC)    Osteoporosis     Past Surgical History:  Procedure Laterality Date   CHOLECYSTECTOMY N/A 07/04/2018  Procedure: LAPAROSCOPIC CHOLECYSTECTOMY WITH INTRAOPERATIVE CHOLANGIOGRAM;  Surgeon: Coralie Keens, MD;  Location: [facility];  Service: General;  Laterality: N/A;   ERCP N/A 07/05/2018   Procedure: ENDOSCOPIC RETROGRADE CHOLANGIOPANCREATOGRAPHY (ERCP);  Surgeon: Clarene Essex, MD;  Location: [facility];  Service: Endoscopy;  Laterality: N/A;   JOINT REPLACEMENT Bilateral    REMOVAL OF STONES  07/05/2018   Procedure: REMOVAL OF STONES;  Surgeon: Clarene Essex, MD;  Location: [facility];  Service: Endoscopy;;   SPHINCTEROTOMY  07/05/2018   Procedure: Joan Mayans;  Surgeon: Clarene Essex, MD;  Location: [facility];  Service: Endoscopy;;    Current Medications: Current Meds  Medication Sig   Acetaminophen (TYLENOL PO) Take by mouth.   aspirin EC 81 MG tablet Take 1 tablet (81 mg total) by mouth daily. Swallow whole.   carvedilol (COREG) 6.25 MG tablet Take 1 tablet (6.25 mg total) by mouth  2 (two) times daily.   colchicine 0.6 MG tablet Take 0.6 mg by mouth daily as needed (gout).    CRANBERRY PO Take 1 capsule by mouth daily.   furosemide (LASIX) 20 MG tablet Take 1 tablet (20 mg total) by mouth daily.   metFORMIN (GLUCOPHAGE-XR) 750 MG 24 hr tablet Take 750 mg by mouth daily after breakfast.   rosuvastatin (CRESTOR) 10 MG tablet Take 1 tablet (10 mg total) by mouth daily.   telmisartan (MICARDIS) 80 MG tablet Take 80 mg by mouth daily.      Allergies:   Erythromycin and Penicillins   Social History   Socioeconomic History   Marital status: Married    Spouse name: Not on file   Number of children: Not on file   Years of education: Not on file   Highest education level: Not on file  Occupational History   Not on file  Tobacco Use   Smoking status: Former    Packs/day: 1.00    Years: 60.00    Total pack years: 60.00    Types: Cigarettes    Quit date: 69    Years since quitting: 35.5   Smokeless tobacco: Never  Substance and Sexual Activity   Alcohol use: Yes    Alcohol/week: 0.0 standard drinks of alcohol    Comment: social   Drug use: No   Sexual activity: Not on file  Other Topics Concern   Not on file  Social History Narrative   Not on file   Social Determinants of Health   Financial Resource Strain: Not on file  Food Insecurity: Not on file  Transportation Needs: Not on file  Physical Activity: Not on file  Stress: Not on file  Social Connections: Not on file     Family History:  The patient's family history includes Heart disease in his sister.  ROS:   Please see the history of present illness.  All other systems are reviewed and otherwise negative.    EKG(s)/Additional Labs   EKG:  EKG is not ordered today  Recent Labs: 10/14/2021: ALT 17; Hemoglobin 12.9; Platelets 231; TSH 1.350 11/07/2021: BUN 15; Creatinine, Ser 0.96; Potassium 4.2; Sodium 140  Recent Lipid Panel    Component Value Date/Time   CHOL 93 (L) 07/15/2021 1029    TRIG 70 07/15/2021 1029   HDL 46 07/15/2021 1029   CHOLHDL 2.0 07/15/2021 1029   LDLCALC 32 07/15/2021 1029    PHYSICAL EXAM:    VS:  BP 138/82   Pulse 65   Ht 5\' 10"  (1.778 m)   Wt 157 lb (71.2 kg)  SpO2 98%   BMI 22.53 kg/m   BMI: Body mass index is 22.53 kg/m.  GEN: Well nourished, well developed male in no acute distress HEENT: normocephalic, atraumatic Neck: no JVD, carotid bruits, or masses Cardiac: RRR; no murmurs, rubs, or gallops, mild ankle edema largely improved from prior with zippered compression stockings in place Respiratory:  clear to auscultation bilaterally, normal work of breathing GI: soft, nontender, nondistended, + BS MS: no deformity or atrophy Skin: warm and dry, no rash Neuro:  Alert and Oriented x 3, Strength and sensation are intact, follows commands Psych: euthymic mood, full affect  Wt Readings from Last 3 Encounters:  12/08/21 157 lb (71.2 kg)  11/07/21 157 lb (71.2 kg)  10/14/21 159 lb (72.1 kg)     ASSESSMENT & PLAN:   1. 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 Doing better after we transitioned from amlodipine to carvedilol. He does complain of some increased urination over the last few weeks. They inquired whether carvedilol had a diuretic effect and it does not. He also reports a slow urine stream at times. Since edema has improved, will change Lasix to PRN only. I told him to touch base with PCP about UA findings and frequent urination to get his thoughts; prostate issue is also a consideration as well. He is well appearing and non-toxic today. He will notify for any increasing symptoms. As above, previously declined referral to pulm for ILD eval.  2. Essential HTN - suboptimal on exam today but wife reports good control at home. Reviewed recent readings she called in earlier this month and they looked good. They will continue to follow at home regularly and notify if running >130/80. If he were to develop  worsening HTN in the future, not sure I would increase carvedilol since home HRs were in the 50s by the last phone call. Could certainly consider low dose chlorthalidone or spironolactone with close lab f/u. Continue telmisartan as well, already at max dose.  This was a focused visit to follow up edema and HTN. Please see visit dated 11/07/21 which addresses issues of coronary disease, mild AS, and mild dilation of ascending aorta. Has echo already planned 04/2022 which we will keep (predominantly for widened aorta rather than AS).    Disposition: F/u with Dr. Anne Fu in January 2024 after echo (6 months).   Medication Adjustments/Labs and Tests Ordered: Current medicines are reviewed at length with the patient today.  Concerns regarding medicines are outlined above. Medication changes, Labs and Tests ordered today are summarized above and listed in the Patient Instructions accessible in Encounters.   Signed, Laurann Montana, PA-C  12/08/2021 3:42 PM    [facility] Phone: [phone]; Fax: [phone]

## 2021-12-08 ENCOUNTER — Encounter: Payer: Self-pay | Admitting: Physician Assistant

## 2021-12-08 ENCOUNTER — Ambulatory Visit (INDEPENDENT_AMBULATORY_CARE_PROVIDER_SITE_OTHER): Payer: Medicare Other | Admitting: Physician Assistant

## 2021-12-08 VITALS — BP 138/82 | HR 65 | Ht 70.0 in | Wt 157.0 lb

## 2021-12-08 DIAGNOSIS — I350 Nonrheumatic aortic (valve) stenosis: Secondary | ICD-10-CM

## 2021-12-08 DIAGNOSIS — I77810 Thoracic aortic ectasia: Secondary | ICD-10-CM

## 2021-12-08 DIAGNOSIS — R600 Localized edema: Secondary | ICD-10-CM | POA: Diagnosis not present

## 2021-12-08 DIAGNOSIS — I10 Essential (primary) hypertension: Secondary | ICD-10-CM

## 2021-12-08 DIAGNOSIS — I2510 Atherosclerotic heart disease of native coronary artery without angina pectoris: Secondary | ICD-10-CM

## 2021-12-08 DIAGNOSIS — N182 Chronic kidney disease, stage 2 (mild): Secondary | ICD-10-CM

## 2021-12-08 MED ORDER — FUROSEMIDE 20 MG PO TABS: 20.0000 mg | ORAL_TABLET | Freq: Every day | ORAL | 3 refills | Status: DC | PRN

## 2021-12-08 NOTE — Patient Instructions (Signed)
Medication Instructions:  Take Lasix on a as needed basis *If you need a refill on your cardiac medications before your next appointment, please call your pharmacy*   Lab Work: None ordered   Testing/Procedures: None Ordered   Follow-Up: At [facility], you and your health needs are our priority.  As part of our continuing mission to provide you with exceptional heart care, we have created designated Provider Care Teams.  These Care Teams include your primary Cardiologist (physician) and Advanced Practice Providers (APPs -  Physician Assistants and Nurse Practitioners) who all work together to provide you with the care you need, when you need it.  We recommend signing up for the patient portal called "MyChart".  Sign up information is provided on this After Visit Summary.  MyChart is used to connect with patients for Virtual Visits (Telemedicine).  Patients are able to view lab/test results, encounter notes, upcoming appointments, etc.  Non-urgent messages can be sent to your provider as well.   To learn more about what you can do with MyChart, go to [email or web address].    Your next appointment:   January 2024  The format for your next appointment:   In Person  Provider:   Donato Schultz, MD     Other Instructions Please monitor your blood pressure with heart rate at least 3 times a week. Call our office if you tend to get readings of greater than 130 on the top number or 80 on the bottom number.  Important Information About Sugar

## 2021-12-16 ENCOUNTER — Ambulatory Visit (INDEPENDENT_AMBULATORY_CARE_PROVIDER_SITE_OTHER): Payer: Medicare Other | Admitting: Podiatry

## 2021-12-16 ENCOUNTER — Encounter: Payer: Self-pay | Admitting: Podiatry

## 2021-12-16 DIAGNOSIS — B351 Tinea unguium: Secondary | ICD-10-CM | POA: Diagnosis not present

## 2021-12-16 DIAGNOSIS — M25472 Effusion, left ankle: Secondary | ICD-10-CM | POA: Diagnosis not present

## 2021-12-16 DIAGNOSIS — M79674 Pain in right toe(s): Secondary | ICD-10-CM

## 2021-12-16 DIAGNOSIS — E119 Type 2 diabetes mellitus without complications: Secondary | ICD-10-CM

## 2021-12-16 DIAGNOSIS — M79675 Pain in left toe(s): Secondary | ICD-10-CM

## 2021-12-16 DIAGNOSIS — M25471 Effusion, right ankle: Secondary | ICD-10-CM | POA: Diagnosis not present

## 2021-12-16 DIAGNOSIS — M2042 Other hammer toe(s) (acquired), left foot: Secondary | ICD-10-CM

## 2021-12-16 DIAGNOSIS — M2041 Other hammer toe(s) (acquired), right foot: Secondary | ICD-10-CM

## 2021-12-16 NOTE — Patient Instructions (Signed)
Shoe and Sneaker Recommendations:  *Purchase shoes/sneakers with mesh (soft, stretchable) uppers. Avoid leather sneakers.*  1. Recommend Skechers Loafers with soft, stretchable uppers and memory foam insoles.  A. DSW in [facility] B. [employer]'s B. Macy's  C. Belk D. [email or web address].com E. Shoe Show F. [employer] in [place], [place]  2. New Balance Sneakers 600 Series or Higher (*Purchase sneakers with mesh (soft, stretchable) uppers. Avoid leather sneakers.*)  A. [email or web address] B. [employer] C. Fleet Feet,  D. [employer] E. [employer] Goods F. REI,  G. [employer] in [place]    3.  Brooks Beast: *Purchase sneakers with mesh (soft, stretchable) uppers. Avoid leather sneakers.* ( A. [employer] B. Fleet Feet,  C. [employer] D. [employer]s E. REI,  F. [employer] in [place]   Diabetes Mellitus and Foot Care Foot care is an important part of your health, especially when you have diabetes. Diabetes may cause you to have problems because of poor blood flow (circulation) to your feet and legs, which can cause your skin to: Become thinner and drier. Break more easily. Heal more slowly. Peel and crack. You may also have nerve damage (neuropathy) in your legs and feet, causing decreased feeling in them. This means that you may not notice minor injuries to your feet that could lead to more serious problems. Noticing and addressing any potential problems early is the best way to prevent future foot problems. How to care for your feet Foot hygiene  Wash your feet daily with warm water and mild soap. Do not use hot water. Then, pat your feet and the areas between your toes until they are completely dry. Do not soak your feet as this can dry your skin. Trim your toenails straight across. Do not dig under them or around the cuticle. File the edges of your nails with an emery board or nail  file. Apply a moisturizing lotion or petroleum jelly to the skin on your feet and to dry, brittle toenails. Use lotion that does not contain alcohol and is unscented. Do not apply lotion between your toes. Shoes and socks Wear clean socks or stockings every day. Make sure they are not too tight. Do not wear knee-high stockings since they may decrease blood flow to your legs. Wear shoes that fit properly and have enough cushioning. Always look in your shoes before you put them on to be sure there are no objects inside. To break in new shoes, wear them for just a few hours a day. This prevents injuries on your feet. Wounds, scrapes, corns, and calluses  Check your feet daily for blisters, cuts, bruises, sores, and redness. If you cannot see the bottom of your feet, use a mirror or ask someone for help. Do not cut corns or calluses or try to remove them with medicine. If you find a minor scrape, cut, or break in the skin on your feet, keep it and the skin around it clean and dry. You may clean these areas with mild soap and water. Do not clean the area with peroxide, alcohol, or iodine. If you have a wound, scrape, corn, or callus on your foot, look at it several times a day to make sure it is healing and not infected. Check for: Redness, swelling, or pain. Fluid or blood. Warmth. Pus or a bad smell. General tips Do not cross your legs. This may decrease blood flow to your feet. Do not use heating  pads or hot water bottles on your feet. They may burn your skin. If you have lost feeling in your feet or legs, you may not know this is happening until it is too late. Protect your feet from hot and cold by wearing shoes, such as at the beach or on hot pavement. Schedule a complete foot exam at least once a year (annually) or more often if you have foot problems. Report any cuts, sores, or bruises to your health care provider immediately. Where to find more information American Diabetes Association:  www.diabetes.org Association of Diabetes Care & Education Specialists: www.diabeteseducator.org Contact a health care provider if: You have a medical condition that increases your risk of infection and you have any cuts, sores, or bruises on your feet. You have an injury that is not healing. You have redness on your legs or feet. You feel burning or tingling in your legs or feet. You have pain or cramps in your legs and feet. Your legs or feet are numb. Your feet always feel cold. You have pain around any toenails. Get help right away if: You have a wound, scrape, corn, or callus on your foot and: You have pain, swelling, or redness that gets worse. You have fluid or blood coming from the wound, scrape, corn, or callus. Your wound, scrape, corn, or callus feels warm to the touch. You have pus or a bad smell coming from the wound, scrape, corn, or callus. You have a fever. You have a red line going up your leg. Summary Check your feet every day for blisters, cuts, bruises, sores, and redness. Apply a moisturizing lotion or petroleum jelly to the skin on your feet and to dry, brittle toenails. Wear shoes that fit properly and have enough cushioning. If you have foot problems, report any cuts, sores, or bruises to your health care provider immediately. Schedule a complete foot exam at least once a year (annually) or more often if you have foot problems. This information is not intended to replace advice given to you by your health care provider. Make sure you discuss any questions you have with your health care provider. Document Revised: 11/23/2019 Document Reviewed: 11/23/2019 Elsevier Patient Education  2023 [employer].

## 2021-12-21 NOTE — Progress Notes (Signed)
ANNUAL DIABETIC FOOT EXAM  Subjective: [name] Steele presents today for annual diabetic foot examination.  Patient confirms h/o diabetes.  Patient relates several year h/o diabetes.  Patient denies any h/o foot wounds.  Patient denies any numbness, tingling, burning, or pins/needle sensation in feet.  Risk factors: diabetes, HTN, CAD, CKD, h/o tobacco use in remission.  [name] Brunette, MD is patient's PCP. Last visit was December 05, 2021.  Past Medical History:  Diagnosis Date   Arthritis    Chronic kidney disease, stage 3a (HCC)    Chronic urinary tract infection    Coronary artery calcification seen on CT scan    Diabetes mellitus without complication (HCC)    Gout    Hypertension    ILD (interstitial lung disease) (HCC)    by CT 01/2021   Mild aortic stenosis    Mild dilation of ascending aorta [facility])    Osteoporosis    Patient Active Problem List   Diagnosis Date Noted   Coronary artery disease involving native coronary artery of native heart with angina pectoris (HCC) 04/14/2021   Heart murmur 04/14/2021   Nonspecific abnormal electrocardiogram (ECG) (EKG) 04/14/2021   Lower extremity edema 04/14/2021   Acute cholecystitis 07/04/2018   E coli bacteremia 04/11/2017   UTI (urinary tract infection) 04/10/2017   Sepsis (HCC) 04/10/2017   Type 2 diabetes mellitus without complication (HCC) 04/10/2017   Essential hypertension 04/10/2017   Past Surgical History:  Procedure Laterality Date   CHOLECYSTECTOMY N/A 07/04/2018   Procedure: LAPAROSCOPIC CHOLECYSTECTOMY WITH INTRAOPERATIVE CHOLANGIOGRAM;  Surgeon: Abigail Miyamoto, MD;  Location: [facility];  Service: General;  Laterality: N/A;   ERCP N/A 07/05/2018   Procedure: ENDOSCOPIC RETROGRADE CHOLANGIOPANCREATOGRAPHY (ERCP);  Surgeon: Vida Rigger, MD;  Location: [facility];  Service: Endoscopy;  Laterality: N/A;   JOINT REPLACEMENT Bilateral    REMOVAL OF STONES  07/05/2018   Procedure: REMOVAL OF STONES;  Surgeon: Vida Rigger, MD;  Location: [facility];  Service: Endoscopy;;   SPHINCTEROTOMY  07/05/2018   Procedure: Dennison Mascot;  Surgeon: Vida Rigger, MD;  Location: [facility];  Service: Endoscopy;;   Current Outpatient Medications on File Prior to Visit  Medication Sig Dispense Refill   Acetaminophen (TYLENOL PO) Take by mouth.     aspirin EC 81 MG tablet Take 1 tablet (81 mg total) by mouth daily. Swallow whole. 90 tablet 3   carvedilol (COREG) 6.25 MG tablet Take 1 tablet (6.25 mg total) by mouth 2 (two) times daily. 180 tablet 1   colchicine 0.6 MG tablet Take 0.6 mg by mouth daily as needed (gout).      CRANBERRY PO Take 1 capsule by mouth daily.     furosemide (LASIX) 20 MG tablet Take 1 tablet (20 mg total) by mouth daily as needed. 30 tablet 3   ipratropium (ATROVENT) 0.06 % nasal spray SMARTSIG:2 Spray(s) Both Nares 3 Times Daily PRN     metFORMIN (GLUCOPHAGE-XR) 750 MG 24 hr tablet Take 750 mg by mouth daily after breakfast.     rosuvastatin (CRESTOR) 10 MG tablet Take 1 tablet (10 mg total) by mouth daily. 90 tablet 3   telmisartan (MICARDIS) 80 MG tablet Take 80 mg by mouth daily.     No current facility-administered medications on file prior to visit.    Allergies  Allergen Reactions   Erythromycin Other (See Comments)    Severe Abdominal Pain   Penicillins Hives    Has patient had a PCN reaction causing immediate rash, facial/tongue/throat swelling, SOB or lightheadedness with  hypotension:  Yes  Has patient had a PCN reaction causing severe rash involving mucus membranes or skin necrosis:  No Has patient had a PCN reaction that required hospitalization: No Has patient had a PCN reaction occurring within the last 10 years: No If all of the above answers are "NO", then may proceed with Cephalosporin use. Tolerated CTX 11/23 admit    Social History   Occupational History   Not on file  Tobacco Use   Smoking status: Former    Packs/day: 1.00    Years: 60.00    Total pack years:  60.00    Types: Cigarettes    Quit date: 37    Years since quitting: 35.6   Smokeless tobacco: Never  Substance and Sexual Activity   Alcohol use: Yes    Alcohol/week: 0.0 standard drinks of alcohol    Comment: social   Drug use: No   Sexual activity: Not on file   Family History  Problem Relation Age of Onset   Heart disease Sister     There is no immunization history on file for this patient.   Review of Systems: Negative except as noted in the HPI.   Objective: There were no vitals filed for this visit.  [name] Steele is a pleasant 81 y.o. male in NAD. AAO X 3.  Vascular Examination: CFT <3 seconds b/l. DP pulses palpable b/l. PT pulses faintly palpable b/l. Digital hair absent. Skin temperature gradient warm to warm b/l. No ischemia or gangrene. No cyanosis or clubbing noted b/l. Nonpitting edema noted bilateral ankles.   Neurological Examination: Sensation grossly intact b/l with 10 gram monofilament. Vibratory sensation intact b/l.   Dermatological Examination: Pedal skin warm and supple b/l. Toenails 1-5 b/l thick, discolored, elongated with subungual debris and pain on dorsal palpation.  Hyperkeratotic lesion(s) submet head 5 b/l.  No erythema, no edema, no drainage, no fluctuance.  Musculoskeletal Examination: Muscle strength 5/5 to b/l LE. Hammertoe deformity noted 2-5 b/l. Plantar fat pad atrophy b/l lower extremities.  Radiographs: None  Footwear Assessment: Does the patient wear appropriate shoes? No. Does the patient need inserts/orthotics? Yes.  ADA Risk Categorization: Low Risk :  Patient has all of the following: Intact protective sensation No prior foot ulcer  No severe deformity Pedal pulses present  Assessment: 1. Pain due to onychomycosis of toenails of both feet   2. Ankle edema, bilateral   3. Acquired hammertoes of both feet   4. Controlled type 2 diabetes mellitus without complication, without long-term current use of insulin (HCC)    5. Encounter for diabetic foot exam [facility])     Plan: -Patient was evaluated and treated. All patient's and/or POA's questions/concerns answered on today's visit. -Diabetic foot examination performed today. -Continue foot and shoe inspections daily. Monitor blood glucose per PCP/Endocrinologist's recommendations. -Patient to continue soft, supportive shoe gear daily. -Mycotic toenails 1-5 bilaterally were debrided in length and girth with sterile nail nippers and dremel without incident. -Callus(es) submet head 5 b/l pared utilizing sterile scalpel blade without complication or incident. Total number debrided =2. -Dispensed list of shoe recommendations. -Patient/POA to call should there be question/concern in the interim. Return in about 3 months (around 03/18/2022).  Freddie Breech, DPM

## 2021-12-31 ENCOUNTER — Other Ambulatory Visit: Payer: Self-pay

## 2021-12-31 ENCOUNTER — Encounter (HOSPITAL_BASED_OUTPATIENT_CLINIC_OR_DEPARTMENT_OTHER): Payer: Self-pay | Admitting: Emergency Medicine

## 2021-12-31 ENCOUNTER — Emergency Department (HOSPITAL_BASED_OUTPATIENT_CLINIC_OR_DEPARTMENT_OTHER)
Admission: EM | Admit: 2021-12-31 | Discharge: 2021-12-31 | Discharge status: Against Medical Advice | Payer: Medicare Other | Attending: Emergency Medicine | Admitting: Emergency Medicine

## 2021-12-31 DIAGNOSIS — R1031 Right lower quadrant pain: Secondary | ICD-10-CM | POA: Diagnosis not present

## 2021-12-31 DIAGNOSIS — Z5321 Procedure and treatment not carried out due to patient leaving prior to being seen by health care provider: Secondary | ICD-10-CM | POA: Insufficient documentation

## 2021-12-31 LAB — URINALYSIS, MICROSCOPIC (REFLEX): WBC, UA: >50 WBC/hpf (ref 0–5)

## 2021-12-31 LAB — URINALYSIS, ROUTINE W REFLEX MICROSCOPIC
Bilirubin Urine: NEGATIVE
Glucose, UA: NEGATIVE mg/dL
Ketones, ur: NEGATIVE mg/dL
Nitrite: NEGATIVE
Protein, ur: NEGATIVE mg/dL
Specific Gravity, Urine: 1.015 (ref 1.005–1.030)
pH: 5.5 (ref 5.0–8.0)

## 2021-12-31 NOTE — ED Provider Notes (Signed)
Patient left without being seen  1. Patient left without being seen       Melene Plan, DO 12/31/21 1759

## 2021-12-31 NOTE — ED Triage Notes (Signed)
Pt reports RLQ pain since yesterday; denies NV or difficulty urinating

## 2022-01-21 NOTE — Progress Notes (Signed)
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    81yM with history of CKD, DM, CAD, mild AS referred for ILD incidentally noted on cardiac CT  He says he feels fine overall. He has some DOE. He attributes it to lack of activity. Doesn't think he has greater DOE than peers without lung disease. He did have some cough which was productive for a couple weeks about a month ago. No dysphagia, rashes, joint pain, ulcers in mouth or nose, familyh history of autoimmune disease.  Otherwise pertinent review of systems is negative.  No family history of lung disease  He worked as Education officer, environmental. No exposures to dusts/solvents/particulates without a mask at home or at work. No pets, did have a cat. He smoked for 60 py quit 1988. No MJ, vaping.    Past Medical History:  Diagnosis Date   Arthritis    Chronic kidney disease, stage 3a (HCC)    Chronic urinary tract infection    Coronary artery calcification seen on CT scan    Diabetes mellitus without complication (HCC)    Gout    Hypertension    ILD (interstitial lung disease) (HCC)    by CT 01/2021   Mild aortic stenosis    Mild dilation of ascending aorta (HCC)    Osteoporosis      Family History  Problem Relation Age of Onset   Heart disease Sister      Past Surgical History:  Procedure Laterality Date   CHOLECYSTECTOMY N/A 07/04/2018   Procedure: LAPAROSCOPIC CHOLECYSTECTOMY WITH INTRAOPERATIVE CHOLANGIOGRAM;  Surgeon: Abigail Miyamoto, MD;  Location: [facility];  Service: General;  Laterality: N/A;   ERCP N/A 07/05/2018   Procedure: ENDOSCOPIC RETROGRADE CHOLANGIOPANCREATOGRAPHY (ERCP);  Surgeon: Vida Rigger, MD;  Location: [facility];  Service: Endoscopy;  Laterality: N/A;   JOINT REPLACEMENT Bilateral    REMOVAL OF  STONES  07/05/2018   Procedure: REMOVAL OF STONES;  Surgeon: Vida Rigger, MD;  Location: [facility];  Service: Endoscopy;;   SPHINCTEROTOMY  07/05/2018   Procedure: Dennison Mascot;  Surgeon: Vida Rigger, MD;  Location: [facility];  Service: Endoscopy;;    Social History   Socioeconomic History   Marital status: Married    Spouse name: Not on file   Number of children: Not on file   Years of education: Not on file   Highest education level: Not on file  Occupational History   Not on file  Tobacco Use   Smoking status: Former    Packs/day: 1.00    Years: 31.00    Total pack years: 31.00    Types: Cigarettes    Quit date: 78    Years since quitting: 35.7   Smokeless tobacco: Never  Vaping Use   Vaping Use: Never used  Substance and Sexual Activity   Alcohol use: Yes    Alcohol/week: 0.0 standard drinks of alcohol    Comment: social   Drug use: No   Sexual activity: Not on file  Other Topics Concern   Not on file  Social History Narrative   Not on file   Social Determinants of Health   Financial Resource Strain: Not on file  Food Insecurity: Not on file  Transportation Needs: Not on file  Physical Activity: Not on file  Stress: Not on  file  Social Connections: Not on file  Intimate Partner Violence: Not on file     Allergies  Allergen Reactions   Erythromycin Other (See Comments)    Severe Abdominal Pain   Penicillins Hives    Has patient had a PCN reaction causing immediate rash, facial/tongue/throat swelling, SOB or lightheadedness with hypotension:  Yes  Has patient had a PCN reaction causing severe rash involving mucus membranes or skin necrosis:  No Has patient had a PCN reaction that required hospitalization: No Has patient had a PCN reaction occurring within the last 10 years: No If all of the above answers are "NO", then may proceed with Cephalosporin use. Tolerated CTX 11/23 admit      Outpatient Medications Prior to Visit  Medication Sig  Dispense Refill   aspirin EC 81 MG tablet Take 1 tablet (81 mg total) by mouth daily. Swallow whole. 90 tablet 3   carvedilol (COREG) 6.25 MG tablet Take 1 tablet (6.25 mg total) by mouth 2 (two) times daily. 180 tablet 1   colchicine 0.6 MG tablet Take 0.6 mg by mouth daily as needed (gout).      CRANBERRY PO Take 1 capsule by mouth daily.     furosemide (LASIX) 20 MG tablet Take 1 tablet (20 mg total) by mouth daily as needed. (Patient taking differently: Take 20 mg by mouth daily.) 30 tablet 3   ipratropium (ATROVENT) 0.06 % nasal spray SMARTSIG:2 Spray(s) Both Nares 3 Times Daily PRN     metFORMIN (GLUCOPHAGE-XR) 750 MG 24 hr tablet Take 750 mg by mouth daily after breakfast.     rosuvastatin (CRESTOR) 10 MG tablet Take 1 tablet (10 mg total) by mouth daily. 90 tablet 3   telmisartan (MICARDIS) 80 MG tablet Take 80 mg by mouth daily.     Acetaminophen (TYLENOL PO) Take by mouth.     No facility-administered medications prior to visit.       Objective:   Physical Exam:  General appearance: 81 y.o., male, NAD, conversant  Eyes: anicteric sclerae; PERRL, tracking appropriately HENT: NCAT; MMM Neck: Trachea midline; no lymphadenopathy, no JVD Lungs: CTAB, no crackles, no wheeze, with normal respiratory effort CV: RRR, no murmur  Abdomen: Soft, non-tender; non-distended, BS present  Extremities: No peripheral edema, warm Skin: Normal turgor and texture; no rash Psych: Appropriate affect Neuro: Alert and oriented to person and place, no focal deficit     Vitals:   01/22/22 1348  BP: 124/84  Pulse: 61  Temp: 98.4 F (36.9 C)  TempSrc: Oral  SpO2: 97%  Weight: 156 lb (70.8 kg)  Height: 5\' 10"  (1.778 m)   97% on RA BMI Readings from Last 3 Encounters:  01/22/22 22.38 kg/m  12/31/21 22.53 kg/m  12/08/21 22.53 kg/m   Wt Readings from Last 3 Encounters:  01/22/22 156 lb (70.8 kg)  12/31/21 157 lb (71.2 kg)  12/08/21 157 lb (71.2 kg)     CBC    Component Value  Date/Time   WBC 5.5 10/14/2021 1425   WBC 8.7 07/06/2018 0548   RBC 4.11 (L) 10/14/2021 1425   RBC 3.81 (L) 07/06/2018 0548   HGB 12.9 (L) 10/14/2021 1425   HCT 38.9 10/14/2021 1425   PLT 231 10/14/2021 1425   MCV 95 10/14/2021 1425   MCH 31.4 10/14/2021 1425   MCH 31.5 07/06/2018 0548   MCHC 33.2 10/14/2021 1425   MCHC 31.2 07/06/2018 0548   RDW 12.8 10/14/2021 1425   LYMPHSABS 0.3 (L) 07/03/2018 2347  MONOABS 0.7 07/03/2018 2347   EOSABS 0.0 07/03/2018 2347   BASOSABS 0.0 07/03/2018 2347    Eos 0-100  Chest Imaging: Cardiac CT 02/11/21 reviewed by me with minor extent but progressive lower lobe predominant subpleural reticulation (relative to 2019)  Pulmonary Functions Testing Results:     No data to display           Echocardiogram:   TTE 04/2021: 1. Left ventricular ejection fraction, by estimation, is 60 to 65%. The  left ventricle has normal function. The left ventricle has no regional  wall motion abnormalities. Left ventricular diastolic parameters are  indeterminate.   2. Right ventricular systolic function is normal. The right ventricular  size is normal. There is normal pulmonary artery systolic pressure.   3. Left atrial size was mildly dilated.   4. The mitral valve is degenerative. Trivial mitral valve regurgitation.  No evidence of mitral stenosis.   5. The aortic valve is tricuspid. There is moderate calcification of the  aortic valve. There is mild thickening of the aortic valve. Aortic valve  regurgitation is not visualized. Mild aortic valve stenosis.   6. Aortic dilatation noted. There is borderline dilatation of the  ascending aorta, measuring 38 mm.   7. The inferior vena cava is normal in size with <50% respiratory  variability, suggesting right atrial pressure of 8 mmHg.       Assessment & Plan:   # ILD  Probably progressive though limited by comparison of CTA to dedicated CT Chest. His DOE is only mild and apart from 2 weeks of  productive cough (which has since resolved), he is minimally symptomatic.  Plan: - HRCT Chest, PFTs and clinic visit in 8 weeks - we'll decide based on symptoms, HRCT, PFT whether to order CTD serologies, consider bronch, antifibrotics although with his limited symptom burden I think surveillance is more likely ultimately     Omar Person, MD [facility] 01/22/2022 2:03 PM

## 2022-01-22 ENCOUNTER — Encounter: Payer: Self-pay | Admitting: Student

## 2022-01-22 ENCOUNTER — Ambulatory Visit (INDEPENDENT_AMBULATORY_CARE_PROVIDER_SITE_OTHER): Payer: Medicare Other | Admitting: Student

## 2022-01-22 VITALS — BP 124/84 | HR 61 | Temp 98.4°F | Ht 70.0 in | Wt 156.0 lb

## 2022-01-22 DIAGNOSIS — J849 Interstitial pulmonary disease, unspecified: Secondary | ICD-10-CM | POA: Diagnosis not present

## 2022-01-22 NOTE — Patient Instructions (Addendum)
-   we will schedule CT Chest and PFTs and see you in clinic in 8 weeks to discuss - be on the lookout for trouble swallowing, rashes, joint pain, ulcers in mouth or nose - these could be signs of an associated autoimmune disease (we can consider testing next visit if you like)

## 2022-02-03 ENCOUNTER — Emergency Department (HOSPITAL_BASED_OUTPATIENT_CLINIC_OR_DEPARTMENT_OTHER)
Admission: EM | Admit: 2022-02-03 | Discharge: 2022-02-04 | Disposition: A | Discharge status: Home / Self Care | Payer: Medicare Other | Attending: Emergency Medicine | Admitting: Emergency Medicine

## 2022-02-03 ENCOUNTER — Encounter (HOSPITAL_BASED_OUTPATIENT_CLINIC_OR_DEPARTMENT_OTHER): Payer: Self-pay | Admitting: Emergency Medicine

## 2022-02-03 ENCOUNTER — Emergency Department (HOSPITAL_BASED_OUTPATIENT_CLINIC_OR_DEPARTMENT_OTHER): Payer: Medicare Other

## 2022-02-03 ENCOUNTER — Other Ambulatory Visit: Payer: Self-pay

## 2022-02-03 ENCOUNTER — Encounter (HOSPITAL_COMMUNITY): Payer: Self-pay

## 2022-02-03 DIAGNOSIS — N281 Cyst of kidney, acquired: Secondary | ICD-10-CM | POA: Diagnosis not present

## 2022-02-03 DIAGNOSIS — E119 Type 2 diabetes mellitus without complications: Secondary | ICD-10-CM | POA: Insufficient documentation

## 2022-02-03 DIAGNOSIS — Z7982 Long term (current) use of aspirin: Secondary | ICD-10-CM | POA: Insufficient documentation

## 2022-02-03 DIAGNOSIS — N12 Tubulo-interstitial nephritis, not specified as acute or chronic: Secondary | ICD-10-CM | POA: Insufficient documentation

## 2022-02-03 DIAGNOSIS — Z79899 Other long term (current) drug therapy: Secondary | ICD-10-CM | POA: Insufficient documentation

## 2022-02-03 DIAGNOSIS — Z7984 Long term (current) use of oral hypoglycemic drugs: Secondary | ICD-10-CM | POA: Insufficient documentation

## 2022-02-03 DIAGNOSIS — I714 Abdominal aortic aneurysm, without rupture, unspecified: Secondary | ICD-10-CM | POA: Insufficient documentation

## 2022-02-03 DIAGNOSIS — R1031 Right lower quadrant pain: Secondary | ICD-10-CM | POA: Diagnosis not present

## 2022-02-03 DIAGNOSIS — I10 Essential (primary) hypertension: Secondary | ICD-10-CM | POA: Diagnosis not present

## 2022-02-03 LAB — URINALYSIS, MICROSCOPIC (REFLEX): WBC, UA: >50 WBC/hpf (ref 0–5)

## 2022-02-03 LAB — CBC WITH DIFFERENTIAL/PLATELET
Abs Immature Granulocytes: 0.02 10*3/uL (ref 0.00–0.07)
Basophils Absolute: 0 10*3/uL (ref 0.0–0.1)
Basophils Relative: 0 %
Eosinophils Absolute: 0 10*3/uL (ref 0.0–0.5)
Eosinophils Relative: 0 %
HCT: 39.5 % (ref 39.0–52.0)
Hemoglobin: 12.5 g/dL — ABNORMAL LOW (ref 13.0–17.0)
Immature Granulocytes: 0 %
Lymphocytes Relative: 8 %
Lymphs Abs: 0.6 10*3/uL — ABNORMAL LOW (ref 0.7–4.0)
MCH: 31.1 pg (ref 26.0–34.0)
MCHC: 31.6 g/dL (ref 30.0–36.0)
MCV: 98.3 fL (ref 80.0–100.0)
Monocytes Absolute: 0.8 10*3/uL (ref 0.1–1.0)
Monocytes Relative: 11 %
Neutro Abs: 5.8 10*3/uL (ref 1.7–7.7)
Neutrophils Relative %: 81 %
Platelets: 168 10*3/uL (ref 150–400)
RBC: 4.02 MIL/uL — ABNORMAL LOW (ref 4.22–5.81)
RDW: 13.9 % (ref 11.5–15.5)
WBC: 7.2 10*3/uL (ref 4.0–10.5)
nRBC: 0 % (ref 0.0–0.2)

## 2022-02-03 LAB — COMPREHENSIVE METABOLIC PANEL
ALT: 15 U/L (ref 0–44)
AST: 26 U/L (ref 15–41)
Albumin: 3.4 g/dL — ABNORMAL LOW (ref 3.5–5.0)
Alkaline Phosphatase: 58 U/L (ref 38–126)
Anion gap: 9 (ref 5–15)
BUN: 13 mg/dL (ref 8–23)
CO2: 28 mmol/L (ref 22–32)
Calcium: 9 mg/dL (ref 8.9–10.3)
Chloride: 100 mmol/L (ref 98–111)
Creatinine, Ser: 0.95 mg/dL (ref 0.61–1.24)
GFR, Estimated: >60 mL/min (ref >60)
Glucose, Bld: 165 mg/dL — ABNORMAL HIGH (ref 70–99)
Potassium: 3.6 mmol/L (ref 3.5–5.1)
Sodium: 137 mmol/L (ref 135–145)
Total Bilirubin: 0.9 mg/dL (ref 0.3–1.2)
Total Protein: 6.5 g/dL (ref 6.5–8.1)

## 2022-02-03 LAB — URINALYSIS, ROUTINE W REFLEX MICROSCOPIC
Bilirubin Urine: NEGATIVE
Glucose, UA: NEGATIVE mg/dL
Ketones, ur: NEGATIVE mg/dL
Nitrite: POSITIVE — AB
Protein, ur: 100 mg/dL — AB
Specific Gravity, Urine: 1.015 (ref 1.005–1.030)
pH: 6 (ref 5.0–8.0)

## 2022-02-03 LAB — LIPASE, BLOOD: Lipase: 29 U/L (ref 11–51)

## 2022-02-03 LAB — LACTIC ACID, PLASMA: Lactic Acid, Venous: 1.5 mmol/L (ref 0.5–1.9)

## 2022-02-03 MED ORDER — ACETAMINOPHEN 325 MG PO TABS
650.0000 mg | ORAL_TABLET | Freq: Once | ORAL | Status: AC
Start: 2022-02-03 — End: 2022-02-03
  Administered 2022-02-03: 650 mg via ORAL
  Filled 2022-02-03: qty 2

## 2022-02-03 MED ORDER — SODIUM CHLORIDE 0.9 % IV SOLN
INTRAVENOUS | Status: AC
  Filled 2022-02-03: qty 10

## 2022-02-03 MED ORDER — IOHEXOL 300 MG/ML  SOLN
100.0000 mL | Freq: Once | INTRAMUSCULAR | Status: AC | PRN
  Administered 2022-02-03: 100 mL via INTRAVENOUS

## 2022-02-03 MED ORDER — SODIUM CHLORIDE 0.9 % IV SOLN
1.0000 g | Freq: Once | INTRAVENOUS | Status: AC
  Administered 2022-02-03: 1 g via INTRAVENOUS

## 2022-02-03 MED ORDER — CEFADROXIL 500 MG PO CAPS: 1000.0000 mg | ORAL_CAPSULE | Freq: Two times a day (BID) | ORAL | 0 refills | Status: AC

## 2022-02-03 MED ORDER — CEFTRIAXONE SODIUM 1 G IJ SOLR
INTRAMUSCULAR | Status: AC
  Filled 2022-02-03: qty 10

## 2022-02-03 NOTE — ED Triage Notes (Signed)
Constant RLQ abdominal pain that started today. Describes pain as a "strain"

## 2022-02-03 NOTE — ED Provider Notes (Signed)
[facility]PART[facility] Provider Note   CSN: [id number] Arrival date & time: 02/03/22  1810     History  Chief Complaint  Patient presents with  . Abdominal Pain    Zakariyah Freimark is a 81 y.o. male.  The history is provided by the patient and medical records. No language interpreter was used.  Abdominal Pain Pain location:  R flank, RLQ and suprapubic Pain quality: aching   Pain radiates to:  R flank Pain severity:  Moderate Onset quality:  Gradual Duration:  2 days Timing:  Constant Progression:  Unchanged Chronicity:  New Context: not trauma   Relieved by:  Nothing Worsened by:  Nothing Ineffective treatments:  None tried Associated symptoms: chills and fever   Associated symptoms: no chest pain, no constipation, no cough, no diarrhea, no dysuria, no fatigue, no nausea, no shortness of breath and no vomiting        Home Medications Prior to Admission medications   Medication Sig Start Date End Date Taking? Authorizing Provider  aspirin EC 81 MG tablet Take 1 tablet (81 mg total) by mouth daily. Swallow whole. 04/14/21   Jerline Pain, MD  carvedilol (COREG) 6.25 MG tablet Take 1 tablet (6.25 mg total) by mouth 2 (two) times daily. 11/07/21   Dunn, Nedra Hai, PA-C  colchicine 0.6 MG tablet Take 0.6 mg by mouth daily as needed (gout).     [provider]  CRANBERRY PO Take 1 capsule by mouth daily.    [provider]  furosemide (LASIX) 20 MG tablet Take 1 tablet (20 mg total) by mouth daily as needed. Patient taking differently: Take 20 mg by mouth daily. 12/08/21   Dunn, Nedra Hai, PA-C  ipratropium (ATROVENT) 0.06 % nasal spray SMARTSIG:2 Spray(s) Both Nares 3 Times Daily PRN 12/05/21   [provider]  metFORMIN (GLUCOPHAGE-XR) 750 MG 24 hr tablet Take 750 mg by mouth daily after breakfast. 06/05/18   [provider]  rosuvastatin (CRESTOR) 10 MG tablet Take 1 tablet (10 mg total) by mouth daily. 04/14/21   Jerline Pain, MD  telmisartan (MICARDIS) 80 MG tablet Take 80 mg by mouth daily.    [provider]      Allergies    Erythromycin, Penicillin g, and Penicillins    Review of Systems   Review of Systems  Constitutional:  Positive for chills and fever. Negative for fatigue.  HENT:  Negative for congestion.   Eyes:  Negative for visual disturbance.  Respiratory:  Negative for cough, chest tightness and shortness of breath.   Cardiovascular:  Negative for chest pain, palpitations and leg swelling.  Gastrointestinal:  Positive for abdominal pain. Negative for constipation, diarrhea, nausea and vomiting.  Genitourinary:  Positive for decreased urine volume and flank pain. Negative for dysuria.  Musculoskeletal:  Negative for back pain, neck pain and neck stiffness.  Skin:  Negative for rash and wound.  Neurological:  Negative for weakness, light-headedness and headaches.  Psychiatric/Behavioral:  Negative for agitation.   All other systems reviewed and are negative.   Physical Exam Updated Vital Signs BP (!) 147/85   Pulse 79   Temp 100.2 F (37.9 C) (Oral)   Resp 18   Ht 5\' 10"  (1.778 m)   Wt 71.2 kg   SpO2 95%   BMI 22.53 kg/m  Physical Exam Vitals and nursing note reviewed.  Constitutional:      General: He is not in acute distress.    Appearance: He is well-developed.  He is not ill-appearing, toxic-appearing or diaphoretic.  HENT:     Head: Normocephalic and atraumatic.  Eyes:     Conjunctiva/sclera: Conjunctivae normal.  Cardiovascular:     Rate and Rhythm: Normal rate and regular rhythm.     Heart sounds: No murmur heard. Pulmonary:     Effort: Pulmonary effort is normal. No respiratory distress.     Breath sounds: Normal breath sounds. No wheezing, rhonchi or rales.  Chest:     Chest wall: No tenderness.  Abdominal:     Palpations: Abdomen is soft.     Tenderness: There is abdominal tenderness in the right lower quadrant and suprapubic area. There is right  CVA tenderness. There is no left CVA tenderness, guarding or rebound.    Musculoskeletal:        General: No swelling.     Cervical back: Neck supple.     Comments: Patient had palpable pulses distally in both legs.  Intact sensation and strength as well.  Very warm to the touch diffusely.  Skin:    General: Skin is warm and dry.     Capillary Refill: Capillary refill takes less than 2 seconds.  Neurological:     Mental Status: He is alert.  Psychiatric:        Mood and Affect: Mood normal.    ED Results / Procedures / Treatments   Labs (all labs ordered are listed, but only abnormal results are displayed) Labs Reviewed  COMPREHENSIVE METABOLIC PANEL - Abnormal; Notable for the following components:      Result Value   Glucose, Bld 165 (*)    Albumin 3.4 (*)    All other components within normal limits  URINALYSIS, ROUTINE W REFLEX MICROSCOPIC - Abnormal; Notable for the following components:   APPearance HAZY (*)    Hgb urine dipstick MODERATE (*)    Protein, ur 100 (*)    Nitrite POSITIVE (*)    Leukocytes,Ua LARGE (*)    All other components within normal limits  CBC WITH DIFFERENTIAL/PLATELET - Abnormal; Notable for the following components:   RBC 4.02 (*)    Hemoglobin 12.5 (*)    Lymphs Abs 0.6 (*)    All other components within normal limits  URINALYSIS, MICROSCOPIC (REFLEX) - Abnormal; Notable for the following components:   Bacteria, UA MANY (*)    All other components within normal limits  URINE CULTURE  CULTURE, BLOOD (ROUTINE X 2)  CULTURE, BLOOD (ROUTINE X 2)  LIPASE, BLOOD  LACTIC ACID, PLASMA  LACTIC ACID, PLASMA    EKG None  Radiology CT ABDOMEN PELVIS W CONTRAST  Result Date: 02/03/2022 CLINICAL DATA:  Right lower quadrant pain. EXAM: CT ABDOMEN AND PELVIS WITH CONTRAST TECHNIQUE: Multidetector CT imaging of the abdomen and pelvis was performed using the standard protocol following bolus administration of intravenous contrast. RADIATION DOSE  REDUCTION: This exam was performed according to the departmental dose-optimization program which includes automated exposure control, adjustment of the mA and/or kV according to patient size and/or use of iterative reconstruction technique. CONTRAST:  OMNIPAQUE IOHEXOL 300 MG/ML  SOLN COMPARISON:  Oct 06, 2008 FINDINGS: Lower chest: No acute abnormality. Hepatobiliary: No focal liver abnormality is seen. Status post cholecystectomy. Air is seen throughout the common bile duct with a mild amount of pneumobilia noted within the left lobe of the liver. Pancreas: Unremarkable. No pancreatic ductal dilatation or surrounding inflammatory changes. Spleen: Normal in size without focal abnormality. Adrenals/Urinary Tract: Diffuse enlargement of the lateral limb of the left  adrenal gland is noted. The kidneys are mildly atrophic in size and lobulated in appearance. Multiple bilateral simple renal cysts are noted. There is no evidence of renal calculi or hydronephrosis. A 1.5 cm x 0.9 cm posterolateral urinary bladder diverticulum is seen on the right. Very mild diffuse urinary bladder wall thickening is seen. Stomach/Bowel: Stomach is within normal limits. Appendix appears normal. No evidence of bowel wall thickening, distention, or inflammatory changes. Vascular/Lymphatic: Marked severity aortic atherosclerosis with 3.8 cm x 3.6 cm aneurysmal dilatation of the infrarenal abdominal aorta and 2.0 cm diameter dilatation of the right common iliac artery. No enlarged abdominal or pelvic lymph nodes. Reproductive: The prostate gland is mildly enlarged with marked severity diffuse prostate gland calcification. Other: No abdominal wall hernia or abnormality. No abdominopelvic ascites. Musculoskeletal: Multilevel degenerative changes are seen throughout the lumbar spine. IMPRESSION: 1. Urinary bladder wall thickening which may represent sequelae associated with mild cystitis. Correlation with urinalysis is recommended. 2.  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 Recommend follow-up ultrasound every 3 years. This recommendation follows ACR consensus guidelines: White Paper of the ACR Incidental Findings Committee II on Vascular Findings. J Am Coll Radiol 2013; 10:789-794. 3. Multiple bilateral simple renal cysts (Bosniak 1). No additional follow-up or imaging is recommended. This recommendation follows ACR consensus guidelines: Management of the Incidental Renal Mass on CT: A White Paper of the ACR Incidental Findings Committee. J Am Coll Radiol 2018;15:264-273. 4. Status post cholecystectomy with associated pneumobilia. 5. Enlarged prostate gland with marked severity diffuse prostate gland calcification. Aortic Atherosclerosis (ICD10-I70.0). Electronically Signed   By: Aram Candelahaddeus  Houston M.D.   On: 02/03/2022 22:05    Procedures Procedures    Medications Ordered in ED Medications  cefTRIAXone (ROCEPHIN) 1 g injection (has no administration in time range)  sodium chloride 0.9 % with cefTRIAXone (ROCEPHIN) ADS Med (has no administration in time range)  iohexol (OMNIPAQUE) 300 MG/ML solution 100 mL (100 mLs Intravenous Contrast Given 02/03/22 2134)  acetaminophen (TYLENOL) tablet 650 mg (650 mg Oral Given 02/03/22 2155)  cefTRIAXone (ROCEPHIN) 1 g in sodium chloride 0.9 % 100 mL IVPB (1 g Intravenous New Bag/Given 02/03/22 2319)    ED Course/ Medical Decision Making/ A&P                           Medical Decision Making Amount and/or Complexity of Data Reviewed Labs: ordered. Radiology: ordered.  Risk OTC drugs. Prescription drug management. Decision regarding hospitalization.    Charlena Crossathaniel Burkle is a 81 y.o. male with a past medical history significant for diabetes, hypertension, aortic stenosis and dilation, gout, interstitial lung disease, previous cholecystectomy, and  previous UTI with sepsis who presents with darkened urine, suprapubic pain, and pain rating in the right flank.  According to patient, for the last few days he has had some darkened urine and has had pain developing from his suprapubic area around towards his right flank and right back.  He reports some chills but until arrival had not complained of fevers.  Denied nausea or vomiting, constipation, or diarrhea.  Denies any chest pain or shortness of breath.  Denies any other anterior abdominal pain.  Denies this being said and this was more gradual.  Reports feels similar to when he had UTI in the past.  Denies any leg symptoms and denies any leg pain or leg swelling  being new.  On exam, lungs clear and chest nontender.  Abdomen was tender primarily in the right flank, right CVA, and suprapubic area.  Intact sensation, strength, and pulses distally.  Bowel sounds were appreciated.  Given the patient's history of UTI and sepsis I am concerned as the patient was febrile and very warm to touch.  Rectal temp was over 102.  He was given Tylenol with improvement.  Patient had screening work-up including a CT abdomen pelvis due to the right sided abdominal pain to rule out pyelonephritis, kidney stone, right-sided diverticulitis, appendicitis, or other abnormality.  CT scan does show some bladder wall thickening and his urinalysis is convincing for urinary tract infection.  Given the pain going towards his right flank and right back I do suspect this could be early pyelonephritis.  CT scan also incidentally showed evidence of aneurysmal dilation which radiology recommended repeat work-up in 3 years.  He had intact pulses distally and given the location discomfort have less suspicion that this is an acute aortic etiology at this time.  We will give instructions for outpatient follow-up with vascular surgery.  Initially plan was going to admit for developing pyelonephritis in this febrile 81 year old with a history  of sepsis and severe disease however he does not want to be admitted.  Chart review shows that he has tolerated cephalosporins so we will give a dose of Rocephin initially and then sent home with antibiotics.  I spoke to pharmacy who recommended cefadroxil which we will print the prescription for.  Patient and family understand to follow-up with PCP in the next day or 2 and if any symptoms are to change or worsen, he needs to return to the nearest emergency department for further management and admission.  He was also given instructions on follow-up with vascular surgery given his aortic and vascular disease progression.  Patient family had no other questions or concerns and patient discharged in good condition with understanding return precautions.             Final Clinical Impression(s) / ED Diagnoses Final diagnoses:  Pyelonephritis  Abdominal aortic aneurysm (AAA) without rupture, unspecified part (HCC)    Rx / DC Orders ED Discharge Orders          Ordered    cefadroxil (DURICEF) 500 MG capsule  2 times daily        02/03/22 2300            Clinical Impression: 1. Pyelonephritis   2. Abdominal aortic aneurysm (AAA) without rupture, unspecified part (HCC)     Disposition: Discharge  Condition: Good  I have discussed the results, Dx and Tx plan with the pt(& family if present). He/she/they expressed understanding and agree(s) with the plan. Discharge instructions discussed at great length. Strict return precautions discussed and pt &/or family have verbalized understanding of the instructions. No further questions at time of discharge.    New Prescriptions   CEFADROXIL (DURICEF) 500 MG CAPSULE    Take 2 capsules (1,000 mg total) by mouth 2 (two) times daily for 10 days.    Follow Up: [name], MD [address] [place] [address] [phone]     Chuck Hint, MD [address] [place] [address] [phone]   For the  aortic aneurysm as we discussed     Holland Kotter, Canary Brim, MD 02/03/22 [phone]

## 2022-02-03 NOTE — Discharge Instructions (Signed)
Your history, exam, work-up today revealed evidence of recurrent urinary tract infection that given your location of discomfort going towards her flank is suggestive of developing pyelonephritis or kidney infection.  Given your history of sepsis from UTI, we recommended admission initially however given your normal white blood cell count, normal lactic acid, and otherwise improvement in symptoms, we do feel to reasonable to attempt initial outpatient antibiotic management.  We gave you a dose of IV antibiotics and I spoke to pharmacy who recommended the oral regimen of antibiotics to take for the next 10 days.  Please rest and stay hydrated.  Please follow-up with your primary doctor in the next day or 2.  If any symptoms change or worsen acutely, please return to the nearest emergency department.  We also incidentally saw some aneurysmal dilation in the arteries of your abdomen called and aortic aneurysm.  Based on your description of symptoms and fever today I do suspect that your acute symptoms are from the infection and not from the aneurysm however if symptoms are to change or worsen acutely, please return to the nearest emergency department.  Please also follow-up with outpatient vascular surgery for the aneurysm.

## 2022-02-04 DIAGNOSIS — I714 Abdominal aortic aneurysm, without rupture, unspecified: Secondary | ICD-10-CM | POA: Diagnosis not present

## 2022-02-04 LAB — BLOOD CULTURE ID PANEL (REFLEXED) - BCID2

## 2022-02-05 ENCOUNTER — Telehealth (HOSPITAL_BASED_OUTPATIENT_CLINIC_OR_DEPARTMENT_OTHER): Payer: Self-pay | Admitting: Emergency Medicine

## 2022-02-05 LAB — BLOOD CULTURE ID PANEL (REFLEXED) - BCID2

## 2022-02-06 ENCOUNTER — Other Ambulatory Visit: Payer: Self-pay

## 2022-02-06 ENCOUNTER — Emergency Department (HOSPITAL_COMMUNITY): Payer: Medicare Other

## 2022-02-06 ENCOUNTER — Observation Stay (HOSPITAL_COMMUNITY)
Admission: EM | Admit: 2022-02-06 | Discharge: 2022-02-07 | Disposition: A | Discharge status: Home / Self Care | Payer: Medicare Other | Attending: Internal Medicine | Admitting: Internal Medicine

## 2022-02-06 ENCOUNTER — Encounter (HOSPITAL_COMMUNITY): Payer: Self-pay

## 2022-02-06 DIAGNOSIS — M6281 Muscle weakness (generalized): Secondary | ICD-10-CM | POA: Diagnosis not present

## 2022-02-06 DIAGNOSIS — R1031 Right lower quadrant pain: Secondary | ICD-10-CM | POA: Diagnosis present

## 2022-02-06 DIAGNOSIS — R2689 Other abnormalities of gait and mobility: Secondary | ICD-10-CM | POA: Diagnosis not present

## 2022-02-06 DIAGNOSIS — I25119 Atherosclerotic heart disease of native coronary artery with unspecified angina pectoris: Secondary | ICD-10-CM | POA: Insufficient documentation

## 2022-02-06 DIAGNOSIS — Z79899 Other long term (current) drug therapy: Secondary | ICD-10-CM | POA: Insufficient documentation

## 2022-02-06 DIAGNOSIS — I7143 Infrarenal abdominal aortic aneurysm, without rupture: Secondary | ICD-10-CM | POA: Diagnosis present

## 2022-02-06 DIAGNOSIS — E1122 Type 2 diabetes mellitus with diabetic chronic kidney disease: Secondary | ICD-10-CM | POA: Insufficient documentation

## 2022-02-06 DIAGNOSIS — E118 Type 2 diabetes mellitus with unspecified complications: Secondary | ICD-10-CM

## 2022-02-06 DIAGNOSIS — Z87891 Personal history of nicotine dependence: Secondary | ICD-10-CM | POA: Insufficient documentation

## 2022-02-06 DIAGNOSIS — B9620 Unspecified Escherichia coli [E. coli] as the cause of diseases classified elsewhere: Secondary | ICD-10-CM | POA: Diagnosis not present

## 2022-02-06 DIAGNOSIS — Z7982 Long term (current) use of aspirin: Secondary | ICD-10-CM | POA: Diagnosis not present

## 2022-02-06 DIAGNOSIS — I959 Hypotension, unspecified: Secondary | ICD-10-CM | POA: Insufficient documentation

## 2022-02-06 DIAGNOSIS — I129 Hypertensive chronic kidney disease with stage 1 through stage 4 chronic kidney disease, or unspecified chronic kidney disease: Secondary | ICD-10-CM | POA: Diagnosis not present

## 2022-02-06 DIAGNOSIS — E119 Type 2 diabetes mellitus without complications: Secondary | ICD-10-CM

## 2022-02-06 DIAGNOSIS — I10 Essential (primary) hypertension: Secondary | ICD-10-CM | POA: Diagnosis present

## 2022-02-06 DIAGNOSIS — I714 Abdominal aortic aneurysm, without rupture, unspecified: Secondary | ICD-10-CM | POA: Insufficient documentation

## 2022-02-06 DIAGNOSIS — D649 Anemia, unspecified: Secondary | ICD-10-CM | POA: Diagnosis not present

## 2022-02-06 DIAGNOSIS — E876 Hypokalemia: Secondary | ICD-10-CM | POA: Diagnosis not present

## 2022-02-06 DIAGNOSIS — Z7984 Long term (current) use of oral hypoglycemic drugs: Secondary | ICD-10-CM | POA: Insufficient documentation

## 2022-02-06 DIAGNOSIS — R55 Syncope and collapse: Principal | ICD-10-CM | POA: Insufficient documentation

## 2022-02-06 DIAGNOSIS — I2510 Atherosclerotic heart disease of native coronary artery without angina pectoris: Secondary | ICD-10-CM | POA: Diagnosis present

## 2022-02-06 DIAGNOSIS — N1831 Chronic kidney disease, stage 3a: Secondary | ICD-10-CM | POA: Insufficient documentation

## 2022-02-06 LAB — COMPREHENSIVE METABOLIC PANEL
ALT: 18 U/L (ref 0–44)
AST: 26 U/L (ref 15–41)
Albumin: 3.3 g/dL — ABNORMAL LOW (ref 3.5–5.0)
Alkaline Phosphatase: 52 U/L (ref 38–126)
Anion gap: 9 (ref 5–15)
BUN: 10 mg/dL (ref 8–23)
CO2: 28 mmol/L (ref 22–32)
Calcium: 9 mg/dL (ref 8.9–10.3)
Chloride: 105 mmol/L (ref 98–111)
Creatinine, Ser: 1 mg/dL (ref 0.61–1.24)
GFR, Estimated: >60 mL/min (ref >60)
Glucose, Bld: 114 mg/dL — ABNORMAL HIGH (ref 70–99)
Potassium: 3.4 mmol/L — ABNORMAL LOW (ref 3.5–5.1)
Sodium: 142 mmol/L (ref 135–145)
Total Bilirubin: 0.6 mg/dL (ref 0.3–1.2)
Total Protein: 6.5 g/dL (ref 6.5–8.1)

## 2022-02-06 LAB — URINALYSIS, ROUTINE W REFLEX MICROSCOPIC
Bacteria, UA: NONE SEEN
Bilirubin Urine: NEGATIVE
Glucose, UA: NEGATIVE mg/dL
Hgb urine dipstick: NEGATIVE
Ketones, ur: NEGATIVE mg/dL
Nitrite: NEGATIVE
Protein, ur: NEGATIVE mg/dL
Specific Gravity, Urine: 1.004 — ABNORMAL LOW (ref 1.005–1.030)
pH: 7 (ref 5.0–8.0)

## 2022-02-06 LAB — CBC
HCT: 39.7 % (ref 39.0–52.0)
Hemoglobin: 12.7 g/dL — ABNORMAL LOW (ref 13.0–17.0)
MCH: 31.6 pg (ref 26.0–34.0)
MCHC: 32 g/dL (ref 30.0–36.0)
MCV: 98.8 fL (ref 80.0–100.0)
Platelets: 226 10*3/uL (ref 150–400)
RBC: 4.02 MIL/uL — ABNORMAL LOW (ref 4.22–5.81)
RDW: 13.8 % (ref 11.5–15.5)
WBC: 5.6 10*3/uL (ref 4.0–10.5)
nRBC: 0 % (ref 0.0–0.2)

## 2022-02-06 LAB — CULTURE, BLOOD (ROUTINE X 2): Special Requests: ADEQUATE

## 2022-02-06 LAB — LACTIC ACID, PLASMA: Lactic Acid, Venous: 1.5 mmol/L (ref 0.5–1.9)

## 2022-02-06 LAB — CBG MONITORING, ED: Glucose-Capillary: 95 mg/dL (ref 70–99)

## 2022-02-06 LAB — URINE CULTURE: Culture: >=100000 — AB

## 2022-02-06 LAB — LIPASE, BLOOD: Lipase: 45 U/L (ref 11–51)

## 2022-02-06 MED ORDER — ASPIRIN 81 MG PO TBEC
81.0000 mg | DELAYED_RELEASE_TABLET | Freq: Every day | ORAL | Status: DC
  Administered 2022-02-07: 81 mg via ORAL
  Filled 2022-02-06: qty 1

## 2022-02-06 MED ORDER — ACETAMINOPHEN 325 MG PO TABS: 650.0000 mg | ORAL_TABLET | Freq: Four times a day (QID) | ORAL | Status: DC | PRN

## 2022-02-06 MED ORDER — POTASSIUM CHLORIDE IN NACL 20-0.9 MEQ/L-% IV SOLN
INTRAVENOUS | Status: AC
  Filled 2022-02-06 (×2): qty 1000

## 2022-02-06 MED ORDER — ACETAMINOPHEN 650 MG RE SUPP: 650.0000 mg | Freq: Four times a day (QID) | RECTAL | Status: DC | PRN

## 2022-02-06 MED ORDER — ONDANSETRON HCL 4 MG PO TABS: 4.0000 mg | ORAL_TABLET | Freq: Four times a day (QID) | ORAL | Status: DC | PRN

## 2022-02-06 MED ORDER — FUROSEMIDE 20 MG PO TABS
20.0000 mg | ORAL_TABLET | Freq: Every day | ORAL | Status: DC
  Administered 2022-02-07: 20 mg via ORAL
  Filled 2022-02-06: qty 1

## 2022-02-06 MED ORDER — SODIUM CHLORIDE 0.9 % IV SOLN
1.0000 g | INTRAVENOUS | Status: DC
  Administered 2022-02-07: 1 g via INTRAVENOUS
  Filled 2022-02-06: qty 10

## 2022-02-06 MED ORDER — IRBESARTAN 150 MG PO TABS
300.0000 mg | ORAL_TABLET | Freq: Every day | ORAL | Status: DC
  Administered 2022-02-07: 300 mg via ORAL
  Filled 2022-02-06: qty 2

## 2022-02-06 MED ORDER — CARVEDILOL 6.25 MG PO TABS
6.2500 mg | ORAL_TABLET | Freq: Two times a day (BID) | ORAL | Status: DC
  Administered 2022-02-07: 6.25 mg via ORAL
  Filled 2022-02-06: qty 1

## 2022-02-06 MED ORDER — ORAL CARE MOUTH RINSE: 15.0000 mL | OROMUCOSAL | Status: DC | PRN

## 2022-02-06 MED ORDER — POTASSIUM CHLORIDE CRYS ER 20 MEQ PO TBCR
40.0000 meq | EXTENDED_RELEASE_TABLET | Freq: Once | ORAL | Status: AC
  Administered 2022-02-06: 40 meq via ORAL
  Filled 2022-02-06: qty 2

## 2022-02-06 MED ORDER — METFORMIN HCL ER 750 MG PO TB24
750.0000 mg | ORAL_TABLET | Freq: Every day | ORAL | Status: DC
  Administered 2022-02-07: 750 mg via ORAL
  Filled 2022-02-06 (×2): qty 1

## 2022-02-06 MED ORDER — ONDANSETRON HCL 4 MG/2ML IJ SOLN: 4.0000 mg | Freq: Four times a day (QID) | INTRAMUSCULAR | Status: DC | PRN

## 2022-02-06 MED ORDER — POTASSIUM CHLORIDE CRYS ER 10 MEQ PO TBCR
10.0000 meq | EXTENDED_RELEASE_TABLET | Freq: Every day | ORAL | Status: DC
  Administered 2022-02-07: 10 meq via ORAL
  Filled 2022-02-06: qty 1

## 2022-02-06 MED ORDER — FENTANYL CITRATE PF 50 MCG/ML IJ SOSY
50.0000 ug | PREFILLED_SYRINGE | Freq: Once | INTRAMUSCULAR | Status: AC
  Administered 2022-02-06: 50 ug via INTRAVENOUS
  Filled 2022-02-06: qty 1

## 2022-02-06 MED ORDER — SODIUM CHLORIDE 0.9 % IV SOLN
1.0000 g | Freq: Once | INTRAVENOUS | Status: AC
  Administered 2022-02-06: 1 g via INTRAVENOUS
  Filled 2022-02-06: qty 10

## 2022-02-06 MED ORDER — ENOXAPARIN SODIUM 40 MG/0.4ML IJ SOSY
40.0000 mg | PREFILLED_SYRINGE | INTRAMUSCULAR | Status: DC
  Administered 2022-02-06 – 2022-02-07 (×2): 40 mg via SUBCUTANEOUS
  Filled 2022-02-06 (×2): qty 0.4

## 2022-02-06 MED ORDER — ROSUVASTATIN CALCIUM 10 MG PO TABS
10.0000 mg | ORAL_TABLET | Freq: Every day | ORAL | Status: DC
  Administered 2022-02-07: 10 mg via ORAL
  Filled 2022-02-06: qty 1

## 2022-02-06 MED ORDER — SODIUM CHLORIDE 0.9 % IV BOLUS
1000.0000 mL | Freq: Once | INTRAVENOUS | Status: AC
  Administered 2022-02-06: 1000 mL via INTRAVENOUS

## 2022-02-06 NOTE — ED Notes (Signed)
Patient  c/o feeling like he was "going to pass out" Patient became clammy. HR-61, BP-66/45, CBG-95

## 2022-02-06 NOTE — ED Triage Notes (Signed)
Patient c/o RLQ abdominal pain x 3 days. Patient denies N/v/D. Patient was seen at [facility] and reports that he was told if the pain worsened to come to this ED.

## 2022-02-06 NOTE — ED Provider Notes (Addendum)
[facility]-EMERGENCY DEPT Provider Note   CSN: [id number] Arrival date & time: 02/06/22  1117     History  Chief Complaint  Patient presents with   Abdominal Pain   Hypotension    [name] Steele is a 81 y.o. male.  HPI 81 year old male presents with right flank pain and right lower quadrant abdominal pain.  This originally started 3 days ago and he went to med [facility] and was diagnosed with a urinary tract infection.  Was given IV Rocephin and discharged home.  Seems to be doing better and has been taking antibiotics since then.  However now has developed recurrent severe abdominal pain.  Its in the same location.  No vomiting.  He gets frequent UTIs and states that this type of pain is very similar to those.  While in the waiting room after triage he developed severe pain and got to the point that he almost passed out.  He was reassessed and his blood pressure was in the 60s and heart rate in the 50s.  He was brought back to a treatment room. Now those symptoms seem to be gone.  Home Medications Prior to Admission medications   Medication Sig Start Date End Date Taking? Authorizing Provider  aspirin EC 81 MG tablet Take 1 tablet (81 mg total) by mouth daily. Swallow whole. 04/14/21   Jake Bathe, MD  carvedilol (COREG) 6.25 MG tablet Take 1 tablet (6.25 mg total) by mouth 2 (two) times daily. 11/07/21   Dunn, Tacey Ruiz, PA-C  cefadroxil (DURICEF) 500 MG capsule Take 2 capsules (1,000 mg total) by mouth 2 (two) times daily for 10 days. 02/03/22 02/13/22  Tegeler, Canary Brim, MD  colchicine 0.6 MG tablet Take 0.6 mg by mouth daily as needed (gout).     [provider]  CRANBERRY PO Take 1 capsule by mouth daily.    [provider]  furosemide (LASIX) 20 MG tablet Take 1 tablet (20 mg total) by mouth daily as needed. Patient taking differently: Take 20 mg by mouth daily. 12/08/21   Dunn, Tacey Ruiz, PA-C  ipratropium (ATROVENT) 0.06 % nasal  spray SMARTSIG:2 Spray(s) Both Nares 3 Times Daily PRN 12/05/21   [provider]  metFORMIN (GLUCOPHAGE-XR) 750 MG 24 hr tablet Take 750 mg by mouth daily after breakfast. 06/05/18   [provider]  rosuvastatin (CRESTOR) 10 MG tablet Take 1 tablet (10 mg total) by mouth daily. 04/14/21   Jake Bathe, MD  telmisartan (MICARDIS) 80 MG tablet Take 80 mg by mouth daily.    [provider]      Allergies    Erythromycin, Penicillin g, and Penicillins    Review of Systems   Review of Systems  Constitutional:  Negative for fever (fever originally, none since).  Respiratory:  Negative for shortness of breath.   Cardiovascular:  Negative for chest pain.  Gastrointestinal:  Positive for abdominal pain.  Genitourinary:  Positive for flank pain.  Neurological:  Positive for light-headedness.    Physical Exam Updated Vital Signs BP (!) 173/89 (BP Location: Left Arm)   Pulse 67   Temp 97.7 F (36.5 C) (Oral)   Resp 17   Ht 5\' 10"  (1.778 m)   Wt 71.2 kg   SpO2 99%   BMI 22.53 kg/m  Physical Exam Vitals and nursing note reviewed.  Constitutional:      Appearance: He is well-developed. He is not ill-appearing or diaphoretic.     Comments: At the  time I am seeing him, he has seemingly recovered from near-syncope  HENT:     Head: Normocephalic and atraumatic.  Cardiovascular:     Rate and Rhythm: Normal rate and regular rhythm.     Heart sounds: Normal heart sounds.  Pulmonary:     Effort: Pulmonary effort is normal.     Breath sounds: Normal breath sounds.  Abdominal:     Palpations: Abdomen is soft.     Tenderness: There is abdominal tenderness in the right lower quadrant. There is right CVA tenderness.  Skin:    General: Skin is warm and dry.  Neurological:     Mental Status: He is alert.     ED Results / Procedures / Treatments   Labs (all labs ordered are listed, but only abnormal results are displayed) Labs Reviewed  COMPREHENSIVE METABOLIC  PANEL - Abnormal; Notable for the following components:      Result Value   Potassium 3.4 (*)    Glucose, Bld 114 (*)    Albumin 3.3 (*)    All other components within normal limits  CBC - Abnormal; Notable for the following components:   RBC 4.02 (*)    Hemoglobin 12.7 (*)    All other components within normal limits  CULTURE, BLOOD (ROUTINE X 2)  CULTURE, BLOOD (ROUTINE X 2)  LIPASE, BLOOD  LACTIC ACID, PLASMA  URINALYSIS, ROUTINE W REFLEX MICROSCOPIC  CBG MONITORING, ED    EKG EKG Interpretation  Date/Time:  Friday February 06 2022 12:22:41 EDT Ventricular Rate:  58 PR Interval:  230 QRS Duration: 94 QT Interval:  432 QTC Calculation: 424 R Axis:   -43 Text Interpretation: Sinus bradycardia with 1st degree 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 Additional history provided: History of diabetes and hypertension. Former smoker. EXAM: PORTABLE CHEST 1 VIEW COMPARISON:  Prior chest radiographs 12/15/2017 and earlier. Chest CT 12/15/2017. FINDINGS: Heart size at the upper limits of normal. Aortic atherosclerosis. No appreciable airspace consolidation or pulmonary edema. No evidence of pleural effusion or pneumothorax. Degenerative changes of the spine. Redemonstrated chronic fracture deformity of the proximal left humerus. IMPRESSION: No evidence of acute cardiopulmonary abnormality. Aortic Atherosclerosis (ICD10-I70.0). Electronically Signed   By: Kellie Simmering D.O.   On: 02/06/2022 13:05    Procedures Procedures    Medications Ordered in ED Medications  0.9 % NaCl with KCl 20 mEq/ L  infusion ( Intravenous Infusion Verify 02/06/22 1550)  potassium chloride SA (KLOR-CON M) CR tablet 40 mEq (has no administration in time range)  enoxaparin (LOVENOX) injection  40 mg (has no administration in time range)  acetaminophen (TYLENOL) tablet 650 mg (has no administration in time range)    Or  acetaminophen (TYLENOL) suppository 650 mg (has no administration in time range)  ondansetron (ZOFRAN) tablet 4 mg (has no administration in time range)    Or  ondansetron (ZOFRAN) injection 4 mg (has no administration in time range)  Oral care mouth rinse (has no administration in time range)  fentaNYL (SUBLIMAZE) injection 50 mcg (50 mcg Intravenous Given 02/06/22 1431)  cefTRIAXone (ROCEPHIN) 1 g in sodium chloride 0.9 % 100 mL IVPB (0 g Intravenous Stopped 02/06/22 1340)  sodium chloride 0.9 % bolus 1,000 mL (0 mLs Intravenous Stopped 02/06/22 1410)    ED Course/ Medical Decision Making/  A&P                           Medical Decision Making Amount and/or Complexity of Data Reviewed Labs: ordered.    Details: Mild anemia, mild hypokalemia. Radiology: ordered and independent interpretation performed.    Details: No pneumonia ECG/medicine tests: independent interpretation performed.    Details: Mild bradycardia with type I AV block  Risk Prescription drug management. Decision regarding hospitalization.   Patient had a near syncopal episode in the waiting room that seem to be pain related.  I reviewed his ED visit from a couple days ago and it seems like he has a urinary tract infection that is pansensitive to E. coli.  He was given IV Rocephin here.  I do not think he needs to be repeat scanned.  He does have a small aortic aneurysm but I doubt this is worsening based on exam and presentation.  This seems consistent with multiple prior UTIs.  Offered observation admission versus going home and patient prefers to be observed.  Can continue on fluids and give pain control.  Discussed with Dr. Robb Matar.        Final Clinical Impression(s) / ED Diagnoses Final diagnoses:  Vasovagal episode    Rx / DC Orders ED Discharge Orders     None          Pricilla Loveless, MD 02/06/22 1553    Pricilla Loveless, MD 02/06/22 [phone]

## 2022-02-06 NOTE — H&P (Signed)
History and Physical    Patient: [name] Steele ACZ:[id number] DOB: 08-Jul-1940 DOA: 02/06/2022 DOS: the patient was seen and examined on 02/06/2022 PCP: Deland Pretty, MD  Patient coming from: Home  Chief Complaint:  Chief Complaint  Patient presents with   Abdominal Pain   Hypotension   HPI: [name] Steele is a 81 y.o. male with medical history significant of osteoarthritis, stage IIIa CKD,, urinary tract infection, recurrent acute UTIs, coronary artery calcification, type II DM, gout, interstitial lung disease, mild aortic stenosis and mild ascending dilatation, osteoporosis who was brought to the emergency department after having a near syncopal episode while in the waiting room.  His SBP was in the 60s and his heart rate was in the 50s.  He was seen 3 days ago at [facility] due to dysuria, right flank and RLQ pain.  No hematuria.  He was given Rocephin and discharged home on oral cefadroxil which he has been taking. He denied fever, chills, rhinorrhea, sore throat, wheezing or hemoptysis.  No chest pain, palpitations, diaphoresis, PND, orthopnea or pitting edema of the lower extremities.  He gets frequent loose stools and flatulence after cholecystectomy, but no  nausea, emesis, constipation, melena or hematochezia.  No polyuria, polydipsia, polyphagia or blurred vision.   ED course: Initial vital signs were temperature 97.8 F, pulse 59, respirations 16, BP 170/88 mmHg O2 sat 96% on room air.  The patient received ceftriaxone 1 g IVPB, fentanyl 50 mcg IVP and 1000 mL of normal saline bolus.  Lab work: Urinalysis showed large leukocyte esterase.  CBC with a white count 5.6, hemoglobin 12.7 g/dL platelets 226.  Lipase and lactic acid were normal.  CMP showed potassium 3.4 mmol/L, glucose 114 mg/dL and albumin 3.3 g/dL.  Imaging: Portable 1 view chest radiograph with no evidence of acute cardiopulmonary normality.  There was aortic atherosclerosis.  CT abdomen/pelvis with contrast  showed urinary bladder wall thickening.  There was a 3.8 x 3.6 infrarenal AAA with ultrasound recommendation every 3 years.  Multiple bilateral simple renal cysts.  Prostatomegaly.   Review of Systems: As mentioned in the history of present illness. All other systems reviewed and are negative. Past Medical History:  Diagnosis Date   Arthritis    Chronic kidney disease, stage 3a (HCC)    Chronic urinary tract infection    Coronary artery calcification seen on CT scan    Diabetes mellitus without complication (HCC)    Gout    Hypertension    ILD (interstitial lung disease) ([place])    by CT 01/2021   Mild aortic stenosis    Mild dilation of ascending aorta (HCC)    Osteoporosis    Past Surgical History:  Procedure Laterality Date   CHOLECYSTECTOMY N/A 07/04/2018   Procedure: LAPAROSCOPIC CHOLECYSTECTOMY WITH INTRAOPERATIVE CHOLANGIOGRAM;  Surgeon: Coralie Keens, MD;  Location: [facility]S;  Service: General;  Laterality: N/A;   ERCP N/A 07/05/2018   Procedure: ENDOSCOPIC RETROGRADE CHOLANGIOPANCREATOGRAPHY (ERCP);  Surgeon: Clarene Essex, MD;  Location: [facility];  Service: Endoscopy;  Laterality: N/A;   JOINT REPLACEMENT Bilateral    REMOVAL OF STONES  07/05/2018   Procedure: REMOVAL OF STONES;  Surgeon: Clarene Essex, MD;  Location: [facility];  Service: Endoscopy;;   SPHINCTEROTOMY  07/05/2018   Procedure: Joan Mayans;  Surgeon: Clarene Essex, MD;  Location: [facility];  Service: Endoscopy;;   Social History:  reports that he quit smoking about 35 years ago. His smoking use included cigarettes. He has a 31.00 pack-year smoking history. He has  never used smokeless tobacco. He reports current alcohol use. He reports that he does not use drugs.  Allergies  Allergen Reactions   Erythromycin Other (See Comments)    Severe Abdominal Pain   Penicillin G Other (See Comments)   Penicillins Hives    Has patient had a PCN reaction causing immediate rash, facial/tongue/throat swelling, SOB or  lightheadedness with hypotension:  Yes  Has patient had a PCN reaction causing severe rash involving mucus membranes or skin necrosis:  No Has patient had a PCN reaction that required hospitalization: No Has patient had a PCN reaction occurring within the last 10 years: No If all of the above answers are "NO", then may proceed with Cephalosporin use. Tolerated CTX 11/23 admit     Family History  Problem Relation Age of Onset   Heart disease Sister     Prior to Admission medications   Medication Sig Start Date End Date Taking? Authorizing Provider  aspirin EC 81 MG tablet Take 1 tablet (81 mg total) by mouth daily. Swallow whole. 04/14/21   Jerline Pain, MD  carvedilol (COREG) 6.25 MG tablet Take 1 tablet (6.25 mg total) by mouth 2 (two) times daily. 11/07/21   Dunn, Nedra Hai, PA-C  cefadroxil (DURICEF) 500 MG capsule Take 2 capsules (1,000 mg total) by mouth 2 (two) times daily for 10 days. 02/03/22 02/13/22  Tegeler, Gwenyth Allegra, MD  colchicine 0.6 MG tablet Take 0.6 mg by mouth daily as needed (gout).     [provider]  CRANBERRY PO Take 1 capsule by mouth daily.    [provider]  furosemide (LASIX) 20 MG tablet Take 1 tablet (20 mg total) by mouth daily as needed. Patient taking differently: Take 20 mg by mouth daily. 12/08/21   Dunn, Nedra Hai, PA-C  ipratropium (ATROVENT) 0.06 % nasal spray SMARTSIG:2 Spray(s) Both Nares 3 Times Daily PRN 12/05/21   [provider]  metFORMIN (GLUCOPHAGE-XR) 750 MG 24 hr tablet Take 750 mg by mouth daily after breakfast. 06/05/18   [provider]  rosuvastatin (CRESTOR) 10 MG tablet Take 1 tablet (10 mg total) by mouth daily. 04/14/21   Jerline Pain, MD  telmisartan (MICARDIS) 80 MG tablet Take 80 mg by mouth daily.    [provider]    Physical Exam: Vitals:   02/06/22 1159 02/06/22 1230 02/06/22 1300 02/06/22 1501  BP:  138/65 (!) 154/67 (!) 173/89  Pulse:  (!) 51 (!) 53 67  Resp:  15 19 17    Temp:    97.7 F (36.5 C)  TempSrc:    Oral  SpO2:  99% 98% 99%  Weight: 71.2 kg     Height: 5\' 10"  (1.778 m)      Physical Exam Vitals and nursing note reviewed.  Constitutional:      General: He is awake.     Appearance: He is well-developed. He is not ill-appearing.  HENT:     Head: Normocephalic.     Nose: No rhinorrhea.     Mouth/Throat:     Mouth: Mucous membranes are moist.  Eyes:     General: No scleral icterus.    Pupils: Pupils are equal, round, and reactive to light.  Neck:     Vascular: No JVD.  Cardiovascular:     Rate and Rhythm: Normal rate and regular rhythm.     Heart sounds: S1 normal and S2 normal.  Pulmonary:     Effort: Pulmonary effort is normal.     Breath  sounds: No wheezing, rhonchi or rales.  Abdominal:     General: Bowel sounds are normal. There is no distension.     Tenderness: There is abdominal tenderness in the right lower quadrant and suprapubic area.  Musculoskeletal:     Cervical back: Neck supple.     Right lower leg: No edema.     Left lower leg: No edema.  Skin:    General: Skin is warm and dry.  Neurological:     General: No focal deficit present.     Mental Status: He is alert and oriented to person, place, and time.  Psychiatric:        Mood and Affect: Mood normal.        Behavior: Behavior normal. Behavior is cooperative.   Data Reviewed:  Results are pending, will review when available.  Assessment and Plan: Principal Problem:   Near syncope Observation/telemetry. Continue IV fluids. Hold beta-blocker. Hold antihypertensives today. Correct electrolyte abnormality. Check carotid Doppler. Check echocardiogram.  Active Problems:   E. coli UTI Continue ceftriaxone 1 g IVPB daily.    Hypokalemia Replacement ordered.   Follow potassium level.    Type 2 diabetes mellitus without complication (HCC) Carbohydrate modified diet. CBG monitoring before meals and bedtime. Continue metformin 750 mg p.o. daily.     Essential hypertension Hold carvedilol tonight. Resume furosemide and ARB in the morning. Monitor BP, HR, renal function electrolytes.    Coronary artery disease involving native  coronary artery of native heart with angina pectoris (HCC) Continue aspirin and rosuvastatin. Resume beta-blocker in the morning.    Infrarenal abdominal aortic aneurysm (AAA) without rupture ([place]) Ultrasound surveillance every 3 years recommended. Follow-up with PCP and/or vascular surgery as an outpatient.    Normocytic anemia Mild.  Around baseline. Monitor hematocrit and hemoglobin.    Advance Care Planning:   Code Status: Full Code   Consults:   Family Communication: His wife of 1 years was at bedside.  Severity of Illness: The appropriate patient status for this patient is OBSERVATION. Observation status is judged to be reasonable and necessary in order to provide the required intensity of service to ensure the patient's safety. The patient's presenting symptoms, physical exam findings, and initial radiographic and laboratory data in the context of their medical condition is felt to place them at decreased risk for further clinical deterioration. Furthermore, it is anticipated that the patient will be medically stable for discharge from the hospital within 2 midnights of admission.   Author: Reubin Milan, MD 02/06/2022 3:07 PM  For on call review [email or web address].   This document was prepared using Dragon voice recognition software and may contain some unintended transcription errors.

## 2022-02-07 ENCOUNTER — Observation Stay (HOSPITAL_BASED_OUTPATIENT_CLINIC_OR_DEPARTMENT_OTHER): Payer: Medicare Other

## 2022-02-07 DIAGNOSIS — R55 Syncope and collapse: Secondary | ICD-10-CM

## 2022-02-07 LAB — BASIC METABOLIC PANEL
Anion gap: 5 (ref 5–15)
BUN: 9 mg/dL (ref 8–23)
CO2: 25 mmol/L (ref 22–32)
Calcium: 8 mg/dL — ABNORMAL LOW (ref 8.9–10.3)
Chloride: 112 mmol/L — ABNORMAL HIGH (ref 98–111)
Creatinine, Ser: 0.7 mg/dL (ref 0.61–1.24)
GFR, Estimated: >60 mL/min (ref >60)
Glucose, Bld: 86 mg/dL (ref 70–99)
Potassium: 3.8 mmol/L (ref 3.5–5.1)
Sodium: 142 mmol/L (ref 135–145)

## 2022-02-07 LAB — GLUCOSE, CAPILLARY
Glucose-Capillary: 86 mg/dL (ref 70–99)
Glucose-Capillary: 100 mg/dL — ABNORMAL HIGH (ref 70–99)
Glucose-Capillary: 79 mg/dL (ref 70–99)

## 2022-02-07 LAB — ECHOCARDIOGRAM COMPLETE
AR max vel: 1.44 cm2
AV Area VTI: 1.71 cm2
AV Area mean vel: 1.5 cm2
AV Mean grad: 7 mmHg
AV Peak grad: 12.4 mmHg
Ao pk vel: 1.76 m/s
Calc EF: 59.1 %
Height: 70 in
S' Lateral: 3.4 cm
Single Plane A2C EF: 49.4 %
Single Plane A4C EF: 65.4 %
Weight: 2512 oz

## 2022-02-07 LAB — CBC
HCT: 33.8 % — ABNORMAL LOW (ref 39.0–52.0)
Hemoglobin: 10.6 g/dL — ABNORMAL LOW (ref 13.0–17.0)
MCH: 31.4 pg (ref 26.0–34.0)
MCHC: 31.4 g/dL (ref 30.0–36.0)
MCV: 100 fL (ref 80.0–100.0)
Platelets: 168 10*3/uL (ref 150–400)
RBC: 3.38 MIL/uL — ABNORMAL LOW (ref 4.22–5.81)
RDW: 13.9 % (ref 11.5–15.5)
WBC: 4.6 10*3/uL (ref 4.0–10.5)
nRBC: 0 % (ref 0.0–0.2)

## 2022-02-07 LAB — HEMOGLOBIN A1C
Hgb A1c MFr Bld: 5.3 % (ref 4.8–5.6)
Mean Plasma Glucose: 105.41 mg/dL

## 2022-02-07 NOTE — Progress Notes (Signed)
  Echocardiogram 2D Echocardiogram has been performed.  [name] Steele [name] [name] 02/07/2022, 11:28 AM 

## 2022-02-07 NOTE — Evaluation (Signed)
Occupational Therapy Evaluation Patient Details Name: [name] Steele MRN: [id number] DOB: 1941/02/26 Today's Date: 02/07/2022   History of Present Illness 81 yo male presents with osteoarthritis, stage IIIa CKD,, urinary tract infection, recurrent acute UTIs, coronary artery calcification, type II DM, gout, interstitial lung disease, mild aortic stenosis and mild ascending dilatation, osteoporosis, near syncope   Clinical Impression   [name] Steele is an 81 year old male with above medical history. Prior to hospitalization patient was Independent with ADLs/IADLs. Patient now presents independent for supine to sit,sit to stand, and toileting tasks ambulating without a walker. Patient independent for donning/doffing shoes at bed side, and stated not feeling dizzy when bending down. Patient appears to be near baseline, and does not have OT needs. Recommend home at discharge.      Recommendations for follow up therapy are one component of a multi-disciplinary discharge planning process, led by the attending physician.  Recommendations may be updated based on patient status, additional functional criteria and insurance authorization.   Follow Up Recommendations  No OT follow up    Assistance Recommended at Discharge None  Patient can return home with the following      Functional Status Assessment     Equipment Recommendations  None recommended by OT    Recommendations for Other Services       Precautions / Restrictions Precautions Precautions: Fall Restrictions Weight Bearing Restrictions: No      Mobility Bed Mobility Overal bed mobility: Independent Bed Mobility: Supine to Sit     Supine to sit: Independent          Transfers Overall transfer level: Independent Equipment used: None Transfers: Sit to/from Stand Sit to Stand: Independent                  Balance Overall balance assessment: Independent Sitting-balance support: No upper extremity  supported, Feet supported Sitting balance-Leahy Scale: Good Sitting balance - Comments: don shoes at EOB   Standing balance support: No upper extremity supported, During functional activity Standing balance-Leahy Scale: Good                             ADL either performed or assessed with clinical judgement   ADL Overall ADL's : Modified independent                                             Vision   Vision Assessment?: No apparent visual deficits     Perception     Praxis      Pertinent Vitals/Pain Pain Assessment Pain Assessment: Faces Faces Pain Scale: No hurt     Hand Dominance Right   Extremity/Trunk Assessment Upper Extremity Assessment Upper Extremity Assessment: Overall WFL for tasks assessed   Lower Extremity Assessment Lower Extremity Assessment: Defer to PT evaluation   Cervical / Trunk Assessment Cervical / Trunk Assessment: Normal   Communication Communication Communication: HOH   Cognition Arousal/Alertness: Awake/alert Behavior During Therapy: WFL for tasks assessed/performed Overall Cognitive Status: Within Functional Limits for tasks assessed                                       General Comments       Exercises     Shoulder Instructions  Home Living Family/patient expects to be discharged to:: Private residence Living Arrangements: Spouse/significant other Available Help at Discharge: Family Type of Home: House Home Access: Level entry     [place]: One level     Bathroom Shower/Tub: Occupational psychologist: [place]: Conservation officer, nature (2 wheels);Cane - single point          Prior Functioning/Environment Prior Level of Function : Independent/Modified Independent                        OT Problem List:        OT Treatment/Interventions:      OT Goals(Current goals can be found in the care plan section) Acute Rehab OT  Goals OT Goal Formulation: All assessment and education complete, DC therapy  OT Frequency:      Co-evaluation              AM-PAC OT "6 Clicks" Daily Activity     Outcome Measure Help from another person eating meals?: None Help from another person taking care of personal grooming?: None Help from another person toileting, which includes using toliet, bedpan, or urinal?: None Help from another person bathing (including washing, rinsing, drying)?: None Help from another person to put on and taking off regular upper body clothing?: None Help from another person to put on and taking off regular lower body clothing?: None 6 Click Score: 24   End of Session Equipment Utilized During Treatment: Gait belt  Activity Tolerance: Patient tolerated treatment well Patient left: in bed;with family/visitor present                   Time: [id number] OT Time Calculation (min): 12 min Charges:  OT General Charges $OT Visit: 1 Visit OT Evaluation $OT Eval Low Complexity: [place], OTS Acute rehab services   Charlann Lange 02/07/2022, 3:51 PM

## 2022-02-07 NOTE — Hospital Course (Addendum)
81 y.o. male with osteoarthritis, stage IIIa CKD, recurrent UTI,coronary artery calcification, type II DM, gout, interstitial lung disease, mild aortic stenosis and mild ascending dilatation, osteoporosis who was brought to the ED having a near syncopal episode while in the waiting room. SBP was in the 60s and his heart rate was in the 50s.He was seen 3 days ago at [facility] due to dysuria, right flank and RLQ pain.  No hematuria>given Rocephin and discharged home on oral cefadroxil which he has been taking. In VH:QIONGEX vital signs were temperature 97.8 F, pulse 59, respirations 16, BP 170/88 mmHg O2 sat 96% on room air.  The patient received ceftriaxone 1 g IVPB, fentanyl 50 mcg IVP and 1000 mL of normal saline bolus. UA-large leukocyte esterase, WBC-  6-10.CBC with a white count 5.6, hemoglobin 12.7 g/dL platelets 226.  Lipase and lactic acid were normal.  CMP showed potassium 3.4 mmol/L, glucose 114 mg/dL and albumin 3.3 g/dL. Cxr-no evidence of acute cardiopulmonary normality.There was aortic atherosclerosis.  CT abdomen/pelvis with contrast showed urinary bladder wall thickening.There was a 3.8 x 3.6 infrarenal AAA with ultrasound recommendation every 3 years.  Multiple bilateral simple renal cysts.  Prostatomegaly. EKG Sinus bradycardia 58 with first-degree AV block, LAD possible lateral infarct is undetermined similar to 2019 Patient was admitted for near syncope kept on IV fluids beta-blocker held BP medication held echo and Doppler ordered.  Recent urine culture grew E. coli placed on ceftriaxone, potassium replaced. Orthostatic vitals done and unremarkable.  Patient is feeling fine, electrolytes are stable. Echo completed EF 60 to 65%, mild LVH, no RWMA moderate calcification of the aortic valve moderate thickening of the aortic valve mild aortic valve stenosis. carotid ultrasound also done and 1-39% stenosis b/l ,stable, seen by PTOT and is stable for discharge home today.

## 2022-02-07 NOTE — Plan of Care (Signed)
Pt going home this afternoon with his spouse. Alert and oriented.

## 2022-02-07 NOTE — Progress Notes (Signed)
Carotid artery duplex has been completed. Preliminary results can be found in CV Proc through chart review.   02/07/22 2:44 PM [name] Steele RVT

## 2022-02-07 NOTE — Discharge Summary (Signed)
Physician Discharge Summary  [name] Steele [id number] DOB: 12-Jan-1941 DOA: 02/06/2022  PCP: Deland Pretty, MD  Admit date: 02/06/2022 Discharge date: 02/07/2022 Recommendations for Outpatient Follow-up:  Follow up with PCP in 1 weeks-call for appointment  Discharge Dispo: home Discharge Condition: Stable Code Status:   Code Status: Full Code Diet recommendation:  Diet Order             Diet - low sodium heart healthy           Diet heart healthy/carb modified Room service appropriate? Yes; Fluid consistency: Thin  Diet effective now                    Brief/Interim Summary:  81 y.o. male with osteoarthritis, stage IIIa CKD, recurrent UTI,coronary artery calcification, type II DM, gout, interstitial lung disease, mild aortic stenosis and mild ascending dilatation, osteoporosis who was brought to the ED having a near syncopal episode while in the waiting room. SBP was in the 60s and his heart rate was in the 50s.He was seen 3 days ago at [facility] due to dysuria, right flank and RLQ pain.  No hematuria>given Rocephin and discharged home on oral cefadroxil which he has been taking. In [id number] vital signs were temperature 97.8 F, pulse 59, respirations 16, BP 170/88 mmHg O2 sat 96% on room air.  The patient received ceftriaxone 1 g IVPB, fentanyl 50 mcg IVP and 1000 mL of normal saline bolus. UA-large leukocyte esterase, WBC-  6-10.CBC with a white count 5.6, hemoglobin 12.7 g/dL platelets 226.  Lipase and lactic acid were normal.  CMP showed potassium 3.4 mmol/L, glucose 114 mg/dL and albumin 3.3 g/dL. Cxr-no evidence of acute cardiopulmonary normality.There was aortic atherosclerosis.  CT abdomen/pelvis with contrast showed urinary bladder wall thickening.There was a 3.8 x 3.6 infrarenal AAA with ultrasound recommendation every 3 years.  Multiple bilateral simple renal cysts.  Prostatomegaly. EKG Sinus bradycardia 58 with first-degree AV block, LAD possible lateral  infarct is undetermined similar to 2019 Patient was admitted for near syncope kept on IV fluids beta-blocker held BP medication held echo and Doppler ordered.  Recent urine culture grew E. coli placed on ceftriaxone, potassium replaced. Orthostatic vitals done and unremarkable.  Patient is feeling fine, electrolytes are stable. Echo completed EF 60 to 65%, mild LVH, no RWMA moderate calcification of the aortic valve moderate thickening of the aortic valve mild aortic valve stenosis. carotid ultrasound also done and 1-39% stenosis b/l ,stable, seen by PTOT and is stable for discharge home today.     Discharge Diagnoses:  Principal Problem:   Near syncope Active Problems:   Type 2 diabetes mellitus without complication (HCC)   Essential hypertension   Coronary artery disease involving native coronary artery of native heart with angina pectoris (HCC)   E. coli UTI   Infrarenal abdominal aortic aneurysm (AAA) without rupture (HCC)   Hypokalemia   Normocytic anemia  Near syncope: Unclear etiology, vitals stable work-up with echocardiogram carotid duplex orthostatic vitals unremarkable.  Encourage oral hydration, outpatient follow with PCP.  Can continue TED stockings.  Type 2 diabetes mellitus without complication: Blood sugar stable continue metformin at home Essential hypertension: Now trending up can resume antihypertensives upon discharge Coronary artery disease involving native coronary artery of native heart with angina pectoris (HCC) E. coli UTI: Patient has oral antibiotics at home which she will continue Infrarenal abdominal aortic aneurysm (AAA) without rupture: Follow-up every 3 months with ultrasound surveillance Hypokalemia: Replaced Normocytic anemia: Remained stable  Consults: none Subjective: Aaox3, feels ready for home  Discharge Exam: Vitals:   02/07/22 1022 02/07/22 1228  BP: (!) 178/92 (!) 153/89  Pulse: 75 66  Resp: 18 20  Temp: 98 F (36.7 C) 97.6 F (36.4 C)   SpO2: 98% 96%   General: Pt is alert, awake, not in acute distress Cardiovascular: RRR, S1/S2 +, no rubs, no gallops Respiratory: CTA bilaterally, no wheezing, no rhonchi Abdominal: Soft, NT, ND, bowel sounds + Extremities: no edema, no cyanosis  Discharge Instructions  Discharge Instructions     (HEART FAILURE PATIENTS) Call MD:  Anytime you have any of the following symptoms: 1) 3 pound weight gain in 24 hours or 5 pounds in 1 week 2) shortness of breath, with or without a dry hacking cough 3) swelling in the hands, feet or stomach 4) if you have to sleep on extra pillows at night in order to breathe.   Complete by: As directed    Diet - low sodium heart healthy   Complete by: As directed    Discharge instructions   Complete by: As directed    Follow-up with the cardiology team  Please call call MD or return to ER for similar or worsening recurring problem that brought you to hospital or if any fever,nausea/vomiting,abdominal pain, uncontrolled pain, chest pain,  shortness of breath or any other alarming symptoms.  Please follow-up your doctor as instructed in a week time and call the office for appointment.  Please avoid alcohol, smoking, or any other illicit substance and maintain healthy habits including taking your regular medications as prescribed.  You were cared for by a hospitalist during your hospital stay. If you have any questions about your discharge medications or the care you received while you were in the hospital after you are discharged, you can call the unit and ask to speak with the hospitalist on call if the hospitalist that took care of you is not available.  Once you are discharged, your primary care physician will handle any further medical issues. Please note that NO REFILLS for any discharge medications will be authorized once you are discharged, as it is imperative that you return to your primary care physician (or establish a relationship with a primary care  physician if you do not have one) for your aftercare needs so that they can reassess your need for medications and monitor your lab values   Increase activity slowly   Complete by: As directed       Allergies as of 02/07/2022       Reactions   Erythromycin Other (See Comments)   Severe Abdominal Pain   Penicillin G Other (See Comments)   Penicillins Hives   Has patient had a PCN reaction causing immediate rash, facial/tongue/throat swelling, SOB or lightheadedness with hypotension:  Yes  Has patient had a PCN reaction causing severe rash involving mucus membranes or skin necrosis:  No Has patient had a PCN reaction that required hospitalization: No Has patient had a PCN reaction occurring within the last 10 years: No If all of the above answers are "NO", then may proceed with Cephalosporin use. Tolerated CTX 11/23 admit        Medication List     TAKE these medications    aspirin EC 81 MG tablet Take 1 tablet (81 mg total) by mouth daily. Swallow whole.   carvedilol 6.25 MG tablet Commonly known as: COREG Take 1 tablet (6.25 mg total) by mouth 2 (two) times daily.   cefadroxil  500 MG capsule Commonly known as: DURICEF Take 2 capsules (1,000 mg total) by mouth 2 (two) times daily for 10 days.   colchicine 0.6 MG tablet Take 0.6 mg by mouth daily as needed (gout).   CRANBERRY PO Take 1 capsule by mouth daily.   furosemide 20 MG tablet Commonly known as: LASIX Take 1 tablet (20 mg total) by mouth daily as needed. What changed: when to take this   ipratropium 0.06 % nasal spray Commonly known as: ATROVENT SMARTSIG:2 Spray(s) Both Nares 3 Times Daily PRN   metFORMIN 750 MG 24 hr tablet Commonly known as: GLUCOPHAGE-XR Take 750 mg by mouth daily after breakfast.   rosuvastatin 10 MG tablet Commonly known as: Crestor Take 1 tablet (10 mg total) by mouth daily.   telmisartan 80 MG tablet Commonly known as: MICARDIS Take 80 mg by mouth daily.         Follow-up Information     Deland Pretty, MD Follow up in 1 week(s).   Specialty: Internal Medicine Contact information: [address] [place] [address] [phone]         Jerline Pain, MD .   Specialty: Cardiology Contact information: [phone] [place] [address] [phone]                Allergies  Allergen Reactions   Erythromycin Other (See Comments)    Severe Abdominal Pain   Penicillin G Other (See Comments)   Penicillins Hives    Has patient had a PCN reaction causing immediate rash, facial/tongue/throat swelling, SOB or lightheadedness with hypotension:  Yes  Has patient had a PCN reaction causing severe rash involving mucus membranes or skin necrosis:  No Has patient had a PCN reaction that required hospitalization: No Has patient had a PCN reaction occurring within the last 10 years: No If all of the above answers are "NO", then may proceed with Cephalosporin use. Tolerated CTX 11/23 admit     The results of significant diagnostics from this hospitalization (including imaging, microbiology, ancillary and laboratory) are listed below for reference.    Microbiology: Recent Results (from the past 240 hour(s))  Urine Culture     Status: Abnormal   Collection Time: 02/03/22  9:12 PM   Specimen: Urine, Clean Catch  Result Value Ref Range Status   Specimen Description   Final    URINE, CLEAN CATCH Performed at [facility], [address] [place], [place] [address]    Special Requests   Final    NONE Performed at [facility], [address] [place], [place] [address]    Culture >=100,000 COLONIES/mL ESCHERICHIA COLI (A)  Final   Report Status 02/06/2022 FINAL  Final   Organism ID, Bacteria ESCHERICHIA COLI (A)  Final      Susceptibility   Escherichia coli - MIC*    AMPICILLIN <=2 SENSITIVE Sensitive     CEFAZOLIN <=4 SENSITIVE Sensitive     CEFEPIME <=0.12 SENSITIVE  Sensitive     CEFTRIAXONE <=0.25 SENSITIVE Sensitive     CIPROFLOXACIN <=0.25 SENSITIVE Sensitive     GENTAMICIN <=1 SENSITIVE Sensitive     IMIPENEM <=0.25 SENSITIVE Sensitive     NITROFURANTOIN <=16 SENSITIVE Sensitive     TRIMETH/SULFA <=20 SENSITIVE Sensitive     AMPICILLIN/SULBACTAM <=2 SENSITIVE Sensitive     PIP/TAZO <=4 SENSITIVE Sensitive     * >=100,000 COLONIES/mL ESCHERICHIA COLI  Blood culture (routine x 2)  Status: Abnormal   Collection Time: 02/03/22  9:19 PM   Specimen: Right Antecubital; Blood  Result Value Ref Range Status   Specimen Description   Final    RIGHT ANTECUBITAL BLOOD Performed at [facility], [address], [place], [place] [address]    Special Requests   Final    BOTTLES DRAWN AEROBIC AND ANAEROBIC Blood Culture adequate volume Performed at [facility], [address] [place], [place] [address]    Culture  Setup Time   Final    GRAM POSITIVE COCCI IN CLUSTERS ANAEROBIC BOTTLE ONLY Organism ID to follow CRITICAL RESULT CALLED TO, READ BACK BY AND VERIFIED WITH: RN L. Audelia Hives [phone] @2240  FH AEROBIC BOTTLE ONLY GRAM VARIABLE ROD CRITICAL RESULT CALLED TO, READ BACK BY AND VERIFIED WITH:  C/ L. Audelia Hives, RN 02/05/22 0441 A. LAFRANCE     Culture (A)  Final    STAPHYLOCOCCUS HOMINIS THE SIGNIFICANCE OF ISOLATING THIS ORGANISM FROM A SINGLE SET OF BLOOD CULTURES WHEN MULTIPLE SETS ARE DRAWN IS UNCERTAIN. PLEASE NOTIFY THE MICROBIOLOGY DEPARTMENT WITHIN ONE WEEK IF SPECIATION AND SENSITIVITIES ARE REQUIRED. BACILLUS SPECIES Standardized susceptibility testing for this organism is not available. Performed at [facility], [place] [address], [place], [place] [address]    Report Status 02/06/2022 FINAL  Final  Blood Culture ID Panel (Reflexed)     Status: Abnormal   Collection Time: 02/03/22  9:19 PM  Result Value Ref Range Status   Enterococcus faecalis NOT DETECTED NOT DETECTED Final   Enterococcus Faecium NOT DETECTED NOT  DETECTED Final   Listeria monocytogenes NOT DETECTED NOT DETECTED Final   Staphylococcus species DETECTED (A) NOT DETECTED Final    Comment: CRITICAL RESULT CALLED TO, READ BACK BY AND VERIFIED WITH: RN Ladell Pier [phone] @2242  FH    Staphylococcus aureus (BCID) NOT DETECTED NOT DETECTED Final   Staphylococcus epidermidis NOT DETECTED NOT DETECTED Final   Staphylococcus lugdunensis NOT DETECTED NOT DETECTED Final   Streptococcus species NOT DETECTED NOT DETECTED Final   Streptococcus agalactiae NOT DETECTED NOT DETECTED Final   Streptococcus pneumoniae NOT DETECTED NOT DETECTED Final   Streptococcus pyogenes NOT DETECTED NOT DETECTED Final   A.calcoaceticus-baumannii NOT DETECTED NOT DETECTED Final   Bacteroides fragilis NOT DETECTED NOT DETECTED Final   Enterobacterales NOT DETECTED NOT DETECTED Final   Enterobacter cloacae complex NOT DETECTED NOT DETECTED Final   Escherichia coli NOT DETECTED NOT DETECTED Final   Klebsiella aerogenes NOT DETECTED NOT DETECTED Final   Klebsiella oxytoca NOT DETECTED NOT DETECTED Final   Klebsiella pneumoniae NOT DETECTED NOT DETECTED Final   Proteus species NOT DETECTED NOT DETECTED Final   Salmonella species NOT DETECTED NOT DETECTED Final   Serratia marcescens NOT DETECTED NOT DETECTED Final   Haemophilus influenzae NOT DETECTED NOT DETECTED Final   Neisseria meningitidis NOT DETECTED NOT DETECTED Final   Pseudomonas aeruginosa NOT DETECTED NOT DETECTED Final   Stenotrophomonas maltophilia NOT DETECTED NOT DETECTED Final   Candida albicans NOT DETECTED NOT DETECTED Final   Candida auris NOT DETECTED NOT DETECTED Final   Candida glabrata NOT DETECTED NOT DETECTED Final   Candida krusei NOT DETECTED NOT DETECTED Final   Candida parapsilosis NOT DETECTED NOT DETECTED Final   Candida tropicalis NOT DETECTED NOT DETECTED Final   Cryptococcus neoformans/gattii NOT DETECTED NOT DETECTED Final    Comment: Performed at [facility], [address], [place], [place] [address]  Blood Culture ID Panel (Reflexed)  Status: None   Collection Time: 02/05/22  3:20 AM  Result Value Ref Range Status   Enterococcus faecalis NOT DETECTED NOT DETECTED Final   Enterococcus Faecium NOT DETECTED NOT DETECTED Final   Listeria monocytogenes NOT DETECTED NOT DETECTED Final   Staphylococcus species NOT DETECTED NOT DETECTED Final   Staphylococcus aureus (BCID) NOT DETECTED NOT DETECTED Final   Staphylococcus epidermidis NOT DETECTED NOT DETECTED Final   Staphylococcus lugdunensis NOT DETECTED NOT DETECTED Final   Streptococcus species NOT DETECTED NOT DETECTED Final   Streptococcus agalactiae NOT DETECTED NOT DETECTED Final   Streptococcus pneumoniae NOT DETECTED NOT DETECTED Final   Streptococcus pyogenes NOT DETECTED NOT DETECTED Final   A.calcoaceticus-baumannii NOT DETECTED NOT DETECTED Final   Bacteroides fragilis NOT DETECTED NOT DETECTED Final   Enterobacterales NOT DETECTED NOT DETECTED Final   Enterobacter cloacae complex NOT DETECTED NOT DETECTED Final   Escherichia coli NOT DETECTED NOT DETECTED Final   Klebsiella aerogenes NOT DETECTED NOT DETECTED Final   Klebsiella oxytoca NOT DETECTED NOT DETECTED Final   Klebsiella pneumoniae NOT DETECTED NOT DETECTED Final   Proteus species NOT DETECTED NOT DETECTED Final   Salmonella species NOT DETECTED NOT DETECTED Final   Serratia marcescens NOT DETECTED NOT DETECTED Final   Haemophilus influenzae NOT DETECTED NOT DETECTED Final   Neisseria meningitidis NOT DETECTED NOT DETECTED Final   Pseudomonas aeruginosa NOT DETECTED NOT DETECTED Final   Stenotrophomonas maltophilia NOT DETECTED NOT DETECTED Final   Candida albicans NOT DETECTED NOT DETECTED Final   Candida auris NOT DETECTED NOT DETECTED Final   Candida glabrata NOT DETECTED NOT DETECTED Final   Candida krusei NOT DETECTED NOT DETECTED Final   Candida parapsilosis NOT DETECTED NOT DETECTED Final   Candida tropicalis NOT DETECTED NOT  DETECTED Final   Cryptococcus neoformans/gattii NOT DETECTED NOT DETECTED Final    Comment: Performed at [facility], [address] [place], [place] [address]  Culture, blood (routine x 2)     Status: None (Preliminary result)   Collection Time: 02/06/22 12:50 PM   Specimen: BLOOD  Result Value Ref Range Status   Specimen Description   Final    BLOOD SITE NOT SPECIFIED Performed at [facility], [address] [place], [place] [address]    Special Requests   Final    BOTTLES DRAWN AEROBIC AND ANAEROBIC Blood Culture adequate volume Performed at [facility], [address], [place], [place] [address]    Culture   Final    NO GROWTH < 24 HOURS Performed at [facility], [address], [place], [place] [address]    Report Status PENDING  Incomplete  Culture, blood (routine x 2)     Status: None (Preliminary result)   Collection Time: 02/06/22  3:09 PM   Specimen: BLOOD  Result Value Ref Range Status   Specimen Description   Final    BLOOD BLOOD RIGHT ARM Performed at [facility], [address], [place], [place] [address]    Special Requests   Final    BOTTLES DRAWN AEROBIC ONLY Blood Culture results may not be optimal due to an inadequate volume of blood received in culture bottles Performed at [facility], [address] [place], [place] [address]    Culture   Final    NO GROWTH < 24 HOURS Performed at [facility], [address] [place], [place] [address]    Report Status PENDING  Incomplete  Procedures/Studies: VAS US CAROTID  Result Date: 02/07/2022 Carotid Arterial Duplex Study Patient Name:  [name] Steele  Date of Exam:   02/07/2022 Medical Rec #: [id number]         Accession #:    [id number] Date of Birth: 06/08/1940          Patient Gender: M Patient Age:   7 years Exam Location:  [facility] Procedure:      VAS US CAROTID Referring Phys: Gentry  --------------------------------------------------------------------------------  Indications:       Syncope. Risk Factors:      Hypertension, Diabetes. Comparison Study:  No prior studies. Performing Technologist: Oliver Hum RVT  Examination Guidelines: A complete evaluation includes B-mode imaging, spectral Doppler, color Doppler, and power Doppler as needed of all accessible portions of each vessel. Bilateral testing is considered an integral part of a complete examination. Limited examinations for reoccurring indications may be performed as noted.  Right Carotid Findings: +----------+--------+--------+--------+-----------------------+--------+           PSV cm/sEDV cm/sStenosisPlaque Description     Comments +----------+--------+--------+--------+-----------------------+--------+ CCA Prox  49      11              smooth and heterogenous         +----------+--------+--------+--------+-----------------------+--------+ CCA Distal52      14              smooth and heterogenous         +----------+--------+--------+--------+-----------------------+--------+ ICA Prox  98      16              smooth and heterogenous         +----------+--------+--------+--------+-----------------------+--------+ ICA Distal54      22                                     tortuous +----------+--------+--------+--------+-----------------------+--------+ ECA       51      0                                               +----------+--------+--------+--------+-----------------------+--------+ +----------+--------+-------+--------+-------------------+           PSV cm/sEDV cmsDescribeArm Pressure (mmHG) +----------+--------+-------+--------+-------------------+ [id number]                                         +----------+--------+-------+--------+-------------------+ +---------+--------+--+--------+-+---------+ VertebralPSV cm/s24EDV cm/s6Antegrade  +---------+--------+--+--------+-+---------+  Left Carotid Findings: +----------+--------+--------+--------+-----------------------+--------+           PSV cm/sEDV cm/sStenosisPlaque Description     Comments +----------+--------+--------+--------+-----------------------+--------+ CCA Prox  74      14              smooth and heterogenous         +----------+--------+--------+--------+-----------------------+--------+ CCA Distal82      18              smooth and heterogenous         +----------+--------+--------+--------+-----------------------+--------+ ICA Prox  68      29              smooth and heterogenous         +----------+--------+--------+--------+-----------------------+--------+ ICA Distal69      24  tortuous +----------+--------+--------+--------+-----------------------+--------+ ECA       76      8                                               +----------+--------+--------+--------+-----------------------+--------+ +----------+--------+--------+--------+-------------------+           PSV cm/sEDV cm/sDescribeArm Pressure (mmHG) +----------+--------+--------+--------+-------------------+ [id number]                                          +----------+--------+--------+--------+-------------------+ +---------+--------+--+--------+--+---------+ VertebralPSV cm/s37EDV cm/s13Antegrade +---------+--------+--+--------+--+---------+   Summary: Right Carotid: Velocities in the right ICA are consistent with a 1-39% stenosis. Left Carotid: Velocities in the left ICA are consistent with a 1-39% stenosis. Vertebrals: Bilateral vertebral arteries demonstrate antegrade flow. *See table(s) above for measurements and observations.     Preliminary    ECHOCARDIOGRAM COMPLETE  Result Date: 02/07/2022    ECHOCARDIOGRAM REPORT   Patient Name:   [name] Steele Date of Exam: 02/07/2022 Medical Rec #:  [id number]        Height:        70.0 in Accession #:    [id number]       Weight:       157.0 lb Date of Birth:  [birth year]         BSA:          1.883 m Patient Age:    49 years         BP:           179/86 mmHg Patient Gender: M                HR:           63 bpm. Exam Location:  Inpatient Procedure: 2D Echo Indications:    syncope  History:        Patient has prior history of Echocardiogram examinations, most                 recent 10/14/2021. CAD, Abnormal ECG, Signs/Symptoms:Murmur; Risk                 Factors:Hypertension and Diabetes.  Sonographer:    Harvie Junior Referring Phys: [id number] [place]  Sonographer Comments: Technically difficult study due to poor echo windows and no subcostal window. Image acquisition challenging due to respiratory motion. IMPRESSIONS  1. Left ventricular ejection fraction, by estimation, is 60 to 65%. The left ventricle has normal function. The left ventricle has no regional wall motion abnormalities. There is mild left ventricular hypertrophy. Left ventricular diastolic parameters were normal.  2. Right ventricular systolic function is normal. The right ventricular size is normal. There is normal pulmonary artery systolic pressure.  3. The mitral valve is abnormal. No evidence of mitral valve regurgitation. No evidence of mitral stenosis.  4. Gradient stable since TTE 10/14/21. The aortic valve is tricuspid. There is moderate calcification of the aortic valve. There is moderate thickening of the aortic valve. Aortic valve regurgitation is not visualized. Mild aortic valve stenosis.  5. The inferior vena cava is normal in size with greater than 50% respiratory variability, suggesting right atrial pressure of 3 mmHg. FINDINGS  Left Ventricle: Left ventricular ejection fraction, by estimation, is 60 to 65%. The left ventricle has normal function. The left ventricle has no  regional wall motion abnormalities. The left ventricular internal cavity size was normal in size. There is  mild left ventricular  hypertrophy. Left ventricular diastolic parameters were normal. Right Ventricle: The right ventricular size is normal. No increase in right ventricular wall thickness. Right ventricular systolic function is normal. There is normal pulmonary artery systolic pressure. The tricuspid regurgitant velocity is 2.47 m/s, and  with an assumed right atrial pressure of 3 mmHg, the estimated right ventricular systolic pressure is 27.4 mmHg. Left Atrium: Left atrial size was normal in size. Right Atrium: Right atrial size was normal in size. Pericardium: There is no evidence of pericardial effusion. Mitral Valve: The mitral valve is abnormal. There is mild thickening of the mitral valve leaflet(s). There is mild calcification of the mitral valve leaflet(s). Mild mitral annular calcification. No evidence of mitral valve regurgitation. No evidence of mitral valve stenosis. Tricuspid Valve: The tricuspid valve is normal in structure. Tricuspid valve regurgitation is not demonstrated. No evidence of tricuspid stenosis. Aortic Valve: Gradient stable since TTE 10/14/21. The aortic valve is tricuspid. There is moderate calcification of the aortic valve. There is moderate thickening of the aortic valve. Aortic valve regurgitation is not visualized. Mild aortic stenosis is present. Aortic valve mean gradient measures 7.0 mmHg. Aortic valve peak gradient measures 12.4 mmHg. Aortic valve area, by VTI measures 1.71 cm. Pulmonic Valve: The pulmonic valve was normal in structure. Pulmonic valve regurgitation is not visualized. No evidence of pulmonic stenosis. Aorta: The aortic root is normal in size and structure. Venous: The inferior vena cava is normal in size with greater than 50% respiratory variability, suggesting right atrial pressure of 3 mmHg. IAS/Shunts: The interatrial septum was not well visualized.  LEFT VENTRICLE PLAX 2D LVIDd:         3.90 cm     Diastology LVIDs:         3.40 cm     LV e' medial:  8.05 cm/s LV PW:         1.10  cm     LV e' lateral: 8.27 cm/s LV IVS:        1.20 cm LVOT diam:     1.90 cm LV SV:         60 LV SV Index:   32 LVOT Area:     2.84 cm  LV Volumes (MOD) LV vol d, MOD A2C: 47.4 ml LV vol d, MOD A4C: 82.1 ml LV vol s, MOD A2C: 24.0 ml LV vol s, MOD A4C: 28.4 ml LV SV MOD A2C:     23.4 ml LV SV MOD A4C:     82.1 ml LV SV MOD BP:      37.4 ml RIGHT VENTRICLE RV Basal diam:  3.80 cm RV Mid diam:    3.30 cm RV S prime:     12.10 cm/s TAPSE (M-mode): 2.7 cm LEFT ATRIUM             Index        RIGHT ATRIUM           Index LA Vol (A2C):   43.0 ml 22.83 ml/m  RA Area:     11.00 cm LA Vol (A4C):   41.3 ml 21.93 ml/m  RA Volume:   19.80 ml  10.51 ml/m LA Biplane Vol: 43.0 ml 22.83 ml/m  AORTIC VALVE                     PULMONIC VALVE AV Area (Vmax):  1.44 cm      PV Vmax:       1.26 m/s AV Area (Vmean):   1.50 cm      PV Peak grad:  6.4 mmHg AV Area (VTI):     1.71 cm AV Vmax:           176.00 cm/s AV Vmean:          117.000 cm/s AV VTI:            0.348 m AV Peak Grad:      12.4 mmHg AV Mean Grad:      7.0 mmHg LVOT Vmax:         89.10 cm/s LVOT Vmean:        61.700 cm/s LVOT VTI:          0.210 m LVOT/AV VTI ratio: 0.60  AORTA Ao Root diam: 3.50 cm Ao Asc diam:  3.70 cm TRICUSPID VALVE TR Peak grad:   24.4 mmHg TR Vmax:        247.00 cm/s  SHUNTS Systemic VTI:  0.21 m Systemic Diam: 1.90 cm Jenkins Rouge MD Electronically signed by Jenkins Rouge MD Signature Date/Time: 02/07/2022/11:53:37 AM    Final    DG Chest Portable 1 View  Result Date: 02/06/2022 CLINICAL DATA:  Provided history: Near syncope. Additional history provided: History of diabetes and hypertension. Former smoker. EXAM: PORTABLE CHEST 1 VIEW COMPARISON:  Prior chest radiographs 12/15/2017 and earlier. Chest CT 12/15/2017. FINDINGS: Heart size at the upper limits of normal. Aortic atherosclerosis. No appreciable airspace consolidation or pulmonary edema. No evidence of pleural effusion or pneumothorax. Degenerative changes of the spine.  Redemonstrated chronic fracture deformity of the proximal left humerus. IMPRESSION: No evidence of acute cardiopulmonary abnormality. Aortic Atherosclerosis (ICD10-I70.0). Electronically Signed   By: Kellie Simmering D.O.   On: 02/06/2022 13:05   CT ABDOMEN PELVIS W CONTRAST  Result Date: 02/03/2022 CLINICAL DATA:  Right lower quadrant pain. EXAM: CT ABDOMEN AND PELVIS WITH CONTRAST TECHNIQUE: Multidetector CT imaging of the abdomen and pelvis was performed using the standard protocol following bolus administration of intravenous contrast. RADIATION DOSE REDUCTION: This exam was performed according to the departmental dose-optimization program which includes automated exposure control, adjustment of the mA and/or kV according to patient size and/or use of iterative reconstruction technique. CONTRAST:  160mL OMNIPAQUE IOHEXOL 300 MG/ML  SOLN COMPARISON:  Oct 06, 2008 FINDINGS: Lower chest: No acute abnormality. Hepatobiliary: No focal liver abnormality is seen. Status post cholecystectomy. Air is seen throughout the common bile duct with a mild amount of pneumobilia noted within the left lobe of the liver. Pancreas: Unremarkable. No pancreatic ductal dilatation or surrounding inflammatory changes. Spleen: Normal in size without focal abnormality. Adrenals/Urinary Tract: Diffuse enlargement of the lateral limb of the left adrenal gland is noted. The kidneys are mildly atrophic in size and lobulated in appearance. Multiple bilateral simple renal cysts are noted. There is no evidence of renal calculi or hydronephrosis. A 1.5 cm x 0.9 cm posterolateral urinary bladder diverticulum is seen on the right. Very mild diffuse urinary bladder wall thickening is seen. Stomach/Bowel: Stomach is within normal limits. Appendix appears normal. No evidence of bowel wall thickening, distention, or inflammatory changes. 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 No enlarged abdominal or pelvic lymph nodes. Reproductive: The prostate gland is mildly enlarged with marked severity diffuse prostate gland  calcification. Other: No abdominal wall hernia or abnormality. No abdominopelvic ascites. Musculoskeletal: Multilevel degenerative changes are seen throughout the lumbar spine. IMPRESSION: 1. Urinary bladder wall thickening which may represent sequelae associated with mild cystitis. Correlation with urinalysis is recommended. 2. 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 Recommend follow-up ultrasound every 3 years. This recommendation follows ACR consensus guidelines: White Paper of the ACR Incidental Findings Committee II on Vascular Findings. J Am Coll Radiol 2013; 10:789-794. 3. Multiple bilateral simple renal cysts (Bosniak 1). No additional follow-up or imaging is recommended. This recommendation follows ACR consensus guidelines: Management of the Incidental Renal Mass on CT: A White Paper of the ACR Incidental Findings Committee. J Am Coll Radiol 2018;15:264-273. 4. Status post cholecystectomy with associated pneumobilia. 5. Enlarged prostate gland with marked severity diffuse prostate gland calcification. Aortic Atherosclerosis (ICD10-I70.0). Electronically Signed   By: Virgina Norfolk M.D.   On: 02/03/2022 22:05    Labs: BNP (last 3 results) No results for input(s): "BNP" in the last 8760 hours. Basic Metabolic Panel: Recent Labs  Lab 02/03/22 1831 02/06/22 1225 02/07/22 0534  NA 137 142 142  K 3.6 3.4* 3.8  CL 100 105 112*  CO2 28 28 25   GLUCOSE 165* 114* 86  BUN 13 10 9   CREATININE 0.95 1.00 0.70  CALCIUM 9.0 9.0 8.0*   Liver Function Tests: Recent Labs  Lab 02/03/22 1831 02/06/22 1225  AST 26 26  ALT 15 18   ALKPHOS 58 52  BILITOT 0.9 0.6  PROT 6.5 6.5  ALBUMIN 3.4* 3.3*   Recent Labs  Lab 02/03/22 1831 02/06/22 1225  LIPASE 29 45   No results for input(s): "AMMONIA" in the last 168 hours. CBC: Recent Labs  Lab 02/03/22 1831 02/06/22 1225 02/07/22 0534  WBC 7.2 5.6 4.6  NEUTROABS 5.8  --   --   HGB 12.5* 12.7* 10.6*  HCT 39.5 39.7 33.8*  MCV 98.3 98.8 100.0  PLT 168 226 168   Cardiac Enzymes: No results for input(s): "CKTOTAL", "CKMB", "CKMBINDEX", "TROPONINI" in the last 168 hours. BNP: Invalid input(s): "POCBNP" CBG: Recent Labs  Lab 02/06/22 1212 02/07/22 0757 02/07/22 1206  GLUCAP 95 86 100*   D-Dimer No results for input(s): "DDIMER" in the last 72 hours. Hgb A1c Recent Labs    02/07/22 0534  HGBA1C 5.3   Lipid Profile No results for input(s): "CHOL", "HDL", "LDLCALC", "TRIG", "CHOLHDL", "LDLDIRECT" in the last 72 hours. Thyroid function studies No results for input(s): "TSH", "T4TOTAL", "T3FREE", "THYROIDAB" in the last 72 hours.  Invalid input(s): "FREET3" Anemia work up No results for input(s): "VITAMINB12", "FOLATE", "FERRITIN", "TIBC", "IRON", "RETICCTPCT" in the last 72 hours. Urinalysis    Component Value Date/Time   COLORURINE COLORLESS (A) 02/06/2022 1525   APPEARANCEUR CLEAR 02/06/2022 1525   LABSPEC 1.004 (L) 02/06/2022 1525   PHURINE 7.0 02/06/2022 1525   GLUCOSEU NEGATIVE 02/06/2022 1525   HGBUR NEGATIVE 02/06/2022 1525   BILIRUBINUR NEGATIVE 02/06/2022 1525   KETONESUR NEGATIVE 02/06/2022 1525   PROTEINUR NEGATIVE 02/06/2022 1525   UROBILINOGEN 0.2 10/06/2008 1745   NITRITE NEGATIVE 02/06/2022 1525   LEUKOCYTESUR LARGE (A) 02/06/2022 1525   Sepsis Labs Recent Labs  Lab 02/03/22 1831 02/06/22 1225 02/07/22 0534  WBC 7.2 5.6 4.6   Microbiology Recent Results (from the past 240 hour(s))  Urine Culture     Status: Abnormal   Collection Time:  02/03/22  9:12 PM   Specimen: Urine, Clean Catch  Result Value Ref Range Status    Specimen Description   Final    URINE, CLEAN CATCH Performed at [facility], [address] [place], [place] [address]    Special Requests   Final    NONE Performed at [facility], [place] [employer] [place], [place] [address]    Culture >=100,000 COLONIES/mL ESCHERICHIA COLI (A)  Final   Report Status 02/06/2022 FINAL  Final   Organism ID, Bacteria ESCHERICHIA COLI (A)  Final      Susceptibility   Escherichia coli - MIC*    AMPICILLIN <=2 SENSITIVE Sensitive     CEFAZOLIN <=4 SENSITIVE Sensitive     CEFEPIME <=0.12 SENSITIVE Sensitive     CEFTRIAXONE <=0.25 SENSITIVE Sensitive     CIPROFLOXACIN <=0.25 SENSITIVE Sensitive     GENTAMICIN <=1 SENSITIVE Sensitive     IMIPENEM <=0.25 SENSITIVE Sensitive     NITROFURANTOIN <=16 SENSITIVE Sensitive     TRIMETH/SULFA <=20 SENSITIVE Sensitive     AMPICILLIN/SULBACTAM <=2 SENSITIVE Sensitive     PIP/TAZO <=4 SENSITIVE Sensitive     * >=100,000 COLONIES/mL ESCHERICHIA COLI  Blood culture (routine x 2)     Status: Abnormal   Collection Time: 02/03/22  9:19 PM   Specimen: Right Antecubital; Blood  Result Value Ref Range Status   Specimen Description   Final    RIGHT ANTECUBITAL BLOOD Performed at [facility], [place] [address] [place], [place] [address]    Special Requests   Final    BOTTLES DRAWN AEROBIC AND ANAEROBIC Blood Culture adequate volume Performed at [facility], [address] [place], [place] [address]    Culture  Setup Time   Final    GRAM POSITIVE COCCI IN CLUSTERS ANAEROBIC BOTTLE ONLY Organism ID to follow CRITICAL RESULT CALLED TO, READ BACK BY AND VERIFIED WITH: RN L. Audelia Hives [phone] @2240  FH AEROBIC BOTTLE ONLY GRAM VARIABLE ROD CRITICAL RESULT CALLED TO, READ BACK BY AND VERIFIED WITH:  C/ L. Audelia Hives, RN 02/05/22 0441 A. LAFRANCE     Culture (A)  Final    STAPHYLOCOCCUS HOMINIS THE SIGNIFICANCE OF ISOLATING THIS ORGANISM FROM A SINGLE SET OF BLOOD CULTURES WHEN MULTIPLE  SETS ARE DRAWN IS UNCERTAIN. PLEASE NOTIFY THE MICROBIOLOGY DEPARTMENT WITHIN ONE WEEK IF SPECIATION AND SENSITIVITIES ARE REQUIRED. BACILLUS SPECIES Standardized susceptibility testing for this organism is not available. Performed at [facility], [place] [address] [place], [place] [address]    Report Status 02/06/2022 FINAL  Final  Blood Culture ID Panel (Reflexed)     Status: Abnormal   Collection Time: 02/03/22  9:19 PM  Result Value Ref Range Status   Enterococcus faecalis NOT DETECTED NOT DETECTED Final   Enterococcus Faecium NOT DETECTED NOT DETECTED Final   Listeria monocytogenes NOT DETECTED NOT DETECTED Final   Staphylococcus species DETECTED (A) NOT DETECTED Final    Comment: CRITICAL RESULT CALLED TO, READ BACK BY AND VERIFIED WITH: RN Ladell Pier [phone] @2242  FH    Staphylococcus aureus (BCID) NOT DETECTED NOT DETECTED Final   Staphylococcus epidermidis NOT DETECTED NOT DETECTED Final   Staphylococcus lugdunensis NOT DETECTED NOT DETECTED Final   Streptococcus species NOT DETECTED NOT DETECTED Final   Streptococcus agalactiae NOT DETECTED NOT DETECTED Final   Streptococcus pneumoniae NOT DETECTED NOT DETECTED Final   Streptococcus pyogenes NOT DETECTED NOT DETECTED Final   A.calcoaceticus-baumannii NOT DETECTED NOT DETECTED  Final   Bacteroides fragilis NOT DETECTED NOT DETECTED Final   Enterobacterales NOT DETECTED NOT DETECTED Final   Enterobacter cloacae complex NOT DETECTED NOT DETECTED Final   Escherichia coli NOT DETECTED NOT DETECTED Final   Klebsiella aerogenes NOT DETECTED NOT DETECTED Final   Klebsiella oxytoca NOT DETECTED NOT DETECTED Final   Klebsiella pneumoniae NOT DETECTED NOT DETECTED Final   Proteus species NOT DETECTED NOT DETECTED Final   Salmonella species NOT DETECTED NOT DETECTED Final   Serratia marcescens NOT DETECTED NOT DETECTED Final   Haemophilus influenzae NOT DETECTED NOT DETECTED Final   Neisseria meningitidis NOT DETECTED NOT DETECTED  Final   Pseudomonas aeruginosa NOT DETECTED NOT DETECTED Final   Stenotrophomonas maltophilia NOT DETECTED NOT DETECTED Final   Candida albicans NOT DETECTED NOT DETECTED Final   Candida auris NOT DETECTED NOT DETECTED Final   Candida glabrata NOT DETECTED NOT DETECTED Final   Candida krusei NOT DETECTED NOT DETECTED Final   Candida parapsilosis NOT DETECTED NOT DETECTED Final   Candida tropicalis NOT DETECTED NOT DETECTED Final   Cryptococcus neoformans/gattii NOT DETECTED NOT DETECTED Final    Comment: Performed at [facility], [place] [address] [place], [place] [address]  Blood Culture ID Panel (Reflexed)     Status: None   Collection Time: 02/05/22  3:20 AM  Result Value Ref Range Status   Enterococcus faecalis NOT DETECTED NOT DETECTED Final   Enterococcus Faecium NOT DETECTED NOT DETECTED Final   Listeria monocytogenes NOT DETECTED NOT DETECTED Final   Staphylococcus species NOT DETECTED NOT DETECTED Final   Staphylococcus aureus (BCID) NOT DETECTED NOT DETECTED Final   Staphylococcus epidermidis NOT DETECTED NOT DETECTED Final   Staphylococcus lugdunensis NOT DETECTED NOT DETECTED Final   Streptococcus species NOT DETECTED NOT DETECTED Final   Streptococcus agalactiae NOT DETECTED NOT DETECTED Final   Streptococcus pneumoniae NOT DETECTED NOT DETECTED Final   Streptococcus pyogenes NOT DETECTED NOT DETECTED Final   A.calcoaceticus-baumannii NOT DETECTED NOT DETECTED Final   Bacteroides fragilis NOT DETECTED NOT DETECTED Final   Enterobacterales NOT DETECTED NOT DETECTED Final   Enterobacter cloacae complex NOT DETECTED NOT DETECTED Final   Escherichia coli NOT DETECTED NOT DETECTED Final   Klebsiella aerogenes NOT DETECTED NOT DETECTED Final   Klebsiella oxytoca NOT DETECTED NOT DETECTED Final   Klebsiella pneumoniae NOT DETECTED NOT DETECTED Final   Proteus species NOT DETECTED NOT DETECTED Final   Salmonella species NOT DETECTED NOT DETECTED Final   Serratia  marcescens NOT DETECTED NOT DETECTED Final   Haemophilus influenzae NOT DETECTED NOT DETECTED Final   Neisseria meningitidis NOT DETECTED NOT DETECTED Final   Pseudomonas aeruginosa NOT DETECTED NOT DETECTED Final   Stenotrophomonas maltophilia NOT DETECTED NOT DETECTED Final   Candida albicans NOT DETECTED NOT DETECTED Final   Candida auris NOT DETECTED NOT DETECTED Final   Candida glabrata NOT DETECTED NOT DETECTED Final   Candida krusei NOT DETECTED NOT DETECTED Final   Candida parapsilosis NOT DETECTED NOT DETECTED Final   Candida tropicalis NOT DETECTED NOT DETECTED Final   Cryptococcus neoformans/gattii NOT DETECTED NOT DETECTED Final    Comment: Performed at [facility], [address], [place], [place] [address]  Culture, blood (routine x 2)     Status: None (Preliminary result)   Collection Time: 02/06/22 12:50 PM   Specimen: BLOOD  Result Value Ref Range Status   Specimen Description   Final    BLOOD SITE NOT SPECIFIED Performed at [facility],  [place] [address], [place], [place] [address]    Special Requests   Final    BOTTLES DRAWN AEROBIC AND ANAEROBIC Blood Culture adequate volume Performed at [place] [address] [place], [place] [address]    Culture   Final    NO GROWTH < 24 HOURS Performed at [facility] [address] [place], [place] [address]    Report Status PENDING  Incomplete  Culture, blood (routine x 2)     Status: None (Preliminary result)   Collection Time: 02/06/22  3:09 PM   Specimen: BLOOD  Result Value Ref Range Status   Specimen Description   Final    BLOOD BLOOD RIGHT ARM Performed at [place] [address], [place], [place] [address]    Special Requests   Final    BOTTLES DRAWN AEROBIC ONLY Blood Culture results may not be optimal due to an inadequate volume of blood received in culture bottles Performed at [place] [address], [place], [place] [address]    Culture   Final    NO GROWTH < 24 HOURS Performed at [place] [address], [place], [place] [address]    Report Status PENDING  Incomplete     Time coordinating discharge: 25 minutes  SIGNED: Antonieta Pert, MD  [facility] 02/07/2022, 3:00 PM  If 7PM-7AM, please contact night-coverage [email or web address]

## 2022-02-07 NOTE — Evaluation (Signed)
Physical Therapy Evaluation Patient Details Name: [name] Steele MRN: [id number] DOB: 12/03/1940 Today's Date: 02/07/2022  History of Present Illness  81 yo male presents to ED on 9/22 with RLQ pain x3 days, near-syncopal event in ED. Pt wit hrecent ED visit 9/19, dx with UTI, d/c home with antibiotics. PMH includes osteoarthritis, stage IIIa CKD,, urinary tract infection, recurrent acute UTIs, coronary artery calcification, type II DM, gout, interstitial lung disease, mild aortic stenosis and mild ascending dilatation, osteoporosis.   Clinical Impression  Pt presents with LE weakness, impaired higher level balance. Pt to benefit from acute PT to address deficits. Pt ambulated hallway distance, reports no pain today and does not require AD. Pt also without near syncopal symptoms, orthostatics taken earlier today. Per pt and wife, pt has become increasingly sedentary and pt with impaired balance and strength, recommending OPPT to address deficits. PT to progress mobility as tolerated, and will continue to follow acutely.         Recommendations for follow up therapy are one component of a multi-disciplinary discharge planning process, led by the attending physician.  Recommendations may be updated based on patient status, additional functional criteria and insurance authorization.  Follow Up Recommendations Outpatient PT (requests [facility])      Assistance Recommended at Discharge Set up Supervision/Assistance  Patient can return home with the following       Equipment Recommendations None recommended by PT  Recommendations for Other Services       Functional Status Assessment Patient has had a recent decline in their functional status and demonstrates the ability to make significant improvements in function in a reasonable and predictable amount of time.     Precautions / Restrictions Precautions Precautions: Fall Restrictions Weight Bearing Restrictions: No      Mobility   Bed Mobility Overal bed mobility: Modified Independent Bed Mobility: Supine to Sit     Supine to sit: Modified independent (Device/Increase time)          Transfers Overall transfer level: Needs assistance Equipment used: None Transfers: Sit to/from Stand Sit to Stand: Supervision           General transfer comment: when not using UEs to push up, pt requiring boost assist. When using UEs, pt powers up with supervision only    Ambulation/Gait Ambulation/Gait assistance: Supervision Gait Distance (Feet): 250 Feet Assistive device: None Gait Pattern/deviations: Step-through pattern, Decreased stride length, Trunk flexed, Shuffle Gait velocity: decr     General Gait Details: periods of decreased foot clearance and antalgic gait, R>L, per pt his feet have been hurting recently since going to chiropractor.  Stairs            Wheelchair Mobility    Modified Rankin (Stroke Patients Only)       Balance Overall balance assessment: Needs assistance   Sitting balance-Leahy Scale: Good Sitting balance - Comments: dons socks at EOB   Standing balance support: No upper extremity supported, During functional activity Standing balance-Leahy Scale: Fair                 High Level Balance Comments: Increased time for directional changes, gait is generally slow             Pertinent Vitals/Pain Pain Assessment Pain Assessment: Faces Faces Pain Scale: Hurts little more Pain Location: feet Pain Descriptors / Indicators: Sore, Discomfort Pain Intervention(s): Limited activity within patient's tolerance, Monitored during session, Repositioned    Home Living Family/patient expects to be discharged to:: Private residence Living Arrangements: Spouse/significant  other Available Help at Discharge: Family Type of Home: House Home Access: Level entry       Home Layout: One level Home Equipment: Conservation officer, nature (2 wheels);Cane - single point      Prior  Function Prior Level of Function : Independent/Modified Independent                     Hand Dominance   Dominant Hand: Right    Extremity/Trunk Assessment   Upper Extremity Assessment Upper Extremity Assessment: Defer to OT evaluation    Lower Extremity Assessment Lower Extremity Assessment: Generalized weakness    Cervical / Trunk Assessment Cervical / Trunk Assessment: Normal  Communication   Communication: HOH  Cognition Arousal/Alertness: Awake/alert Behavior During Therapy: WFL for tasks assessed/performed Overall Cognitive Status: Within Functional Limits for tasks assessed                                          General Comments      Exercises     Assessment/Plan    PT Assessment Patient needs continued PT services  PT Problem List Decreased strength;Decreased mobility;Decreased activity tolerance;Decreased balance       PT Treatment Interventions Therapeutic activities;Gait training;Therapeutic exercise;Patient/family education;Balance training;Stair training;Functional mobility training;Neuromuscular re-education    PT Goals (Current goals can be found in the Care Plan section)  Acute Rehab PT Goals PT Goal Formulation: With patient Time For Goal Achievement: 02/21/22 Potential to Achieve Goals: Good    Frequency Min 3X/week     Co-evaluation               AM-PAC PT "6 Clicks" Mobility  Outcome Measure Help needed turning from your back to your side while in a flat bed without using bedrails?: None Help needed moving from lying on your back to sitting on the side of a flat bed without using bedrails?: None Help needed moving to and from a bed to a chair (including a wheelchair)?: None Help needed standing up from a chair using your arms (e.g., wheelchair or bedside chair)?: None Help needed to walk in hospital room?: None Help needed climbing 3-5 steps with a railing? : A Little 6 Click Score: 23    End of  Session   Activity Tolerance: Patient tolerated treatment well;Patient limited by fatigue Patient left: in bed;with call bell/phone within reach;with bed alarm set;with family/visitor present Nurse Communication: Mobility status PT Visit Diagnosis: Other abnormalities of gait and mobility (R26.89);Muscle weakness (generalized) (M62.81)    Time: [id number] PT Time Calculation (min) (ACUTE ONLY): 21 min   Charges:   PT Evaluation $PT Eval Low Complexity: 1 Low        Sharanda Shinault S, PT DPT Acute Rehabilitation Services Pager [phone]  Office [phone]  Roxine Caddy E Ruffin Pyo 02/07/2022, 1:59 PM

## 2022-02-11 DIAGNOSIS — I714 Abdominal aortic aneurysm, without rupture, unspecified: Secondary | ICD-10-CM | POA: Diagnosis not present

## 2022-02-11 DIAGNOSIS — E876 Hypokalemia: Secondary | ICD-10-CM | POA: Diagnosis not present

## 2022-02-11 DIAGNOSIS — N3000 Acute cystitis without hematuria: Secondary | ICD-10-CM | POA: Diagnosis not present

## 2022-02-11 DIAGNOSIS — R5381 Other malaise: Secondary | ICD-10-CM | POA: Diagnosis not present

## 2022-02-11 DIAGNOSIS — I6523 Occlusion and stenosis of bilateral carotid arteries: Secondary | ICD-10-CM | POA: Diagnosis not present

## 2022-02-11 LAB — CULTURE, BLOOD (ROUTINE X 2)
Culture: NO GROWTH
Culture: NO GROWTH
Special Requests: ADEQUATE

## 2022-02-13 ENCOUNTER — Other Ambulatory Visit: Payer: Medicare Other

## 2022-02-13 ENCOUNTER — Encounter: Payer: Self-pay | Admitting: Cardiology

## 2022-02-13 ENCOUNTER — Ambulatory Visit: Payer: PRIVATE HEALTH INSURANCE | Attending: Cardiology | Admitting: Cardiology

## 2022-02-13 VITALS — BP 170/70 | HR 64 | Ht 70.0 in | Wt 153.0 lb

## 2022-02-13 DIAGNOSIS — I350 Nonrheumatic aortic (valve) stenosis: Secondary | ICD-10-CM

## 2022-02-13 DIAGNOSIS — R55 Syncope and collapse: Secondary | ICD-10-CM | POA: Diagnosis not present

## 2022-02-13 DIAGNOSIS — I25119 Atherosclerotic heart disease of native coronary artery with unspecified angina pectoris: Secondary | ICD-10-CM

## 2022-02-13 DIAGNOSIS — I2510 Atherosclerotic heart disease of native coronary artery without angina pectoris: Secondary | ICD-10-CM

## 2022-02-13 IMAGING — CT CT CARDIAC CORONARY ARTERY CALCIUM SCORE
3 series · 14 of 20 positions shown, 16 images · non-contrast
Comparison: CT a of the chest on 12/15/2017

CLINICAL DATA: 80-year-old Caucasian male with history of diabetes
and hypertension.

EXAM:
CT CARDIAC CORONARY ARTERY CALCIUM SCORE
TECHNIQUE: Non-contrast imaging through the heart was performed using
prospective ECG gating. Image post processing was performed on an
independent workstation, allowing for quantitative analysis of the
heart and coronary arteries. Note that this exam targets the heart
and the chest was not imaged in its entirety.

[Series 2: calcium scoring 2.00 qr36 bestdiast 70% hrt calciu · axial · 0.44mm/px · z∈[+1508,+1588]mm · 4 of 68 slices shown]
[im 14/68  vessel]
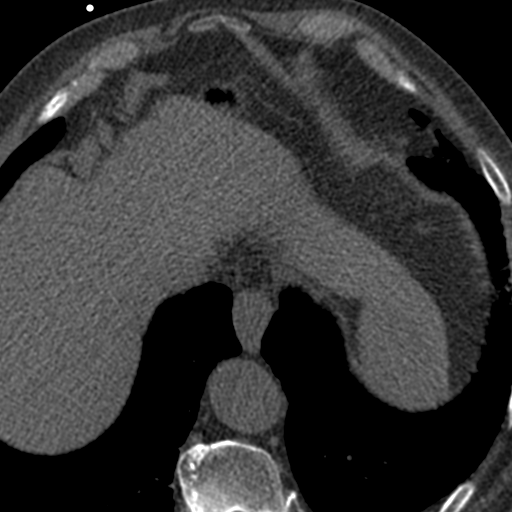
[im 27/68  vessel]
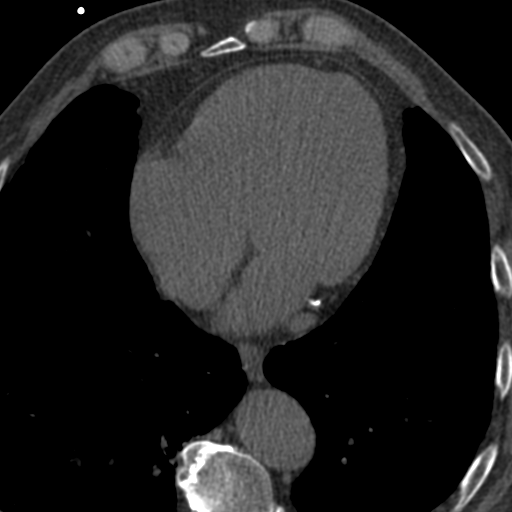
[im 41/68  vessel]
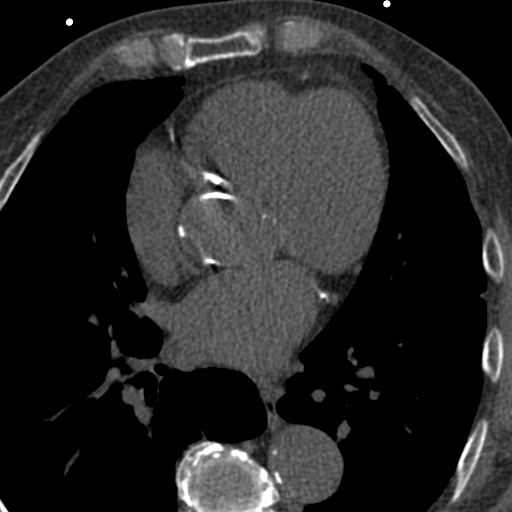
[im 54/68  vessel]
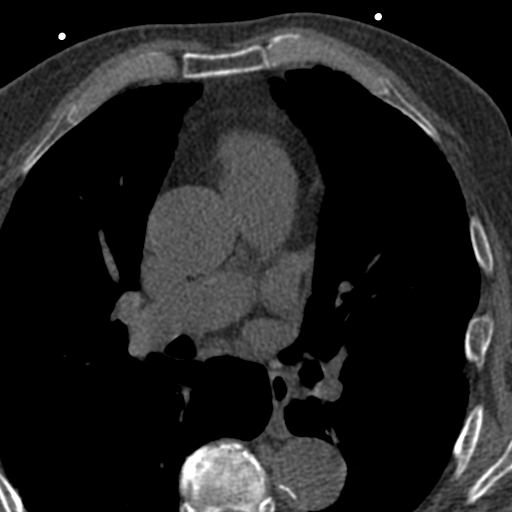

[Series 3: calcium scoring 2.00 br40 bestdiast 70% axial · axial · 0.59mm/px · z∈[+1504,+1592]mm · 5 of 68 slices shown, 7 images]
[im 12/68  vessel]
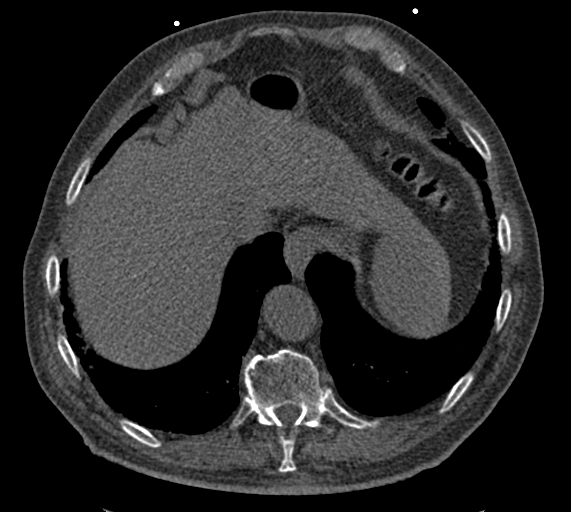
[im 12/68  lung]
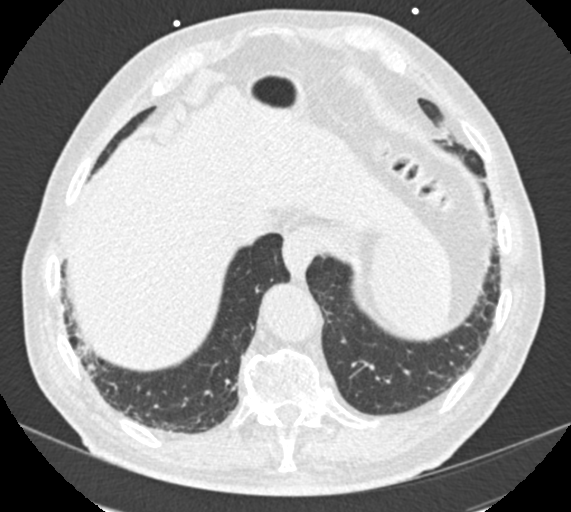
[im 23/68  vessel]
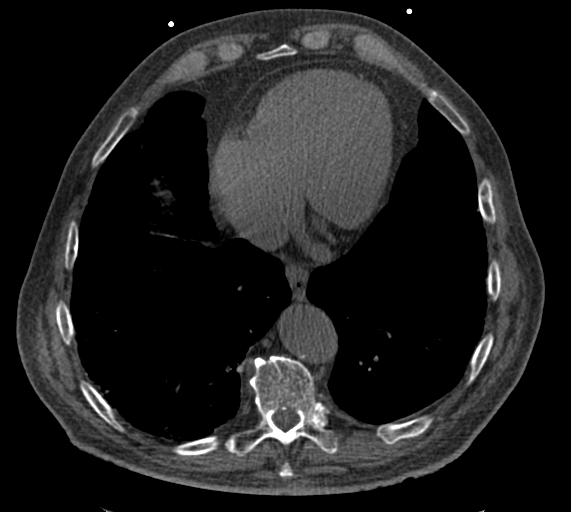
[im 34/68  vessel]
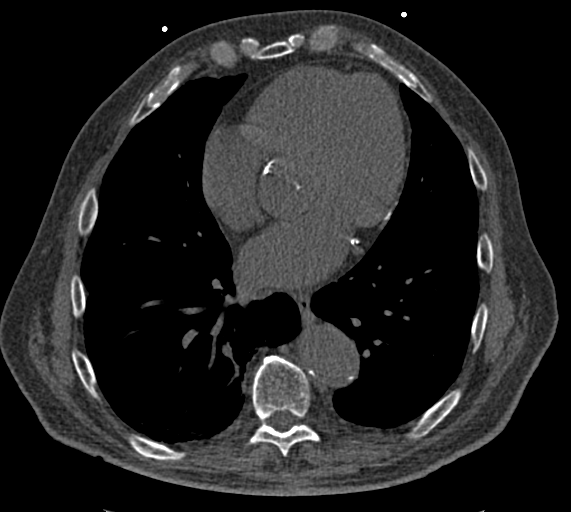
[im 45/68  vessel]
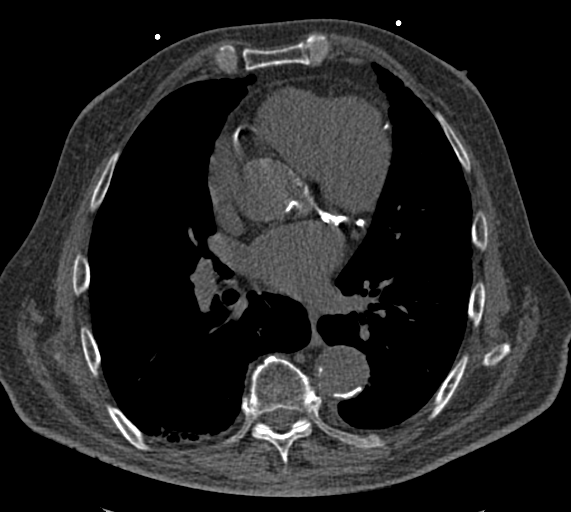
[im 56/68  vessel]
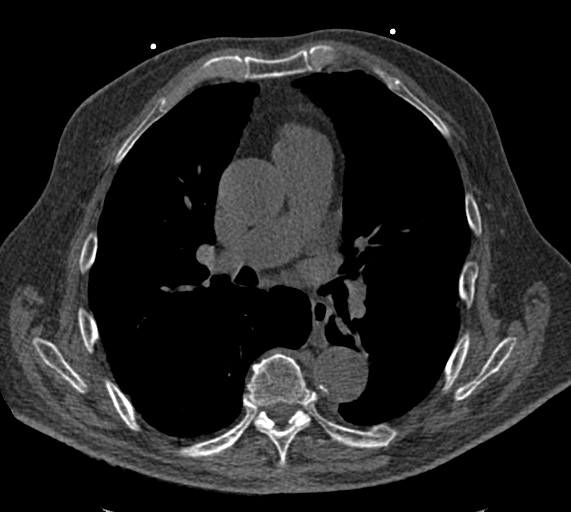
[im 56/68  lung]
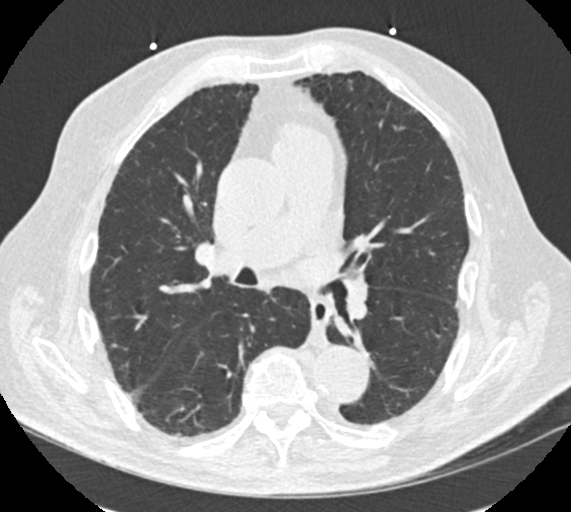

[Series 9: calcium scoring 2.00 br60 bestdiast 70% lungs · axial · 0.59mm/px · z∈[+1504,+1592]mm · 5 of 68 slices shown]
[im 12/68  vessel]
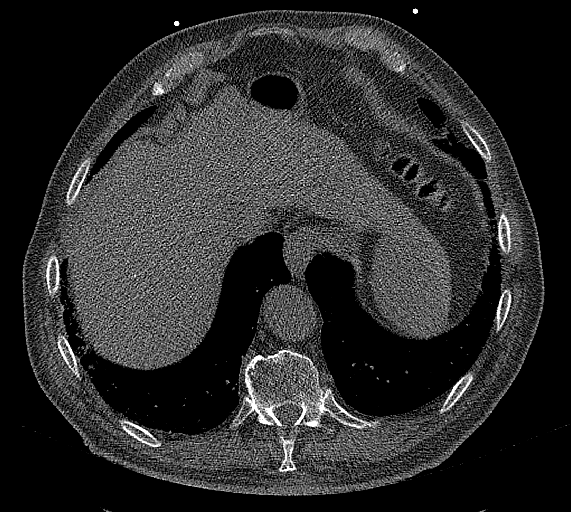
[im 23/68  vessel]
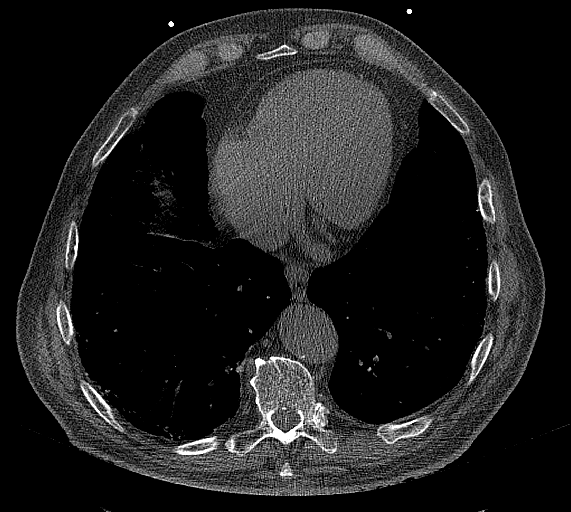
[im 34/68  vessel]
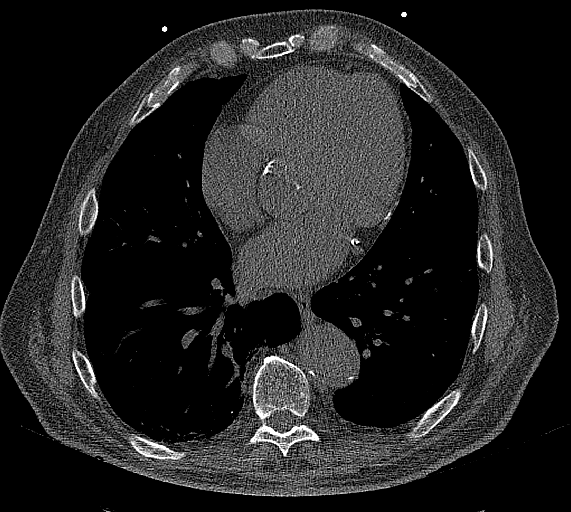
[im 45/68  vessel]
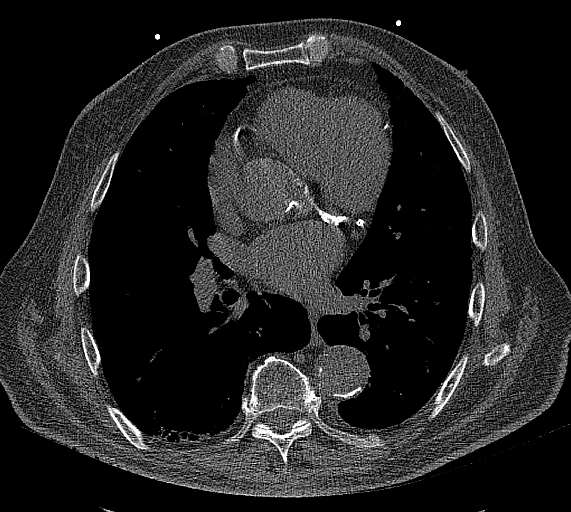
[im 56/68  vessel]
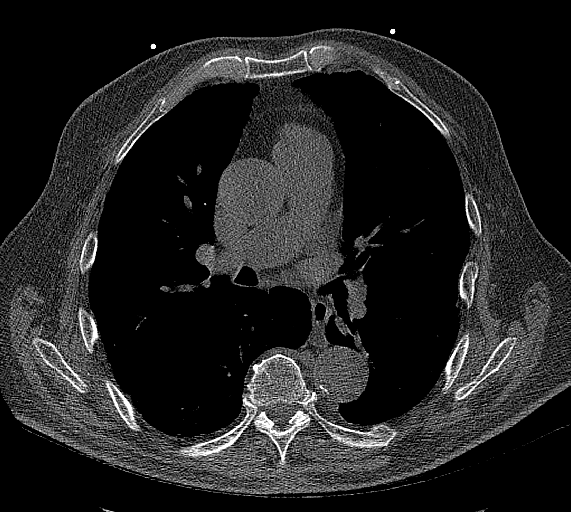

[14 of 20 positions shown; findings below may reference images not displayed]

FINDINGS: CORONARY CALCIUM SCORES:

Left Main: 0

LAD: 4,143

LCx: 0

RCA: 2,087

Total Agatston Score: 6,230

The calcium score is nearly impossible to accurately quantify due to
contiguous nature of calcium with adjacent calcified aortic plaque
as well. Calcium detection software is unable to separate some of
the calcifications and the LAD score also includes the left
circumflex.

[HOSPITAL] percentile: 97

AORTA MEASUREMENTS:

Ascending Aorta: 37 mm

Descending Aorta: 31 mm

OTHER FINDINGS:

The heart size is within normal limits. No pericardial fluid is
identified. Visualized segments of the thoracic aorta and central
pulmonary arteries are normal in caliber. Visualized mediastinum and
hilar regions demonstrate no lymphadenopathy or masses. Progression
subpleural interstitial thickening with potential early fibrotic
disease at the lung bases. Visualized lungs show no evidence of
pulmonary edema, consolidation, pneumothorax, nodule or pleural
fluid. Visualized upper abdomen and bony structures are
unremarkable.
IMPRESSION: 1. Extensive calcified plaque in the distribution of the coronary
arteries. Exact quantification is not entirely accurate due to
contiguous nature of plaque extending throughout the coronary artery
tree which also included calculation of some adjacent plaque in the
proximal thoracic aorta. Calculated score of 6,230 is at the 97th
percentile for the patient's age, sex and race.
2. Evidence of chronic interstitial lung disease which shows
progression since a prior CT in 9662 with evidence of possible early
pulmonary fibrosis at the lung bases. Correlation suggested
clinically with any respiratory symptoms or prior pulmonary workup.

## 2022-02-13 NOTE — Patient Instructions (Signed)
Medication Instructions:  The current medical regimen is effective;  continue present plan and medications.  *If you need a refill on your cardiac medications before your next appointment, please call your pharmacy*  Follow-Up: At [facility], you and your health needs are our priority.  As part of our continuing mission to provide you with exceptional heart care, we have created designated Provider Care Teams.  These Care Teams include your primary Cardiologist (physician) and Advanced Practice Providers (APPs -  Physician Assistants and Nurse Practitioners) who all work together to provide you with the care you need, when you need it.  We recommend signing up for the patient portal called "MyChart".  Sign up information is provided on this After Visit Summary.  MyChart is used to connect with patients for Virtual Visits (Telemedicine).  Patients are able to view lab/test results, encounter notes, upcoming appointments, etc.  Non-urgent messages can be sent to your provider as well.   To learn more about what you can do with MyChart, go to [email or web address].    Your next appointment:   6 month(s)  The format for your next appointment:   In Person  Provider:   Mark Skains, MD      Important Information About Sugar       

## 2022-02-13 NOTE — Progress Notes (Signed)
Cardiology Office Note:    Date:  02/13/2022   ID:  [name] Steele, DOB 29-Dec-1940, MRN [id number]  PCP:  Deland Pretty, MD   [facility] Providers Cardiologist:  Candee Furbish, MD     Referring MD: Deland Pretty, MD   History of Present Illness:    [name] Steele is a 81 y.o. male here for the follow up for the hospital follow up for the vasovagal episode.   He was admitted to the hospital on 02/06/2022 for a vasovagal episode. He was seen 3 before at [facility] due to dysuria, right flank and RLQ pain.  Patient was admitted for near syncope kept on IV fluids beta-blocker held BP medication held echo and Doppler ordered.  Recent urine culture grew E. coli placed on ceftriaxone, potassium replaced. Echo completed EF 60 to 65%, mild LVH, no RWMA moderate calcification of the aortic valve moderate thickening of the aortic valve mild aortic valve stenosis. carotid ultrasound also done and 1-39% stenosis b/l ,stable. Etiology, of his vasovagal episode was unclear. Oral hydration was encouraged, he was discharged with stable condition, and with an outpatient follow with PCP.   Previously he was here for the evaluation of coronary artery disease at the request of Dr. Shelia Media.   02/28/2021 Notes from Dr. Shelia Media reviewed: Total agatson score was 6,230, 97th percentile noted on calcium score 01/2021, so he was referred to cardiology.  He started 81 mg aspirin after his last PCP visit.  Former smoker, he quit smoking in 1990.  No known family history of heart attacks.  He continued to suffer from bilateral LE edema.   For activity, he often worked outside and raked leaves. He denied any symptoms. Since his early retirement, he had been struggling with gradually decreasing energy levels and increasing physical limitation due to his health issues.  Today:   He is accompanied by a family member who also helps with the history of the patient.   We reviewed his hospitalization in  detail.  He states that now, after the ED visit, he is consuming more fluids. He also has minor amounts of edema in his bilateral LE. He has notices that it is not as severe now.  He is taking Lasix as needed for the swelling.   His blood pressure in the clinic is 170/70. He also states that he doesn't get up until after 11 AM, so he usually takes his medication around 11:30 AM.   His historian states that his calcium was checked two days ago.   His historian also states that he can't get up easily, typically he would be able to get up with the help of armrests if available. His physical therapist checked on him before the ED visit.   He denies any palpitations, chest pain, shortness of breath. No headaches, orthopnea, or PND. Extensive review of hospital notes reviewed.  Data, echocardiogram, EKG etc.  Past Medical History:  Diagnosis Date   Arthritis    Chronic kidney disease, stage 3a ([place])    Chronic urinary tract infection    Coronary artery calcification seen on CT scan    Diabetes mellitus without complication (HCC)    Gout    Hypertension    ILD (interstitial lung disease) ([place])    by CT 01/2021   Mild aortic stenosis    Mild dilation of ascending aorta (HCC)    Osteoporosis     Past Surgical History:  Procedure Laterality Date   CHOLECYSTECTOMY N/A 07/04/2018   Procedure:  LAPAROSCOPIC CHOLECYSTECTOMY WITH INTRAOPERATIVE CHOLANGIOGRAM;  Surgeon: Coralie Keens, MD;  Location: [facility];  Service: General;  Laterality: N/A;   ERCP N/A 07/05/2018   Procedure: ENDOSCOPIC RETROGRADE CHOLANGIOPANCREATOGRAPHY (ERCP);  Surgeon: Clarene Essex, MD;  Location: [facility];  Service: Endoscopy;  Laterality: N/A;   JOINT REPLACEMENT Bilateral    REMOVAL OF STONES  07/05/2018   Procedure: REMOVAL OF STONES;  Surgeon: Clarene Essex, MD;  Location: [facility]OS[facility];  Service: Endoscopy;;   SPHINCTEROTOMY  07/05/2018   Procedure: Joan Mayans;  Surgeon: Clarene Essex, MD;  Location: [facility];   Service: Endoscopy;;    Current Medications: Current Meds  Medication Sig   aspirin EC 81 MG tablet Take 1 tablet (81 mg total) by mouth daily. Swallow whole.   carvedilol (COREG) 6.25 MG tablet Take 1 tablet (6.25 mg total) by mouth 2 (two) times daily.   cefadroxil (DURICEF) 500 MG capsule Take 2 capsules (1,000 mg total) by mouth 2 (two) times daily for 10 days.   colchicine 0.6 MG tablet Take 0.6 mg by mouth daily as needed (gout).    CRANBERRY PO Take 1 capsule by mouth daily.   furosemide (LASIX) 20 MG tablet Take 1 tablet (20 mg total) by mouth daily as needed.   ipratropium (ATROVENT) 0.06 % nasal spray SMARTSIG:2 Spray(s) Both Nares 3 Times Daily PRN   metFORMIN (GLUCOPHAGE-XR) 750 MG 24 hr tablet Take 750 mg by mouth daily after breakfast.   rosuvastatin (CRESTOR) 10 MG tablet Take 1 tablet (10 mg total) by mouth daily.   telmisartan (MICARDIS) 80 MG tablet Take 80 mg by mouth daily.     Allergies:   Erythromycin, Penicillin g, and Penicillins   Social History   Socioeconomic History   Marital status: Married    Spouse name: Not on file   Number of children: Not on file   Years of education: Not on file   Highest education level: Not on file  Occupational History   Not on file  Tobacco Use   Smoking status: Former    Packs/day: 1.00    Years: 31.00    Total pack years: 31.00    Types: Cigarettes    Quit date: 23    Years since quitting: 35.7   Smokeless tobacco: Never  Vaping Use   Vaping Use: Never used  Substance and Sexual Activity   Alcohol use: Yes    Alcohol/week: 0.0 standard drinks of alcohol    Comment: social   Drug use: No   Sexual activity: Not Currently  Other Topics Concern   Not on file  Social History Narrative   Not on file   Social Determinants of Health   Financial Resource Strain: Not on file  Food Insecurity: No Food Insecurity (02/06/2022)   Hunger Vital Sign    Worried About Running Out of Food in the Last Year: Never true     Ran Out of Food in the Last Year: Never true  Transportation Needs: No Transportation Needs (02/06/2022)   PRAPARE - Hydrologist (Medical): No    Lack of Transportation (Non-Medical): No  Physical Activity: Not on file  Stress: Not on file  Social Connections: Not on file     Family History: The patient's family history includes Heart disease in his sister.  ROS:   Please see the history of present illness.    (+) Recent presyncopal episode All other systems reviewed and are negative.  EKGs/Labs/Other Studies Reviewed:    The following  studies were reviewed today:  [place] Bilateral Carotid 02/07/2022:  Summary:  Right Carotid: Velocities in the right ICA are consistent with a 1-39%  stenosis.   Left Carotid: Velocities in the left ICA are consistent with a 1-39%  stenosis.   Vertebrals: Bilateral vertebral arteries demonstrate antegrade flow.   Echo 02/07/2022:   IMPRESSIONS    1. Left ventricular ejection fraction, by estimation, is 60 to 65%. The  left ventricle has normal function. The left ventricle has no regional  wall motion abnormalities. There is mild left ventricular hypertrophy.  Left ventricular diastolic parameters  were normal.   2. Right ventricular systolic function is normal. The right ventricular  size is normal. There is normal pulmonary artery systolic pressure.   3. The mitral valve is abnormal. No evidence of mitral valve  regurgitation. No evidence of mitral stenosis.   4. Gradient stable since TTE 10/14/21. The aortic valve is tricuspid.  There is moderate calcification of the aortic valve. There is moderate  thickening of the aortic valve. Aortic valve regurgitation is not  visualized. Mild aortic valve stenosis.   5. The inferior vena cava is normal in size with greater than 50%  respiratory variability, suggesting right atrial pressure of 3 mmHg.  Nuclear Stress Test 05/09/2021:     Findings are consistent with  ischemia. The study is low risk.   No ST deviation was noted.   Left ventricular function is normal. Nuclear stress EF: 68 %. The left ventricular ejection fraction is hyperdynamic (>65%). End diastolic cavity size is normal. End systolic cavity size is normal.   Prior study not available for comparison.   Findings: Occasional PVCs in recovery. No evidence of infarction. There is evidence of apical ischemia, small territory (< 10% of LV myocardium).    Conclusions: Stress test is positive. Low risk study based on small defect.  CT/Calcium Score 02/11/2021: COMPARISON:  CT a of the chest on 12/15/2017   FINDINGS: CORONARY CALCIUM SCORES:   Left Main: 0   LAD: 4,143   LCx: 0   RCA: 2,087   Total Agatston Score: 6,230   The calcium score is nearly impossible to accurately quantify due to contiguous nature of calcium with adjacent calcified aortic plaque as well. Calcium detection software is unable to separate some of the calcifications and the LAD score also includes the left circumflex.   MESA database percentile: 97   AORTA MEASUREMENTS:   Ascending Aorta: 37 mm   Descending Aorta: 31 mm   OTHER FINDINGS:   The heart size is within normal limits. No pericardial fluid is identified. Visualized segments of the thoracic aorta and central pulmonary arteries are normal in caliber. Visualized mediastinum and hilar regions demonstrate no lymphadenopathy or masses. Progression subpleural interstitial thickening with potential early fibrotic disease at the lung bases. Visualized lungs show no evidence of pulmonary edema, consolidation, pneumothorax, nodule or pleural fluid. Visualized upper abdomen and bony structures are unremarkable.   IMPRESSION: 1. Extensive calcified plaque in the distribution of the coronary arteries. Exact quantification is not entirely accurate due to contiguous nature of plaque extending throughout the coronary artery tree which also  included calculation of some adjacent plaque in the proximal thoracic aorta. Calculated score of 6,230 is at the 97th percentile for the patient's age, sex and race. 2. Evidence of chronic interstitial lung disease which shows progression since a prior CT in 2019 with evidence of possible early pulmonary fibrosis at the lung bases. Correlation suggested clinically with any respiratory symptoms  or prior pulmonary workup.  EKG:  EKG is personally reviewed and interpreted. 02/13/2022: EKG was not ordered. 04/14/2021: Sinus rhythm. Rate 74 bpm. Left axis deviation.  Recent Labs: 10/14/2021: TSH 1.350 02/06/2022: ALT 18 02/07/2022: BUN 9; Creatinine, Ser 0.70; Hemoglobin 10.6; Platelets 168; Potassium 3.8; Sodium 142   Recent Lipid Panel    Component Value Date/Time   CHOL 93 (L) 07/15/2021 1029   TRIG 70 07/15/2021 1029   HDL 46 07/15/2021 1029   CHOLHDL 2.0 07/15/2021 1029   LDLCALC 32 07/15/2021 1029     Risk Assessment/Calculations:          Physical Exam:    VS:  BP (!) 170/70 (BP Location: Left Arm, Patient Position: Sitting, Cuff Size: Normal)   Pulse 64   Ht 5\' 10"  (1.778 m)   Wt 153 lb (69.4 kg)   SpO2 96%   BMI 21.95 kg/m     Wt Readings from Last 3 Encounters:  02/13/22 153 lb (69.4 kg)  02/06/22 157 lb (71.2 kg)  02/03/22 157 lb (71.2 kg)     GEN: Well nourished, well developed in no acute distress HEENT: Normal NECK: No JVD; No carotid bruits LYMPHATICS: No lymphadenopathy CARDIAC: RRR, 2/6 holosystolic murmur at the apex, no rubs, no gallops RESPIRATORY:  Clear to auscultation without rales, wheezing or rhonchi  ABDOMEN: Soft, non-tender, non-distended MUSCULOSKELETAL:  3+ bilateral LE edema; No deformity  SKIN: Warm and dry NEUROLOGIC:  Alert and oriented x 3 PSYCHIATRIC:  Normal affect   ASSESSMENT:    1. Vasovagal syncope   2. Mild aortic stenosis   3. Coronary artery disease involving native coronary artery of native heart with angina pectoris  ([place])     PLAN:    In order of problems listed above:  Vasovagal syncope - Agree that this is the most logical diagnosis for him.  He has labile blood pressures.  I am willing to tolerate a higher than normal blood pressure given his prior syncopal episode.  Continue to hydrate well.  TED hose.  They have zipper up type.  He knows to sit down if he starts to feel the prodrome.  If syncope is recurrent and it does not seem to have typical prodrome, we will place him on a ZIO monitor.   Coronary artery disease involving native coronary artery of native heart with angina pectoris (HCC) -High coronary calcium score as listed above.  Continue with goal-directed medical therapy   Heart murmur Mild aortic stenosis.   Nonspecific abnormal electrocardiogram (ECG) (EKG) Left axis deviation noted sinus rhythm 74.   Shared Decision Making/Informed Consent The risks [chest pain, shortness of breath, cardiac arrhythmias, dizziness, blood pressure fluctuations, myocardial infarction, stroke/transient ischemic attack, nausea, vomiting, allergic reaction, radiation exposure, metallic taste sensation and life-threatening complications (estimated to be 1 in 10,000)], benefits (risk stratification, diagnosing coronary artery disease, treatment guidance) and alternatives of a nuclear stress test were discussed in detail with Mr. Becken and he agrees to proceed.   Follow-up: 6 months.  Medication Adjustments/Labs and Tests Ordered: Current medicines are reviewed at length with the patient today.  Concerns regarding medicines are outlined above.   No orders of the defined types were placed in this encounter.   No orders of the defined types were placed in this encounter.   Patient Instructions  Medication Instructions:  The current medical regimen is effective;  continue present plan and medications.  *If you need a refill on your cardiac medications before your next appointment, please call  your  pharmacy*  Follow-Up: At [facility], you and your health needs are our priority.  As part of our continuing mission to provide you with exceptional heart care, we have created designated Provider Care Teams.  These Care Teams include your primary Cardiologist (physician) and Advanced Practice Providers (APPs -  Physician Assistants and Nurse Practitioners) who all work together to provide you with the care you need, when you need it.  We recommend signing up for the patient portal called "MyChart".  Sign up information is provided on this After Visit Summary.  MyChart is used to connect with patients for Virtual Visits (Telemedicine).  Patients are able to view lab/test results, encounter notes, upcoming appointments, etc.  Non-urgent messages can be sent to your provider as well.   To learn more about what you can do with MyChart, go to [email or web address].    Your next appointment:   6 month(s)  The format for your next appointment:   In Person  Provider:   Candee Furbish, MD      Important Information About Sugar         I,Mathew Stumpf,acting as a scribe for Candee Furbish, MD.,have documented all relevant documentation on the behalf of Candee Furbish, MD,as directed by  Candee Furbish, MD while in the presence of Candee Furbish, MD.  I, Candee Furbish, MD, have reviewed all documentation for this visit. The documentation on 02/13/22 for the exam, diagnosis, procedures, and orders are all accurate and complete.   Signed, Candee Furbish, MD  02/13/2022 2:42 PM     [facility]

## 2022-03-05 ENCOUNTER — Other Ambulatory Visit: Payer: Self-pay

## 2022-03-05 ENCOUNTER — Ambulatory Visit (HOSPITAL_BASED_OUTPATIENT_CLINIC_OR_DEPARTMENT_OTHER): Payer: Medicare Other | Attending: Internal Medicine | Admitting: Physical Therapy

## 2022-03-05 ENCOUNTER — Encounter (HOSPITAL_BASED_OUTPATIENT_CLINIC_OR_DEPARTMENT_OTHER): Payer: Self-pay | Admitting: Physical Therapy

## 2022-03-05 DIAGNOSIS — M6281 Muscle weakness (generalized): Secondary | ICD-10-CM | POA: Diagnosis not present

## 2022-03-05 DIAGNOSIS — R5381 Other malaise: Secondary | ICD-10-CM | POA: Diagnosis not present

## 2022-03-05 DIAGNOSIS — R262 Difficulty in walking, not elsewhere classified: Secondary | ICD-10-CM | POA: Insufficient documentation

## 2022-03-05 NOTE — Therapy (Signed)
OUTPATIENT PHYSICAL THERAPY LOWER EXTREMITY EVALUATION   Patient Name: [name] Steele MRN: [id number] DOB:Dec 03, 1940, 81 y.o., male Today's Date: 03/05/2022   PT End of Session - 03/05/22 1358     Visit Number 1    Number of Visits 18    Date for PT Re-Evaluation 06/03/22    Authorization Type Medical Care/[employer]    PT Start Time [id number]    PT Stop Time 1430    PT Time Calculation (min) 45 min    Activity Tolerance Patient tolerated treatment well    Behavior During Therapy WFL for tasks assessed/performed             Past Medical History:  Diagnosis Date   Arthritis    Chronic kidney disease, stage 3a ([place])    Chronic urinary tract infection    Coronary artery calcification seen on CT scan    Diabetes mellitus without complication ([place])    Gout    Hypertension    ILD (interstitial lung disease) ([place])    by CT 01/2021   Mild aortic stenosis    Mild dilation of ascending aorta ([place])    Osteoporosis    Past Surgical History:  Procedure Laterality Date   CHOLECYSTECTOMY N/A 07/04/2018   Procedure: LAPAROSCOPIC CHOLECYSTECTOMY WITH INTRAOPERATIVE CHOLANGIOGRAM;  Surgeon: Coralie Keens, MD;  Location: [facility];  Service: General;  Laterality: N/A;   ERCP N/A 07/05/2018   Procedure: ENDOSCOPIC RETROGRADE CHOLANGIOPANCREATOGRAPHY (ERCP);  Surgeon: Clarene Essex, MD;  Location: [facility];  Service: Endoscopy;  Laterality: N/A;   JOINT REPLACEMENT Bilateral    REMOVAL OF STONES  07/05/2018   Procedure: REMOVAL OF STONES;  Surgeon: Clarene Essex, MD;  Location: [facility];  Service: Endoscopy;;   SPHINCTEROTOMY  07/05/2018   Procedure: Joan Mayans;  Surgeon: Clarene Essex, MD;  [facility]: [facility];  Service: Endoscopy;;   Patient Active Problem List   Diagnosis Date Noted   Near syncope 02/06/2022   E. coli UTI 02/06/2022   Infrarenal abdominal aortic aneurysm (AAA) without rupture ([place]) 02/06/2022   Hypokalemia 02/06/2022   Normocytic anemia 02/06/2022    Coronary artery disease involving native coronary artery of native heart with angina pectoris ([place]) 04/14/2021   Heart murmur 04/14/2021   Nonspecific abnormal electrocardiogram (ECG) (EKG) 04/14/2021   Lower extremity edema 04/14/2021   Acute cholecystitis 07/04/2018   E coli bacteremia 04/11/2017   UTI (urinary tract infection) 04/10/2017   Sepsis ([place]) 04/10/2017   Type 2 diabetes mellitus without complication ([place]) [id number]   Essential hypertension 04/10/2017    PCP: Deland Pretty, MD  REFERRING PROVIDER: Deland Pretty, MD  REFERRING DIAG: R53.81 (ICD-10-CM) - Other malaise  THERAPY DIAG:  Muscle weakness (generalized)  Difficulty walking  Rationale for Evaluation and Treatment Rehabilitation  ONSET DATE: 2023  SUBJECTIVE:   SUBJECTIVE STATEMENT:  Goes by [name]   Pt presents with wife today to session. 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   Pt states his main concern is strength and mobility. Pt states he has been retired since 1973 and states his R knee began to slow down his activity. He finally got it replaced but learned the habit of getting up and down with the assist of UE. He feels that he has a lot of trouble getting out of chairs without UE and feels that he could not get up off the floor without help. He feels he is slowly losing functional mobility. He feels that he is deconditioned at this time. He noticed he was not able  to keep up with family during a family vacation that brought his attention to the issue. Pt has LE edema and wears compression hose. He is currently still grocery shopping and does a lot of the cooking at home. 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  Pt states he has to very conscous of walking and dragging his foot. 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   PERTINENT HISTORY: Bilat TKA, osteoarthritis, stage IIIa CKD, recurrent UTI,coronary artery calcification, type II  DM, gout, interstitial lung disease, mild aortic stenosis and mild ascending dilatation, osteoporosis  PAIN:   Are you having pain? No Pain location: upper back, feet neuropathy   PRECAUTIONS: None  WEIGHT BEARING RESTRICTIONS No  FALLS:  Has patient fallen in last 6 months? Yes. 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department  PLOF: [place] : improve functional mobility   OBJECTIVE:   DIAGNOSTIC FINDINGS: N/A orthopedics  PATIENT SURVEYS:  FOTO 47 51 pts DC 6 pts MCII  COGNITION:  Overall cognitive status: Within functional limits for tasks assessed     SENSATION: WFL  EDEMA:  Bilat lower leg edema noted, pitting    POSTURE: rounded shoulders, forward head, increased thoracic kyphosis, posterior pelvic tilt, and flexed trunk   LOWER EXTREMITY ROM: limited knee flexion   LOWER EXTREMITY MMT: 4/5 throughout bilat hips, 4+/5 for knee ext bilat, 4/5 knee flexion bilat   FUNCTIONAL TESTS:  5 times sit to stand: requires UE to perform, 16.5s, unable to perform without UE with multiple attempts   6MWT: 852ft no rest breaks taken   <5s with SLS bilat  GAIT: Distance walked: 885ft Assistive device utilized: None Level of assistance: Complete Independence Comments: decreased foot clearance, shortened step length    TODAY'S TREATMENT:  Exercises - Sit to Stand with Armchair  - 2 x daily - 7 x weekly - 2 sets - 5 reps - Supine Bridge  - 2 x daily - 7 x weekly - 2 sets - 10 reps  Patient Education - walking program    PATIENT EDUCATION:  Education details: diagnosis, prognosis, anatomy, exercise progression, daily walking, transfer mechanics, safety/falls prevention, HEP, POC Person educated:  Patient Education method: Explanation, Demonstration, Tactile cues, Verbal cues, and Handouts Education comprehension: verbalized understanding, returned demonstration, verbal cues required, and tactile cues required     HOME EXERCISE PROGRAM:  Access Code: [email or web address] URL: [email or web address] Date: 03/05/2022 Prepared by: Daleen Bo    ASSESSMENT:   CLINICAL IMPRESSION: Patient is a 81 y.o. male presenting to therapy session for c/c of general deconditioning and LE weakness. Pt's PMH includes but is not limited to bilat TKA, OA, CKD, recurrent UTI, coronary artery calcification, DM2, gout, interstitial lung disease, mild aortic stenosis and mild ascending dilatation. Pt with functional LE weakness and decrease in endurance as demonstrated by 5XSTS and 6MWT. Pt is currently at an increased risk for falls as he is currently below cut-off scores. Pt does have good family support and is motivated to progress with future exercise program. Pain was not a limiting factor during initial evaluation. Pt would benefit from continued skilled therapy in order to reach goals and maximize functional strength and mobility for return to PLOF  and prevention of functional decline.     OBJECTIVE IMPAIRMENTS Abnormal gait, decreased activity tolerance, decreased balance, decreased endurance, decreased mobility, difficulty walking, decreased ROM, decreased strength, hypomobility, increased muscle spasms, impaired flexibility, improper body mechanics, and postural dysfunction.    ACTIVITY LIMITATIONS carrying, squatting, stairs, transfers, bed mobility, bathing, toileting, dressing, and locomotion level   PARTICIPATION LIMITATIONS: meal prep, cleaning, laundry, driving, shopping, community activity, and yard work   [place], Past/current experiences, Time since onset of injury/illness/exacerbation, and 3+ comorbidities:    are also affecting patient's functional outcome.    REHAB  POTENTIAL: Fair   CLINICAL DECISION MAKING: Unstable/changing   EVALUATION COMPLEXITY: Moderate     GOALS: SHORT TERM GOALS: Target date: 04/16/2022      Pt will become independent with HEP in order to demonstrate synthesis of PT education.   Goal status: INITIAL   2.  Pt will score at least 6 pt increase on FOTO to demonstrate functional improvement in MCII and pt perceived function.     Goal status: INITIAL 3. Pt will be able to demonstrate/report ability to walk >15 mins without pain in order to demonstrate functional improvement and tolerance to exercise and community mobility.  Baseline:  Goal status: INITIAL        LONG TERM GOALS: Target date: 05/28/2022       Pt  will become independent with final HEP in order to demonstrate synthesis of PT education.   Goal status: INITIAL   2.   Pt will be able to walk at least an increase of >/= 165 ft during 6MWT in order to demonstrate improvement in functional endurance and mobility for decrease in all cause mortality.    Goal status: INITIAL   3.   Pt will be able to perform 5XSTS in under 12s  in order to demonstrate functional improvement above the cut off score for adults. .  Goal status: INITIAL   4.   Pt will score >/= 51 on FOTO to demonstrate improvement in perceived LE/balance function.  Goal status: INITIAL       PLAN: PT FREQUENCY: 1-2x/week   PT DURATION:  12 weeks (plan for D/C in 8-10)   PLANNED INTERVENTIONS: Therapeutic exercises, Therapeutic activity, Neuromuscular re-education, Balance training, Gait training, Patient/Family education, Self Care, Joint mobilization, Joint manipulation, Stair training, Orthotic/Fit training, DME instructions, Aquatic Therapy, Dry Needling, Electrical stimulation, Wheelchair mobility training, Spinal manipulation, Spinal mobilization, Cryotherapy, Moist heat, Compression bandaging, scar mobilization, Splintting, Taping, Vasopneumatic device, Ultrasound, Ionotophoresis  4mg /ml Dexamethasone, Manual therapy, and Re-evaluation   PLAN FOR NEXT SESSION: general LE strength, endurance, transfer mechanics with STS    Daleen Bo, PT 03/05/2022, 3:09 PM

## 2022-03-09 ENCOUNTER — Encounter (HOSPITAL_BASED_OUTPATIENT_CLINIC_OR_DEPARTMENT_OTHER): Payer: Self-pay | Admitting: Physical Therapy

## 2022-03-09 ENCOUNTER — Ambulatory Visit (HOSPITAL_BASED_OUTPATIENT_CLINIC_OR_DEPARTMENT_OTHER): Payer: Medicare Other | Admitting: Physical Therapy

## 2022-03-09 DIAGNOSIS — M6281 Muscle weakness (generalized): Secondary | ICD-10-CM | POA: Diagnosis not present

## 2022-03-09 DIAGNOSIS — R262 Difficulty in walking, not elsewhere classified: Secondary | ICD-10-CM | POA: Diagnosis not present

## 2022-03-09 DIAGNOSIS — R5381 Other malaise: Secondary | ICD-10-CM | POA: Diagnosis not present

## 2022-03-09 NOTE — Therapy (Signed)
OUTPATIENT PHYSICAL THERAPY LOWER EXTREMITY TREATMENT   Patient Name: [name] Steele MRN: [id number] DOB:03-19-1941, 81 y.o., male Today's Date: 03/09/2022   PT End of Session - 03/09/22 1153     Visit Number 2    Number of Visits 18    Date for PT Re-Evaluation 06/03/22    Authorization Type Medical Care/[employer]    PT Start Time 1146    PT Stop Time 1226    PT Time Calculation (min) 40 min    Activity Tolerance Patient tolerated treatment well    Behavior During Therapy WFL for tasks assessed/performed              Past Medical History:  Diagnosis Date   Arthritis    Chronic kidney disease, stage 3a ([place])    Chronic urinary tract infection    Coronary artery calcification seen on CT scan    Diabetes mellitus without complication ([place])    Gout    Hypertension    ILD (interstitial lung disease) ([place])    by CT 01/2021   Mild aortic stenosis    Mild dilation of ascending aorta ([place])    Osteoporosis    Past Surgical History:  Procedure Laterality Date   CHOLECYSTECTOMY N/A 07/04/2018   Procedure: LAPAROSCOPIC CHOLECYSTECTOMY WITH INTRAOPERATIVE CHOLANGIOGRAM;  Surgeon: Coralie Keens, MD;  [facility];  Service: General;  Laterality: N/A;   ERCP N/A 07/05/2018   Procedure: ENDOSCOPIC RETROGRADE CHOLANGIOPANCREATOGRAPHY (ERCP);  Surgeon: Clarene Essex, MD;  Location: [facility];  Service: Endoscopy;  Laterality: N/A;   JOINT REPLACEMENT Bilateral    REMOVAL OF STONES  07/05/2018   Procedure: REMOVAL OF STONES;  Surgeon: Clarene Essex, MD;  [facility];  Service: Endoscopy;;   SPHINCTEROTOMY  07/05/2018   Procedure: Joan Mayans;  Surgeon: Clarene Essex, MD;  [facility];  Service: Endoscopy;;   Patient Active Problem List   Diagnosis Date Noted   Near syncope 02/06/2022   E. coli UTI 02/06/2022   Infrarenal abdominal aortic aneurysm (AAA) without rupture ([place]) 02/06/2022   Hypokalemia 02/06/2022   Normocytic anemia 02/06/2022    Coronary artery disease involving native coronary artery of native heart with angina pectoris ([place]) 04/14/2021   Heart murmur 04/14/2021   Nonspecific abnormal electrocardiogram (ECG) (EKG) 04/14/2021   Lower extremity edema 04/14/2021   Acute cholecystitis 07/04/2018   E coli bacteremia 04/11/2017   UTI (urinary tract infection) 04/10/2017   Sepsis ([place]) 04/10/2017   Type 2 diabetes mellitus without complication ([place]) 35/57/3220   Essential hypertension 04/10/2017    PCP: Deland Pretty, MD  REFERRING PROVIDER: Deland Pretty, MD  REFERRING DIAG: R53.81 (ICD-10-CM) - Other malaise  THERAPY DIAG:  Muscle weakness (generalized)  Difficulty walking  Rationale for Evaluation and Treatment Rehabilitation  ONSET DATE: 2023  SUBJECTIVE:   SUBJECTIVE STATEMENT:  Goes by [name]   Pt states he is doing well today. No issues with HEP and has been compliant with performance of STS at home. Has not done the bridges.    Eval: Pt presents with wife today to session. 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   Pt states his main concern is strength and mobility. Pt states he has been retired since 1973 and states his R knee began to slow down his activity. He finally got it replaced but learned the habit of getting up and down with the assist of UE. He feels that he has a lot of trouble getting out of chairs without UE and feels that he could  not get up off the floor without help. He feels he is slowly losing functional mobility. He feels that he is deconditioned at this time. He noticed he was not able to keep up with family during a family vacation that brought his attention to the issue. Pt has LE edema and wears compression hose. He is currently still grocery shopping and does a lot of the cooking at home. 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  Pt states he has to very conscous of walking and dragging his foot. DM2 and bilateral  neuropathy up to the shin/knee.   PERTINENT HISTORY: Bilat TKA, osteoarthritis, stage IIIa CKD, recurrent UTI,coronary artery calcification, type II DM, gout, interstitial lung disease, mild aortic stenosis and mild ascending dilatation, osteoporosis  PAIN:   Are you having pain? No Pain location: upper back, feet neuropathy   PRECAUTIONS: None  WEIGHT BEARING RESTRICTIONS No  FALLS:  Has patient fallen in last 6 months? Yes. 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department  PLOF: [place] : improve functional mobility   OBJECTIVE:   DIAGNOSTIC FINDINGS: N/A orthopedics  PATIENT SURVEYS:  FOTO 47 51 pts DC 6 pts MCII  TODAY'S TREATMENT:  Nu-step L3 6 min  Exercises -tandem balance 30s 3x each direction - Sit to Stand with Armchair  - 2 x daily - 7 x weekly - 2 sets - 5 reps - Supine Bridge  - 2 x daily - 7 x weekly - 2 sets - 10 reps - Heel Raises with Counter Support  - 2 x daily - 7 x weekly - 2 sets - 10 reps - Standing Marching  - 2 x daily - 7 x weekly - 1 sets - 20 reps  Sit<>stand transfers mechanics and safety  PATIENT EDUCATION:  Education details:  exercise progression, daily walking, transfer mechanics, safety/falls prevention, HEP, POC Person educated: Patient Education method: Explanation, Demonstration, Tactile cues, Verbal cues, and Handouts Education comprehension: verbalized understanding, returned demonstration, verbal cues required, and tactile cues required     HOME EXERCISE PROGRAM:  Access Code: [email or web address] URL: [email or web address] Date: 03/05/2022 Prepared by: Daleen Bo    ASSESSMENT:   CLINICAL IMPRESSION: Pt able to start general conditioning and strength  exercise today without pain or SOB. Pt did have good success with STS without UE today with VC and TC for weight transfer mechanics. Pt HEP updated at this time. Pt does have difficulty with balance due to neuropathy and LE weakness so plan to work on concurrent balance for prevention of falls. Pt would benefit from continued skilled therapy in order to reach goals and maximize functional strength and mobility for return to PLOF and prevention of functional decline.     OBJECTIVE IMPAIRMENTS Abnormal gait, decreased activity tolerance, decreased balance, decreased endurance, decreased mobility, difficulty walking, decreased ROM, decreased strength, hypomobility, increased muscle spasms, impaired flexibility, improper body mechanics, and postural dysfunction.    ACTIVITY LIMITATIONS carrying, squatting, stairs, transfers, bed mobility, bathing, toileting, dressing, and locomotion level   PARTICIPATION LIMITATIONS: meal prep, cleaning, laundry, driving, shopping, community activity, and yard work   [place], Past/current experiences, Time since onset of injury/illness/exacerbation, and 3+ comorbidities:    are also affecting patient's  functional outcome.    REHAB POTENTIAL: Fair   CLINICAL DECISION MAKING: Unstable/changing   EVALUATION COMPLEXITY: Moderate     GOALS: SHORT TERM GOALS: Target date: 04/20/2022      Pt will become independent with HEP in order to demonstrate synthesis of PT education.   Goal status: INITIAL   2.  Pt will score at least 6 pt increase on FOTO to demonstrate functional improvement in MCII and pt perceived function.     Goal status: INITIAL 3. Pt will be able to demonstrate/report ability to walk >15 mins without pain in order to demonstrate functional improvement and tolerance to exercise and community mobility.  Baseline:  Goal status: INITIAL        LONG TERM GOALS: Target date: 06/01/2022       Pt  will become independent with final  HEP in order to demonstrate synthesis of PT education.   Goal status: INITIAL   2.   Pt will be able to walk at least an increase of >/= 165 ft during 6MWT in order to demonstrate improvement in functional endurance and mobility for decrease in all cause mortality.    Goal status: INITIAL   3.   Pt will be able to perform 5XSTS in under 12s  in order to demonstrate functional improvement above the cut off score for adults. .  Goal status: INITIAL   4.   Pt will score >/= 51 on FOTO to demonstrate improvement in perceived LE/balance function.  Goal status: INITIAL       PLAN: PT FREQUENCY: 1-2x/week   PT DURATION:  12 weeks (plan for D/C in 8-10)   PLANNED INTERVENTIONS: Therapeutic exercises, Therapeutic activity, Neuromuscular re-education, Balance training, Gait training, Patient/Family education, Self Care, Joint mobilization, Joint manipulation, Stair training, Orthotic/Fit training, DME instructions, Aquatic Therapy, Dry Needling, Electrical stimulation, Wheelchair mobility training, Spinal manipulation, Spinal mobilization, Cryotherapy, Moist heat, Compression bandaging, scar mobilization, Splintting, Taping, Vasopneumatic device, Ultrasound, Ionotophoresis 4mg /ml Dexamethasone, Manual therapy, and Re-evaluation   PLAN FOR NEXT SESSION: general LE strength, endurance, balance    Daleen Bo, PT 03/09/2022, 1:13 PM

## 2022-03-11 ENCOUNTER — Encounter (HOSPITAL_BASED_OUTPATIENT_CLINIC_OR_DEPARTMENT_OTHER): Payer: Self-pay | Admitting: Physical Therapy

## 2022-03-11 ENCOUNTER — Ambulatory Visit (HOSPITAL_BASED_OUTPATIENT_CLINIC_OR_DEPARTMENT_OTHER): Payer: Medicare Other | Admitting: Physical Therapy

## 2022-03-11 DIAGNOSIS — M6281 Muscle weakness (generalized): Secondary | ICD-10-CM | POA: Diagnosis not present

## 2022-03-11 DIAGNOSIS — R5381 Other malaise: Secondary | ICD-10-CM | POA: Diagnosis not present

## 2022-03-11 DIAGNOSIS — R262 Difficulty in walking, not elsewhere classified: Secondary | ICD-10-CM | POA: Diagnosis not present

## 2022-03-11 NOTE — Therapy (Signed)
OUTPATIENT PHYSICAL THERAPY LOWER EXTREMITY TREATMENT   Patient Name: [name] Steele MRN: [id number] DOB:09/12/1940, 81 y.o., male Today's Date: 03/11/2022   PT End of Session - 03/11/22 1110     Visit Number 3    Number of Visits 18    Date for PT Re-Evaluation 06/03/22    Authorization Type Medical Care/[employer]    PT Start Time 1100    PT Stop Time 1138    PT Time Calculation (min) 38 min    Activity Tolerance Patient tolerated treatment well    Behavior During Therapy WFL for tasks assessed/performed              Past Medical History:  Diagnosis Date   Arthritis    Chronic kidney disease, stage 3a ([place])    Chronic urinary tract infection    Coronary artery calcification seen on CT scan    Diabetes mellitus without complication (HCC)    Gout    Hypertension    ILD (interstitial lung disease) ([place])    by CT 01/2021   Mild aortic stenosis    Mild dilation of ascending aorta ([place])    Osteoporosis    Past Surgical History:  Procedure Laterality Date   CHOLECYSTECTOMY N/A 07/04/2018   Procedure: LAPAROSCOPIC CHOLECYSTECTOMY WITH INTRAOPERATIVE CHOLANGIOGRAM;  Surgeon: Coralie Keens, MD;  Location: [facility];  Service: General;  Laterality: N/A;   ERCP N/A 07/05/2018   Procedure: ENDOSCOPIC RETROGRADE CHOLANGIOPANCREATOGRAPHY (ERCP);  Surgeon: Clarene Essex, MD;  Location: [facility];  Service: Endoscopy;  Laterality: N/A;   JOINT REPLACEMENT Bilateral    REMOVAL OF STONES  07/05/2018   Procedure: REMOVAL OF STONES;  Surgeon: Clarene Essex, MD;  Location: [facility];  Service: Endoscopy;;   SPHINCTEROTOMY  07/05/2018   Procedure: Joan Mayans;  Surgeon: Clarene Essex, MD;  [facility];  Service: Endoscopy;;   Patient Active Problem List   Diagnosis Date Noted   Near syncope 02/06/2022   E. coli UTI 02/06/2022   Infrarenal abdominal aortic aneurysm (AAA) without rupture ([place]) 02/06/2022   Hypokalemia 02/06/2022   Normocytic anemia 02/06/2022    Coronary artery disease involving native coronary artery of native heart with angina pectoris ([place]) 04/14/2021   Heart murmur 04/14/2021   Nonspecific abnormal electrocardiogram (ECG) (EKG) 04/14/2021   Lower extremity edema 04/14/2021   Acute cholecystitis 07/04/2018   E coli bacteremia 04/11/2017   UTI (urinary tract infection) 04/10/2017   Sepsis ([place]) 04/10/2017   Type 2 diabetes mellitus without complication ([place]) [id number]   Essential hypertension 04/10/2017    PCP: Deland Pretty, MD  REFERRING PROVIDER: Deland Pretty, MD  REFERRING DIAG: R53.81 (ICD-10-CM) - Other malaise  THERAPY DIAG:  Muscle weakness (generalized)  Difficulty walking  Rationale for Evaluation and Treatment Rehabilitation  ONSET DATE: 2023  SUBJECTIVE:   SUBJECTIVE STATEMENT:  Goes by [name]   Pt states his legs were a little sore after last session. He states he has started the walking which is fatiguing. He has not tried to walk up/down the hill in his neighborhood just yet.    Eval: Pt presents with wife today to session. 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   Pt states his main concern is strength and mobility. Pt states he has been retired since 1973 and states his R knee began to slow down his activity. He finally got it replaced but learned the habit of getting up and down with the assist of UE. He feels that he has a lot of trouble getting out  of chairs without UE and feels that he could not get up off the floor without help. He feels he is slowly losing functional mobility. He feels that he is deconditioned at this time. He noticed he was not able to keep up with family during a family vacation that brought his attention to the issue. Pt has LE edema and wears compression hose. He is currently still grocery shopping and does a lot of the cooking at home. 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  Pt states he has to very conscous of  walking and dragging his foot. 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   PERTINENT HISTORY: Bilat TKA, osteoarthritis, stage IIIa CKD, recurrent UTI,coronary artery calcification, type II DM, gout, interstitial lung disease, mild aortic stenosis and mild ascending dilatation, osteoporosis  PAIN:   Are you having pain? No Pain location: upper back, feet neuropathy   PRECAUTIONS: None  WEIGHT BEARING RESTRICTIONS No  FALLS:  Has patient fallen in last 6 months? Yes. 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[employer]  PLOF: [place] : improve functional mobility   OBJECTIVE:   DIAGNOSTIC FINDINGS: [place]:  FOTO 47 51 pts DC 6 pts MCII  TODAY'S TREATMENT:  Nu-step L4 6 min  Shuttle leg press 75lbs 3x8 YTB at knees sidestep at bar 3x laps -tandem balance 30s 3x on foam each direction - Sit to Stand with Armchair with YTB at knees  3 sets - 5 reps - Heel Raises with Counter Support  -2 sets - 10 reps - 6" box step up - 2x10  PATIENT EDUCATION:  Education details:  exercise progression, daily walking, transfer mechanics, safety/falls prevention, HEP, POC Person educated: Patient Education method: Explanation, Demonstration, Tactile cues, Verbal cues, and Handouts Education comprehension: verbalized understanding, returned demonstration, verbal cues required, and tactile cues required     HOME EXERCISE PROGRAM:  Access Code: [email or web address] URL: [email or web address] Date: 03/05/2022 Prepared by: Daleen Bo    ASSESSMENT:   CLINICAL IMPRESSION: Pt without complaints of pain during session and able to continue with LE strengthening exercise and balance today with  increased repetitions and intensity. No pain noted during session. RPE estimated at roughly 5 out of 10. Plan to continue with progressive endurance and exercise as tolerated. Consider future circuits to keep building endurance once fatigued. Pt would benefit from continued skilled therapy in order to reach goals and maximize functional strength and mobility for return to PLOF and prevention of functional decline.     OBJECTIVE IMPAIRMENTS Abnormal gait, decreased activity tolerance, decreased balance, decreased endurance, decreased mobility, difficulty walking, decreased ROM, decreased strength, hypomobility, increased muscle spasms, impaired flexibility, improper body mechanics, and postural dysfunction.    ACTIVITY LIMITATIONS carrying, squatting, stairs, transfers, bed mobility, bathing, toileting, dressing, and locomotion level   PARTICIPATION LIMITATIONS: meal prep, cleaning, laundry, driving, shopping, community activity, and yard work   [place], Past/current experiences, Time since onset of injury/illness/exacerbation, and 3+ comorbidities:    are also affecting patient's functional outcome.    REHAB POTENTIAL: Fair   CLINICAL DECISION MAKING: Unstable/changing   EVALUATION COMPLEXITY: Moderate     GOALS: SHORT TERM GOALS: Target date: 04/22/2022  Pt will become independent with HEP in order to demonstrate synthesis of PT education.   Goal status: INITIAL   2.  Pt will score at least 6 pt increase on FOTO to demonstrate functional improvement in MCII and pt perceived function.     Goal status: INITIAL 3. Pt will be able to demonstrate/report ability to walk >15 mins without pain in order to demonstrate functional improvement and tolerance to exercise and community mobility.  Baseline:  Goal status: INITIAL        LONG TERM GOALS: Target date: 06/03/2022       Pt  will become independent with final HEP in order to demonstrate synthesis of PT  education.   Goal status: INITIAL   2.   Pt will be able to walk at least an increase of >/= 165 ft during 6MWT in order to demonstrate improvement in functional endurance and mobility for decrease in all cause mortality.    Goal status: INITIAL   3.   Pt will be able to perform 5XSTS in under 12s  in order to demonstrate functional improvement above the cut off score for adults. .  Goal status: INITIAL   4.   Pt will score >/= 51 on FOTO to demonstrate improvement in perceived LE/balance function.  Goal status: INITIAL       PLAN: PT FREQUENCY: 1-2x/week   PT DURATION:  12 weeks (plan for D/C in 8-10)   PLANNED INTERVENTIONS: Therapeutic exercises, Therapeutic activity, Neuromuscular re-education, Balance training, Gait training, Patient/Family education, Self Care, Joint mobilization, Joint manipulation, Stair training, Orthotic/Fit training, DME instructions, Aquatic Therapy, Dry Needling, Electrical stimulation, Wheelchair mobility training, Spinal manipulation, Spinal mobilization, Cryotherapy, Moist heat, Compression bandaging, scar mobilization, Splintting, Taping, Vasopneumatic device, Ultrasound, Ionotophoresis 4mg /ml Dexamethasone, Manual therapy, and Re-evaluation   PLAN FOR NEXT SESSION: general LE strength, endurance, balance    Daleen Bo, PT 03/11/2022, 11:41 AM

## 2022-03-16 ENCOUNTER — Ambulatory Visit (HOSPITAL_BASED_OUTPATIENT_CLINIC_OR_DEPARTMENT_OTHER): Payer: Medicare Other | Admitting: Physical Therapy

## 2022-03-16 ENCOUNTER — Encounter (HOSPITAL_BASED_OUTPATIENT_CLINIC_OR_DEPARTMENT_OTHER): Payer: Self-pay | Admitting: Physical Therapy

## 2022-03-16 DIAGNOSIS — M6281 Muscle weakness (generalized): Secondary | ICD-10-CM

## 2022-03-16 DIAGNOSIS — R262 Difficulty in walking, not elsewhere classified: Secondary | ICD-10-CM

## 2022-03-16 DIAGNOSIS — R5381 Other malaise: Secondary | ICD-10-CM | POA: Diagnosis not present

## 2022-03-16 NOTE — Therapy (Signed)
OUTPATIENT PHYSICAL THERAPY LOWER EXTREMITY TREATMENT   Patient Name: [name] Steele MRN: [id number] DOB:23-Nov-1940, 81 y.o., male Today's Date: 03/16/2022   PT End of Session - 03/16/22 1243     Visit Number 4    Number of Visits 18    Date for PT Re-Evaluation 06/03/22    Authorization Type [employer] Care/[employer]    PT Start Time 1230    PT Stop Time [id number]    PT Time Calculation (min) 42 min    Activity Tolerance Patient tolerated treatment well    Behavior During Therapy WFL for tasks assessed/performed               Past Medical History:  Diagnosis Date   Arthritis    Chronic kidney disease, stage 3a ([place])    Chronic urinary tract infection    Coronary artery calcification seen on CT scan    Diabetes mellitus without complication ([place])    Gout    Hypertension    ILD (interstitial lung disease) ([place])    by CT 01/2021   Mild aortic stenosis    Mild dilation of ascending aorta ([place])    Osteoporosis    Past Surgical History:  Procedure Laterality Date   CHOLECYSTECTOMY N/A 07/04/2018   Procedure: LAPAROSCOPIC CHOLECYSTECTOMY WITH INTRAOPERATIVE CHOLANGIOGRAM;  Surgeon: Coralie Keens, MD;  [facility];  Service: General;  Laterality: N/A;   ERCP N/A 07/05/2018   Procedure: ENDOSCOPIC RETROGRADE CHOLANGIOPANCREATOGRAPHY (ERCP);  Surgeon: Clarene Essex, MD;  Location: [facility];  Service: Endoscopy;  Laterality: N/A;   JOINT REPLACEMENT Bilateral    REMOVAL OF STONES  07/05/2018   Procedure: REMOVAL OF STONES;  Surgeon: Clarene Essex, MD;  [facility];  Service: Endoscopy;;   SPHINCTEROTOMY  07/05/2018   Procedure: Joan Mayans;  Surgeon: Clarene Essex, MD;  [facility];  Service: Endoscopy;;   Patient Active Problem List   Diagnosis Date Noted   Near syncope 02/06/2022   E. coli UTI 02/06/2022   Infrarenal abdominal aortic aneurysm (AAA) without rupture ([place]) 02/06/2022   Hypokalemia 02/06/2022   Normocytic anemia 02/06/2022    Coronary artery disease involving native coronary artery of native heart with angina pectoris ([place]) 04/14/2021   Heart murmur 04/14/2021   Nonspecific abnormal electrocardiogram (ECG) (EKG) 04/14/2021   Lower extremity edema 04/14/2021   Acute cholecystitis 07/04/2018   E coli bacteremia 04/11/2017   UTI (urinary tract infection) 04/10/2017   Sepsis ([place]) 04/10/2017   Type 2 diabetes mellitus without complication ([place]) [id number]   Essential hypertension 04/10/2017    PCP: Deland Pretty, MD  REFERRING PROVIDER: Deland Pretty, MD  REFERRING DIAG: R53.81 (ICD-10-CM) - Other malaise  THERAPY DIAG:  Muscle weakness (generalized)  Difficulty walking  Rationale for Evaluation and Treatment Rehabilitation  ONSET DATE: 2023  SUBJECTIVE:   SUBJECTIVE STATEMENT:  Goes by [name]   The patient reports he was a little sore after the last visit. He had some trouble with the shuttle but overall it felt good.   Eval: Pt presents with wife today to session. 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   Pt states his main concern is strength and mobility. Pt states he has been retired since 1973 and states his R knee began to slow down his activity. He finally got it replaced but learned the habit of getting up and down with the assist of UE. He feels that he has a lot of trouble getting out of chairs without UE and feels that he could not get  up off the floor without help. He feels he is slowly losing functional mobility. He feels that he is deconditioned at this time. He noticed he was not able to keep up with family during a family vacation that brought his attention to the issue. Pt has LE edema and wears compression hose. He is currently still grocery shopping and does a lot of the cooking at home. 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  Pt states he has to very conscous of walking and dragging his foot. DM2 and bilateral neuropathy up  to the shin/knee.   PERTINENT HISTORY: Bilat TKA, osteoarthritis, stage IIIa CKD, recurrent UTI,coronary artery calcification, type II DM, gout, interstitial lung disease, mild aortic stenosis and mild ascending dilatation, osteoporosis  PAIN:   Are you having pain? No Pain location: upper back, feet neuropathy   PRECAUTIONS: None  WEIGHT BEARING RESTRICTIONS No  FALLS:  Has patient fallen in last 6 months? Yes. 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department  PLOF: [place] : improve functional mobility   OBJECTIVE:   DIAGNOSTIC FINDINGS: [place]:  FOTO 47 51 pts DC 6 pts MCII  TODAY'S TREATMENT: 10/30 Nu-step L4 6 min  life fitness leg press 75lbs 2x10 YTB at knees sidestep at bar 3x laps -narrow base 3x30 sec hold eyes closed. Min a first trial CGA subsequent trials  - Sit to Stand with Armchair with YTB at knees  3 sets - 5 reps - Heel Raises with Counter Support  -2 sets - 10 reps - 6" box step up - 2x10 - Standing slow march x15      Last visit   Nu-step L4 6 min  Shuttle leg press 75lbs 3x8 YTB at knees sidestep at bar 3x laps -tandem balance 30s 3x on foam each direction - Sit to Stand with Armchair with YTB at knees  3 sets - 5 reps - Heel Raises with Counter Support  -2 sets - 10 reps - 6" box step up - 2x10  PATIENT EDUCATION:  Education details:  exercise progression, daily walking, transfer mechanics, safety/falls prevention, HEP, POC Person educated: Patient Education method: Explanation, Demonstration, Tactile cues, Verbal cues, and Handouts Education comprehension: verbalized understanding, returned demonstration, verbal cues required, and tactile cues  required     HOME EXERCISE PROGRAM:  Access Code: [email or web address] URL: [email or web address] Date: 03/05/2022 Prepared by: Daleen Bo    ASSESSMENT:   CLINICAL IMPRESSION: The patient tolerated treatment well. We kept his exercises consistent today. He had no increase in pain. We will continue to progress as tolerated.  His balance improved with practice.   OBJECTIVE IMPAIRMENTS Abnormal gait, decreased activity tolerance, decreased balance, decreased endurance, decreased mobility, difficulty walking, decreased ROM, decreased strength, hypomobility, increased muscle spasms, impaired flexibility, improper body mechanics, and postural dysfunction.    ACTIVITY LIMITATIONS carrying, squatting, stairs, transfers, bed mobility, bathing, toileting, dressing, and locomotion level   PARTICIPATION LIMITATIONS: meal prep, cleaning, laundry, driving, shopping, community activity, and yard work   [place], Past/current experiences, Time since onset of injury/illness/exacerbation, and 3+ comorbidities:    are also affecting patient's functional outcome.    REHAB POTENTIAL: Fair   CLINICAL DECISION MAKING: Unstable/changing  EVALUATION COMPLEXITY: Moderate     GOALS: SHORT TERM GOALS: Target date: 04/27/2022      Pt will become independent with HEP in order to demonstrate synthesis of PT education.   Goal status: INITIAL   2.  Pt will score at least 6 pt increase on FOTO to demonstrate functional improvement in MCII and pt perceived function.     Goal status: INITIAL 3. Pt will be able to demonstrate/report ability to walk >15 mins without pain in order to demonstrate functional improvement and tolerance to exercise and community mobility.  Baseline:  Goal status: INITIAL        LONG TERM GOALS: Target date: 06/08/2022       Pt  will become independent with final HEP in order to demonstrate synthesis of PT education.   Goal status: INITIAL   2.   Pt will  be able to walk at least an increase of >/= 165 ft during 6MWT in order to demonstrate improvement in functional endurance and mobility for decrease in all cause mortality.    Goal status: INITIAL   3.   Pt will be able to perform 5XSTS in under 12s  in order to demonstrate functional improvement above the cut off score for adults. .  Goal status: INITIAL   4.   Pt will score >/= 51 on FOTO to demonstrate improvement in perceived LE/balance function.  Goal status: INITIAL       PLAN: PT FREQUENCY: 1-2x/week   PT DURATION:  12 weeks (plan for D/C in 8-10)   PLANNED INTERVENTIONS: Therapeutic exercises, Therapeutic activity, Neuromuscular re-education, Balance training, Gait training, Patient/Family education, Self Care, Joint mobilization, Joint manipulation, Stair training, Orthotic/Fit training, DME instructions, Aquatic Therapy, Dry Needling, Electrical stimulation, Wheelchair mobility training, Spinal manipulation, Spinal mobilization, Cryotherapy, Moist heat, Compression bandaging, scar mobilization, Splintting, Taping, Vasopneumatic device, Ultrasound, Ionotophoresis 4mg /ml Dexamethasone, Manual therapy, and Re-evaluation   PLAN FOR NEXT SESSION: general LE strength, endurance, balance    Carney Living, PT 03/16/2022, 2:15 PM

## 2022-03-18 ENCOUNTER — Ambulatory Visit (HOSPITAL_BASED_OUTPATIENT_CLINIC_OR_DEPARTMENT_OTHER): Payer: Medicare Other | Attending: Internal Medicine | Admitting: Physical Therapy

## 2022-03-18 ENCOUNTER — Encounter (HOSPITAL_BASED_OUTPATIENT_CLINIC_OR_DEPARTMENT_OTHER): Payer: Self-pay | Admitting: Physical Therapy

## 2022-03-18 DIAGNOSIS — M6281 Muscle weakness (generalized): Secondary | ICD-10-CM | POA: Diagnosis not present

## 2022-03-18 DIAGNOSIS — R262 Difficulty in walking, not elsewhere classified: Secondary | ICD-10-CM | POA: Insufficient documentation

## 2022-03-18 NOTE — Therapy (Signed)
OUTPATIENT PHYSICAL THERAPY LOWER EXTREMITY TREATMENT   Patient Name: Coy Bemiller MRN: [id number] DOB:1941-02-03, 81 y.o., male Today's Date: 03/18/2022   PT End of Session - 03/18/22 1238     Visit Number 5    Number of Visits 18    Date for PT Re-Evaluation 06/03/22    Authorization Type [employer]    PT Start Time [id number]    PT Stop Time 1230    PT Time Calculation (min) 45 min    Activity Tolerance Patient tolerated treatment well    Behavior During Therapy WFL for tasks assessed/performed                Past Medical History:  Diagnosis Date   Arthritis    Chronic kidney disease, stage 3a ([place])    Chronic urinary tract infection    Coronary artery calcification seen on CT scan    Diabetes mellitus without complication ([facility])    Gout    Hypertension    ILD (interstitial lung disease) ([place])    by CT 01/2021   Mild aortic stenosis    Mild dilation of ascending aorta ([place])    Osteoporosis    Past Surgical History:  Procedure Laterality Date   CHOLECYSTECTOMY N/A 07/04/2018   Procedure: LAPAROSCOPIC CHOLECYSTECTOMY WITH INTRAOPERATIVE CHOLANGIOGRAM;  Surgeon: Coralie Keens, MD;  Location: [facility];  Service: General;  Laterality: N/A;   ERCP N/A 07/05/2018   Procedure: ENDOSCOPIC RETROGRADE CHOLANGIOPANCREATOGRAPHY (ERCP);  Surgeon: Clarene Essex, MD;  Location: [facility];  Service: Endoscopy;  Laterality: N/A;   JOINT REPLACEMENT Bilateral    REMOVAL OF STONES  07/05/2018   Procedure: REMOVAL OF STONES;  Surgeon: Clarene Essex, MD;  Location: [facility];  Service: Endoscopy;;   SPHINCTEROTOMY  07/05/2018   Procedure: Joan Mayans;  Surgeon: Clarene Essex, MD;  Location: [facility];  Service: Endoscopy;;   Patient Active Problem List   Diagnosis Date Noted   Near syncope 02/06/2022   E. coli UTI 02/06/2022   Infrarenal abdominal aortic aneurysm (AAA) without rupture ([place]) 02/06/2022   Hypokalemia 02/06/2022   Normocytic anemia 02/06/2022    Coronary artery disease involving native coronary artery of native heart with angina pectoris ([place]) 04/14/2021   Heart murmur 04/14/2021   Nonspecific abnormal electrocardiogram (ECG) (EKG) 04/14/2021   Lower extremity edema 04/14/2021   Acute cholecystitis 07/04/2018   E coli bacteremia 04/11/2017   UTI (urinary tract infection) 04/10/2017   Sepsis ([place]) 04/10/2017   Type 2 diabetes mellitus without complication ([place]) [id number]   Essential hypertension 04/10/2017    PCP: Deland Pretty, MD  REFERRING PROVIDER: Deland Pretty, MD  REFERRING DIAG: R53.81 (ICD-10-CM) - Other malaise  THERAPY DIAG:  Muscle weakness (generalized)  Difficulty walking  Rationale for Evaluation and Treatment Rehabilitation  ONSET DATE: 2023  SUBJECTIVE:   SUBJECTIVE STATEMENT:  Goes by [name]   The patient reports he was okay after last visit. He states "I know I did something" afterwards.   Eval: Pt presents with wife today to session. 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   Pt states his main concern is strength and mobility. Pt states he has been retired since 1973 and states his R knee began to slow down his activity. He finally got it replaced but learned the habit of getting up and down with the assist of UE. He feels that he has a lot of trouble getting out of chairs without UE and feels that he could not get up off the floor without help.  He feels he is slowly losing functional mobility. He feels that he is deconditioned at this time. He noticed he was not able to keep up with family during a family vacation that brought his attention to the issue. Pt has LE edema and wears compression hose. He is currently still grocery shopping and does a lot of the cooking at home. 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  Pt states he has to very conscous of walking and dragging his foot. 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    PERTINENT HISTORY: Bilat TKA, osteoarthritis, stage IIIa CKD, recurrent UTI,coronary artery calcification, type II DM, gout, interstitial lung disease, mild aortic stenosis and mild ascending dilatation, osteoporosis  PAIN:   Are you having pain? No Pain location: upper back, feet neuropathy   PRECAUTIONS: None  WEIGHT BEARING RESTRICTIONS No  FALLS:  Has patient fallen in last 6 months? Yes. 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[employer]  PLOF: [place] : improve functional mobility   OBJECTIVE:   DIAGNOSTIC FINDINGS: N/A orthopedics  PATIENT SURVEYS:  FOTO 47 51 pts DC 6 pts MCII  TODAY'S TREATMENT:  11/1  Nu-step L4 6 min  Single leg shuttle leg press 50lbs 3x10 RTB at knees sidestep at bar 3x laps -narrow base 3x30 sec hold EC on foam. CGA   - Sit to Stand with Armchair with YTB at knees  3 sets - 5 reps - Heel Raises with Counter Support  -2 sets - 10 reps - 6" box latreal step up - 2x10 -6" box toe taps 2x20 - 5x hurdles at bar fwd and lateral 2x laps each   10/30 Nu-step L4 6 min  life fitness leg press 75lbs 2x10 YTB at knees sidestep at bar 3x laps -narrow base 3x30 sec hold eyes closed. Min a first trial CGA subsequent trials  - Sit to Stand with Armchair with YTB at knees  3 sets - 5 reps - Heel Raises with Counter Support  -2 sets - 10 reps - 6" box step up - 2x10 - Standing slow march x15     PATIENT EDUCATION:  Education details:  exercise progression, daily walking, transfer mechanics, safety/falls prevention, HEP, POC Person educated: Patient Education method: Explanation, Demonstration, Tactile cues, Verbal cues, and Handouts Education comprehension: verbalized understanding,  returned demonstration, verbal cues required, and tactile cues required     HOME EXERCISE PROGRAM:  Access Code: [email or web address] URL: [email or web address] Date: 03/05/2022 Prepared by: Daleen Bo    ASSESSMENT:   CLINICAL IMPRESSION: Pt able to continue with progression of balance and LE strength at today's session without significant difficulty. Pt does have LE strength deficits as well as knee flexion deficits limiting ability to navigate obstacles requiring higher stepping. Pt's R LE appears stiffer than L with stair stepping and hurdle management. Plan to continue with step up strength and hip extension strength for progression of stair and hill management. Pt would benefit from continued skilled therapy in order to reach goals and maximize functional strength and mobility for return to PLOF and prevention of functional decline.   OBJECTIVE IMPAIRMENTS Abnormal gait, decreased activity tolerance, decreased balance, decreased endurance, decreased mobility, difficulty walking, decreased ROM, decreased strength,  hypomobility, increased muscle spasms, impaired flexibility, improper body mechanics, and postural dysfunction.    ACTIVITY LIMITATIONS carrying, squatting, stairs, transfers, bed mobility, bathing, toileting, dressing, and locomotion level   PARTICIPATION LIMITATIONS: meal prep, cleaning, laundry, driving, shopping, community activity, and yard work   [place], Past/current experiences, Time since onset of injury/illness/exacerbation, and 3+ comorbidities:    are also affecting patient's functional outcome.    REHAB POTENTIAL: Fair   CLINICAL DECISION MAKING: Unstable/changing   EVALUATION COMPLEXITY: Moderate     GOALS: SHORT TERM GOALS: Target date: 04/27/2022      Pt will become independent with HEP in order to demonstrate synthesis of PT education.   Goal status: INITIAL   2.  Pt will score at least 6 pt increase on FOTO to demonstrate  functional improvement in MCII and pt perceived function.     Goal status: INITIAL 3. Pt will be able to demonstrate/report ability to walk >15 mins without pain in order to demonstrate functional improvement and tolerance to exercise and community mobility.  Baseline:  Goal status: INITIAL        LONG TERM GOALS: Target date: 06/08/2022       Pt  will become independent with final HEP in order to demonstrate synthesis of PT education.   Goal status: INITIAL   2.   Pt will be able to walk at least an increase of >/= 165 ft during 6MWT in order to demonstrate improvement in functional endurance and mobility for decrease in all cause mortality.    Goal status: INITIAL   3.   Pt will be able to perform 5XSTS in under 12s  in order to demonstrate functional improvement above the cut off score for adults. .  Goal status: INITIAL   4.   Pt will score >/= 51 on FOTO to demonstrate improvement in perceived LE/balance function.  Goal status: INITIAL       PLAN: PT FREQUENCY: 1-2x/week   PT DURATION:  12 weeks (plan for D/C in 8-10)   PLANNED INTERVENTIONS: Therapeutic exercises, Therapeutic activity, Neuromuscular re-education, Balance training, Gait training, Patient/Family education, Self Care, Joint mobilization, Joint manipulation, Stair training, Orthotic/Fit training, DME instructions, Aquatic Therapy, Dry Needling, Electrical stimulation, Wheelchair mobility training, Spinal manipulation, Spinal mobilization, Cryotherapy, Moist heat, Compression bandaging, scar mobilization, Splintting, Taping, Vasopneumatic device, Ultrasound, Ionotophoresis 4mg /ml Dexamethasone, Manual therapy, and Re-evaluation   PLAN FOR NEXT SESSION: general LE strength, endurance, balance    Daleen Bo, PT 03/18/2022, 12:41 PM

## 2022-03-25 ENCOUNTER — Ambulatory Visit (HOSPITAL_BASED_OUTPATIENT_CLINIC_OR_DEPARTMENT_OTHER): Payer: Medicare Other | Admitting: Physical Therapy

## 2022-03-25 ENCOUNTER — Encounter (HOSPITAL_BASED_OUTPATIENT_CLINIC_OR_DEPARTMENT_OTHER): Payer: Self-pay | Admitting: Physical Therapy

## 2022-03-25 DIAGNOSIS — R262 Difficulty in walking, not elsewhere classified: Secondary | ICD-10-CM | POA: Diagnosis not present

## 2022-03-25 DIAGNOSIS — M6281 Muscle weakness (generalized): Secondary | ICD-10-CM

## 2022-03-25 NOTE — Therapy (Signed)
OUTPATIENT PHYSICAL THERAPY LOWER EXTREMITY TREATMENT   Patient Name: [name] Steele MRN: [id number] DOB:Sep 14, 1940, 81 y.o., male Today's Date: 03/25/2022   PT End of Session - 03/25/22 1148     Visit Number 6    Number of Visits 18    Date for PT Re-Evaluation 06/03/22    Authorization Type [employer] Care/[employer]    PT Start Time 1145    PT Stop Time 1225    PT Time Calculation (min) 40 min    Activity Tolerance Patient tolerated treatment well    Behavior During Therapy WFL for tasks assessed/performed                Past Medical History:  Diagnosis Date   Arthritis    Chronic kidney disease, stage 3a ([facility])    Chronic urinary tract infection    Coronary artery calcification seen on CT scan    Diabetes mellitus without complication ([facility])    Gout    Hypertension    ILD (interstitial lung disease) ([facility])    by CT 01/2021   Mild aortic stenosis    Mild dilation of ascending aorta ([facility])    Osteoporosis    Past Surgical History:  Procedure Laterality Date   CHOLECYSTECTOMY N/A 07/04/2018   Procedure: LAPAROSCOPIC CHOLECYSTECTOMY WITH INTRAOPERATIVE CHOLANGIOGRAM;  Surgeon: [name] Miyamoto, MD;  Location: [facility];  Service: General;  Laterality: N/A;   ERCP N/A 07/05/2018   Procedure: ENDOSCOPIC RETROGRADE CHOLANGIOPANCREATOGRAPHY (ERCP);  Surgeon: [name] Rigger, MD;  Location: [facility];  Service: Endoscopy;  Laterality: N/A;   JOINT REPLACEMENT Bilateral    REMOVAL OF STONES  07/05/2018   Procedure: REMOVAL OF STONES;  Surgeon: [name] Rigger, MD;  Location: [facility];  Service: Endoscopy;;   SPHINCTEROTOMY  07/05/2018   Procedure: Dennison Mascot;  Surgeon: [name] Rigger, MD;  Location: [facility];  Service: Endoscopy;;   Patient Active Problem List   Diagnosis Date Noted   Near syncope 02/06/2022   E. coli UTI 02/06/2022   Infrarenal abdominal aortic aneurysm (AAA) without rupture ([facility]) 02/06/2022   Hypokalemia 02/06/2022   Normocytic anemia 02/06/2022    Coronary artery disease involving native coronary artery of native heart with angina pectoris ([facility]) 04/14/2021   Heart murmur 04/14/2021   Nonspecific abnormal electrocardiogram (ECG) (EKG) 04/14/2021   Lower extremity edema 04/14/2021   Acute cholecystitis 07/04/2018   E coli bacteremia 04/11/2017   UTI (urinary tract infection) 04/10/2017   Sepsis ([facility]) 04/10/2017   Type 2 diabetes mellitus without complication ([facility]) 04/10/2017   Essential hypertension 04/10/2017    PCP: [name] Brunette, MD  REFERRING PROVIDER: [name], MD  REFERRING DIAG: R53.81 (ICD-10-CM) - Other malaise  THERAPY DIAG:  Muscle weakness (generalized)  Difficulty walking  Rationale for Evaluation and Treatment Rehabilitation  ONSET DATE: 2023  SUBJECTIVE:   SUBJECTIVE STATEMENT:  Goes by [name]   The patient reports he was sore after last visit. Stairs are still challenging.   Eval: Pt presents with wife today to session. 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   Pt states his main concern is strength and mobility. Pt states he has been retired since 1973 and states his R knee began to slow down his activity. He finally got it replaced but learned the habit of getting up and down with the assist of UE. He feels that he has a lot of trouble getting out of chairs without UE and feels that he could not get up off the floor without help. He feels he is  slowly losing functional mobility. He feels that he is deconditioned at this time. He noticed he was not able to keep up with family during a family vacation that brought his attention to the issue. Pt has LE edema and wears compression hose. He is currently still grocery shopping and does a lot of the cooking at home. 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  Pt states he has to very conscous of walking and dragging his foot. 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   PERTINENT  HISTORY: Bilat TKA, osteoarthritis, stage IIIa CKD, recurrent UTI,coronary artery calcification, type II DM, gout, interstitial lung disease, mild aortic stenosis and mild ascending dilatation, osteoporosis  PAIN:   Are you having pain? No Pain location: upper back, feet neuropathy   PRECAUTIONS: None  WEIGHT BEARING RESTRICTIONS No  FALLS:  Has patient fallen in last 6 months? Yes. 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department  PLOF: Independent  PATIENT GOALS : improve functional mobility   OBJECTIVE:   DIAGNOSTIC FINDINGS: N/A orthopedics  PATIENT SURVEYS:  FOTO 47 51 pts DC 6 pts MCII  TODAY'S TREATMENT: 11/8  Nu-step L4 6 min  Single leg shuttle leg press 50lbs 3x10 -fwd step ups onto airex (two finger support needed) 2x10 -narrow base 3x30 sec hold EC on foam. CGA   - Sit to Stand (focus on power/RFD)  3 sets - 5 reps - standing march 2x20 with knees touching bar (focus on hip flexion and foot clearance) - 6" box latreal step up - 2x10 -8" box toe taps 2x20 -stair step knee flexion stretch 10s 5x    11/1  Nu-step L4 6 min  Single leg shuttle leg press 50lbs 3x10 RTB at knees sidestep at bar 3x laps -narrow base 3x30 sec hold EC on foam. CGA   - Sit to Stand with Armchair with YTB at knees  3 sets - 5 reps - Heel Raises with Counter Support  -2 sets - 10 reps - 6" box latreal step up - 2x10 -6" box toe taps 2x20 - 5x hurdles at bar fwd and lateral 2x laps each   10/30 Nu-step L4 6 min  life fitness leg press 75lbs 2x10 YTB at knees sidestep at bar 3x laps -narrow base 3x30 sec hold eyes closed. Min a first trial CGA subsequent trials  - Sit to Stand with Armchair with YTB at knees  3 sets - 5 reps - Heel  Raises with Counter Support  -2 sets - 10 reps - 6" box step up - 2x10 - Standing slow march x15     PATIENT EDUCATION:  Education details:  exercise progression, daily walking, transfer mechanics, safety/falls prevention, HEP, POC Person educated: Patient Education method: Explanation, Demonstration, Tactile cues, Verbal cues, and Handouts Education comprehension: verbalized understanding, returned demonstration, verbal cues required, and tactile cues required     HOME EXERCISE PROGRAM:  Access Code: [email or web address] URL: [email or web address] Date: 03/05/2022 Prepared by: Zebedee Iba    ASSESSMENT:   CLINICAL IMPRESSION: Pt's R LE appears to be the biggest limiting factor at this time with stair mobility bc of the lack of R hip and knee flexion. Pt does have quad and hip strength deficits affecting  functional mobility but pt requires circumduction in order to get leg up onto full sized step. Pt was able to peform marching with external cuing but appears to be limited at the hip as well. Plan to continue with functional mobility as tolerated but consider additional hip and knee stretching to improve flexibility. Pt has improved signficantly with STS strength and able to perform full STS without UE support at this time. Pt would benefit from continued skilled therapy in order to reach goals and maximize functional strength and mobility for return to PLOF and prevention of functional decline.   OBJECTIVE IMPAIRMENTS Abnormal gait, decreased activity tolerance, decreased balance, decreased endurance, decreased mobility, difficulty walking, decreased ROM, decreased strength, hypomobility, increased muscle spasms, impaired flexibility, improper body mechanics, and postural dysfunction.    ACTIVITY LIMITATIONS carrying, squatting, stairs, transfers, bed mobility, bathing, toileting, dressing, and locomotion level   PARTICIPATION LIMITATIONS: meal prep, cleaning, laundry, driving,  shopping, community activity, and yard work   PERSONAL FACTORS [place], Past/current experiences, Time since onset of injury/illness/exacerbation, and 3+ comorbidities:    are also affecting patient's functional outcome.    REHAB POTENTIAL: Fair   CLINICAL DECISION MAKING: Unstable/changing   EVALUATION COMPLEXITY: Moderate     GOALS: SHORT TERM GOALS: Target date: 04/27/2022      Pt will become independent with HEP in order to demonstrate synthesis of PT education.   Goal status: INITIAL   2.  Pt will score at least 6 pt increase on FOTO to demonstrate functional improvement in MCII and pt perceived function.     Goal status: INITIAL 3. Pt will be able to demonstrate/report ability to walk >15 mins without pain in order to demonstrate functional improvement and tolerance to exercise and community mobility.  Baseline:  Goal status: INITIAL        LONG TERM GOALS: Target date: 06/08/2022       Pt  will become independent with final HEP in order to demonstrate synthesis of PT education.   Goal status: INITIAL   2.   Pt will be able to walk at least an increase of >/= 165 ft during in order to demonstrate improvement in functional endurance and mobility for decrease in all cause mortality.    Goal status: INITIAL   3.   Pt will be able to perform 5XSTS in under 12s  in order to demonstrate functional improvement above the cut off score for adults. .  Goal status: INITIAL   4.   Pt will score >/= 51 on FOTO to demonstrate improvement in perceived LE/balance function.  Goal status: INITIAL       PLAN: PT FREQUENCY: 1-2x/week   PT DURATION:  12 weeks (plan for D/C in 8-10)   PLANNED INTERVENTIONS: Therapeutic exercises, Therapeutic activity, Neuromuscular re-education, Balance training, Gait training, Patient/Family education, Self Care, Joint mobilization, Joint manipulation, Stair training, Orthotic/Fit training, DME instructions, Aquatic Therapy, Dry Needling,  Electrical stimulation, Wheelchair mobility training, Spinal manipulation, Spinal mobilization, Cryotherapy, Moist heat, Compression bandaging, scar mobilization, Splintting, Taping, Vasopneumatic device, Ultrasound, Ionotophoresis 4mg /ml Dexamethasone, Manual therapy, and Re-evaluation   PLAN FOR NEXT SESSION: general LE strength, endurance, balance    , PT 03/25/2022, 12:37 PM

## 2022-03-25 NOTE — Progress Notes (Signed)
Synopsis: Referred for pulmonary fibrosis by [name], MD  Subjective:   PATIENT ID: [name] Steele GENDER: male DOB: Mar 06, 1941, MRN: [id number]  Chief Complaint  Patient presents with   Follow-up    PFT done today. His breathing is unchanged. No new co's.    81yM with history of CKD, DM, CAD, mild AS referred for ILD incidentally noted on cardiac CT  He says he feels fine overall. He has some DOE. He attributes it to lack of activity. Doesn't think he has greater DOE than peers without lung disease. He did have some cough which was productive for a couple weeks about a month ago. No dysphagia, rashes, joint pain, ulcers in mouth or nose, familyh history of autoimmune disease.  No family history of lung disease  He worked as Education officer, environmental. No exposures to dusts/solvents/particulates without a mask at home or at work. No pets, did have a cat. He smoked for 60 py quit 1988. No MJ, vaping.   Interval HPI: PFTs with nonspecific ventilatory defect, mildly reduced diffusing capacity  He says he's doing ok overall. He has occasionally productive cough, more notable in the morning, not bothersome. No DOE that limits his activities.     Otherwise pertinent review of systems is negative.  Past Medical History:  Diagnosis Date   Arthritis    Chronic kidney disease, stage 3a (HCC)    Chronic urinary tract infection    Coronary artery calcification seen on CT scan    Diabetes mellitus without complication (HCC)    Gout    Hypertension    ILD (interstitial lung disease) (HCC)    by CT 01/2021   Mild aortic stenosis    Mild dilation of ascending aorta (HCC)    Osteoporosis      Family History  Problem Relation Age of Onset   Heart disease Sister      Past Surgical History:  Procedure Laterality Date   CHOLECYSTECTOMY N/A 07/04/2018   Procedure: LAPAROSCOPIC CHOLECYSTECTOMY WITH INTRAOPERATIVE CHOLANGIOGRAM;  Surgeon: Abigail Miyamoto, MD;  Location: [facility];  Service: General;   Laterality: N/A;   ERCP N/A 07/05/2018   Procedure: ENDOSCOPIC RETROGRADE CHOLANGIOPANCREATOGRAPHY (ERCP);  Surgeon: Vida Rigger, MD;  Location: [facility];  Service: Endoscopy;  Laterality: N/A;   JOINT REPLACEMENT Bilateral    REMOVAL OF STONES  07/05/2018   Procedure: REMOVAL OF STONES;  Surgeon: Vida Rigger, MD;  Location: [facility];  Service: Endoscopy;;   SPHINCTEROTOMY  07/05/2018   Procedure: Dennison Mascot;  Surgeon: Vida Rigger, MD;  Location: [facility];  Service: Endoscopy;;    Social History   Socioeconomic History   Marital status: Married    Spouse name: Not on file   Number of children: Not on file   Years of education: Not on file   Highest education level: Not on file  Occupational History   Not on file  Tobacco Use   Smoking status: Former    Packs/day: 1.00    Years: 31.00    Total pack years: 31.00    Types: Cigarettes    Quit date: 69    Years since quitting: 35.8   Smokeless tobacco: Never  Vaping Use   Vaping Use: Never used  Substance and Sexual Activity   Alcohol use: Yes    Alcohol/week: 0.0 standard drinks of alcohol    Comment: social   Drug use: No   Sexual activity: Not Currently  Other Topics Concern   Not on file  Social History Narrative   Not  on file   Social Determinants of Health   Financial Resource Strain: Not on file  Food Insecurity: No Food Insecurity (02/06/2022)   Hunger Vital Sign    Worried About Running Out of Food in the Last Year: Never true    Ran Out of Food in the Last Year: Never true  Transportation Needs: No Transportation Needs (02/06/2022)   PRAPARE - Administrator, Civil Service (Medical): No    Lack of Transportation (Non-Medical): No  Physical Activity: Not on file  Stress: Not on file  Social Connections: Not on file  Intimate Partner Violence: Not At Risk (02/06/2022)   Humiliation, Afraid, Rape, and Kick questionnaire    Fear of Current or Ex-Partner: No    Emotionally Abused: No     Physically Abused: No    Sexually Abused: No     Allergies  Allergen Reactions   Erythromycin Other (See Comments)    Severe Abdominal Pain   Penicillin G Other (See Comments)   Penicillins Hives    Has patient had a PCN reaction causing immediate rash, facial/tongue/throat swelling, SOB or lightheadedness with hypotension:  Yes  Has patient had a PCN reaction causing severe rash involving mucus membranes or skin necrosis:  No Has patient had a PCN reaction that required hospitalization: No Has patient had a PCN reaction occurring within the last 10 years: No If all of the above answers are "NO", then may proceed with Cephalosporin use. Tolerated CTX 11/23 admit      Outpatient Medications Prior to Visit  Medication Sig Dispense Refill   aspirin EC 81 MG tablet Take 1 tablet (81 mg total) by mouth daily. Swallow whole. 90 tablet 3   carvedilol (COREG) 6.25 MG tablet Take 1 tablet (6.25 mg total) by mouth 2 (two) times daily. 180 tablet 1   colchicine 0.6 MG tablet Take 0.6 mg by mouth daily as needed (gout).      CRANBERRY PO Take 1 capsule by mouth daily.     furosemide (LASIX) 20 MG tablet Take 1 tablet (20 mg total) by mouth daily as needed. 30 tablet 3   ipratropium (ATROVENT) 0.06 % nasal spray SMARTSIG:2 Spray(s) Both Nares 3 Times Daily PRN     metFORMIN (GLUCOPHAGE-XR) 750 MG 24 hr tablet Take 750 mg by mouth daily after breakfast.     rosuvastatin (CRESTOR) 10 MG tablet Take 1 tablet (10 mg total) by mouth daily. 90 tablet 3   telmisartan (MICARDIS) 80 MG tablet Take 80 mg by mouth daily.     No facility-administered medications prior to visit.       Objective:   Physical Exam:  General appearance: 81 y.o., male, NAD, conversant  Eyes: anicteric sclerae; PERRL, tracking appropriately HENT: NCAT; MMM Neck: Trachea midline; no lymphadenopathy, no JVD Lungs: CTAB, no crackles, no wheeze, with normal respiratory effort CV: RRR, no murmur  Abdomen: Soft,  non-tender; non-distended, BS present  Extremities: No peripheral edema, warm Skin: Normal turgor and texture; no rash Psych: Appropriate affect Neuro: Alert and oriented to Steele and place, no focal deficit     Vitals:   03/27/22 1412  BP: (!) 142/90  Pulse: (!) 58  Temp: 97.9 F (36.6 C)  TempSrc: Oral  SpO2: 95%  Weight: 160 lb (72.6 kg)  Height: 5' 7.5" (1.715 m)    95% on RA BMI Readings from Last 3 Encounters:  03/27/22 24.69 kg/m  02/13/22 21.95 kg/m  02/06/22 22.53 kg/m   Wt Readings from Last  3 Encounters:  03/27/22 160 lb (72.6 kg)  02/13/22 153 lb (69.4 kg)  02/06/22 157 lb (71.2 kg)     CBC    Component Value Date/Time   WBC 4.6 02/07/2022 0534   RBC 3.38 (L) 02/07/2022 0534   HGB 10.6 (L) 02/07/2022 0534   HGB 12.9 (L) 10/14/2021 1425   HCT 33.8 (L) 02/07/2022 0534   HCT 38.9 10/14/2021 1425   PLT 168 02/07/2022 0534   PLT 231 10/14/2021 1425   MCV 100.0 02/07/2022 0534   MCV 95 10/14/2021 1425   MCH 31.4 02/07/2022 0534   MCHC 31.4 02/07/2022 0534   RDW 13.9 02/07/2022 0534   RDW 12.8 10/14/2021 1425   LYMPHSABS 0.6 (L) 02/03/2022 1831   MONOABS 0.8 02/03/2022 1831   EOSABS 0.0 02/03/2022 1831   BASOSABS 0.0 02/03/2022 1831    Eos 0-100  Chest Imaging: Cardiac CT 02/11/21 reviewed by me with minor extent but progressive lower lobe predominant subpleural reticulation (relative to 2019)  Pulmonary Functions Testing Results:    Latest Ref Rng & Units 03/27/2022   12:40 PM  PFT Results  FVC-Pre L 2.62  P  FVC-Predicted Pre % 73  P  FVC-Post L 2.93  P  FVC-Predicted Post % 81  P  Pre FEV1/FVC % % 67  P  Post FEV1/FCV % % 69  P  FEV1-Pre L 1.76  P  FEV1-Predicted Pre % 70  P  FEV1-Post L 2.00  P  DLCO uncorrected ml/min/mmHg 15.56  P  DLCO UNC% % 69  P  DLCO corrected ml/min/mmHg 17.98  P  DLCO COR %Predicted % 80  P  DLVA Predicted % 115  P  TLC L 5.51  P  TLC % Predicted % 83  P  RV % Predicted % 115  P    P Preliminary  result   Nonspecific ventilatory defect, mildly reduced diffusing capacity  Echocardiogram:   TTE 02/07/22:  1. Left ventricular ejection fraction, by estimation, is 60 to 65%. The  left ventricle has normal function. The left ventricle has no regional  wall motion abnormalities. There is mild left ventricular hypertrophy.  Left ventricular diastolic parameters  were normal.   2. Right ventricular systolic function is normal. The right ventricular  size is normal. There is normal pulmonary artery systolic pressure.   3. The mitral valve is abnormal. No evidence of mitral valve  regurgitation. No evidence of mitral stenosis.   4. Gradient stable since TTE 10/14/21. The aortic valve is tricuspid.  There is moderate calcification of the aortic valve. There is moderate  thickening of the aortic valve. Aortic valve regurgitation is not  visualized. Mild aortic valve stenosis.   5. The inferior vena cava is normal in size with greater than 50%  respiratory variability, suggesting right atrial pressure of 3 mmHg.       Assessment & Plan:   # ILD  Probably progressive though limited by comparison of CTA to dedicated CT Chest. His DOE is only mild and doesn't limit his activities, cough is not bothersome.  Plan: - HRCT Chest now - PFT in a year and clinic visit - we'll decide based on symptoms, HRCT, PFT whether to order CTD serologies, consider bronch, antifibrotics although with his limited symptom burden I think a little surveillance is more likely ultimately     [name] Person, MD [facility] 03/27/2022 5:22 PM

## 2022-03-27 ENCOUNTER — Ambulatory Visit (HOSPITAL_BASED_OUTPATIENT_CLINIC_OR_DEPARTMENT_OTHER): Payer: Medicare Other | Admitting: Physical Therapy

## 2022-03-27 ENCOUNTER — Ambulatory Visit (INDEPENDENT_AMBULATORY_CARE_PROVIDER_SITE_OTHER): Payer: Medicare Other | Admitting: Student

## 2022-03-27 ENCOUNTER — Encounter: Payer: Self-pay | Admitting: Student

## 2022-03-27 VITALS — BP 142/90 | HR 58 | Temp 97.9°F | Ht 67.5 in | Wt 160.0 lb

## 2022-03-27 DIAGNOSIS — J849 Interstitial pulmonary disease, unspecified: Secondary | ICD-10-CM

## 2022-03-27 LAB — PULMONARY FUNCTION TEST
DL/VA % pred: 115 %
DL/VA: 4.5 ml/min/mmHg/L
DLCO cor % pred: 80 %
DLCO cor: 17.98 ml/min/mmHg
DLCO unc % pred: 69 %
DLCO unc: 15.56 ml/min/mmHg
FEF 25-75 Post: 1.63 L/sec
FEF 25-75 Pre: 1.02 L/sec
FEF2575-%Change-Post: 58 %
FEF2575-%Pred-Post: 95 %
FEF2575-%Pred-Pre: 60 %
FEV1-%Change-Post: 13 %
FEV1-%Pred-Post: 79 %
FEV1-%Pred-Pre: 70 %
FEV1-Post: 2 L
FEV1-Pre: 1.76 L
FEV1FVC-%Change-Post: 1 %
FEV1FVC-%Pred-Pre: 94 %
FEV6-%Change-Post: 11 %
FEV6-%Pred-Post: 88 %
FEV6-%Pred-Pre: 78 %
FEV6-Post: 2.93 L
FEV6-Pre: 2.62 L
FEV6FVC-%Pred-Post: 107 %
FEV6FVC-%Pred-Pre: 107 %
FVC-%Change-Post: 11 %
FVC-%Pred-Post: 81 %
FVC-%Pred-Pre: 73 %
FVC-Post: 2.93 L
FVC-Pre: 2.62 L
Post FEV1/FVC ratio: 69 %
Post FEV6/FVC ratio: 100 %
Pre FEV1/FVC ratio: 67 %
Pre FEV6/FVC Ratio: 100 %
RV % pred: 115 %
RV: 2.92 L
TLC % pred: 83 %
TLC: 5.51 L

## 2022-03-27 NOTE — Patient Instructions (Signed)
-   you will be called to schedule ct chest soon - next clinic visit with breathing tests in 1 year

## 2022-03-27 NOTE — Progress Notes (Signed)
PFT done today. 

## 2022-03-30 ENCOUNTER — Encounter (HOSPITAL_BASED_OUTPATIENT_CLINIC_OR_DEPARTMENT_OTHER): Payer: Self-pay | Admitting: Physical Therapy

## 2022-03-30 ENCOUNTER — Ambulatory Visit (HOSPITAL_BASED_OUTPATIENT_CLINIC_OR_DEPARTMENT_OTHER): Payer: Medicare Other | Admitting: Physical Therapy

## 2022-03-30 DIAGNOSIS — R262 Difficulty in walking, not elsewhere classified: Secondary | ICD-10-CM

## 2022-03-30 DIAGNOSIS — M6281 Muscle weakness (generalized): Secondary | ICD-10-CM | POA: Diagnosis not present

## 2022-03-30 NOTE — Therapy (Signed)
OUTPATIENT PHYSICAL THERAPY LOWER EXTREMITY TREATMENT   Patient Name: [name] Steele MRN: [id number] DOB:1940-09-12, 81 y.o., male Today's Date: 03/30/2022   PT End of Session - 03/30/22 1521     Visit Number 7    Number of Visits 18    Date for PT Re-Evaluation 06/03/22    Authorization Type Medical Care/[employer]    PT Start Time 1515    PT Stop Time 1555    PT Time Calculation (min) 40 min    Activity Tolerance Patient tolerated treatment well    Behavior During Therapy WFL for tasks assessed/performed                 Past Medical History:  Diagnosis Date   Arthritis    Chronic kidney disease, stage 3a (HCC)    Chronic urinary tract infection    Coronary artery calcification seen on CT scan    Diabetes mellitus without complication (HCC)    Gout    Hypertension    ILD (interstitial lung disease) (HCC)    by CT 01/2021   Mild aortic stenosis    Mild dilation of ascending aorta (HCC)    Osteoporosis    Past Surgical History:  Procedure Laterality Date   CHOLECYSTECTOMY N/A 07/04/2018   Procedure: LAPAROSCOPIC CHOLECYSTECTOMY WITH INTRAOPERATIVE CHOLANGIOGRAM;  Surgeon: [name] Miyamoto, MD;  Location: [facility];  Service: General;  Laterality: N/A;   ERCP N/A 07/05/2018   Procedure: ENDOSCOPIC RETROGRADE CHOLANGIOPANCREATOGRAPHY (ERCP);  Surgeon: [name] Rigger, MD;  Location: [facility];  Service: Endoscopy;  Laterality: N/A;   JOINT REPLACEMENT Bilateral    REMOVAL OF STONES  07/05/2018   Procedure: REMOVAL OF STONES;  Surgeon: [name] Rigger, MD;  Location: [facility];  Service: Endoscopy;;   SPHINCTEROTOMY  07/05/2018   Procedure: Dennison Mascot;  Surgeon: [name] Rigger, MD;  Location: [facility];  Service: Endoscopy;;   Patient Active Problem List   Diagnosis Date Noted   Near syncope 02/06/2022   E. coli UTI 02/06/2022   Infrarenal abdominal aortic aneurysm (AAA) without rupture (HCC) 02/06/2022   Hypokalemia 02/06/2022   Normocytic anemia 02/06/2022    Coronary artery disease involving native coronary artery of native heart with angina pectoris (HCC) 04/14/2021   Heart murmur 04/14/2021   Nonspecific abnormal electrocardiogram (ECG) (EKG) 04/14/2021   Lower extremity edema 04/14/2021   Acute cholecystitis 07/04/2018   E coli bacteremia 04/11/2017   UTI (urinary tract infection) 04/10/2017   Sepsis (HCC) 04/10/2017   Type 2 diabetes mellitus without complication (HCC) 04/10/2017   Essential hypertension 04/10/2017    PCP: [name] Brunette, MD  REFERRING PROVIDER: [name], MD  REFERRING DIAG: R53.81 (ICD-10-CM) - Other malaise  THERAPY DIAG:  Muscle weakness (generalized)  Difficulty walking  Rationale for Evaluation and Treatment Rehabilitation  ONSET DATE: 2023  SUBJECTIVE:   SUBJECTIVE STATEMENT:  Goes by [name]   Pt states the R elbow is sore today but no other issues. Pt states pulmonary test was "normal" on Friday.   Eval: Pt presents with wife today to session. 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   Pt states his main concern is strength and mobility. Pt states he has been retired since 1973 and states his R knee began to slow down his activity. He finally got it replaced but learned the habit of getting up and down with the assist of UE. He feels that he has a lot of trouble getting out of chairs without UE and feels that he could not get up off  the floor without help. He feels he is slowly losing functional mobility. He feels that he is deconditioned at this time. He noticed he was not able to keep up with family during a family vacation that brought his attention to the issue. Pt has LE edema and wears compression hose. He is currently still grocery shopping and does a lot of the cooking at home. 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  Pt states he has to very conscous of walking and dragging his foot. 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    PERTINENT HISTORY: Bilat TKA, osteoarthritis, stage IIIa CKD, recurrent UTI,coronary artery calcification, type II DM, gout, interstitial lung disease, mild aortic stenosis and mild ascending dilatation, osteoporosis  PAIN:   Are you having pain? No Pain location: upper back, feet neuropathy   PRECAUTIONS: None  WEIGHT BEARING RESTRICTIONS No  FALLS:  Has patient fallen in last 6 months? Yes. 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department  PLOF: Independent  PATIENT GOALS : improve functional mobility   OBJECTIVE:   DIAGNOSTIC FINDINGS: N/A orthopedics  PATIENT SURVEYS:  FOTO 47 51 pts DC 6 pts MCII  FOTO 11/13   TODAY'S TREATMENT:  11/13  Nu-step L4 6 min  Single leg shuttle leg press 25lbs 3x10 (focus on getting more R knee flexion) -fwd step ups onto airex (two finger support needed) 2x10 -seated LAQ 5lbs 3x10 each LE - Sit to Stand (focus on power/RFD)  3 sets - 5 reps - standing march 2x20 with knees touching bar (focus on hip flexion and foot clearance) - 6" box latreal step up - 2x10 -8" box toe taps 2x20 -stair step knee flexion stretch 30s 3x   11/8  Nu-step L4 6 min  Single leg shuttle leg press 50lbs 3x10 -fwd step ups onto airex (two finger support needed) 2x10 -narrow base 3x30 sec hold EC on foam. CGA   - Sit to Stand (focus on power/RFD)  3 sets - 5 reps - standing march 2x20 with knees touching bar (focus on hip flexion and foot clearance) - 6" box latreal step up - 2x10 -8" box toe taps 2x20 -stair step knee flexion stretch 10s 5x    11/1  Nu-step L4 6 min  Single leg shuttle leg press 50lbs 3x10 RTB at knees sidestep at bar 3x laps -narrow base 3x30 sec hold EC on foam. CGA   - Sit to  Stand with Armchair with YTB at knees  3 sets - 5 reps - Heel Raises with Counter Support  -2 sets - 10 reps - 6" box latreal step up - 2x10 -6" box toe taps 2x20 - 5x hurdles at bar fwd and lateral 2x laps each   10/30 Nu-step L4 6 min  life fitness leg press 75lbs 2x10 YTB at knees sidestep at bar 3x laps -narrow base 3x30 sec hold eyes closed. Min a first trial CGA subsequent trials  - Sit to Stand with Armchair with YTB at knees  3 sets - 5 reps - Heel Raises with Counter Support  -2 sets - 10 reps - 6" box step up - 2x10 - Standing slow march x15     PATIENT EDUCATION:  Education  details:  exercise progression, daily walking, transfer mechanics, safety/falls prevention, HEP, POC Person educated: Patient Education method: Explanation, Demonstration, Tactile cues, Verbal cues, and Handouts Education comprehension: verbalized understanding, returned demonstration, verbal cues required, and tactile cues required     HOME EXERCISE PROGRAM:  Access Code: [email or web address] URL: [email or web address] Date: 03/05/2022 Prepared by: Zebedee Iba    ASSESSMENT:   CLINICAL IMPRESSION: Pt able to continue with previous exercise with addition of R knee specific strengthening and stretching. R knee continues to be a limiting factor in stair/obstacle navigation due to lack of ROM and knee ext strength. Given duration of deficits, pts improvement may be limited but appears likely he will continue to improve.  Pt with more noticeable R quad strength deficits as compared to L. No pain noted during session and no reproduction of R elbow pain. Plan to continue with functional mobility and strength as tolreated. Pt would benefit from continued skilled therapy in order to reach goals and maximize functional strength and mobility for return to PLOF and prevention of functional decline.   OBJECTIVE IMPAIRMENTS Abnormal gait, decreased activity tolerance, decreased balance, decreased endurance,  decreased mobility, difficulty walking, decreased ROM, decreased strength, hypomobility, increased muscle spasms, impaired flexibility, improper body mechanics, and postural dysfunction.    ACTIVITY LIMITATIONS carrying, squatting, stairs, transfers, bed mobility, bathing, toileting, dressing, and locomotion level   PARTICIPATION LIMITATIONS: meal prep, cleaning, laundry, driving, shopping, community activity, and yard work   PERSONAL FACTORS Fitness, Past/current experiences, Time since onset of injury/illness/exacerbation, and 3+ comorbidities:    are also affecting patient's functional outcome.    REHAB POTENTIAL: Fair   CLINICAL DECISION MAKING: Unstable/changing   EVALUATION COMPLEXITY: Moderate     GOALS: SHORT TERM GOALS: Target date: 04/27/2022      Pt will become independent with HEP in order to demonstrate synthesis of PT education.   Goal status: INITIAL   2.  Pt will score at least 6 pt increase on FOTO to demonstrate functional improvement in MCII and pt perceived function.     Goal status: INITIAL 3. Pt will be able to demonstrate/report ability to walk >15 mins without pain in order to demonstrate functional improvement and tolerance to exercise and community mobility.  Baseline:  Goal status: INITIAL        LONG TERM GOALS: Target date: 06/08/2022       Pt  will become independent with final HEP in order to demonstrate synthesis of PT education.   Goal status: INITIAL   2.   Pt will be able to walk at least an increase of >/= 165 ft during in order to demonstrate improvement in functional endurance and mobility for decrease in all cause mortality.    Goal status: INITIAL   3.   Pt will be able to perform 5XSTS in under 12s  in order to demonstrate functional improvement above the cut off score for adults. .  Goal status: INITIAL   4.   Pt will score >/= 51 on FOTO to demonstrate improvement in perceived LE/balance function.  Goal status: INITIAL        PLAN: PT FREQUENCY: 1-2x/week   PT DURATION:  12 weeks (plan for D/C in 8-10)   PLANNED INTERVENTIONS: Therapeutic exercises, Therapeutic activity, Neuromuscular re-education, Balance training, Gait training, Patient/Family education, Self Care, Joint mobilization, Joint manipulation, Stair training, Orthotic/Fit training, DME instructions, Aquatic Therapy, Dry Needling, Electrical stimulation, Wheelchair mobility training, Spinal manipulation, Spinal mobilization, Cryotherapy, Moist heat, Compression bandaging, scar mobilization, Splintting, Taping, Vasopneumatic  device, Ultrasound, Ionotophoresis 4mg /ml Dexamethasone, Manual therapy, and Re-evaluation   PLAN FOR NEXT SESSION: general LE strength, Focus on R knee, endurance, balance    , PT 03/30/2022, 4:05 PM

## 2022-03-31 ENCOUNTER — Ambulatory Visit (INDEPENDENT_AMBULATORY_CARE_PROVIDER_SITE_OTHER): Payer: Medicare Other | Admitting: Podiatry

## 2022-03-31 ENCOUNTER — Encounter: Payer: Self-pay | Admitting: Podiatry

## 2022-03-31 DIAGNOSIS — M79674 Pain in right toe(s): Secondary | ICD-10-CM

## 2022-03-31 DIAGNOSIS — B351 Tinea unguium: Secondary | ICD-10-CM | POA: Diagnosis not present

## 2022-03-31 DIAGNOSIS — L84 Corns and callosities: Secondary | ICD-10-CM

## 2022-03-31 DIAGNOSIS — E119 Type 2 diabetes mellitus without complications: Secondary | ICD-10-CM

## 2022-03-31 DIAGNOSIS — M79675 Pain in left toe(s): Secondary | ICD-10-CM

## 2022-04-01 ENCOUNTER — Ambulatory Visit (HOSPITAL_BASED_OUTPATIENT_CLINIC_OR_DEPARTMENT_OTHER): Payer: Medicare Other | Admitting: Physical Therapy

## 2022-04-01 ENCOUNTER — Encounter (HOSPITAL_BASED_OUTPATIENT_CLINIC_OR_DEPARTMENT_OTHER): Payer: Self-pay | Admitting: Physical Therapy

## 2022-04-01 DIAGNOSIS — R262 Difficulty in walking, not elsewhere classified: Secondary | ICD-10-CM | POA: Diagnosis not present

## 2022-04-01 DIAGNOSIS — M6281 Muscle weakness (generalized): Secondary | ICD-10-CM

## 2022-04-01 NOTE — Therapy (Signed)
OUTPATIENT PHYSICAL THERAPY LOWER EXTREMITY TREATMENT   Patient Name: [name] Steele MRN: [id number] DOB:1941-04-11, 81 y.o., male Today's Date: 04/01/2022         Past Medical History:  Diagnosis Date   Arthritis    Chronic kidney disease, stage 3a ([place])    Chronic urinary tract infection    Coronary artery calcification seen on CT scan    Diabetes mellitus without complication (HCC)    Gout    Hypertension    ILD (interstitial lung disease) ([place])    by CT 01/2021   Mild aortic stenosis    Mild dilation of ascending aorta (HCC)    Osteoporosis    Past Surgical History:  Procedure Laterality Date   CHOLECYSTECTOMY N/A 07/04/2018   Procedure: LAPAROSCOPIC CHOLECYSTECTOMY WITH INTRAOPERATIVE CHOLANGIOGRAM;  Surgeon: Coralie Keens, MD;  Location: [facility];  Service: General;  Laterality: N/A;   ERCP N/A 07/05/2018   Procedure: ENDOSCOPIC RETROGRADE CHOLANGIOPANCREATOGRAPHY (ERCP);  Surgeon: Clarene Essex, MD;  Location: [facility];  Service: Endoscopy;  Laterality: N/A;   JOINT REPLACEMENT Bilateral    REMOVAL OF STONES  07/05/2018   Procedure: REMOVAL OF STONES;  Surgeon: Clarene Essex, MD;  Location: [facility];  Service: Endoscopy;;   SPHINCTEROTOMY  07/05/2018   Procedure: [facility];  Surgeon: Clarene Essex, MD;  Location: [facility];  Service: Endoscopy;;   Patient Active Problem List   Diagnosis Date Noted   Near syncope 02/06/2022   E. coli UTI 02/06/2022   Infrarenal abdominal aortic aneurysm (AAA) without rupture ([place]) 02/06/2022   Hypokalemia 02/06/2022   Normocytic anemia 02/06/2022   Coronary artery disease involving native coronary artery of native heart with angina pectoris ([place]) 04/14/2021   Heart murmur 04/14/2021   Nonspecific abnormal electrocardiogram (ECG) (EKG) 04/14/2021   Lower extremity edema 04/14/2021   Acute cholecystitis 07/04/2018   E coli bacteremia 04/11/2017   UTI (urinary tract infection) 04/10/2017   Sepsis ([place]) 04/10/2017    Type 2 diabetes mellitus without complication ([place]) [id number]   Essential hypertension 04/10/2017    PCP: Deland Pretty, MD  REFERRING PROVIDER: Deland Pretty, MD  REFERRING DIAG: R53.81 (ICD-10-CM) - Other malaise  THERAPY DIAG:  No diagnosis found.  Rationale for Evaluation and Treatment Rehabilitation  ONSET DATE: 2023  SUBJECTIVE:   SUBJECTIVE STATEMENT:  Goes by [name]   The patient continues to report right elbow soreness He reports it has been about the same.   Eval: Pt presents with wife today to session. 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   Pt states his main concern is strength and mobility. Pt states he has been retired since 1973 and states his R knee began to slow down his activity. He finally got it replaced but learned the habit of getting up and down with the assist of UE. He feels that he has a lot of trouble getting out of chairs without UE and feels that he could not get up off the floor without help. He feels he is slowly losing functional mobility. He feels that he is deconditioned at this time. He noticed he was not able to keep up with family during a family vacation that brought his attention to the issue. Pt has LE edema and wears compression hose. He is currently still grocery shopping and does a lot of the cooking at home. 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  Pt states he has to very conscous of walking and dragging  his foot. 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   PERTINENT HISTORY: Bilat TKA, osteoarthritis, stage IIIa CKD, recurrent UTI,coronary artery calcification, type II DM, gout, interstitial lung disease, mild aortic stenosis and mild ascending dilatation, osteoporosis  PAIN:   Are you having pain? No Pain location: upper back, feet neuropathy   PRECAUTIONS: None  WEIGHT BEARING RESTRICTIONS No  FALLS:  Has patient fallen in last 6 months? Yes. Number of  falls 1, tripped over a tree root, cuts and scraps  LIVING ENVIRONMENT: Lives with: lives with their family and lives with their spouse Lives in: House/apartment Stairs: Yes, steps to enter Has following equipment at home: None  OCCUPATION: retired from [employer]  PLOF: Independent  PATIENT GOALS : improve functional mobility   OBJECTIVE:   DIAGNOSTIC FINDINGS: N/A orthopedics  PATIENT SURVEYS:  FOTO 47 51 pts DC 6 pts MCII  FOTO 11/13   TODAY'S TREATMENT: 11/15 Nu Step 6 min L6  Single leg shuttle leg press 25lbs 3x10 (focus on getting more R knee flexion) -fwd step ups onto airex (two finger support needed) 2x10 -seated LAQ 5lbs 3x10 each LE - Sit to Stand (focus on power/RFD)  x15 - standing march 2x20 with knees touching bar (focus on hip flexion and foot clearance) - 6" box latreal step up - 2x10 -8" box toe taps 2x20 - Hurdle side step x10 each direction  - Squats x20  - rock on air-ex x20  - hurdle ste over x20    11/13  Nu-step L4 6 min  Single leg shuttle leg press 25lbs 3x10 (focus on getting more R knee flexion) -fwd step ups onto airex (two finger support needed) 2x10 -seated LAQ 5lbs 3x10 each LE - Sit to Stand (focus on power/RFD)  3 sets - 5 reps - standing march 2x20 with knees touching bar (focus on hip flexion and foot clearance) - 6" box latreal step up - 2x10 -8" box toe taps 2x20 -stair step knee flexion stretch 30s 3x   11/8  Nu-step L4 6 min  Single leg shuttle leg press 50lbs 3x10 -fwd step ups onto airex (two finger support needed) 2x10 -narrow base 3x30 sec hold EC on foam. CGA   - Sit to Stand (focus on power/RFD)  3 sets - 5 reps - standing march 2x20 with knees touching bar (focus on hip flexion and foot clearance) - 6" box latreal step up - 2x10 -8" box toe taps 2x20 -stair step knee flexion stretch 10s 5x    11/1  Nu-step L4 6 min  Single leg shuttle leg press 50lbs 3x10 RTB at knees sidestep at bar 3x  laps -narrow base 3x30 sec hold EC on foam. CGA   - Sit to Stand with Armchair with YTB at knees  3 sets - 5 reps - Heel Raises with Counter Support  -2 sets - 10 reps - 6" box latreal step up - 2x10 -6" box toe taps 2x20 - 5x hurdles at bar fwd and lateral 2x laps each   10/30 Nu-step L4 6 min  life fitness leg press 75lbs 2x10 YTB at knees sidestep at bar 3x laps -narrow base 3x30 sec hold eyes closed. Min a first trial CGA subsequent trials  - Sit to Stand with Armchair with YTB at knees  3 sets - 5 reps - Heel Raises with Counter Support  -2 sets - 10 reps - 6" box step up - 2x10 - Standing slow march x15     PATIENT  EDUCATION:  Education details:  exercise progression, daily walking, transfer mechanics, safety/falls prevention, HEP, POC Person educated: Patient Education method: Explanation, Demonstration, Tactile cues, Verbal cues, and Handouts Education comprehension: verbalized understanding, returned demonstration, verbal cues required, and tactile cues required     HOME EXERCISE PROGRAM:  Access Code: [email or web address] URL: [email or web address] Date: 03/05/2022 Prepared by: Daleen Bo    ASSESSMENT:   CLINICAL IMPRESSION: Patient tolerated treatment well.  Therapy focused on balance strengthening and coordination exercises.  He did come in with baseline pain in his elbows we avoided elbow activity.  He did not recall report any pain or fatigue with treatment.  Therapy will continue to progress as tolerated. OBJECTIVE IMPAIRMENTS Abnormal gait, decreased activity tolerance, decreased balance, decreased endurance, decreased mobility, difficulty walking, decreased ROM, decreased strength, hypomobility, increased muscle spasms, impaired flexibility, improper body mechanics, and postural dysfunction.    ACTIVITY LIMITATIONS carrying, squatting, stairs, transfers, bed mobility, bathing, toileting, dressing, and locomotion level   PARTICIPATION LIMITATIONS: meal  prep, cleaning, laundry, driving, shopping, community activity, and yard work   [place], Past/current experiences, Time since onset of injury/illness/exacerbation, and 3+ comorbidities:    are also affecting patient's functional outcome.    REHAB POTENTIAL: Fair   CLINICAL DECISION MAKING: Unstable/changing   EVALUATION COMPLEXITY: Moderate     GOALS: SHORT TERM GOALS: Target date: 04/27/2022      Pt will become independent with HEP in order to demonstrate synthesis of PT education.   Goal status: INITIAL   2.  Pt will score at least 6 pt increase on FOTO to demonstrate functional improvement in MCII and pt perceived function.     Goal status: INITIAL 3. Pt will be able to demonstrate/report ability to walk >15 mins without pain in order to demonstrate functional improvement and tolerance to exercise and community mobility.  Baseline:  Goal status: INITIAL        LONG TERM GOALS: Target date: 06/08/2022       Pt  will become independent with final HEP in order to demonstrate synthesis of PT education.   Goal status: INITIAL   2.   Pt will be able to walk at least an increase of >/= 165 ft during 6MWT in order to demonstrate improvement in functional endurance and mobility for decrease in all cause mortality.    Goal status: INITIAL   3.   Pt will be able to perform 5XSTS in under 12s  in order to demonstrate functional improvement above the cut off score for adults. .  Goal status: INITIAL   4.   Pt will score >/= 51 on FOTO to demonstrate improvement in perceived LE/balance function.  Goal status: INITIAL       PLAN: PT FREQUENCY: 1-2x/week   PT DURATION:  12 weeks (plan for D/C in 8-10)   PLANNED INTERVENTIONS: Therapeutic exercises, Therapeutic activity, Neuromuscular re-education, Balance training, Gait training, Patient/Family education, Self Care, Joint mobilization, Joint manipulation, Stair training, Orthotic/Fit training, DME  instructions, Aquatic Therapy, Dry Needling, Electrical stimulation, Wheelchair mobility training, Spinal manipulation, Spinal mobilization, Cryotherapy, Moist heat, Compression bandaging, scar mobilization, Splintting, Taping, Vasopneumatic device, Ultrasound, Ionotophoresis 4mg /ml Dexamethasone, Manual therapy, and Re-evaluation   PLAN FOR NEXT SESSION: general LE strength, Focus on R knee, endurance, balance    Carney Living, PT 04/01/2022, 3:18 PM

## 2022-04-05 NOTE — Progress Notes (Signed)
  Subjective:  Patient ID: [name] Steele, male    DOB: 10-19-1940,  MRN: [id number]  [name] Steele presents to clinic today for preventative diabetic foot care and painful elongated mycotic toenails 1-5 bilaterally which are tender when wearing enclosed shoe gear. Pain is relieved with periodic professional debridement.   He states he is in PT to gain strength and balance in his lower extremities. Chief Complaint  Patient presents with   Nail Problem    DFC  BG - pt does not recall  A1C - pt does not recall PCP - Dr [name] last OV October 2023   New problem(s): None.   PCP is [name], MD.  Allergies  Allergen Reactions   Erythromycin Other (See Comments)    Severe Abdominal Pain   Penicillin G Other (See Comments)   Penicillins Hives    Has patient had a PCN reaction causing immediate rash, facial/tongue/throat swelling, SOB or lightheadedness with hypotension:  Yes  Has patient had a PCN reaction causing severe rash involving mucus membranes or skin necrosis:  No Has patient had a PCN reaction that required hospitalization: No Has patient had a PCN reaction occurring within the last 10 years: No If all of the above answers are "NO", then may proceed with Cephalosporin use. Tolerated CTX 11/23 admit     Review of Systems: Negative except as noted in the HPI.  Objective:   [name] Steele is a pleasant 81 y.o. male WD, WN in NAD. AAO x 3.  Vascular Examination: CFT <3 seconds b/l. DP pulses palpable b/l. PT pulses faintly palpable b/l. Digital hair absent. Skin temperature gradient warm to warm b/l. No ischemia or gangrene. No cyanosis or clubbing noted b/l. Nonpitting edema noted bilateral ankles.   Neurological Examination: Sensation grossly intact b/l with 10 gram monofilament. Vibratory sensation intact b/l.   Dermatological Examination: Pedal skin warm and supple b/l. Toenails 1-5 b/l thick, discolored, elongated with subungual debris and pain on  dorsal palpation.    Hyperkeratotic lesion(s) submet head 5 left foot.  No erythema, no edema, no drainage, no fluctuance.  Musculoskeletal Examination: Muscle strength 5/5 to b/l LE. Hammertoe deformity noted 2-5 b/l. Plantar fat pad atrophy b/l lower extremities.  Radiographs: None Assessment/Plan: 1. Pain due to onychomycosis of toenails of both feet   2. Callus   3. Controlled type 2 diabetes mellitus without complication, without long-term current use of insulin (HCC)     No orders of the defined types were placed in this encounter.   -Consent given for treatment as described below: -Patient in PT for strengthening and balance of lower extremities. -Continue supportive shoe gear daily. -Toenails 1-5 b/l were debrided in length and girth with sterile nail nippers and dremel without iatrogenic bleeding.  -Callus(es) submet head 5 left foot pared utilizing sterile scalpel blade without complication or incident. Total number debrided =1. -Patient/POA to call should there be question/concern in the interim.   Return in about 3 months (around 07/01/2022).  Freddie Breech, DPM

## 2022-04-06 ENCOUNTER — Ambulatory Visit (HOSPITAL_COMMUNITY)
Admission: RE | Admit: 2022-04-06 | Discharge: 2022-04-06 | Disposition: A | Discharge status: Home / Self Care | Payer: Medicare Other | Source: Ambulatory Visit | Attending: Student | Admitting: Student

## 2022-04-06 DIAGNOSIS — J432 Centrilobular emphysema: Secondary | ICD-10-CM | POA: Diagnosis not present

## 2022-04-06 DIAGNOSIS — J849 Interstitial pulmonary disease, unspecified: Secondary | ICD-10-CM | POA: Insufficient documentation

## 2022-04-06 DIAGNOSIS — J479 Bronchiectasis, uncomplicated: Secondary | ICD-10-CM | POA: Diagnosis not present

## 2022-04-06 DIAGNOSIS — S2243XA Multiple fractures of ribs, bilateral, initial encounter for closed fracture: Secondary | ICD-10-CM | POA: Diagnosis not present

## 2022-04-06 DIAGNOSIS — J84112 Idiopathic pulmonary fibrosis: Secondary | ICD-10-CM | POA: Diagnosis not present

## 2022-04-07 ENCOUNTER — Encounter (HOSPITAL_BASED_OUTPATIENT_CLINIC_OR_DEPARTMENT_OTHER): Payer: Self-pay | Admitting: Physical Therapy

## 2022-04-07 ENCOUNTER — Ambulatory Visit (HOSPITAL_BASED_OUTPATIENT_CLINIC_OR_DEPARTMENT_OTHER): Payer: Medicare Other | Admitting: Physical Therapy

## 2022-04-07 DIAGNOSIS — R262 Difficulty in walking, not elsewhere classified: Secondary | ICD-10-CM | POA: Diagnosis not present

## 2022-04-07 DIAGNOSIS — M6281 Muscle weakness (generalized): Secondary | ICD-10-CM | POA: Diagnosis not present

## 2022-04-07 NOTE — Therapy (Signed)
OUTPATIENT PHYSICAL THERAPY LOWER EXTREMITY TREATMENT   Patient Name: [name] Steele MRN: [id number] DOB:01-15-1941, 81 y.o., male Today's Date: 04/08/2022   PT End of Session - 04/07/22 1529     Visit Number 9    Number of Visits 18    Date for PT Re-Evaluation 06/03/22    PT Start Time 1515    PT Stop Time 1557    PT Time Calculation (min) 42 min    Activity Tolerance Patient tolerated treatment well    Behavior During Therapy WFL for tasks assessed/performed                  Past Medical History:  Diagnosis Date   Arthritis    Chronic kidney disease, stage 3a (HCC)    Chronic urinary tract infection    Coronary artery calcification seen on CT scan    Diabetes mellitus without complication (HCC)    Gout    Hypertension    ILD (interstitial lung disease) (HCC)    by CT 01/2021   Mild aortic stenosis    Mild dilation of ascending aorta (HCC)    Osteoporosis    Past Surgical History:  Procedure Laterality Date   CHOLECYSTECTOMY N/A 07/04/2018   Procedure: LAPAROSCOPIC CHOLECYSTECTOMY WITH INTRAOPERATIVE CHOLANGIOGRAM;  Surgeon: Abigail Miyamoto, MD;  Location: [facility];  Service: General;  Laterality: N/A;   ERCP N/A 07/05/2018   Procedure: ENDOSCOPIC RETROGRADE CHOLANGIOPANCREATOGRAPHY (ERCP);  Surgeon: Vida Rigger, MD;  Location: [facility];  Service: Endoscopy;  Laterality: N/A;   JOINT REPLACEMENT Bilateral    REMOVAL OF STONES  07/05/2018   Procedure: REMOVAL OF STONES;  Surgeon: Vida Rigger, MD;  Location: [facility];  Service: Endoscopy;;   SPHINCTEROTOMY  07/05/2018   Procedure: Dennison Masc[place];  Surgeon: Vida Rigger, MD;  Location: [facility];  Service: Endoscopy;;   Patient Active Problem List   Diagnosis Date Noted   Near syncope 02/06/2022   E. coli UTI 02/06/2022   Infrarenal abdominal aortic aneurysm (AAA) without rupture (HCC) 02/06/2022   Hypokalemia 02/06/2022   Normocytic anemia 02/06/2022   Coronary artery disease involving native  coronary artery of native heart with angina pectoris (HCC) 04/14/2021   Heart murmur 04/14/2021   Nonspecific abnormal electrocardiogram (ECG) (EKG) 04/14/2021   Lower extremity edema 04/14/2021   Acute cholecystitis 07/04/2018   E coli bacteremia 04/11/2017   UTI (urinary tract infection) 04/10/2017   Sepsis (HCC) 04/10/2017   Type 2 diabetes mellitus without complication (HCC) 04/10/2017   Essential hypertension 04/10/2017    PCP: [name], MD  REFERRING PROVIDER: [name], MD  REFERRING DIAG: R53.81 (ICD-10-CM) - Other malaise  THERAPY DIAG:  Muscle weakness (generalized)  Difficulty walking  Rationale for Evaluation and Treatment Rehabilitation  ONSET DATE: 2023  SUBJECTIVE:   SUBJECTIVE STATEMENT:  Goes by [name]  The patient has no complaints today.  Eval: Pt presents with wife today to session. 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   Pt states his main concern is strength and mobility. Pt states he has been retired since 1973 and states his R knee began to slow down his activity. He finally got it replaced but learned the habit of getting up and down with the assist of UE. He feels that he has a lot of trouble getting out of chairs without UE and feels that he could not get up off the floor without help. He feels he is slowly losing functional mobility. He feels that he is deconditioned at this time. He noticed  he was not able to keep up with family during a family vacation that brought his attention to the issue. Pt has LE edema and wears compression hose. He is currently still grocery shopping and does a lot of the cooking at home. 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  Pt states he has to very conscous of walking and dragging his foot. 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   PERTINENT HISTORY: Bilat TKA, osteoarthritis, stage IIIa CKD, recurrent UTI,coronary artery calcification, type  II DM, gout, interstitial lung disease, mild aortic stenosis and mild ascending dilatation, osteoporosis  PAIN:   Are you having pain? No Pain location: upper back, feet neuropathy   PRECAUTIONS: None  WEIGHT BEARING RESTRICTIONS No  FALLS:  Has patient fallen in last 6 months? Yes. 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[employer]  PLOF: Independent  PATIENT GOALS : improve functional mobility   OBJECTIVE:   DIAGNOSTIC FINDINGS: N/A orthopedics  PATIENT SURVEYS:  FOTO 47 51 pts DC 6 pts MCII  FOTO 11/13   TODAY'S TREATMENT: 11/22 Nu Step 6 min L6  - 6" box latreal step up - 2x10 -8" box toe taps 2x20 - Hurdle side step x10 each direction  - Squats x20  - step onto air-ex x20  -Leg press 3x15 25 lbs life fitness  - hip abduction 3x15 50 lbs  - row 3x15 20lbs   11/15 Nu Step 6 min L6  Single leg shuttle leg press 25lbs 3x10 (focus on getting more R knee flexion) -fwd step ups onto airex (two finger support needed) 2x10 -seated LAQ 5lbs 3x10 each LE - Sit to Stand (focus on power/RFD)  x15 - standing march 2x20 with knees touching bar (focus on hip flexion and foot clearance) - 6" box latreal step up - 2x10 -8" box toe taps 2x20 - Hurdle side step x10 each direction  - Squats x20  - rock on air-ex x20  - hurdle ste over x20    11/13  Nu-step L4 6 min  Single leg shuttle leg press 25lbs 3x10 (focus on getting more R knee flexion) -fwd step ups onto airex (two finger support needed) 2x10 -seated LAQ 5lbs 3x10 each LE - Sit to Stand (focus on power/RFD)  3 sets - 5 reps - standing march 2x20 with knees touching bar (focus on hip flexion and foot clearance) - 6" box latreal step up - 2x10 -8" box toe taps 2x20 -stair  step knee flexion stretch 30s 3x   11/8  Nu-step L4 6 min  Single leg shuttle leg press 50lbs 3x10 -fwd step ups onto airex (two finger support needed) 2x10 -narrow base 3x30 sec hold EC on foam. CGA   - Sit to Stand (focus on power/RFD)  3 sets - 5 reps - standing march 2x20 with knees touching bar (focus on hip flexion and foot clearance) - 6" box latreal step up - 2x10 -8" box toe taps 2x20 -stair step knee flexion stretch 10s 5x    11/1  Nu-step L4 6 min  Single leg shuttle leg press 50lbs 3x10 RTB at knees sidestep at bar 3x laps -narrow base 3x30 sec hold EC on foam. CGA   - Sit  to Stand with Armchair with YTB at knees  3 sets - 5 reps - Heel Raises with Counter Support  -2 sets - 10 reps - 6" box latreal step up - 2x10 -6" box toe taps 2x20 - 5x hurdles at bar fwd and lateral 2x laps each   10/30 Nu-step L4 6 min  life fitness leg press 75lbs 2x10 YTB at knees sidestep at bar 3x laps -narrow base 3x30 sec hold eyes closed. Min a first trial CGA subsequent trials  - Sit to Stand with Armchair with YTB at knees  3 sets - 5 reps - Heel Raises with Counter Support  -2 sets - 10 reps - 6" box step up - 2x10 - Standing slow march x15     PATIENT EDUCATION:  Education details:  exercise progression, daily walking, transfer mechanics, safety/falls prevention, HEP, POC Person educated: Patient Education method: Explanation, Demonstration, Tactile cues, Verbal cues, and Handouts Education comprehension: verbalized understanding, returned demonstration, verbal cues required, and tactile cues required     HOME EXERCISE PROGRAM:  Access Code: [email or web address] URL: [email or web address] Date: 03/05/2022 Prepared by: Zebedee Iba    ASSESSMENT:   CLINICAL IMPRESSION: The patient tolerated treatment well. He had no increase in pain. We worked on balance and strengthening exercises. We were able to get gym to perform exercises without significant pain. We will  continue to progress balance and strengthening exercises as tolerated. He performed stair training today as well.   OBJECTIVE IMPAIRMENTS Abnormal gait, decreased activity tolerance, decreased balance, decreased endurance, decreased mobility, difficulty walking, decreased ROM, decreased strength, hypomobility, increased muscle spasms, impaired flexibility, improper body mechanics, and postural dysfunction.    ACTIVITY LIMITATIONS carrying, squatting, stairs, transfers, bed mobility, bathing, toileting, dressing, and locomotion level   PARTICIPATION LIMITATIONS: meal prep, cleaning, laundry, driving, shopping, community activity, and yard work   PERSONAL FACTORS [place], Past/current experiences, Time since onset of injury/illness/exacerbation, and 3+ comorbidities:    are also affecting patient's functional outcome.    REHAB POTENTIAL: Fair   CLINICAL DECISION MAKING: Unstable/changing   EVALUATION COMPLEXITY: Moderate     GOALS: SHORT TERM GOALS: Target date: 04/27/2022      Pt will become independent with HEP in order to demonstrate synthesis of PT education.   Goal status: INITIAL   2.  Pt will score at least 6 pt increase on FOTO to demonstrate functional improvement in MCII and pt perceived function.     Goal status: INITIAL 3. Pt will be able to demonstrate/report ability to walk >15 mins without pain in order to demonstrate functional improvement and tolerance to exercise and community mobility.  Baseline:  Goal status: INITIAL        LONG TERM GOALS: Target date: 06/08/2022       Pt  will become independent with final HEP in order to demonstrate synthesis of PT education.   Goal status: INITIAL   2.   Pt will be able to walk at least an increase of >/= 165 ft during in order to demonstrate improvement in functional endurance and mobility for decrease in all cause mortality.    Goal status: INITIAL   3.   Pt will be able to perform 5XSTS in under 12s  in  order to demonstrate functional improvement above the cut off score for adults. .  Goal status: INITIAL   4.   Pt will score >/= 51 on FOTO to demonstrate improvement in perceived LE/balance function.  Goal status: INITIAL  PLAN: PT FREQUENCY: 1-2x/week   PT DURATION:  12 weeks (plan for D/C in 8-10)   PLANNED INTERVENTIONS: Therapeutic exercises, Therapeutic activity, Neuromuscular re-education, Balance training, Gait training, Patient/Family education, Self Care, Joint mobilization, Joint manipulation, Stair training, Orthotic/Fit training, DME instructions, Aquatic Therapy, Dry Needling, Electrical stimulation, Wheelchair mobility training, Spinal manipulation, Spinal mobilization, Cryotherapy, Moist heat, Compression bandaging, scar mobilization, Splintting, Taping, Vasopneumatic device, Ultrasound, Ionotophoresis 4mg /ml Dexamethasone, Manual therapy, and Re-evaluation   PLAN FOR NEXT SESSION: general LE strength, Focus on R knee, endurance, balance    Dessie Comaavid J Livana Yerian, PT 04/08/2022, 8:00 AM

## 2022-04-14 ENCOUNTER — Ambulatory Visit (HOSPITAL_BASED_OUTPATIENT_CLINIC_OR_DEPARTMENT_OTHER): Payer: Medicare Other | Admitting: Physical Therapy

## 2022-04-14 DIAGNOSIS — M6281 Muscle weakness (generalized): Secondary | ICD-10-CM

## 2022-04-14 DIAGNOSIS — R262 Difficulty in walking, not elsewhere classified: Secondary | ICD-10-CM

## 2022-04-14 NOTE — Therapy (Signed)
OUTPATIENT PHYSICAL THERAPY LOWER EXTREMITY TREATMENT  Progress Note Reporting Period 03/09/2022 to 04/14/2022  See note below for Objective Data and Assessment of Progress/Goals.       Patient Name: [name] Steele MRN: [id number] DOB:12/24/1940, 81 y.o., male Today's Date: 04/08/2022   PT End of Session - 04/07/22 1529     Visit Number 9    Number of Visits 18    Date for PT Re-Evaluation 06/03/22    PT Start Time 1515    PT Stop Time 1557    PT Time Calculation (min) 42 min    Activity Tolerance Patient tolerated treatment well    Behavior During Therapy WFL for tasks assessed/performed                  Past Medical History:  Diagnosis Date   Arthritis    Chronic kidney disease, stage 3a (HCC)    Chronic urinary tract infection    Coronary artery calcification seen on CT scan    Diabetes mellitus without complication (HCC)    Gout    Hypertension    ILD (interstitial lung disease) (HCC)    by CT 01/2021   Mild aortic stenosis    Mild dilation of ascending aorta (HCC)    Osteoporosis    Past Surgical History:  Procedure Laterality Date   CHOLECYSTECTOMY N/A 07/04/2018   Procedure: LAPAROSCOPIC CHOLECYSTECTOMY WITH INTRAOPERATIVE CHOLANGIOGRAM;  Surgeon: Abigail MiyamotoBlackman, Douglas, MD;  Location: [facility];  Service: General;  Laterality: N/A;   ERCP N/A 07/05/2018   Procedure: ENDOSCOPIC RETROGRADE CHOLANGIOPANCREATOGRAPHY (ERCP);  Surgeon: Vida RiggerMagod, Marc, MD;  [facility];  Service: Endoscopy;  Laterality: N/A;   JOINT REPLACEMENT Bilateral    REMOVAL OF STONES  07/05/2018   Procedure: REMOVAL OF STONES;  Surgeon: Vida RiggerMagod, Marc, MD;  Location: [facility];  Service: Endoscopy;;   SPHINCTEROTOMY  07/05/2018   Procedure: Dennison MascotSPHINCTEROTOMY;  Surgeon: Vida RiggerMagod, Marc, MD;  Location: [facility];  Service: Endoscopy;;   Patient Active Problem List   Diagnosis Date Noted   Near syncope 02/06/2022   E. coli UTI 02/06/2022   Infrarenal abdominal aortic aneurysm (AAA)  without rupture (HCC) 02/06/2022   Hypokalemia 02/06/2022   Normocytic anemia 02/06/2022   Coronary artery disease involving native coronary artery of native heart with angina pectoris (HCC) 04/14/2021   Heart murmur 04/14/2021   Nonspecific abnormal electrocardiogram (ECG) (EKG) 04/14/2021   Lower extremity edema 04/14/2021   Acute cholecystitis 07/04/2018   E coli bacteremia 04/11/2017   UTI (urinary tract infection) 04/10/2017   Sepsis (HCC) 04/10/2017   Type 2 diabetes mellitus without complication (HCC) 04/10/2017   Essential hypertension 04/10/2017    PCP: Merri BrunettePharr, Walter, MD  REFERRING PROVIDER: Merri BrunettePharr, Walter, MD  REFERRING DIAG: R53.81 (ICD-10-CM) - Other malaise  THERAPY DIAG:  Muscle weakness (generalized)  Difficulty walking  Rationale for Evaluation and Treatment Rehabilitation  ONSET DATE: 2023  SUBJECTIVE:   SUBJECTIVE STATEMENT: The patient get down in a crawlspace.  He has been somewhat sore since that point.  He continues to report his biggest difficulty is balance and stairs. Goes by [name]  The patient has no complaints today.  Eval: Pt presents with wife today to session. 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   Pt states his main concern is strength and mobility. Pt states he has been retired since 1973 and states his R knee began to slow down his activity. He finally got it replaced but learned the habit of getting up and down with the  assist of UE. He feels that he has a lot of trouble getting out of chairs without UE and feels that he could not get up off the floor without help. He feels he is slowly losing functional mobility. He feels that he is deconditioned at this time. He noticed he was not able to keep up with family during a family vacation that brought his attention to the issue. Pt has LE edema and wears compression hose. He is currently still grocery shopping and does a lot of the cooking at home. 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  Pt states he has to very conscous of walking and dragging his foot. 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   PERTINENT HISTORY: Bilat TKA, osteoarthritis, stage IIIa CKD, recurrent UTI,coronary artery calcification, type II DM, gout, interstitial lung disease, mild aortic stenosis and mild ascending dilatation, osteoporosis  PAIN:   Are you having pain? No Pain location: upper back, feet neuropathy   PRECAUTIONS: None  WEIGHT BEARING RESTRICTIONS No  FALLS:  Has patient fallen in last 6 months? Yes. 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[employer]  PLOF: Independent  PATIENT GOALS : improve functional mobility   OBJECTIVE:   DIAGNOSTIC FINDINGS: N/A orthopedics  PATIENT SURVEYS:  FOTO 47 51 pts DC 6 pts MCII  FOTO 11/13  Balance : narrow base eyes open independent Eyes closed contact-guard  Tandem stance contact-guard Tandem stance eyes closed min assist for balance    TODAY'S TREATMENT: 11/28 NuStep 5 minutes level 2 Sit to stand 2 x 10 Seated: March green band 2 x 10 Hip abduction 2 x 15 green band Long arc quad 2 x 15 each leg green band  Balance: Hurdle side step x10 each direction  - Squats x20  - step onto air-ex x20    11/22 Nu Step 6 min L6  - 6" box latreal step up - 2x10 -8" box toe taps 2x20 - Hurdle side step x10 each direction  - Squats x20  - step onto air-ex x20  -Leg press 3x15 25 lbs life fitness  - hip abduction 3x15 50 lbs  - row 3x15 20lbs   11/15 Nu Step 6 min L6  Single leg shuttle leg press 25lbs 3x10 (focus on getting more R knee flexion) -fwd step ups onto airex (two finger support needed) 2x10 -seated LAQ 5lbs 3x10 each  LE - Sit to Stand (focus on power/RFD)  x15 - standing march 2x20 with knees touching bar (focus on hip flexion and foot clearance) - 6" box latreal step up - 2x10 -8" box toe taps 2x20 - Hurdle side step x10 each direction  - Squats x20  - rock on air-ex x20  - hurdle ste over x20    11/13  Nu-step L4 6 min  Single leg shuttle leg press 25lbs 3x10 (focus on getting more R knee flexion) -fwd step ups onto airex (two finger support needed) 2x10 -seated LAQ 5lbs 3x10 each LE - Sit to Stand (focus on power/RFD)  3 sets - 5 reps - standing march 2x20 with knees touching bar (focus on hip flexion and foot clearance) - 6" box latreal step up - 2x10 -8" box toe taps  2x20 -stair step knee flexion stretch 30s 3x   11/8  Nu-step L4 6 min  Single leg shuttle leg press 50lbs 3x10 -fwd step ups onto airex (two finger support needed) 2x10 -narrow base 3x30 sec hold EC on foam. CGA   - Sit to Stand (focus on power/RFD)  3 sets - 5 reps - standing march 2x20 with knees touching bar (focus on hip flexion and foot clearance) - 6" box latreal step up - 2x10 -8" box toe taps 2x20 -stair step knee flexion stretch 10s 5x    11/1  Nu-step L4 6 min  Single leg shuttle leg press 50lbs 3x10 RTB at knees sidestep at bar 3x laps -narrow base 3x30 sec hold EC on foam. CGA   - Sit to Stand with Armchair with YTB at knees  3 sets - 5 reps - Heel Raises with Counter Support  -2 sets - 10 reps - 6" box latreal step up - 2x10 -6" box toe taps 2x20 - 5x hurdles at bar fwd and lateral 2x laps each   10/30 Nu-step L4 6 min  life fitness leg press 75lbs 2x10 YTB at knees sidestep at bar 3x laps -narrow base 3x30 sec hold eyes closed. Min a first trial CGA subsequent trials  - Sit to Stand with Armchair with YTB at knees  3 sets - 5 reps - Heel Raises with Counter Support  -2 sets - 10 reps - 6" box step up - 2x10 - Standing slow march x15     PATIENT EDUCATION:  Education details:   exercise progression, daily walking, transfer mechanics, safety/falls prevention, HEP, POC Person educated: Patient Education method: Explanation, Demonstration, Tactile cues, Verbal cues, and Handouts Education comprehension: verbalized understanding, returned demonstration, verbal cues required, and tactile cues required     HOME EXERCISE PROGRAM:  Access Code: [email or web address] URL: [email or web address] Date: 03/05/2022 Prepared by: Zebedee Iba    ASSESSMENT:   CLINICAL IMPRESSION: Therapy continues to focus on a mix of balance and strengthening exercises.  Overall the patient feels like he is making progress.  He continues to feel like his ambulation distance is limited but improving.  He also continues to have some difficulty going up and down stairs.  We continue to work on stair training here.  Will continue physical therapy 2-week-4 to continue to work on balance and strengthening exercises.  We will also continue to develop his home exercise program.  Functionally at 81 he is still able to do activities like getting down in his crawl space.  He was advised that this is a high level activity for somebody his age.  He was advised if he continues to strengthen and work cannot work on his balance he will continue to be able to maintain his high-level mobility.  OBJECTIVE IMPAIRMENTS Abnormal gait, decreased activity tolerance, decreased balance, decreased endurance, decreased mobility, difficulty walking, decreased ROM, decreased strength, hypomobility, increased muscle spasms, impaired flexibility, improper body mechanics, and postural dysfunction.    ACTIVITY LIMITATIONS carrying, squatting, stairs, transfers, bed mobility, bathing, toileting, dressing, and locomotion level   PARTICIPATION LIMITATIONS: meal prep, cleaning, laundry, driving, shopping, community activity, and yard work   PERSONAL FACTORS Fitness, Past/current experiences, Time since onset of  injury/illness/exacerbation, and 3+ comorbidities:    are also affecting patient's functional outcome.    REHAB POTENTIAL: Fair   CLINICAL DECISION MAKING: Unstable/changing   EVALUATION COMPLEXITY: Moderate     GOALS: SHORT TERM GOALS: Target date: 04/27/2022      Pt will become  independent with HEP in order to demonstrate synthesis of PT education.   Goal status: INITIAL   2.  Pt will score at least 6 pt increase on FOTO to demonstrate functional improvement in MCII and pt perceived function.     Goal status: INITIAL 3. Pt will be able to demonstrate/report ability to walk >15 mins without pain in order to demonstrate functional improvement and tolerance to exercise and community mobility.  Baseline:  Goal status: INITIAL        LONG TERM GOALS: Target date: 06/08/2022       Pt  will become independent with final HEP in order to demonstrate synthesis of PT education.   Goal status: INITIAL   2.   Pt will be able to walk at least an increase of >/= 165 ft during in order to demonstrate improvement in functional endurance and mobility for decrease in all cause mortality.    Goal status: INITIAL   3.   Pt will be able to perform 5XSTS in under 12s  in order to demonstrate functional improvement above the cut off score for adults. .  Goal status: INITIAL   4.   Pt will score >/= 51 on FOTO to demonstrate improvement in perceived LE/balance function.  Goal status: INITIAL       PLAN: PT FREQUENCY: 1-2x/week   PT DURATION:  12 weeks (plan for D/C in 8-10)   PLANNED INTERVENTIONS: Therapeutic exercises, Therapeutic activity, Neuromuscular re-education, Balance training, Gait training, Patient/Family education, Self Care, Joint mobilization, Joint manipulation, Stair training, Orthotic/Fit training, DME instructions, Aquatic Therapy, Dry Needling, Electrical stimulation, Wheelchair mobility training, Spinal manipulation, Spinal mobilization, Cryotherapy, Moist heat,  Compression bandaging, scar mobilization, Splintting, Taping, Vasopneumatic device, Ultrasound, Ionotophoresis 4mg /ml Dexamethasone, Manual therapy, and Re-evaluation   PLAN FOR NEXT SESSION: general LE strength, Focus on R knee, endurance, balance    , PT 04/08/2022, 8:00 AM

## 2022-04-15 ENCOUNTER — Encounter (HOSPITAL_BASED_OUTPATIENT_CLINIC_OR_DEPARTMENT_OTHER): Payer: Self-pay | Admitting: Physical Therapy

## 2022-04-15 NOTE — Addendum Note (Signed)
Addended by: Dessie Coma on: 04/15/2022 01:08 PM   Modules accepted: Orders

## 2022-04-16 ENCOUNTER — Encounter (HOSPITAL_BASED_OUTPATIENT_CLINIC_OR_DEPARTMENT_OTHER): Payer: Self-pay | Admitting: Physical Therapy

## 2022-04-16 ENCOUNTER — Ambulatory Visit (HOSPITAL_BASED_OUTPATIENT_CLINIC_OR_DEPARTMENT_OTHER): Payer: Medicare Other | Admitting: Physical Therapy

## 2022-04-16 DIAGNOSIS — R262 Difficulty in walking, not elsewhere classified: Secondary | ICD-10-CM | POA: Diagnosis not present

## 2022-04-16 DIAGNOSIS — M6281 Muscle weakness (generalized): Secondary | ICD-10-CM

## 2022-04-16 NOTE — Therapy (Signed)
OUTPATIENT PHYSICAL THERAPY LOWER EXTREMITY TREATMENT  Progress Note Reporting Period 03/09/2022 to 04/14/2022  See note below for Objective Data and Assessment of Progress/Goals.       Patient Name: [name] Steele MRN: [id number] DOB:10/22/1940, 81 y.o., male Today's Date: 04/08/2022   PT End of Session - 04/07/22 1529     Visit Number 9    Number of Visits 18    Date for PT Re-Evaluation 06/03/22    PT Start Time 1515    PT Stop Time 1557    PT Time Calculation (min) 42 min    Activity Tolerance Patient tolerated treatment well    Behavior During Therapy WFL for tasks assessed/performed                  Past Medical History:  Diagnosis Date   Arthritis    Chronic kidney disease, stage 3a (HCC)    Chronic urinary tract infection    Coronary artery calcification seen on CT scan    Diabetes mellitus without complication (HCC)    Gout    Hypertension    ILD (interstitial lung disease) (HCC)    by CT 01/2021   Mild aortic stenosis    Mild dilation of ascending aorta (HCC)    Osteoporosis    Past Surgical History:  Procedure Laterality Date   CHOLECYSTECTOMY N/A 07/04/2018   Procedure: LAPAROSCOPIC CHOLECYSTECTOMY WITH INTRAOPERATIVE CHOLANGIOGRAM;  Surgeon: Abigail MiyamotoBlackman, Douglas, MD;  Location: [facility];  Service: General;  Laterality: N/A;   ERCP N/A 07/05/2018   Procedure: ENDOSCOPIC RETROGRADE CHOLANGIOPANCREATOGRAPHY (ERCP);  Surgeon: Vida RiggerMagod, Marc, MD;  [facility];  Service: Endoscopy;  Laterality: N/A;   JOINT REPLACEMENT Bilateral    REMOVAL OF STONES  07/05/2018   Procedure: REMOVAL OF STONES;  Surgeon: Vida RiggerMagod, Marc, MD;  Location: [facility];  Service: Endoscopy;;   SPHINCTEROTOMY  07/05/2018   Procedure: Dennison MascotSPHINCTEROTOMY;  Surgeon: Vida RiggerMagod, Marc, MD;  Location: [facility];  Service: Endoscopy;;   Patient Active Problem List   Diagnosis Date Noted   Near syncope 02/06/2022   E. coli UTI 02/06/2022   Infrarenal abdominal aortic aneurysm (AAA)  without rupture (HCC) 02/06/2022   Hypokalemia 02/06/2022   Normocytic anemia 02/06/2022   Coronary artery disease involving native coronary artery of native heart with angina pectoris (HCC) 04/14/2021   Heart murmur 04/14/2021   Nonspecific abnormal electrocardiogram (ECG) (EKG) 04/14/2021   Lower extremity edema 04/14/2021   Acute cholecystitis 07/04/2018   E coli bacteremia 04/11/2017   UTI (urinary tract infection) 04/10/2017   Sepsis (HCC) 04/10/2017   Type 2 diabetes mellitus without complication (HCC) 04/10/2017   Essential hypertension 04/10/2017    PCP: Merri BrunettePharr, Walter, MD  REFERRING PROVIDER: Merri BrunettePharr, Walter, MD  REFERRING DIAG: R53.81 (ICD-10-CM) - Other malaise  THERAPY DIAG:  Muscle weakness (generalized)  Difficulty walking  Rationale for Evaluation and Treatment Rehabilitation  ONSET DATE: 2023  SUBJECTIVE:  Patient reports he is generally achy otherwise has no major complaints. SUBJECTIVE STATEMENT: Goes by [name]  The patient has no complaints today.  Eval: Pt presents with wife today to session. 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   Pt states his main concern is strength and mobility. Pt states he has been retired since 1973 and states his R knee began to slow down his activity. He finally got it replaced but learned the habit of getting up and down with the assist of UE. He feels that he has a lot of trouble getting out of chairs without UE  and feels that he could not get up off the floor without help. He feels he is slowly losing functional mobility. He feels that he is deconditioned at this time. He noticed he was not able to keep up with family during a family vacation that brought his attention to the issue. Pt has LE edema and wears compression hose. He is currently still grocery shopping and does a lot of the cooking at home. 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  Pt states he has to very  conscous of walking and dragging his foot. 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   PERTINENT HISTORY: Bilat TKA, osteoarthritis, stage IIIa CKD, recurrent UTI,coronary artery calcification, type II DM, gout, interstitial lung disease, mild aortic stenosis and mild ascending dilatation, osteoporosis  PAIN:   Are you having pain? No Pain location: upper back, feet neuropathy   PRECAUTIONS: None  WEIGHT BEARING RESTRICTIONS No  FALLS:  Has patient fallen in last 6 months? Yes. 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[employer]  PLOF: Independent  PATIENT GOALS : improve functional mobility   OBJECTIVE:   DIAGNOSTIC FINDINGS: N/A orthopedics  PATIENT SURVEYS:  FOTO 47 51 pts DC 6 pts MCII  FOTO 11/13  Balance : narrow base eyes open independent Eyes closed contact-guard  Tandem stance contact-guard Tandem stance eyes closed min assist for balance    TODAY'S TREATMENT: 11/30 NuStep 5 minutes level 2 Sit to stand 2 x 10  Balance: Hurdle side step x10 each direction  - Squats x20  - step onto air-ex x20 4 and lateral  6 inch step 2 times times bilateral   Supine: Straight leg raise 2 x 10 Supine march 2 x 15 Hip abduction green band 2 x 15 Supine bridge 2 x 10 11/28 NuStep 5 minutes level 2 Sit to stand 2 x 10 Seated: March green band 2 x 10 Hip abduction 2 x 15 green band Long arc quad 2 x 15 each leg green band  Balance: Hurdle side step x10 each direction  - Squats x20  - step onto air-ex x20    11/22 Nu Step 6 min L6  - 6" box latreal step up - 2x10 -8" box toe taps 2x20 - Hurdle side step x10 each direction  - Squats x20  - step onto air-ex x20  -Leg press 3x15 25 lbs life fitness  - hip  abduction 3x15 50 lbs  - row 3x15 20lbs   11/15 Nu Step 6 min L6  Single leg shuttle leg press 25lbs 3x10 (focus on getting more R knee flexion) -fwd step ups onto airex (two finger support needed) 2x10 -seated LAQ 5lbs 3x10 each LE - Sit to Stand (focus on power/RFD)  x15 - standing march 2x20 with knees touching bar (focus on hip flexion and foot clearance) - 6" box latreal step up - 2x10 -8" box toe taps 2x20 - Hurdle side step x10 each direction  - Squats x20  - rock on air-ex x20  - hurdle ste over x20    11/13  Nu-step L4 6 min  Single leg shuttle leg press 25lbs 3x10 (focus on getting more R knee flexion) -fwd step ups onto airex (two finger  support needed) 2x10 -seated LAQ 5lbs 3x10 each LE - Sit to Stand (focus on power/RFD)  3 sets - 5 reps - standing march 2x20 with knees touching bar (focus on hip flexion and foot clearance) - 6" box latreal step up - 2x10 -8" box toe taps 2x20 -stair step knee flexion stretch 30s 3x   11/8  Nu-step L4 6 min  Single leg shuttle leg press 50lbs 3x10 -fwd step ups onto airex (two finger support needed) 2x10 -narrow base 3x30 sec hold EC on foam. CGA   - Sit to Stand (focus on power/RFD)  3 sets - 5 reps - standing march 2x20 with knees touching bar (focus on hip flexion and foot clearance) - 6" box latreal step up - 2x10 -8" box toe taps 2x20 -stair step knee flexion stretch 10s 5x    11/1  Nu-step L4 6 min  Single leg shuttle leg press 50lbs 3x10 RTB at knees sidestep at bar 3x laps -narrow base 3x30 sec hold EC on foam. CGA   - Sit to Stand with Armchair with YTB at knees  3 sets - 5 reps - Heel Raises with Counter Support  -2 sets - 10 reps - 6" box latreal step up - 2x10 -6" box toe taps 2x20 - 5x hurdles at bar fwd and lateral 2x laps each   10/30 Nu-step L4 6 min  life fitness leg press 75lbs 2x10 YTB at knees sidestep at bar 3x laps -narrow base 3x30 sec hold eyes closed. Min a first trial CGA  subsequent trials  - Sit to Stand with Armchair with YTB at knees  3 sets - 5 reps - Heel Raises with Counter Support  -2 sets - 10 reps - 6" box step up - 2x10 - Standing slow march x15     PATIENT EDUCATION:  Education details:  exercise progression, daily walking, transfer mechanics, safety/falls prevention, HEP, POC Person educated: Patient Education method: Explanation, Demonstration, Tactile cues, Verbal cues, and Handouts Education comprehension: verbalized understanding, returned demonstration, verbal cues required, and tactile cues required     HOME EXERCISE PROGRAM:  Access Code: [email or web address] URL: [email or web address] Date: 03/05/2022 Prepared by: Zebedee Iba    ASSESSMENT:   CLINICAL IMPRESSION:  Patient continues to report stairs and balance are his main concern.  Continue to work on stairs and balance with his exercise program.  He was encouraged to work on balance exercises and strengthening exercises at home.  We will continue to progress strengthening and balance over the next 2 weeks. OBJECTIVE IMPAIRMENTS Abnormal gait, decreased activity tolerance, decreased balance, decreased endurance, decreased mobility, difficulty walking, decreased ROM, decreased strength, hypomobility, increased muscle spasms, impaired flexibility, improper body mechanics, and postural dysfunction.    ACTIVITY LIMITATIONS carrying, squatting, stairs, transfers, bed mobility, bathing, toileting, dressing, and locomotion level   PARTICIPATION LIMITATIONS: meal prep, cleaning, laundry, driving, shopping, community activity, and yard work   PERSONAL FACTORS Fitness, Past/current experiences, Time since onset of injury/illness/exacerbation, and 3+ comorbidities:    are also affecting patient's functional outcome.    REHAB POTENTIAL: Fair   CLINICAL DECISION MAKING: Unstable/changing   EVALUATION COMPLEXITY: Moderate     GOALS: SHORT TERM GOALS: Target date: 04/27/2022       Pt will become independent with HEP in order to demonstrate synthesis of PT education.   Goal status: INITIAL   2.  Pt will score at least 6 pt increase on FOTO to demonstrate functional improvement in MCII and pt perceived function.  Goal status: INITIAL 3. Pt will be able to demonstrate/report ability to walk >15 mins without pain in order to demonstrate functional improvement and tolerance to exercise and community mobility.  Baseline:  Goal status: INITIAL        LONG TERM GOALS: Target date: 06/08/2022       Pt  will become independent with final HEP in order to demonstrate synthesis of PT education.   Goal status: INITIAL   2.   Pt will be able to walk at least an increase of >/= 165 ft during in order to demonstrate improvement in functional endurance and mobility for decrease in all cause mortality.    Goal status: INITIAL   3.   Pt will be able to perform 5XSTS in under 12s  in order to demonstrate functional improvement above the cut off score for adults. .  Goal status: INITIAL   4.   Pt will score >/= 51 on FOTO to demonstrate improvement in perceived LE/balance function.  Goal status: INITIAL       PLAN: PT FREQUENCY: 1-2x/week   PT DURATION:  12 weeks (plan for D/C in 8-10)   PLANNED INTERVENTIONS: Therapeutic exercises, Therapeutic activity, Neuromuscular re-education, Balance training, Gait training, Patient/Family education, Self Care, Joint mobilization, Joint manipulation, Stair training, Orthotic/Fit training, DME instructions, Aquatic Therapy, Dry Needling, Electrical stimulation, Wheelchair mobility training, Spinal manipulation, Spinal mobilization, Cryotherapy, Moist heat, Compression bandaging, scar mobilization, Splintting, Taping, Vasopneumatic device, Ultrasound, Ionotophoresis 4mg /ml Dexamethasone, Manual therapy, and Re-evaluation   PLAN FOR NEXT SESSION: general LE strength, Focus on R knee, endurance, balance    ,  PT 04/08/2022, 8:00 AM

## 2022-04-17 ENCOUNTER — Encounter (HOSPITAL_BASED_OUTPATIENT_CLINIC_OR_DEPARTMENT_OTHER): Payer: Self-pay | Admitting: Physical Therapy

## 2022-04-21 ENCOUNTER — Ambulatory Visit (HOSPITAL_BASED_OUTPATIENT_CLINIC_OR_DEPARTMENT_OTHER): Payer: Medicare Other | Admitting: Physical Therapy

## 2022-04-22 ENCOUNTER — Ambulatory Visit (HOSPITAL_BASED_OUTPATIENT_CLINIC_OR_DEPARTMENT_OTHER): Payer: Medicare Other | Attending: Internal Medicine | Admitting: Physical Therapy

## 2022-04-22 ENCOUNTER — Encounter (HOSPITAL_BASED_OUTPATIENT_CLINIC_OR_DEPARTMENT_OTHER): Payer: Self-pay | Admitting: Physical Therapy

## 2022-04-22 DIAGNOSIS — M6281 Muscle weakness (generalized): Secondary | ICD-10-CM | POA: Diagnosis not present

## 2022-04-22 DIAGNOSIS — R262 Difficulty in walking, not elsewhere classified: Secondary | ICD-10-CM | POA: Diagnosis not present

## 2022-04-22 NOTE — Therapy (Signed)
OUTPATIENT PHYSICAL THERAPY LOWER EXTREMITY TREATMENT      Patient Name: [name] Steele MRN: [id number] DOB:April 29, 1941, 81 y.o., male Today's Date: 04/23/2022   PT End of Session - 04/22/22 1440     Visit Number 12    Number of Visits 18    Date for PT Re-Evaluation 06/03/22    Authorization Type [employer]    PT Start Time 1436    PT Stop Time 1516    PT Time Calculation (min) 40 min    Equipment Utilized During Treatment Gait belt    Activity Tolerance Patient tolerated treatment well    Behavior During Therapy WFL for tasks assessed/performed                  Past Medical History:  Diagnosis Date   Arthritis    Chronic kidney disease, stage 3a (HCC)    Chronic urinary tract infection    Coronary artery calcification seen on CT scan    Diabetes mellitus without complication (HCC)    Gout    Hypertension    ILD (interstitial lung disease) (HCC)    by CT 01/2021   Mild aortic stenosis    Mild dilation of ascending aorta (HCC)    Osteoporosis    Past Surgical History:  Procedure Laterality Date   CHOLECYSTECTOMY N/A 07/04/2018   Procedure: LAPAROSCOPIC CHOLECYSTECTOMY WITH INTRAOPERATIVE CHOLANGIOGRAM;  Surgeon: Abigail Miyamoto, MD;  Location: [facility];  Service: General;  Laterality: N/A;   ERCP N/A 07/05/2018   Procedure: ENDOSCOPIC RETROGRADE CHOLANGIOPANCREATOGRAPHY (ERCP);  Surgeon: Vida Rigger, MD;  Location: [facility];  Service: Endoscopy;  Laterality: N/A;   JOINT REPLACEMENT Bilateral    REMOVAL OF STONES  07/05/2018   Procedure: REMOVAL OF STONES;  Surgeon: Vida Rigger, MD;  Location: [facility];  Service: Endoscopy;;   SPHINCTEROTOMY  07/05/2018   Procedure: Dennison Mascot;  Surgeon: Vida Rigger, MD;  Location: [facility];  Service: Endoscopy;;   Patient Active Problem List   Diagnosis Date Noted   Near syncope 02/06/2022   E. coli UTI 02/06/2022   Infrarenal abdominal aortic aneurysm (AAA) without rupture (HCC) 02/06/2022    Hypokalemia 02/06/2022   Normocytic anemia 02/06/2022   Coronary artery disease involving native coronary artery of native heart with angina pectoris (HCC) 04/14/2021   Heart murmur 04/14/2021   Nonspecific abnormal electrocardiogram (ECG) (EKG) 04/14/2021   Lower extremity edema 04/14/2021   Acute cholecystitis 07/04/2018   E coli bacteremia 04/11/2017   UTI (urinary tract infection) 04/10/2017   Sepsis (HCC) 04/10/2017   Type 2 diabetes mellitus without complication (HCC) 04/10/2017   Essential hypertension 04/10/2017    PCP: [name], MD  REFERRING PROVIDER: [name], MD  REFERRING DIAG: R53.81 (ICD-10-CM) - Other malaise  THERAPY DIAG:  Muscle weakness (generalized)  Difficulty walking  Rationale for Evaluation and Treatment Rehabilitation  ONSET DATE: 2023  SUBJECTIVE:  Patient reports he is generally achy otherwise has no major complaints. SUBJECTIVE STATEMENT: Goes by [name]  Pt denies any adverse effects after prior Rx.  Pt reports he is performing his HEP.  "Balance is my biggest issue".  Pt states he has a lot of difficulty with stairs also.  Pt reports he feels better overall.  Pt reports improved ambulation.  Pt denies any pain currently.    Eval: Pt presents with wife today to session. 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   Pt states his main concern is strength and mobility. Pt states he has been retired since 1973  and states his R knee began to slow down his activity. He finally got it replaced but learned the habit of getting up and down with the assist of UE. He feels that he has a lot of trouble getting out of chairs without UE and feels that he could not get up off the floor without help. He feels he is slowly losing functional mobility. He feels that he is deconditioned at this time. He noticed he was not able to keep up with family during a family vacation that brought his attention to the issue. Pt has LE edema and wears compression hose.  He is currently still grocery shopping and does a lot of the cooking at home. 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  Pt states he has to very conscous of walking and dragging his foot. 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   PERTINENT HISTORY: Bilat TKA, osteoarthritis, stage IIIa CKD, recurrent UTI,coronary artery calcification, type II DM, gout, interstitial lung disease, mild aortic stenosis and mild ascending dilatation, osteoporosis  PAIN:   Are you having pain? No Pain location: upper back, feet neuropathy   PRECAUTIONS: None  WEIGHT BEARING RESTRICTIONS No  FALLS:  Has patient fallen in last 6 months? Yes. 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[employer]  PLOF: Independent  PATIENT GOALS : improve functional mobility   OBJECTIVE:   DIAGNOSTIC FINDINGS: N/A orthopedics   TODAY'S TREATMENT:  Therapeutic Exercise: Reviewed current function, response to prior Rx, HEP compliance, and pain level.  Pt performed: NuStep x 5 minutes level 4 with bilat UE/LE Supine straight leg raise 2 x 10 Supine bridge 2 x 10  Therapeutic Activity: -Sit to stand 2 x 10 from elevated table -6 inch step up x10 reps bilateral   Neuro Re-ed Activities: - step onto air-ex x20 forward and lateral bilat -Sidestepping over 5 hurdles 2 reps each way with UE support -Amb fwd with step to gait 5 hurdles x 6 reps with UE support -Standing on floor with EC with FA x 20 sec and with FT 2x20 sec        PATIENT EDUCATION:  Education details:  exercise progression, exercise form, transfer mechanics, safety/falls prevention, and POC. Person educated: Patient Education method: Explanation,  Demonstration, Tactile cues, Verbal cues, and Handouts Education comprehension: verbalized understanding, returned demonstration, verbal cues required, and tactile cues required     HOME EXERCISE PROGRAM:  Access Code: [email or web address] URL: [email or web address] Date: 03/05/2022 Prepared by: Zebedee Iba    ASSESSMENT:   CLINICAL IMPRESSION:  Pt reports he is feeling better and has improved with ambulation.  Patient continues to report stairs and balance are his main concern.  PT worked on balance activities with pt and PT used gait belt and provided close supervision to CGA with balance activities.  Pt used UE support with hurdle exercises and had no LOB.  Pt performed exercises and activities well with cuing and instruction for correct form and foot placement.  Pt responded well to Rx having no c/o's after Rx.  He should benefit from cont skilled PT services to address ongoing goals, and improve strength, balance, and functional mobility.    OBJECTIVE IMPAIRMENTS Abnormal gait, decreased activity tolerance, decreased  balance, decreased endurance, decreased mobility, difficulty walking, decreased ROM, decreased strength, hypomobility, increased muscle spasms, impaired flexibility, improper body mechanics, and postural dysfunction.    ACTIVITY LIMITATIONS carrying, squatting, stairs, transfers, bed mobility, bathing, toileting, dressing, and locomotion level   PARTICIPATION LIMITATIONS: meal prep, cleaning, laundry, driving, shopping, community activity, and yard work   PERSONAL FACTORS Fitness, Past/current experiences, Time since onset of injury/illness/exacerbation, and 3+ comorbidities:    are also affecting patient's functional outcome.    REHAB POTENTIAL: Fair   CLINICAL DECISION MAKING: Unstable/changing   EVALUATION COMPLEXITY: Moderate     GOALS: SHORT TERM GOALS: Target date: 04/27/2022      Pt will become independent with HEP in order to demonstrate synthesis of PT  education.   Goal status: INITIAL   2.  Pt will score at least 6 pt increase on FOTO to demonstrate functional improvement in MCII and pt perceived function.     Goal status: INITIAL 3. Pt will be able to demonstrate/report ability to walk >15 mins without pain in order to demonstrate functional improvement and tolerance to exercise and community mobility.  Baseline:  Goal status: INITIAL        LONG TERM GOALS: Target date: 06/08/2022       Pt  will become independent with final HEP in order to demonstrate synthesis of PT education.   Goal status: INITIAL   2.   Pt will be able to walk at least an increase of >/= 165 ft during in order to demonstrate improvement in functional endurance and mobility for decrease in all cause mortality.    Goal status: INITIAL   3.   Pt will be able to perform 5XSTS in under 12s  in order to demonstrate functional improvement above the cut off score for adults. .  Goal status: INITIAL   4.   Pt will score >/= 51 on FOTO to demonstrate improvement in perceived LE/balance function.  Goal status: INITIAL       PLAN: PT FREQUENCY: 1-2x/week   PT DURATION:  12 weeks (plan for D/C in 8-10)   PLANNED INTERVENTIONS: Therapeutic exercises, Therapeutic activity, Neuromuscular re-education, Balance training, Gait training, Patient/Family education, Self Care, Joint mobilization, Joint manipulation, Stair training, Orthotic/Fit training, DME instructions, Aquatic Therapy, Dry Needling, Electrical stimulation, Wheelchair mobility training, Spinal manipulation, Spinal mobilization, Cryotherapy, Moist heat, Compression bandaging, scar mobilization, Splintting, Taping, Vasopneumatic device, Ultrasound, Ionotophoresis 4mg /ml Dexamethasone, Manual therapy, and Re-evaluation   PLAN FOR NEXT SESSION: general LE strength, Focus on R knee, endurance, balance    III PT, DPT 04/23/22 5:21 PM

## 2022-04-24 ENCOUNTER — Other Ambulatory Visit: Payer: Self-pay | Admitting: Physician Assistant

## 2022-04-27 ENCOUNTER — Ambulatory Visit (HOSPITAL_BASED_OUTPATIENT_CLINIC_OR_DEPARTMENT_OTHER): Payer: Medicare Other | Admitting: Physical Therapy

## 2022-04-27 ENCOUNTER — Encounter (HOSPITAL_BASED_OUTPATIENT_CLINIC_OR_DEPARTMENT_OTHER): Payer: Self-pay | Admitting: Physical Therapy

## 2022-04-27 DIAGNOSIS — R262 Difficulty in walking, not elsewhere classified: Secondary | ICD-10-CM

## 2022-04-27 DIAGNOSIS — M6281 Muscle weakness (generalized): Secondary | ICD-10-CM

## 2022-04-27 NOTE — Therapy (Signed)
OUTPATIENT PHYSICAL THERAPY LOWER EXTREMITY TREATMENT      Patient Name: [name] Steele MRN: [id number] DOB:[birth year], 81 y.o., male Today's Date: 04/27/2022   PT End of Session - 04/27/22 1505     Visit Number 13    Number of Visits 18    Date for PT Re-Evaluation 06/03/22    Authorization Type [employer]    PT Start Time 1507    PT Stop Time 1552    PT Time Calculation (min) 45 min    Activity Tolerance Patient tolerated treatment well    Behavior During Therapy WFL for tasks assessed/performed                   Past Medical History:  Diagnosis Date   Arthritis    Chronic kidney disease, stage 3a (HCC)    Chronic urinary tract infection    Coronary artery calcification seen on CT scan    Diabetes mellitus without complication (HCC)    Gout    Hypertension    ILD (interstitial lung disease) (HCC)    by CT 01/2021   Mild aortic stenosis    Mild dilation of ascending aorta (HCC)    Osteoporosis    Past Surgical History:  Procedure Laterality Date   CHOLECYSTECTOMY N/A 07/04/2018   Procedure: LAPAROSCOPIC CHOLECYSTECTOMY WITH INTRAOPERATIVE CHOLANGIOGRAM;  Surgeon: Abigail Miyamoto, MD;  Location: [facility];  Service: General;  Laterality: N/A;   ERCP N/A 07/05/2018   Procedure: ENDOSCOPIC RETROGRADE CHOLANGIOPANCREATOGRAPHY (ERCP);  Surgeon: Vida Rigger, MD;  Location: [facility];  Service: Endoscopy;  Laterality: N/A;   JOINT REPLACEMENT Bilateral    REMOVAL OF STONES  07/05/2018   Procedure: REMOVAL OF STONES;  Surgeon: Vida Rigger, MD;  Location: [facility];  Service: Endoscopy;;   SPHINCTEROTOMY  07/05/2018   Procedure: Dennison Mascot;  Surgeon: Vida Rigger, MD;  Location: [facility];  Service: Endoscopy;;   Patient Active Problem List   Diagnosis Date Noted   Near syncope 02/06/2022   E. coli UTI 02/06/2022   Infrarenal abdominal aortic aneurysm (AAA) without rupture (HCC) 02/06/2022   Hypokalemia 02/06/2022   Normocytic anemia  02/06/2022   Coronary artery disease involving native coronary artery of native heart with angina pectoris (HCC) 04/14/2021   Heart murmur 04/14/2021   Nonspecific abnormal electrocardiogram (ECG) (EKG) 04/14/2021   Lower extremity edema 04/14/2021   Acute cholecystitis 07/04/2018   E coli bacteremia 04/11/2017   UTI (urinary tract infection) 04/10/2017   Sepsis (HCC) 04/10/2017   Type 2 diabetes mellitus without complication (HCC) 04/10/2017   Essential hypertension 04/10/2017    PCP: [name], MD  REFERRING PROVIDER: [name], MD  REFERRING DIAG: R53.81 (ICD-10-CM) - Other malaise  THERAPY DIAG:  Muscle weakness (generalized)  Difficulty walking  Rationale for Evaluation and Treatment Rehabilitation  ONSET DATE: 2023  SUBJECTIVE:  Patient reports he is generally achy otherwise has no major complaints. SUBJECTIVE STATEMENT: Goes by [name] Steele   I'm feeling OK today could be worse. Let's keep working on strength and balance. Hearing aides have been acting up, my appointment to get them checked is at the end of December.   Eval: Pt presents with wife today to session. 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   Pt states his main concern is strength and mobility. Pt states he has been retired since 1973 and states his R knee began to slow down his activity. He finally got it replaced but learned the habit of getting up and down with the assist  of UE. He feels that he has a lot of trouble getting out of chairs without UE and feels that he could not get up off the floor without help. He feels he is slowly losing functional mobility. He feels that he is deconditioned at this time. He noticed he was not able to keep up with family during a family vacation that brought his attention to the issue. Pt has LE edema and wears compression hose. He is currently still grocery shopping and does a lot of the cooking at home. 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  Pt states he has to very conscous of walking and dragging his foot. 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   PERTINENT HISTORY: Bilat TKA, osteoarthritis, stage IIIa CKD, recurrent UTI,coronary artery calcification, type II DM, gout, interstitial lung disease, mild aortic stenosis and mild ascending dilatation, osteoporosis  PAIN:   Are you having pain? No Pain location: none    PRECAUTIONS: None and Other: HOH   WEIGHT BEARING RESTRICTIONS No  FALLS:  Has patient fallen in last 6 months? Yes. 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department  PLOF: Independent  PATIENT GOALS : improve functional mobility   OBJECTIVE:   DIAGNOSTIC FINDINGS: N/A orthopedics   TODAY'S TREATMENT:   04/27/22  TherEx  Nustep L5 x6 minutes BLEs only Supine staggered bridges (progressing to single leg) x10 B Sidelying hip ABD x10 B  STS with 10# KB x10 cues to not "plop" Farmers carry 10# KB  2 rounds Forward and lateral step ups 6 inch box x10 B   NMR  Standing on blue foam pad EC 3x30 seconds Standing on blue foam pad EO narrow BOS 3x30 seconds Marches on blue foam pad x20  Tandem stance on blue foam pad 3x30 seconds intermittent ModA    PATIENT EDUCATION:  Education details:   exercise form and purpose  Person educated: Patient Education method: Programmer, multimedia, Demonstration, Actor cues, Verbal cues, and Handouts Education comprehension: verbalized understanding, returned demonstration, verbal cues required, and tactile cues required     HOME EXERCISE PROGRAM:  Access Code: [email or web address] URL: [email or web address] Date: 03/05/2022 Prepared by: Zebedee Iba    ASSESSMENT:   CLINICAL  IMPRESSION:  [name] Steele arrives today doing OK, very pleasant and cooperative. Continued focus on functional strengthening and balance per his request, progressed all interventions as able and tolerated today. Did need up to [facility] for balance due to posterior LOB especially when repositioning feet on blue foam pad. Will continue efforts.   OBJECTIVE IMPAIRMENTS Abnormal gait, decreased activity tolerance, decreased balance, decreased endurance, decreased mobility, difficulty walking, decreased ROM, decreased strength, hypomobility, increased muscle spasms, impaired flexibility, improper body mechanics, and postural dysfunction.    ACTIVITY LIMITATIONS carrying, squatting, stairs, transfers, bed mobility, bathing, toileting, dressing, and locomotion level   PARTICIPATION LIMITATIONS: meal prep, cleaning, laundry, driving, shopping, community activity, and yard work   PERSONAL FACTORS Fitness, Past/current experiences, Time since onset of injury/illness/exacerbation, and 3+ comorbidities:    are also affecting patient's functional outcome.    REHAB POTENTIAL: Fair   CLINICAL DECISION MAKING: Unstable/changing   EVALUATION COMPLEXITY: Moderate     GOALS: SHORT TERM GOALS: Target date: 04/27/2022  Pt will become independent with HEP in order to demonstrate synthesis of PT education.   Goal status: INITIAL   2.  Pt will score at least 6 pt increase on FOTO to demonstrate functional improvement in MCII and pt perceived function.     Goal status: INITIAL 3. Pt will be able to demonstrate/report ability to walk >15 mins without pain in order to demonstrate functional improvement and tolerance to exercise and community mobility.  Baseline:  Goal status: INITIAL        LONG TERM GOALS: Target date: 06/08/2022       Pt  will become independent with final HEP in order to demonstrate synthesis of PT education.   Goal status: INITIAL   2.   Pt will be able to walk at least an increase of  >/= 165 ft during in order to demonstrate improvement in functional endurance and mobility for decrease in all cause mortality.    Goal status: INITIAL   3.   Pt will be able to perform 5XSTS in under 12s  in order to demonstrate functional improvement above the cut off score for adults. .  Goal status: INITIAL   4.   Pt will score >/= 51 on FOTO to demonstrate improvement in perceived LE/balance function.  Goal status: INITIAL       PLAN: PT FREQUENCY: 1-2x/week   PT DURATION:  12 weeks (plan for D/C in 8-10)   PLANNED INTERVENTIONS: Therapeutic exercises, Therapeutic activity, Neuromuscular re-education, Balance training, Gait training, Patient/Family education, Self Care, Joint mobilization, Joint manipulation, Stair training, Orthotic/Fit training, DME instructions, Aquatic Therapy, Dry Needling, Electrical stimulation, Wheelchair mobility training, Spinal manipulation, Spinal mobilization, Cryotherapy, Moist heat, Compression bandaging, scar mobilization, Splintting, Taping, Vasopneumatic device, Ultrasound, Ionotophoresis 4mg /ml Dexamethasone, Manual therapy, and Re-evaluation   PLAN FOR NEXT SESSION: general LE strength, Focus on R knee, endurance, balance   Raliegh Scobie U PT DPT PN2

## 2022-04-29 ENCOUNTER — Other Ambulatory Visit (HOSPITAL_COMMUNITY): Payer: BLUE CROSS/BLUE SHIELD

## 2022-04-30 ENCOUNTER — Ambulatory Visit (HOSPITAL_BASED_OUTPATIENT_CLINIC_OR_DEPARTMENT_OTHER): Payer: Medicare Other | Admitting: Physical Therapy

## 2022-04-30 ENCOUNTER — Encounter (HOSPITAL_BASED_OUTPATIENT_CLINIC_OR_DEPARTMENT_OTHER): Payer: Self-pay | Admitting: Physical Therapy

## 2022-04-30 DIAGNOSIS — R262 Difficulty in walking, not elsewhere classified: Secondary | ICD-10-CM | POA: Diagnosis not present

## 2022-04-30 DIAGNOSIS — M6281 Muscle weakness (generalized): Secondary | ICD-10-CM | POA: Diagnosis not present

## 2022-04-30 NOTE — Therapy (Signed)
OUTPATIENT PHYSICAL THERAPY LOWER EXTREMITY TREATMENT/RE-EVAL       Patient Name: [name] Steele MRN: [id number] DOB:July 22, 1940, 81 y.o., male Today's Date: 04/30/2022   PT End of Session - 04/30/22 1059     Visit Number 14    Number of Visits 18    Date for PT Re-Evaluation 06/03/22    Authorization Type Medical Care/[employer]    PT Start Time 1101    PT Stop Time 1143    PT Time Calculation (min) 42 min    Activity Tolerance Patient tolerated treatment well    Behavior During Therapy WFL for tasks assessed/performed                    Past Medical History:  Diagnosis Date   Arthritis    Chronic kidney disease, stage 3a ([place])    Chronic urinary tract infection    Coronary artery calcification seen on CT scan    Diabetes mellitus without complication ([place])    Gout    Hypertension    ILD (interstitial lung disease) ([place])    by CT 01/2021   Mild aortic stenosis    Mild dilation of ascending aorta ([place])    Osteoporosis    Past Surgical History:  Procedure Laterality Date   CHOLECYSTECTOMY N/A 07/04/2018   Procedure: LAPAROSCOPIC CHOLECYSTECTOMY WITH INTRAOPERATIVE CHOLANGIOGRAM;  Surgeon: Coralie Keens, MD;  Location: [facility];  Service: General;  Laterality: N/A;   ERCP N/A 07/05/2018   Procedure: ENDOSCOPIC RETROGRADE CHOLANGIOPANCREATOGRAPHY (ERCP);  Surgeon: Clarene Essex, MD;  Location: [facility];  Service: Endoscopy;  Laterality: N/A;   JOINT REPLACEMENT Bilateral    REMOVAL OF STONES  07/05/2018   Procedure: REMOVAL OF STONES;  Surgeon: Clarene Essex, MD;  Location: [facility];  Service: Endoscopy;;   SPHINCTEROTOMY  07/05/2018   Procedure: Joan Mayans;  Surgeon: Clarene Essex, MD;  Location: [facility];  Service: Endoscopy;;   Patient Active Problem List   Diagnosis Date Noted   Near syncope 02/06/2022   E. coli UTI 02/06/2022   Infrarenal abdominal aortic aneurysm (AAA) without rupture ([place]) 02/06/2022   Hypokalemia 02/06/2022    Normocytic anemia 02/06/2022   Coronary artery disease involving native coronary artery of native heart with angina pectoris ([place]) 04/14/2021   Heart murmur 04/14/2021   Nonspecific abnormal electrocardiogram (ECG) (EKG) 04/14/2021   Lower extremity edema 04/14/2021   Acute cholecystitis 07/04/2018   E coli bacteremia 04/11/2017   UTI (urinary tract infection) 04/10/2017   Sepsis ([place]) 04/10/2017   Type 2 diabetes mellitus without complication ([place]) 76/72/0947   Essential hypertension 04/10/2017    PCP: Deland Pretty, MD  REFERRING PROVIDER: Deland Pretty, MD  REFERRING DIAG: R53.81 (ICD-10-CM) - Other malaise  THERAPY DIAG:  Muscle weakness (generalized)  Difficulty walking  Rationale for Evaluation and Treatment Rehabilitation  ONSET DATE: 2023  SUBJECTIVE:   SUBJECTIVE STATEMENT: Goes by [name] Steele   I'm "meh" today. I'm noticing maybe when we fix one problem here we create one problem somewhere else. I mean as we fix the top part I notice my feet are getting more wobbly. I might get a transplant for them.   Eval: Pt presents with wife today to session. 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   Pt states his main concern is strength and mobility. Pt states he has been retired since 1973 and states his R knee began to slow down his activity. He finally got it replaced but learned the habit of getting up and down with the  assist of UE. He feels that he has a lot of trouble getting out of chairs without UE and feels that he could not get up off the floor without help. He feels he is slowly losing functional mobility. He feels that he is deconditioned at this time. He noticed he was not able to keep up with family during a family vacation that brought his attention to the issue. Pt has LE edema and wears compression hose. He is currently still grocery shopping and does a lot of the cooking at home. Pt drives a pickup truck currently but has a lot of difficulty getting out his  wife's low car.  Pt states he has to very conscous of walking and dragging his foot. 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   PERTINENT HISTORY: Bilat TKA, osteoarthritis, stage IIIa CKD, recurrent UTI,coronary artery calcification, type II DM, gout, interstitial lung disease, mild aortic stenosis and mild ascending dilatation, osteoporosis  PAIN:   Are you having pain? No Pain location: none    PRECAUTIONS: None and Other: HOH   WEIGHT BEARING RESTRICTIONS No  FALLS:  Has patient fallen in last 6 months? Yes. 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department  PLOF: [place] : improve functional mobility   OBJECTIVE:   DIAGNOSTIC FINDINGS: N/A orthopedics   TODAY'S TREATMENT:  04/30/22   Objective Measures   MMT  Ankle DF: 5/5 B Knee extension 4+/5 B, knee flexion R 4/5 L 4+/5 Hip flexion 3-/5 B, hip ABD R 3/5, 4-/5 L   5xSTS 18.4 seconds use of U UE   6MWT 877f no device   Berg 44  NMR  SLS with one foot on 6 inch box 2x30 seconds B  Alternating marches on 6 inch box x30    04/27/22  TherEx  Nustep L5 x6 minutes BLEs only Supine staggered bridges (progressing to single leg) x10 B Sidelying hip ABD x10 B  STS with 10# KB x10 cues to not "plop" Farmers carry 10# KB  2 rounds Forward and lateral step ups 6 inch box x10 B   NMR  Standing on blue foam pad EC 3x30 seconds Standing on blue foam pad EO narrow BOS 3x30 seconds Marches on blue foam pad x20  Tandem stance on blue foam pad 3x30 seconds intermittent ModA    PATIENT EDUCATION:  Education details:   reassessment findings, POC moving forward, importance of reducing fall risk/improving balance Person educated:  Patient Education method: Explanation, Demonstration, Tactile cues, Verbal cues, and Handouts Education comprehension: verbalized understanding, returned demonstration, verbal cues required, and tactile cues required     HOME EXERCISE PROGRAM:  Access Code: [email or web address] URL: [email or web address] Date: 03/05/2022 Prepared by: ADaleen Bo   ASSESSMENT:   CLINICAL IMPRESSION:  [name] Steele arrives today doing OK, very pleasant and cooperative. Noted that today is last scheduled session and appears to be about 8 weeks out from start of care, so focused on getting fresh objective measures and clarifying rest of POC. Unfortunately today's measures were very similar to those from initial eval, may be close to having maximized benefit from skilled PT services.  Balance continues to be a major issue and I certainly recommend that he continue with [place] reduce fall risk as much as possible  moving forward. Extending POC for increased focus on balance, goals updated accordingly.   OBJECTIVE IMPAIRMENTS Abnormal gait, decreased activity tolerance, decreased balance, decreased endurance, decreased mobility, difficulty walking, decreased ROM, decreased strength, hypomobility, increased muscle spasms, impaired flexibility, improper body mechanics, and postural dysfunction.    ACTIVITY LIMITATIONS carrying, squatting, stairs, transfers, bed mobility, bathing, toileting, dressing, and locomotion level   PARTICIPATION LIMITATIONS: meal prep, cleaning, laundry, driving, shopping, community activity, and yard work   [place], Past/current experiences, Time since onset of injury/illness/exacerbation, and 3+ comorbidities:    are also affecting patient's functional outcome.    REHAB POTENTIAL: Fair   CLINICAL DECISION MAKING: Unstable/changing   EVALUATION COMPLEXITY: Moderate     GOALS: SHORT TERM GOALS: Target date: 04/27/2022      Pt will become independent with HEP in order to  demonstrate synthesis of PT education.   Goal status: 12/14- MET "doing them every day"    2.  Pt will score at least 6 pt increase on FOTO to demonstrate functional improvement in MCII and pt perceived function.     Goal status: 12/14- DNT   3. Pt will be able to demonstrate/report ability to walk >15 mins without pain in order to demonstrate functional improvement and tolerance to exercise and community mobility.  Baseline:  Goal status: MET- 12/14 per pt report         LONG TERM GOALS: Target date: 06/08/2022       Pt  will become independent with final HEP in order to demonstrate synthesis of PT education.   Goal status: 12/14- ONGOING    2.   Pt will be able to walk at least an increase of >/= 165 ft during 6MWT in order to demonstrate improvement in functional endurance and mobility for decrease in all cause mortality.    Goal status: 12/14- ONGOING   3.   Pt will be able to perform 5XSTS in under 12s  in order to demonstrate functional improvement above the cut off score for adults. .  Goal status: 12/14- ONGOING    4.   Pt will score >/= 51 on FOTO to demonstrate improvement in perceived LE/balance function.  Goal status: 12/14- DNT   5.  Will score 48 on Berg to show reduced fall risk  Baseline:  Goal status: INITIAL 12/14- 44         PLAN: PT FREQUENCY: 1-2x/week   PT DURATION:  12 weeks (plan for D/C in 8-10)   PLANNED INTERVENTIONS: Therapeutic exercises, Therapeutic activity, Neuromuscular re-education, Balance training, Gait training, Patient/Family education, Self Care, Joint mobilization, Joint manipulation, Stair training, Orthotic/Fit training, DME instructions, Aquatic Therapy, Dry Needling, Electrical stimulation, Wheelchair mobility training, Spinal manipulation, Spinal mobilization, Cryotherapy, Moist heat, Compression bandaging, scar mobilization, Splintting, Taping, Vasopneumatic device, Ultrasound, Ionotophoresis 53m/ml Dexamethasone, Manual  therapy, and Re-evaluation   PLAN FOR NEXT SESSION: focus on balance/balance additions to HEP, proximal strength. Likely DC in 4 sessions    Cartel Mauss U PT DPT PN2

## 2022-05-06 ENCOUNTER — Encounter (HOSPITAL_BASED_OUTPATIENT_CLINIC_OR_DEPARTMENT_OTHER): Payer: Self-pay | Admitting: Physical Therapy

## 2022-05-06 ENCOUNTER — Ambulatory Visit (HOSPITAL_BASED_OUTPATIENT_CLINIC_OR_DEPARTMENT_OTHER): Payer: Medicare Other | Admitting: Physical Therapy

## 2022-05-06 DIAGNOSIS — R262 Difficulty in walking, not elsewhere classified: Secondary | ICD-10-CM | POA: Diagnosis not present

## 2022-05-06 DIAGNOSIS — M6281 Muscle weakness (generalized): Secondary | ICD-10-CM | POA: Diagnosis not present

## 2022-05-06 NOTE — Therapy (Signed)
OUTPATIENT PHYSICAL THERAPY LOWER EXTREMITY TREATMENT      Patient Name: [name] Steele MRN: [id number] DOB:03/13/1941, 81 y.o., male Today's Date: 05/06/2022   PT End of Session - 05/06/22 1343     Visit Number 15    Number of Visits 18    Date for PT Re-Evaluation 06/03/22    Authorization Type Medical Care/[employer]    PT Start Time 1302    PT Stop Time 6195    PT Time Calculation (min) 41 min    Activity Tolerance Patient tolerated treatment well    Behavior During Therapy WFL for tasks assessed/performed                     Past Medical History:  Diagnosis Date   Arthritis    Chronic kidney disease, stage 3a ([place])    Chronic urinary tract infection    Coronary artery calcification seen on CT scan    Diabetes mellitus without complication ([facility])    Gout    Hypertension    ILD (interstitial lung disease) ([place])    by CT 01/2021   Mild aortic stenosis    Mild dilation of ascending aorta ([place])    Osteoporosis    Past Surgical History:  Procedure Laterality Date   CHOLECYSTECTOMY N/A 07/04/2018   Procedure: LAPAROSCOPIC CHOLECYSTECTOMY WITH INTRAOPERATIVE CHOLANGIOGRAM;  Surgeon: Coralie Keens, MD;  Location: [facility];  Service: General;  Laterality: N/A;   ERCP N/A 07/05/2018   Procedure: ENDOSCOPIC RETROGRADE CHOLANGIOPANCREATOGRAPHY (ERCP);  Surgeon: Clarene Essex, MD;  Location: [facility];  Service: Endoscopy;  Laterality: N/A;   JOINT REPLACEMENT Bilateral    REMOVAL OF STONES  07/05/2018   Procedure: REMOVAL OF STONES;  Surgeon: Clarene Essex, MD;  [facility];  Service: Endoscopy;;   SPHINCTEROTOMY  07/05/2018   Procedure: Joan Mayans;  Surgeon: Clarene Essex, MD;  [facility];  Service: Endoscopy;;   Patient Active Problem List   Diagnosis Date Noted   Near syncope 02/06/2022   E. coli UTI 02/06/2022   Infrarenal abdominal aortic aneurysm (AAA) without rupture ([place]) 02/06/2022   Hypokalemia 02/06/2022   Normocytic  anemia 02/06/2022   Coronary artery disease involving native coronary artery of native heart with angina pectoris ([place]) 04/14/2021   Heart murmur 04/14/2021   Nonspecific abnormal electrocardiogram (ECG) (EKG) 04/14/2021   Lower extremity edema 04/14/2021   Acute cholecystitis 07/04/2018   E coli bacteremia 04/11/2017   UTI (urinary tract infection) 04/10/2017   Sepsis ([place]) 04/10/2017   Type 2 diabetes mellitus without complication ([place]) 09/32/6712   Essential hypertension 04/10/2017    PCP: Deland Pretty, MD  REFERRING PROVIDER: Deland Pretty, MD  REFERRING DIAG: R53.81 (ICD-10-CM) - Other malaise  THERAPY DIAG:  Muscle weakness (generalized)  Difficulty walking  Rationale for Evaluation and Treatment Rehabilitation  ONSET DATE: 2023  SUBJECTIVE:   SUBJECTIVE STATEMENT: Goes by [name] Steele   I'm alright today, nothing new. My hearing aides are in the shop I don't know when they will be back.  Eval: Pt presents with wife today to session. 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   Pt states his main concern is strength and mobility. Pt states he has been retired since 1973 and states his R knee began to slow down his activity. He finally got it replaced but learned the habit of getting up and down with the assist of UE. He feels that he has a lot of trouble getting out of chairs without UE and feels that he  could not get up off the floor without help. He feels he is slowly losing functional mobility. He feels that he is deconditioned at this time. He noticed he was not able to keep up with family during a family vacation that brought his attention to the issue. Pt has LE edema and wears compression hose. He is currently still grocery shopping and does a lot of the cooking at home. 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  Pt states he has to very conscous of walking and dragging his foot. DM2 and bilateral neuropathy up to the  shin/knee.   PERTINENT HISTORY: Bilat TKA, osteoarthritis, stage IIIa CKD, recurrent UTI,coronary artery calcification, type II DM, gout, interstitial lung disease, mild aortic stenosis and mild ascending dilatation, osteoporosis  PAIN:   Are you having pain? No Pain location: none    PRECAUTIONS: None and Other: HOH   WEIGHT BEARING RESTRICTIONS No  FALLS:  Has patient fallen in last 6 months? Yes. 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department  PLOF: [place] : improve functional mobility   OBJECTIVE:   DIAGNOSTIC FINDINGS: N/A orthopedics   TODAY'S TREATMENT:  05/06/22  NMR   Tandem stance on foam 3x30 seconds B Narrow BOS on foam with head turns vertical x60 seconds, then head turns horizontal x60 seconds Standing EC blue foam pad 3x30 seconds  Forward step offs from blue foam pad x10 B, U UE support needed for stance on R LE due to unsteadiness  SLS with one foot on 6 inch box 3x30 second B solid surface Standing marches on blue foam pad x60 seconds Lateral step offs blue foam pad x10 B    04/30/22   Objective Measures   MMT  Ankle DF: 5/5 B Knee extension 4+/5 B, knee flexion R 4/5 L 4+/5 Hip flexion 3-/5 B, hip ABD R 3/5, 4-/5 L   5xSTS 18.4 seconds use of U UE   6MWT 812f no device   Berg 44  NMR  SLS with one foot on 6 inch box 2x30 seconds B  Alternating marches on 6 inch box x30    04/27/22  TherEx  Nustep L5 x6 minutes BLEs only Supine staggered bridges (progressing to single leg) x10 B Sidelying hip ABD x10 B  STS with 10# KB x10 cues to not "plop" Farmers carry 10# KB  2 rounds Forward and lateral step ups 6 inch box x10 B   NMR  Standing on blue foam pad EC 3x30  seconds Standing on blue foam pad EO narrow BOS 3x30 seconds Marches on blue foam pad x20  Tandem stance on blue foam pad 3x30 seconds intermittent ModA    PATIENT EDUCATION:  Education details:   reassessment findings, POC moving forward, importance of reducing fall risk/improving balance Person educated: Patient Education method: Explanation, Demonstration, Tactile cues, Verbal cues, and Handouts Education comprehension: verbalized understanding, returned demonstration, verbal cues required, and tactile cues required     HOME EXERCISE PROGRAM:  Access Code: [email or web address] URL: [email or web address] Date: 05/06/2022 Prepared by: KDeniece Ree Exercises - Sit to Stand with Armchair  - 2 x daily - 7 x weekly - 2 sets - 5 reps - Supine Bridge  -  2 x daily - 7 x weekly - 2 sets - 10 reps - Standing Marching  - 2 x daily - 7 x weekly - 1 sets - 20 reps - Tandem Stance in Corner  - 2 x daily - 7 x weekly - 1 sets - 3 reps - 30 hold - Single Leg Balance  - 2 x daily - 7 x weekly - 1 sets - 3 reps - 15 hold    ASSESSMENT:   CLINICAL IMPRESSION:  [name] Steele arrives today doing OK, could not hear me well today due to his hearing aides being in for repairs so had to write instructions for him which limited interventions this session/took a bit of extra time. Focused on balance interventions specifically this session as per POC, will continue efforts. Updated HEP to include some balance work today too.   OBJECTIVE IMPAIRMENTS Abnormal gait, decreased activity tolerance, decreased balance, decreased endurance, decreased mobility, difficulty walking, decreased ROM, decreased strength, hypomobility, increased muscle spasms, impaired flexibility, improper body mechanics, and postural dysfunction.    ACTIVITY LIMITATIONS carrying, squatting, stairs, transfers, bed mobility, bathing, toileting, dressing, and locomotion level   PARTICIPATION LIMITATIONS: meal prep, cleaning, laundry, driving,  shopping, community activity, and yard work   [place], Past/current experiences, Time since onset of injury/illness/exacerbation, and 3+ comorbidities:    are also affecting patient's functional outcome.    REHAB POTENTIAL: Fair   CLINICAL DECISION MAKING: Unstable/changing   EVALUATION COMPLEXITY: Moderate     GOALS: SHORT TERM GOALS: Target date: 04/27/2022      Pt will become independent with HEP in order to demonstrate synthesis of PT education.   Goal status: 12/14- MET "doing them every day"    2.  Pt will score at least 6 pt increase on FOTO to demonstrate functional improvement in MCII and pt perceived function.     Goal status: 12/14- DNT   3. Pt will be able to demonstrate/report ability to walk >15 mins without pain in order to demonstrate functional improvement and tolerance to exercise and community mobility.  Baseline:  Goal status: MET- 12/14 per pt report         LONG TERM GOALS: Target date: 06/08/2022       Pt  will become independent with final HEP in order to demonstrate synthesis of PT education.   Goal status: 12/14- ONGOING    2.   Pt will be able to walk at least an increase of >/= 165 ft during 6MWT in order to demonstrate improvement in functional endurance and mobility for decrease in all cause mortality.    Goal status: 12/14- ONGOING   3.   Pt will be able to perform 5XSTS in under 12s  in order to demonstrate functional improvement above the cut off score for adults. .  Goal status: 12/14- ONGOING    4.   Pt will score >/= 51 on FOTO to demonstrate improvement in perceived LE/balance function.  Goal status: 12/14- DNT   5.  Will score 48 on Berg to show reduced fall risk  Baseline:  Goal status: INITIAL 12/14- 44         PLAN: PT FREQUENCY: 1-2x/week   PT DURATION:  12 weeks (plan for D/C in 8-10)   PLANNED INTERVENTIONS: Therapeutic exercises, Therapeutic activity, Neuromuscular re-education, Balance training,  Gait training, Patient/Family education, Self Care, Joint mobilization, Joint manipulation, Stair training, Orthotic/Fit training, DME instructions, Aquatic Therapy, Dry Needling, Electrical stimulation, Wheelchair mobility training, Spinal manipulation, Spinal mobilization, Cryotherapy, Moist  heat, Compression bandaging, scar mobilization, Splintting, Taping, Vasopneumatic device, Ultrasound, Ionotophoresis 45m/ml Dexamethasone, Manual therapy, and Re-evaluation   PLAN FOR NEXT SESSION: focus on balance/balance additions to HEP, proximal strength. Likely DC in 3 sessions    Xavien Dauphinais U PT DPT PN2

## 2022-05-07 ENCOUNTER — Other Ambulatory Visit: Payer: Self-pay | Admitting: Cardiology

## 2022-05-14 DIAGNOSIS — Z23 Encounter for immunization: Secondary | ICD-10-CM | POA: Diagnosis not present

## 2022-05-19 ENCOUNTER — Ambulatory Visit (HOSPITAL_BASED_OUTPATIENT_CLINIC_OR_DEPARTMENT_OTHER): Payer: Medicare Other | Attending: Internal Medicine | Admitting: Physical Therapy

## 2022-05-19 ENCOUNTER — Encounter (HOSPITAL_BASED_OUTPATIENT_CLINIC_OR_DEPARTMENT_OTHER): Payer: Self-pay | Admitting: Physical Therapy

## 2022-05-19 DIAGNOSIS — R262 Difficulty in walking, not elsewhere classified: Secondary | ICD-10-CM | POA: Diagnosis not present

## 2022-05-19 DIAGNOSIS — M6281 Muscle weakness (generalized): Secondary | ICD-10-CM | POA: Diagnosis not present

## 2022-05-19 NOTE — Therapy (Signed)
OUTPATIENT PHYSICAL THERAPY LOWER EXTREMITY TREATMENT      Patient Name: [name] Steele MRN: [id number] DOB:25-Sep-1940, 82 y.o., male Today's Date: 05/19/2022   PT End of Session - 05/19/22 1104     Visit Number 16    Number of Visits 18    Date for PT Re-Evaluation 06/03/22    Authorization Type Medical Care/[employer]    PT Start Time 1101    PT Stop Time 1141    PT Time Calculation (min) 40 min    Activity Tolerance Patient tolerated treatment well    Behavior During Therapy WFL for tasks assessed/performed                      Past Medical History:  Diagnosis Date   Arthritis    Chronic kidney disease, stage 3a ([place])    Chronic urinary tract infection    Coronary artery calcification seen on CT scan    Diabetes mellitus without complication ([place])    Gout    Hypertension    ILD (interstitial lung disease) ([place])    by CT 01/2021   Mild aortic stenosis    Mild dilation of ascending aorta ([place])    Osteoporosis    Past Surgical History:  Procedure Laterality Date   CHOLECYSTECTOMY N/A 07/04/2018   Procedure: LAPAROSCOPIC CHOLECYSTECTOMY WITH INTRAOPERATIVE CHOLANGIOGRAM;  Surgeon: Coralie Keens, MD;  Location: [facility];  Service: General;  Laterality: N/A;   ERCP N/A 07/05/2018   Procedure: ENDOSCOPIC RETROGRADE CHOLANGIOPANCREATOGRAPHY (ERCP);  Surgeon: Clarene Essex, MD;  Location: [facility];  Service: Endoscopy;  Laterality: N/A;   JOINT REPLACEMENT Bilateral    REMOVAL OF STONES  07/05/2018   Procedure: REMOVAL OF STONES;  Surgeon: Clarene Essex, MD;  Location: [facility];  Service: Endoscopy;;   SPHINCTEROTOMY  07/05/2018   Procedure: Joan Mayans;  Surgeon: Clarene Essex, MD;  Location: [facility];  Service: Endoscopy;;   Patient Active Problem List   Diagnosis Date Noted   Near syncope 02/06/2022   E. coli UTI 02/06/2022   Infrarenal abdominal aortic aneurysm (AAA) without rupture ([place]) 02/06/2022   Hypokalemia 02/06/2022   Normocytic  anemia 02/06/2022   Coronary artery disease involving native coronary artery of native heart with angina pectoris ([place]) 04/14/2021   Heart murmur 04/14/2021   Nonspecific abnormal electrocardiogram (ECG) (EKG) 04/14/2021   Lower extremity edema 04/14/2021   Acute cholecystitis 07/04/2018   E coli bacteremia 04/11/2017   UTI (urinary tract infection) 04/10/2017   Sepsis ([place]) 04/10/2017   Type 2 diabetes mellitus without complication ([place]) 60/73/7106   Essential hypertension 04/10/2017    PCP: Deland Pretty, MD  REFERRING PROVIDER: Deland Pretty, MD  REFERRING DIAG: R53.81 (ICD-10-CM) - Other malaise  THERAPY DIAG:  Muscle weakness (generalized)  Difficulty walking  Rationale for Evaluation and Treatment Rehabilitation  ONSET DATE: 2023  SUBJECTIVE:   SUBJECTIVE STATEMENT: Goes by [name] Steele   I have some temporary hearing aides but the battery died, I can't win for losing. Had a good new years. No falls recently. I still really think that my feet are the problem with my balance because I don't have fat pads anymore from what my podiatrist said.   Eval: Pt presents with wife today to session. 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   Pt states his main concern is strength and mobility. Pt states he has been retired since 1973 and states his R knee began to slow down his activity. He finally got it replaced but learned the  habit of getting up and down with the assist of UE. He feels that he has a lot of trouble getting out of chairs without UE and feels that he could not get up off the floor without help. He feels he is slowly losing functional mobility. He feels that he is deconditioned at this time. He noticed he was not able to keep up with family during a family vacation that brought his attention to the issue. Pt has LE edema and wears compression hose. He is currently still grocery shopping and does a lot of the cooking at home. Pt drives a pickup truck currently but has a lot  of difficulty getting out his wife's low car.  Pt states he has to very conscous of walking and dragging his foot. 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   PERTINENT HISTORY: Bilat TKA, osteoarthritis, stage IIIa CKD, recurrent UTI,coronary artery calcification, type II DM, gout, interstitial lung disease, mild aortic stenosis and mild ascending dilatation, osteoporosis  PAIN:   Are you having pain? No Pain location: none    PRECAUTIONS: None and Other: HOH   WEIGHT BEARING RESTRICTIONS No  FALLS:  Has patient fallen in last 6 months? Yes. 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department  PLOF: [place] : improve functional mobility   OBJECTIVE:   DIAGNOSTIC FINDINGS: N/A orthopedics   TODAY'S TREATMENT:  05/19/22  NMR  Alternating toe taps on blue cone/from solid surface x20 SLS with intermittent toe touch from other leg 2x30 seconds B Cross midline reaches on blue foam pad at various angles x10 B small BOS Alternating marches blue foam pad x20 cues for "high knees"  Standing narrow BOS EC 3x30 seconds One foot forward + head turns solid surface 2x30 seconds B Backwards walking in treatment room x5 rounds cues for increased step length Forwards and lateral hurdle navigation  Forward step offs from blue foam pad x10 B Lateral step offs from blue foam pad x10 B  05/06/22  NMR   Tandem stance on foam 3x30 seconds B Narrow BOS on foam with head turns vertical x60 seconds, then head turns horizontal x60 seconds Standing EC blue foam pad 3x30 seconds  Forward step offs from blue foam pad x10 B, U UE support needed for stance on R LE due to unsteadiness  SLS with one foot on 6 inch box 3x30 second B  solid surface Standing marches on blue foam pad x60 seconds Lateral step offs blue foam pad x10 B    04/30/22   Objective Measures   MMT  Ankle DF: 5/5 B Knee extension 4+/5 B, knee flexion R 4/5 L 4+/5 Hip flexion 3-/5 B, hip ABD R 3/5, 4-/5 L   5xSTS 18.4 seconds use of U UE   6MWT 849f no device   Berg 44  NMR  SLS with one foot on 6 inch box 2x30 seconds B  Alternating marches on 6 inch box x30    04/27/22  TherEx  Nustep L5 x6 minutes BLEs only Supine staggered bridges (progressing to single leg) x10 B Sidelying hip ABD x10 B  STS with 10# KB x10 cues to not "plop" Farmers carry 10# KB  2 rounds Forward and lateral step ups 6 inch box x10 B  NMR  Standing on blue foam pad EC 3x30 seconds Standing on blue foam pad EO narrow BOS 3x30 seconds Marches on blue foam pad x20  Tandem stance on blue foam pad 3x30 seconds intermittent ModA    PATIENT EDUCATION:  Education details:   reassessment findings, POC moving forward, importance of reducing fall risk/improving balance Person educated: Patient Education method: Explanation, Demonstration, Tactile cues, Verbal cues, and Handouts Education comprehension: verbalized understanding, returned demonstration, verbal cues required, and tactile cues required     HOME EXERCISE PROGRAM:  Access Code: [email or web address] URL: [email or web address] Date: 05/06/2022 Prepared by: Deniece Ree  Exercises - Sit to Stand with Armchair  - 2 x daily - 7 x weekly - 2 sets - 5 reps - Supine Bridge  - 2 x daily - 7 x weekly - 2 sets - 10 reps - Standing Marching  - 2 x daily - 7 x weekly - 1 sets - 20 reps - Tandem Stance in Corner  - 2 x daily - 7 x weekly - 1 sets - 3 reps - 30 hold - Single Leg Balance  - 2 x daily - 7 x weekly - 1 sets - 3 reps - 15 hold    ASSESSMENT:   CLINICAL IMPRESSION:  [name] Steele arrives today doing OK, we continued working on balance but he sounds like he feels he may have maximized  benefit from PT at this time. Still needed up to [place] to keep his balance with challenging activities. We will plan on taking measures and potentially discharging to home program next session.   OBJECTIVE IMPAIRMENTS Abnormal gait, decreased activity tolerance, decreased balance, decreased endurance, decreased mobility, difficulty walking, decreased ROM, decreased strength, hypomobility, increased muscle spasms, impaired flexibility, improper body mechanics, and postural dysfunction.    ACTIVITY LIMITATIONS carrying, squatting, stairs, transfers, bed mobility, bathing, toileting, dressing, and locomotion level   PARTICIPATION LIMITATIONS: meal prep, cleaning, laundry, driving, shopping, community activity, and yard work   [place], Past/current experiences, Time since onset of injury/illness/exacerbation, and 3+ comorbidities:    are also affecting patient's functional outcome.    REHAB POTENTIAL: Fair   CLINICAL DECISION MAKING: Unstable/changing   EVALUATION COMPLEXITY: Moderate     GOALS: SHORT TERM GOALS: Target date: 04/27/2022      Pt will become independent with HEP in order to demonstrate synthesis of PT education.   Goal status: 12/14- MET "doing them every day"    2.  Pt will score at least 6 pt increase on FOTO to demonstrate functional improvement in MCII and pt perceived function.     Goal status: 12/14- DNT   3. Pt will be able to demonstrate/report ability to walk >15 mins without pain in order to demonstrate functional improvement and tolerance to exercise and community mobility.  Baseline:  Goal status: MET- 12/14 per pt report         LONG TERM GOALS: Target date: 06/08/2022       Pt  will become independent with final HEP in order to demonstrate synthesis of PT education.   Goal status: 12/14- ONGOING    2.   Pt will be able to walk at least an increase of >/= 165 ft during 6MWT in order to demonstrate improvement in functional endurance  and mobility for decrease in all cause mortality.    Goal status: 12/14- ONGOING   3.   Pt will be able to perform 5XSTS in under 12s  in order to demonstrate functional improvement above the cut  off score for adults. .  Goal status: 12/14- ONGOING    4.   Pt will score >/= 51 on FOTO to demonstrate improvement in perceived LE/balance function.  Goal status: 12/14- DNT   5.  Will score 48 on Berg to show reduced fall risk  Baseline:  Goal status: INITIAL 12/14- 44         PLAN: PT FREQUENCY: 1-2x/week   PT DURATION:  12 weeks (plan for D/C in 8-10)   PLANNED INTERVENTIONS: Therapeutic exercises, Therapeutic activity, Neuromuscular re-education, Balance training, Gait training, Patient/Family education, Self Care, Joint mobilization, Joint manipulation, Stair training, Orthotic/Fit training, DME instructions, Aquatic Therapy, Dry Needling, Electrical stimulation, Wheelchair mobility training, Spinal manipulation, Spinal mobilization, Cryotherapy, Moist heat, Compression bandaging, scar mobilization, Splintting, Taping, Vasopneumatic device, Ultrasound, Ionotophoresis 13m/ml Dexamethasone, Manual therapy, and Re-evaluation   PLAN FOR NEXT SESSION: re-assess and possible DC    Anai Lipson U PT DPT PN2

## 2022-05-25 ENCOUNTER — Other Ambulatory Visit: Payer: Self-pay | Admitting: Physician Assistant

## 2022-05-26 ENCOUNTER — Ambulatory Visit (HOSPITAL_BASED_OUTPATIENT_CLINIC_OR_DEPARTMENT_OTHER): Payer: Medicare Other | Admitting: Physical Therapy

## 2022-06-16 DIAGNOSIS — K4090 Unilateral inguinal hernia, without obstruction or gangrene, not specified as recurrent: Secondary | ICD-10-CM | POA: Diagnosis not present

## 2022-06-16 DIAGNOSIS — R1031 Right lower quadrant pain: Secondary | ICD-10-CM | POA: Diagnosis not present

## 2022-06-16 DIAGNOSIS — M549 Dorsalgia, unspecified: Secondary | ICD-10-CM | POA: Diagnosis not present

## 2022-06-16 DIAGNOSIS — R531 Weakness: Secondary | ICD-10-CM | POA: Diagnosis not present

## 2022-06-16 DIAGNOSIS — R3915 Urgency of urination: Secondary | ICD-10-CM | POA: Diagnosis not present

## 2022-06-16 DIAGNOSIS — I2510 Atherosclerotic heart disease of native coronary artery without angina pectoris: Secondary | ICD-10-CM | POA: Diagnosis not present

## 2022-06-16 DIAGNOSIS — E1140 Type 2 diabetes mellitus with diabetic neuropathy, unspecified: Secondary | ICD-10-CM | POA: Diagnosis not present

## 2022-06-16 DIAGNOSIS — R052 Subacute cough: Secondary | ICD-10-CM | POA: Diagnosis not present

## 2022-06-16 DIAGNOSIS — H02003 Unspecified entropion of right eye, unspecified eyelid: Secondary | ICD-10-CM | POA: Diagnosis not present

## 2022-06-16 DIAGNOSIS — R140 Abdominal distension (gaseous): Secondary | ICD-10-CM | POA: Diagnosis not present

## 2022-06-16 DIAGNOSIS — I10 Essential (primary) hypertension: Secondary | ICD-10-CM | POA: Diagnosis not present

## 2022-06-16 DIAGNOSIS — M1A09X1 Idiopathic chronic gout, multiple sites, with tophus (tophi): Secondary | ICD-10-CM | POA: Diagnosis not present

## 2022-06-23 DIAGNOSIS — H02051 Trichiasis without entropian right upper eyelid: Secondary | ICD-10-CM | POA: Diagnosis not present

## 2022-07-02 ENCOUNTER — Ambulatory Visit: Payer: Self-pay | Admitting: Surgery

## 2022-07-02 ENCOUNTER — Telehealth: Payer: Self-pay | Admitting: *Deleted

## 2022-07-02 DIAGNOSIS — K4090 Unilateral inguinal hernia, without obstruction or gangrene, not specified as recurrent: Secondary | ICD-10-CM | POA: Diagnosis not present

## 2022-07-02 NOTE — H&P (Signed)
[name] Steele [id number]   Referring Provider:  Alden Server, MD   Subjective   Chief Complaint: New Patient (Right inguinal hernia )     History of Present Illness:    82 year old male with history of arthritis, CKD 3, chronic UTI, diabetes, gout, hypertension, interstitial lung disease, osteoporosis, mild aortic stenosis and surgical history including laparoscopic cholecystectomy by Dr. Ninfa Linden with subsequent ERCP and sphincterotomy in 2020.  He is referred for evaluation of a possible right inguinal hernia.  He has been noticing this for few months, and notes occasional discomfort in the area.  No associated GI symptoms.  He had a CT scan done in September 2023 which I have reviewed the images and report.  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  CT chest 2 months later noting emphysema, enlarged pulmonic trunk indicative of pulmonary artery hypertension, aortic atherosclerosis and coronary artery calcification, unchanged left adrenal adenoma, and minimally progressive interstitial lung disease.  Review of Systems: A complete review of systems was obtained from the patient.  I have reviewed this information and discussed as appropriate with the patient.  See HPI as well for other ROS.   Medical History: Past Medical History:  Diagnosis Date   Diabetes mellitus without complication (CMS-HCC)    Hyperlipidemia    Hypertension     There is no problem list on file for this patient.   Past Surgical History:  Procedure Laterality Date   CHOLECYSTECTOMY       Allergies  Allergen  Reactions   Erythromycin Unknown    Severe Abdominal Pain   Penicillins Hives and Other (See Comments)    Has patient had a PCN reaction causing immediate rash, facial/tongue/throat swelling, SOB or lightheadedness with hypotension:  Yes   Has patient had a PCN reaction causing severe rash involving mucus membranes or skin necrosis:  No  Has patient had a PCN reaction that required hospitalization: No  Has patient had a PCN reaction occurring within the last 10 years: No  If all of the above answers are "NO", then may proceed with Cephalosporin use.  Tolerated CTX 11/23 admit    Current Outpatient Medications on File Prior to Visit  Medication Sig Dispense Refill   aspirin 81 MG EC tablet Take by mouth     carvediloL (COREG) 6.25 MG tablet Take 1 tablet by mouth 2 (two) times daily     FUROsemide (LASIX) 20 MG tablet Take 1 tablet by mouth once daily as needed     metFORMIN (GLUCOPHAGE-XR) 750 MG XR tablet      nitrofurantoin, macrocrystal-monohydrate, (MACROBID) 100 MG capsule TAKE 1 CAPSULE BY MOUTH EVERY 12 HOURS WITH FOOD FOR 7 DAYS     rosuvastatin (CRESTOR) 10 MG tablet Take 1 tablet by mouth once daily     No current facility-administered medications on file prior to visit.    Family History  Problem Relation Age of Onset   Stroke Brother    Coronary Artery Disease (Blocked arteries around heart) Brother      Social History   Tobacco Use  Smoking Status Former   Types: Cigarettes  Smokeless Tobacco Never  Social History   Socioeconomic History   Marital status: Married  Tobacco Use   Smoking status: Former    Types: Cigarettes   Smokeless tobacco: Never  Substance and Sexual Activity   Alcohol use: Yes   Drug use: Never    Objective:    Vitals:   07/02/22 1051  BP: 138/84  Pulse: 98  Temp: 36.7 C (98 F)  SpO2: 97%  Weight: 72.3 kg (159 lb 6.4 oz)  Height: 171.5 cm (5' 7.5")    Body mass index is 24.6 kg/m.  Gen: A&Ox3, no distress   Unlabored respiration Abdomen soft, nontender, nondistended.  There is a partially reducible, nontender right inguinal hernia.  No hernia on the left.  Assessment and Plan:  Diagnoses and all orders for this visit:  Non-recurrent unilateral inguinal hernia without obstruction or gangrene    We discussed the relevant anatomy and options for repair.  I recommend an open repair and we went over the surgical technique as well as risks of bleeding, infection, pain, scarring, injury to structures in the area including nerves, blood vessels, bowel, bladder, risk of chronic pain, hernia recurrence, risk of seroma or hematoma, urinary retention, and risks of general anesthesia including cardiovascular, pulmonary, and thromboembolic complications.  Discussed postoperative lifting/activity restrictions and expected timeline for recovery.  Questions were answered.  Patient wishes to proceed with scheduling.  Will request cardiac clearance given comorbidities.   Shera Laubach Raquel James, MD

## 2022-07-02 NOTE — Telephone Encounter (Signed)
   Pre-operative Risk Assessment    Patient Name: [name] Steele  DOB: 03/06/1941 MRN: [id number]      Request for Surgical Clearance    Procedure:   OPEN RIGHT INGUINAL HERNIA REPAIR  Date of Surgery:  Clearance TBD                                 Surgeon:  DR. Jens Som  Surgeon's Group or Practice Name:  [facility] Phone number:  [phone] Fax number:  [phone] ATTN: Karren Cobble, CMA   Type of Clearance Requested:   - Medical ; ASA   Type of Anesthesia:  General    Additional requests/questions:    Jiles Prows   07/02/2022, 5:34 PM

## 2022-07-03 NOTE — Telephone Encounter (Signed)
Preoperative team,   It appears patient was supposed to have nuclear stress test for abnormal EKG based on Dr. Marlou Porch last office note from 02/13/2022. He will need an office visit. He is scheduled with Dr. Marlou Porch on 07/31/2022. Not sure if he wants to wait that long so that may need to be moved up.   Thank you!  DW

## 2022-07-03 NOTE — Telephone Encounter (Signed)
Per pt's wife, DPR on file, they prefer to see Dr. Marlou Porch instead of an APP, so they will keep the 07/31/22 appointment, which at that time, clearance will be addressed.  Will route to the requested surgeon's office to make them aware.

## 2022-07-06 NOTE — Progress Notes (Addendum)
Cardiology Office Note:    Date:  07/14/2022   ID:  [name] Steele, DOB 1940-12-21, MRN [id number]  PCP:  [name] Steele, [place] [facility] Cardiologist:  [name] Furbish, MD     Referring MD: [name] Pretty, MD   Chief Complaint:  Pre-op Exam, Hypertension, and Leg Swelling     History of Present Illness:   [name] Steele is a 82 y.o. male with  CAD with elevated coronary calcium score over 6000 with extensive calcified plaque -NST recommended but never done.    Patient saw Dr. Marlou Steele 01/2022 after admission with vasovagal syncope in the setting of UTI.  Echo showed normal LVEF 60 to 65% with mild LVH no RWMA mild aortic valve stenosis.  Carotids 1 to 39% bilateral oral hydration encouraged.  Stress test ordered but never done.  ZIO monitor never completed.  History of vasovagal syncope 01/2022 ZIO monitor and stress test never completed.  Patient is now on my schedule for preop clearance for right inguinal hernia repair by Dr. Jens Steele [facility]. He walks 15-20 min up/down a hill daily. Has had increase leg swelling because he wasn't taking his lasix. Now taking it for the past 3 weeks. BP up today but was good at PCP. Very HOH. Wife helps him interpret. Doesn't drink much water or liquid during the day. Denies any further dizziness, syncope, chest pain, dyspnea, palpitations.            Past Medical History:  Diagnosis Date   Arthritis    Chronic kidney disease, stage 3a (HCC)    Chronic urinary tract infection    Coronary artery calcification seen on CT scan    Diabetes mellitus without complication (HCC)    Gout    Hypertension    ILD (interstitial lung disease) ([place])    by CT 01/2021   Mild aortic stenosis    Mild dilation of ascending aorta (HCC)    Osteoporosis    Current Medications: Current Meds  Medication Sig   aspirin EC 81 MG tablet Take 1 tablet (81 mg total) by mouth daily. Swallow whole.   carvedilol (COREG) 6.25 MG tablet TAKE 1  TABLET TWICE A DAY   colchicine 0.6 MG tablet Take 0.6 mg by mouth daily as needed (gout).    CRANBERRY PO Take 1 capsule by mouth daily.   furosemide (LASIX) 20 MG tablet TAKE 1 TABLET BY MOUTH EVERY DAY AS NEEDED   ipratropium (ATROVENT) 0.06 % nasal spray SMARTSIG:2 Spray(s) Both Nares 3 Times Daily PRN   metFORMIN (GLUCOPHAGE-XR) 750 MG 24 hr tablet Take 750 mg by mouth daily after breakfast.   rosuvastatin (CRESTOR) 10 MG tablet TAKE 1 TABLET DAILY   telmisartan (MICARDIS) 80 MG tablet Take 80 mg by mouth daily.    Allergies:   Erythromycin, Penicillin g, and Penicillins   Social History   Tobacco Use   Smoking status: Former    Packs/day: 1.00    Years: 31.00    Total pack years: 31.00    Types: Cigarettes    Quit date: 60    Years since quitting: 36.1   Smokeless tobacco: Never  Vaping Use   Vaping Use: Never used  Substance Use Topics   Alcohol use: Yes    Alcohol/week: 0.0 standard drinks of alcohol    Comment: social   Drug use: No    Family Hx: The patient's family history includes Heart disease in his sister.  ROS     Physical Exam:  VS:  BP (!) 158/78   Pulse (!) 55   Ht 5' 7.5" (1.715 m)   Wt 159 lb (72.1 kg)   SpO2 95%   BMI 24.54 kg/m     Wt Readings from Last 3 Encounters:  07/14/22 159 lb (72.1 kg)  03/27/22 160 lb (72.6 kg)  02/13/22 153 lb (69.4 kg)    Physical Exam  GEN: Thin, in no acute distress  Neck: no JVD, carotid bruits, or masses Cardiac:RRR; 1/6 systolic murmur LSB Respiratory:  clear to auscultation bilaterally, normal work of breathing GI: soft, nontender, nondistended, + BS Ext: plus 2 edema bilaterally, without cyanosis, clubbing,Good distal pulses bilaterally Neuro:  Alert and Oriented x 3, Psych: euthymic mood, full affect        EKGs/Labs/Other Test Reviewed:    EKG:  EKG is   ordered today.  The ekg ordered today demonstrates sinus bradycardia with first degree AV block  Recent Labs: 10/14/2021: TSH  1.350 02/06/2022: ALT 18 02/07/2022: BUN 9; Creatinine, Ser 0.70; Hemoglobin 10.6; Platelets 168; Potassium 3.8; Sodium 142   Recent Lipid Panel Recent Labs    07/15/21 1029  CHOL 93*  TRIG 70  HDL 46  LDLCALC 32     Prior CV Studies:   [place] Bilateral Carotid 02/07/2022:  Summary:  Right Carotid: Velocities in the right ICA are consistent with a 1-39%  stenosis.   Left Carotid: Velocities in the left ICA are consistent with a 1-39%  stenosis.   Vertebrals: Bilateral vertebral arteries demonstrate antegrade flow.    Echo 02/07/2022:    IMPRESSIONS    1. Left ventricular ejection fraction, by estimation, is 60 to 65%. The  left ventricle has normal function. The left ventricle has no regional  wall motion abnormalities. There is mild left ventricular hypertrophy.  Left ventricular diastolic parameters  were normal.   2. Right ventricular systolic function is normal. The right ventricular  size is normal. There is normal pulmonary artery systolic pressure.   3. The mitral valve is abnormal. No evidence of mitral valve  regurgitation. No evidence of mitral stenosis.   4. Gradient stable since TTE 10/14/21. The aortic valve is tricuspid.  There is moderate calcification of the aortic valve. There is moderate  thickening of the aortic valve. Aortic valve regurgitation is not  visualized. Mild aortic valve stenosis.   5. The inferior vena cava is normal in size with greater than 50%  respiratory variability, suggesting right atrial pressure of 3 mmHg.   Nuclear Stress Test 05/09/2021:      Findings are consistent with ischemia. The study is low risk.   No ST deviation was noted.   Left ventricular function is normal. Nuclear stress EF: 68 %. The left ventricular ejection fraction is hyperdynamic (>65%). End diastolic cavity size is normal. End systolic cavity size is normal.   Prior study not available for comparison.   Findings: Occasional PVCs in recovery. No evidence of  infarction. There is evidence of apical ischemia, small territory (< 10% of LV myocardium).    Conclusions: Stress test is positive. Low risk study based on small defect.   CT/Calcium Score 02/11/2021: COMPARISON:  CT a of the chest on 12/15/2017   FINDINGS: CORONARY CALCIUM SCORES:   Left Main: 0   LAD: 4,143   LCx: 0   RCA: 2,087   Total Agatston Score: 6,230   The calcium score is nearly impossible to accurately quantify due to contiguous nature of calcium with adjacent calcified aortic plaque as  well. Calcium detection software is unable to separate some of the calcifications and the LAD score also includes the left circumflex.   MESA database percentile: 97   AORTA MEASUREMENTS:   Ascending Aorta: 37 mm   Descending Aorta: 31 mm   OTHER FINDINGS:   The heart size is within normal limits. No pericardial fluid is identified. Visualized segments of the thoracic aorta and central pulmonary arteries are normal in caliber. Visualized mediastinum and hilar regions demonstrate no lymphadenopathy or masses. Progression subpleural interstitial thickening with potential early fibrotic disease at the lung bases. Visualized lungs show no evidence of pulmonary edema, consolidation, pneumothorax, nodule or pleural fluid. Visualized upper abdomen and bony structures are unremarkable.   IMPRESSION: 1. Extensive calcified plaque in the distribution of the coronary arteries. Exact quantification is not entirely accurate due to contiguous nature of plaque extending throughout the coronary artery tree which also included calculation of some adjacent plaque in the proximal thoracic aorta. Calculated score of 6,230 is at the 97th percentile for the patient's age, sex and race. 2. Evidence of chronic interstitial lung disease which shows progression since a prior CT in 2019 with evidence of possible early pulmonary fibrosis at the lung bases. Correlation suggested clinically  with any respiratory symptoms or prior pulmonary workup.   Risk Assessment/Calculations/Metrics:         HYPERTENSION CONTROL Vitals:   07/14/22 1340 07/14/22 1424  BP: (!) 160/102 (!) 158/78    The patient's blood pressure is elevated above target today.  In order to address the patient's elevated BP: Blood pressure will be monitored at home to determine if medication changes need to be made.       ASSESSMENT & PLAN:   No problem-specific Assessment & Plan notes found for this encounter.   Preop clearance for right inguinal hernia repair by Dr. Jens Steele [facility]. Patient never had stress test done in past with syncope and referred back to have it done prior to surgery. Will order. He's had no further symptoms of syncope or chest pain but has increased leg edema.   According to the Revised Cardiac Risk Index (RCRI), his Perioperative Risk of Major Cardiac Event is (%): 0.9  His Functional Capacity in METs is: 5.29 according to the [facility] Activity Status Index (DASI).  Addendum: NST ST depression and T wave inversion inflat leads with severe resting HTN but no ischemia. EF 50% with apical HK. Add amlodipine 5 mg for better BP controlled and acceptable risk for inguinal hernia repair.  History of vasovagal syncope in the setting of UTI and dehydration 01/2022 ZIO monitor never done. No further symptoms. Will not order.   CAD with elevated coronary calcium score over 6000 with extensive calcified plaque -NST recommended but never done-order today  Mild aortic stenosis on echo 01/2022 will monitor  HTN BP high today but good at PCP. Increase edema as well. Increase lasix for 2 days 2 gm sodium diet and wife will keep track at home  LE edema-hadn't been taking his lasix but started back 3-4 weeks ago. Still with some swelling increase to 40 mg daily for 2 days then back to 20 mg daily. Compression hose, increase water intake, elevation.  HLD LDL 32 06/2021       Shared  Decision Making/Informed Consent The risks [chest pain, shortness of breath, cardiac arrhythmias, dizziness, blood pressure fluctuations, myocardial infarction, stroke/transient ischemic attack, nausea, vomiting, allergic reaction, radiation exposure, metallic taste sensation and life-threatening complications (estimated to be 1 in  10,000)], benefits (risk stratification, diagnosing coronary artery disease, treatment guidance) and alternatives of a nuclear stress test were discussed in detail with [name] Steele and he agrees to proceed.   Dispo:  Return in about 4 months (around 11/12/2022) for Routine follow up in 4 months with Dr. Marlou Steele.   Medication Adjustments/Labs and Tests Ordered: Current medicines are reviewed at length with the patient today.  Concerns regarding medicines are outlined above.  Tests Ordered: Orders Placed This Encounter  Procedures   Cardiac Stress Test: Informed Consent Details: Physician/Practitioner Attestation; Transcribe to consent form and obtain patient signature   Myocardial Perfusion Imaging   EKG 12-Lead   Medication Changes: No orders of the defined types were placed in this encounter.  Sumner Boast, PA-C  07/14/2022 2:31 PM    [address], [place], [place]  [address] Phone: [phone]; Fax: [phone]

## 2022-07-14 ENCOUNTER — Ambulatory Visit: Payer: Medicare Other | Attending: Cardiology | Admitting: Physician Assistant

## 2022-07-14 ENCOUNTER — Encounter: Payer: Self-pay | Admitting: Physician Assistant

## 2022-07-14 VITALS — BP 158/78 | HR 55 | Ht 67.5 in | Wt 159.0 lb

## 2022-07-14 DIAGNOSIS — R600 Localized edema: Secondary | ICD-10-CM | POA: Diagnosis not present

## 2022-07-14 DIAGNOSIS — I10 Essential (primary) hypertension: Secondary | ICD-10-CM | POA: Diagnosis not present

## 2022-07-14 DIAGNOSIS — I25119 Atherosclerotic heart disease of native coronary artery with unspecified angina pectoris: Secondary | ICD-10-CM | POA: Diagnosis not present

## 2022-07-14 DIAGNOSIS — I350 Nonrheumatic aortic (valve) stenosis: Secondary | ICD-10-CM | POA: Insufficient documentation

## 2022-07-14 DIAGNOSIS — R55 Syncope and collapse: Secondary | ICD-10-CM | POA: Diagnosis not present

## 2022-07-14 DIAGNOSIS — Z01818 Encounter for other preprocedural examination: Secondary | ICD-10-CM | POA: Insufficient documentation

## 2022-07-14 DIAGNOSIS — E782 Mixed hyperlipidemia: Secondary | ICD-10-CM | POA: Diagnosis not present

## 2022-07-14 NOTE — Patient Instructions (Addendum)
Medication Instructions:   INCREASE Lasix two (2) tablets by mouth ( 40 mg) X 2 days than go back to one (1) tablet by mouth (20 mg) daily.   *If you need a refill on your cardiac medications before your next appointment, please call your pharmacy*   Lab Work:  None ordered.  If you have labs (blood work) drawn today and your tests are completely normal, you will receive your results only by: [place] (if you have MyChart) OR A paper copy in the mail If you have any lab test that is abnormal or we need to change your treatment, we will call you to review the results.   Testing/Procedures:  You are scheduled for a Myocardial Perfusion Imaging Study on Thursday, February 29 at 1:15pm.   Please arrive 15 minutes prior to your appointment time for registration and insurance purposes.   The test will take approximately 3 to 4 hours to complete; you may bring reading material. If someone comes with you to your appointment, they will need to remain in the main lobby due to limited space in the testing area.   How to prepare for your Myocardial Perfusion test:   Do not eat or drink 3 hours prior to your test, except you may have water.    Do not consume products containing caffeine (regular or decaffeinated) 12 hours prior to your test (ex: coffee, chocolate, soda, tea)   Do bring a list of your current medications with you. If not listed below, you may take your medications as normal.   Bring any held medication to your appointment, as you may be required to take it once the test is complete.   Do wear comfortable clothes (no dresses or overalls) and walking shoes. Tennis shoes are preferred. No heels or open toed shoes.  Do not wear cologne, aftershave or lotions (deodorant is allowed).   If these instructions are not followed, you test will have to be rescheduled.   Please report to [address] for your test. If you have questions or concerns about  your appointment, please call the Nuclear Lab at [phone].  If you cannot keep your appointment, please provide 24 hour notification to the Nuclear lab to avoid a possible $50 charge to your account.     Follow-Up: At [facility], you and your health needs are our priority.  As part of our continuing mission to provide you with exceptional heart care, we have created designated Provider Care Teams.  These Care Teams include your primary Cardiologist (physician) and Advanced Practice Providers (APPs -  Physician Assistants and Nurse Practitioners) who all work together to provide you with the care you need, when you need it.  We recommend signing up for the patient portal called "MyChart".  Sign up information is provided on this After Visit Summary.  MyChart is used to connect with patients for Virtual Visits (Telemedicine).  Patients are able to view lab/test results, encounter notes, upcoming appointments, etc.  Non-urgent messages can be sent to your provider as well.   To learn more about what you can do with MyChart, go to [email or web address].    Your next appointment:   4 month(s)  Provider:   Candee Furbish, MD     Other Instructions  Two Gram Sodium Diet 2000 mg  What is Sodium? Sodium is a mineral found naturally in many foods. The most significant source of sodium in the diet is table salt, which is about 40%  sodium.  Processed, convenience, and preserved foods also contain a large amount of sodium.  The body needs only 500 mg of sodium daily to function,  A normal diet provides more than enough sodium even if you do not use salt.  Why Limit Sodium? A build up of sodium in the body can cause thirst, increased blood pressure, shortness of breath, and water retention.  Decreasing sodium in the diet can reduce edema and risk of heart attack or stroke associated with high blood pressure.  Keep in mind that there are many other factors involved in these health problems.   Heredity, obesity, lack of exercise, cigarette smoking, stress and what you eat all play a role.  General Guidelines: Do not add salt at the table or in cooking.  One teaspoon of salt contains over 2 grams of sodium. Read food labels Avoid processed and convenience foods Ask your dietitian before eating any foods not dicussed in the menu planning guidelines Consult your physician if you wish to use a salt substitute or a sodium containing medication such as antacids.  Limit milk and milk products to 16 oz (2 cups) per day.  Shopping Hints: READ LABELS!! "Dietetic" does not necessarily mean low sodium. Salt and other sodium ingredients are often added to foods during processing.    Menu Planning Guidelines Food Group Choose More Often Avoid  Beverages (see also the milk group All fruit juices, low-sodium, salt-free vegetables juices, low-sodium carbonated beverages Regular vegetable or tomato juices, commercially softened water used for drinking or cooking  Breads and Cereals Enriched white, wheat, rye and pumpernickel bread, hard rolls and dinner rolls; muffins, cornbread and waffles; most dry cereals, cooked cereal without added salt; unsalted crackers and breadsticks; low sodium or homemade bread crumbs Bread, rolls and crackers with salted tops; quick breads; instant hot cereals; pancakes; commercial bread stuffing; self-rising flower and biscuit mixes; regular bread crumbs or cracker crumbs  Desserts and Sweets Desserts and sweets mad with mild should be within allowance Instant pudding mixes and cake mixes  Fats Butter or margarine; vegetable oils; unsalted salad dressings, regular salad dressings limited to 1 Tbs; light, sour and heavy cream Regular salad dressings containing bacon fat, bacon bits, and salt pork; snack dips made with instant soup mixes or processed cheese; salted nuts  Fruits Most fresh, frozen and canned fruits Fruits processed with salt or sodium-containing ingredient  (some dried fruits are processed with sodium sulfites        Vegetables Fresh, frozen vegetables and low- sodium canned vegetables Regular canned vegetables, sauerkraut, pickled vegetables, and others prepared in brine; frozen vegetables in sauces; vegetables seasoned with ham, bacon or salt pork  Condiments, Sauces, Miscellaneous  Salt substitute with physician's approval; pepper, herbs, spices; vinegar, lemon or lime juice; hot pepper sauce; garlic powder, onion powder, low sodium soy sauce (1 Tbs.); low sodium condiments (ketchup, chili sauce, mustard) in limited amounts (1 tsp.) fresh ground horseradish; unsalted tortilla chips, pretzels, potato chips, popcorn, salsa (1/4 cup) Any seasoning made with salt including garlic salt, celery salt, onion salt, and seasoned salt; sea salt, rock salt, kosher salt; meat tenderizers; monosodium glutamate; mustard, regular soy sauce, barbecue, sauce, chili sauce, teriyaki sauce, steak sauce, Worcestershire sauce, and most flavored vinegars; canned gravy and mixes; regular condiments; salted snack foods, olives, picles, relish, horseradish sauce, catsup   Food preparation: Try these seasonings Meats:    Pork Sage, onion Serve with applesauce  Chicken Poultry seasoning, thyme, parsley Serve with cranberry sauce  Lamb  Curry powder, rosemary, garlic, thyme Serve with mint sauce or jelly  Veal Marjoram, basil Serve with current jelly, cranberry sauce  Beef Pepper, bay leaf Serve with dry mustard, unsalted chive butter  Fish [place] leaf, dill Serve with unsalted lemon butter, unsalted parsley butter  Vegetables:    Asparagus Lemon juice   Broccoli Lemon juice   Carrots Mustard dressing parsley, mint, nutmeg, glazed with unsalted butter and sugar   Green beans Marjoram, lemon juice, nutmeg,dill seed   Tomatoes Basil, marjoram, onion   Spice /blend for [employer]" 4 tsp ground thyme 1 tsp ground sage 3 tsp ground rosemary 4 tsp ground marjoram   Test your  knowledge A product that says "Salt Free" may still contain sodium. True or False Garlic Powder and Hot Pepper Sauce an be used as alternative seasonings.True or False Processed foods have more sodium than fresh foods.  True or False Canned Vegetables have less sodium than froze True or False   WAYS TO DECREASE YOUR SODIUM INTAKE Avoid the use of added salt in cooking and at the table.  Table salt (and other prepared seasonings which contain salt) is probably one of the greatest sources of sodium in the diet.  Unsalted foods can gain flavor from the sweet, sour, and butter taste sensations of herbs and spices.  Instead of using salt for seasoning, try the following seasonings with the foods listed.  Remember: how you use them to enhance natural food flavors is limited only by your creativity... Allspice-Meat, fish, eggs, fruit, peas, red and yellow vegetables Almond Extract-Fruit baked goods Anise Seed-Sweet breads, fruit, carrots, beets, cottage cheese, cookies (tastes like licorice) Basil-Meat, fish, eggs, vegetables, rice, vegetables salads, soups, sauces Bay Leaf-Meat, fish, stews, poultry Burnet-Salad, vegetables (cucumber-like flavor) Caraway Seed-Bread, cookies, cottage cheese, meat, vegetables, cheese, rice Cardamon-Baked goods, fruit, soups Celery Powder or seed-Salads, salad dressings, sauces, meatloaf, soup, bread.Do not use  celery salt Chervil-Meats, salads, fish, eggs, vegetables, cottage cheese (parsley-like flavor) Chili Power-Meatloaf, chicken cheese, corn, eggplant, egg dishes Chives-Salads cottage cheese, egg dishes, soups, vegetables, sauces Cilantro-Salsa, casseroles Cinnamon-Baked goods, fruit, pork, lamb, chicken, carrots Cloves-Fruit, baked goods, fish, pot roast, green beans, beets, carrots Coriander-Pastry, cookies, meat, salads, cheese (lemon-orange flavor) Cumin-Meatloaf, fish,cheese, eggs, cabbage,fruit pie (caraway flavor) [employer], fruit, eggs, fish,  poultry, cottage cheese, vegetables Dill Seed-Meat, cottage cheese, poultry, vegetables, fish, salads, bread Fennel Seed-Bread, cookies, apples, pork, eggs, fish, beets, cabbage, cheese, Licorice-like flavor Garlic-(buds or powder) Salads, meat, poultry, fish, bread, butter, vegetables, potatoes.Do not  use garlic salt Ginger-Fruit, vegetables, baked goods, meat, fish, poultry Horseradish Root-Meet, vegetables, butter Lemon Juice or Extract-Vegetables, fruit, tea, baked goods, fish salads Mace-Baked goods fruit, vegetables, fish, poultry (taste like nutmeg) Maple Extract-Syrups Marjoram-Meat, chicken, fish, vegetables, breads, green salads (taste like Sage) Mint-Tea, lamb, sherbet, vegetables, desserts, carrots, cabbage Mustard, Dry or Seed-Cheese, eggs, meats, vegetables, poultry Nutmeg-Baked goods, fruit, chicken, eggs, vegetables, desserts Onion Powder-Meat, fish, poultry, vegetables, cheese, eggs, bread, rice salads (Do not use   Onion salt) Orange Extract-Desserts, baked goods Oregano-Pasta, eggs, cheese, onions, pork, lamb, fish, chicken, vegetables, green salads Paprika-Meat, fish, poultry, eggs, cheese, vegetables Parsley Flakes-Butter, vegetables, meat fish, poultry, eggs, bread, salads (certain forms may   Contain sodium Pepper-Meat fish, poultry, vegetables, eggs Peppermint Extract-Desserts, baked goods Poppy Seed-Eggs, bread, cheese, fruit dressings, baked goods, noodles, vegetables, cottage  [employer], poultry, meat, fish, cauliflower, turnips,eggs bread Saffron-Rice, bread, veal, chicken, fish, eggs Sage-Meat, fish, poultry, onions, eggplant, tomateos, pork, stews Savory-Eggs, salads, poultry,  meat, rice, vegetables, soups, pork Tarragon-Meat, poultry, fish, eggs, butter, vegetables (licorice-like flavor)  Thyme-Meat, poultry, fish, eggs, vegetables, (clover-like flavor), sauces, soups Tumeric-Salads, butter, eggs, fish, rice,  vegetables (saffron-like flavor) Vanilla Extract-Baked goods, candy Vinegar-Salads, vegetables, meat marinades Walnut Extract-baked goods, candy   2. Choose your Foods Wisely   The following is a list of foods to avoid which are high in sodium:  Meats-Avoid all smoked, canned, salt cured, dried and kosher meat and fish as well as Anchovies   [employer] meats:Bologna, Liverwurst, Pastrami Canned meat or fish  Marinated herring Caviar    Pepperoni Corned Beef   Pizza Dried chipped beef  Salami Frozen breaded fish or meat Salt pork Frankfurters or hot dogs  Sardines Gefilte fish   Sausage Ham (boiled ham, Proscuitto Smoked butt    spiced ham)   Spam      TV Dinners Vegetables Canned vegetables (Regular) Relish Canned mushrooms  Sauerkraut Olives    Tomato juice Pickles  Bakery and Dessert Products Canned puddings  Cream pies Cheesecake   Decorated cakes Cookies  Beverages/Juices Tomato juice, regular  Gatorade   V-8 vegetable juice, regular  Breads and Cereals Biscuit mixes   Salted potato chips, corn chips, pretzels Bread stuffing mixes  Salted crackers and rolls Pancake and waffle mixes Self-rising flour  Seasonings Accent    Meat sauces Barbecue sauce  Meat tenderizer Catsup    Monosodium glutamate (MSG) Celery salt   Onion salt Chili sauce   Prepared mustard Garlic salt   Salt, seasoned salt, sea salt Gravy mixes   Soy sauce Horseradish   Steak sauce Ketchup   Tartar sauce Lite salt    Teriyaki sauce Marinade mixes   Worcestershire sauce  Others Baking powder   Cocoa and cocoa mixes Baking soda   Commercial casserole mixes Candy-caramels, chocolate  Dehydrated soups    Bars, fudge,nougats  Instant rice and pasta mixes Canned broth or soup  Maraschino cherries Cheese, aged and processed cheese and cheese spreads  Learning Assessment Quiz  Indicated T (for True) or F (for False) for each of the following statements:  _____ Fresh fruits and  vegetables and unprocessed grains are generally low in sodium _____ Water may contain a considerable amount of sodium, depending on the source _____ You can always tell if a food is high in sodium by tasting it _____ Certain laxatives my be high in sodium and should be avoided unless prescribed   by a physician or pharmacist _____ Salt substitutes may be used freely by anyone on a sodium restricted diet _____ Sodium is present in table salt, food additives and as a natural component of   most foods _____ Table salt is approximately 90% sodium _____ Limiting sodium intake may help prevent excess fluid accumulation in the body _____ On a sodium-restricted diet, seasonings such as bouillon soy sauce, and    cooking wine should be used in place of table salt _____ On an ingredient list, a product which lists monosodium glutamate as the first   ingredient is an appropriate food to include on a low sodium diet  Circle the best answer(s) to the following statements (Hint: there may be more than one correct answer)  11. On a low-sodium diet, some acceptable snack items are:    A. Olives  F. Bean dip   K. Grapefruit juice    B. Salted Pretzels G. Commercial Popcorn   L. Canned peaches    C. Carrot Sticks  H. Bouillon   M. Unsalted nuts   D. [place] fries  I. Peanut butter crackers N. Salami   E. Sweet pickles J. Tomato Juice   O. Pizza  12.  Seasonings that may be used freely on a reduced - sodium diet include   A. Lemon wedges F.Monosodium glutamate K. Celery seed    B.Soysauce   G. Pepper   L. Mustard powder   C. Sea salt  H. Cooking wine  M. Onion flakes   D. Vinegar  E. Prepared horseradish N. Salsa   E. Sage   J. Worcestershire sauce  O. Chutney

## 2022-07-15 ENCOUNTER — Telehealth (HOSPITAL_COMMUNITY): Payer: Self-pay | Admitting: *Deleted

## 2022-07-15 NOTE — Telephone Encounter (Signed)
Per DPR spoke to spouse and gave instructions for MPI study scheduled on 07/16/22.

## 2022-07-16 ENCOUNTER — Ambulatory Visit (HOSPITAL_COMMUNITY): Payer: Medicare Other | Attending: Physician Assistant

## 2022-07-16 DIAGNOSIS — Z01818 Encounter for other preprocedural examination: Secondary | ICD-10-CM | POA: Diagnosis not present

## 2022-07-16 DIAGNOSIS — I25119 Atherosclerotic heart disease of native coronary artery with unspecified angina pectoris: Secondary | ICD-10-CM | POA: Diagnosis not present

## 2022-07-16 DIAGNOSIS — R55 Syncope and collapse: Secondary | ICD-10-CM | POA: Diagnosis not present

## 2022-07-16 DIAGNOSIS — E782 Mixed hyperlipidemia: Secondary | ICD-10-CM | POA: Insufficient documentation

## 2022-07-16 DIAGNOSIS — I350 Nonrheumatic aortic (valve) stenosis: Secondary | ICD-10-CM | POA: Diagnosis not present

## 2022-07-16 DIAGNOSIS — I10 Essential (primary) hypertension: Secondary | ICD-10-CM | POA: Diagnosis not present

## 2022-07-16 LAB — MYOCARDIAL PERFUSION IMAGING
LV dias vol: 96 mL (ref 62–150)
LV sys vol: 48 mL
Nuc Stress EF: 50 %
Peak HR: 103 {beats}/min
Rest HR: 56 {beats}/min
Rest Nuclear Isotope Dose: 10.8 mCi
SDS: 2
SRS: 0
SSS: 2
Stress Nuclear Isotope Dose: 32.4 mCi
TID: 1.06

## 2022-07-16 MED ORDER — TECHNETIUM TC 99M TETROFOSMIN IV KIT
32.4000 | PACK | Freq: Once | INTRAVENOUS | Status: AC | PRN
  Administered 2022-07-16: 32.4 via INTRAVENOUS

## 2022-07-16 MED ORDER — REGADENOSON 0.4 MG/5ML IV SOLN
0.4000 mg | Freq: Once | INTRAVENOUS | Status: AC
  Administered 2022-07-16: 0.4 mg via INTRAVENOUS

## 2022-07-16 MED ORDER — TECHNETIUM TC 99M TETROFOSMIN IV KIT
10.8000 | PACK | Freq: Once | INTRAVENOUS | Status: AC | PRN
  Administered 2022-07-16: 10.8 via INTRAVENOUS

## 2022-07-22 ENCOUNTER — Ambulatory Visit: Payer: Medicare Other | Admitting: Podiatry

## 2022-07-23 ENCOUNTER — Telehealth: Payer: Self-pay

## 2022-07-23 MED ORDER — AMLODIPINE BESYLATE 5 MG PO TABS: 5.0000 mg | ORAL_TABLET | Freq: Every day | ORAL | 3 refills | Status: DC

## 2022-07-23 NOTE — Telephone Encounter (Signed)
Spoke with pts wife (per DPR) . Pt made aware of results and is agreeable to plan

## 2022-07-23 NOTE — Telephone Encounter (Signed)
-----   Message from Imogene Burn, PA-C sent at 07/20/2022  9:56 AM EST ----- Stress test without ischemia but severe resting HTN. Add amlodipine 5 mg once daily. Have addendum my note and  sent his surgeon clearance for hernia repair. Thanks.

## 2022-07-28 NOTE — Progress Notes (Signed)
COVID Vaccine Completed:  Date of COVID positive in last 90 days:  PCP - Deland Pretty, MD Cardiologist - Candee Furbish, MD  Cardiac clearance by Ermalinda Barrios 07/14/22 in Epic   Chest x-ray - CT 04/07/22 Epic EKG - 07/14/22 Epic Stress Test - 07/16/22 Epic ECHO - 02/07/22 Epic Cardiac Cath -  Pacemaker/ICD device last checked: Spinal Cord Stimulator:  Bowel Prep -   Sleep Study -  CPAP -   Fasting Blood Sugar -  Checks Blood Sugar _____ times a day  Last dose of GLP1 agonist-  N/A GLP1 instructions:  N/A   Last dose of SGLT-2 inhibitors-  N/A SGLT-2 instructions: N/A   Blood Thinner Instructions: Aspirin Instructions: ASA 81 Last Dose:  Activity level:  Can go up a flight of stairs and perform activities of daily living without stopping and without symptoms of chest pain or shortness of breath.  Able to exercise without symptoms  Unable to go up a flight of stairs without symptoms of     Anesthesia review: Htn, CAD, AAA, DM2, heart murmur, CKD, ILD  Patient denies shortness of breath, fever, cough and chest pain at PAT appointment  Patient verbalized understanding of instructions that were given to them at the PAT appointment. Patient was also instructed that they will need to review over the PAT instructions again at home before surgery.

## 2022-07-28 NOTE — Patient Instructions (Signed)
SURGICAL WAITING ROOM VISITATION  Patients having surgery or a procedure may have no more than 2 support people in the waiting area - these visitors may rotate.    Children under the age of 60 must have an adult with them who is not the patient.  Due to an increase in RSV and influenza rates and associated hospitalizations, children ages 57 and under may not visit patients in [place].  If the patient needs to stay at the hospital during part of their recovery, the visitor guidelines for inpatient rooms apply. Pre-op nurse will coordinate an appropriate time for 1 support person to accompany patient in pre-op.  This support person may not rotate.    Please refer to the [facility] website for the visitor guidelines for Inpatients (after your surgery is over and you are in a regular room).    Your procedure is scheduled on: 07/31/22   Report to [facility] Main Entrance    Report to admitting at 9:55 AM   Call this number if you have problems the morning of surgery [phone]   Do not eat food :After Midnight.    After Midnight you may have the following liquids until 9:10 AM DAY OF SURGERY  Water Non-Citrus Juices (without pulp, NO RED-Apple, White grape, White cranberry) Black Coffee (NO MILK/CREAM OR CREAMERS, sugar ok)  Clear Tea (NO MILK/CREAM OR CREAMERS, sugar ok) regular and decaf                             Plain Jell-O (NO RED)                                           Fruit ices (not with fruit pulp, NO RED)                                     Popsicles (NO RED)                                                               Sports drinks like Gatorade (NO RED)          If you have questions, please contact your surgeon's office.   FOLLOW BOWEL PREP AND ANY ADDITIONAL PRE OP INSTRUCTIONS YOU RECEIVED FROM YOUR SURGEON'S OFFICE!!!     Oral Hygiene is also important to reduce your risk of infection.                                    Remember -  BRUSH YOUR TEETH THE MORNING OF SURGERY WITH YOUR REGULAR TOOTHPASTE  DENTURES WILL BE REMOVED PRIOR TO SURGERY PLEASE DO NOT APPLY "Poly grip" OR ADHESIVES!!!   Take these medicines the morning of surgery with A SIP OF WATER: Amlodipine, Carvedilol, Rosuvastatin, Inhaler  DO NOT TAKE ANY ORAL DIABETIC MEDICATIONS DAY OF YOUR SURGERY  How to Manage Your Diabetes Before and After Surgery  Why is it important to control my blood sugar before and  after surgery? Improving blood sugar levels before and after surgery helps healing and can limit problems. A way of improving blood sugar control is eating a healthy diet by:  Eating less sugar and carbohydrates  Increasing activity/exercise  Talking with your doctor about reaching your blood sugar goals High blood sugars (greater than 180 mg/dL) can raise your risk of infections and slow your recovery, so you will need to focus on controlling your diabetes during the weeks before surgery. Make sure that the doctor who takes care of your diabetes knows about your planned surgery including the date and location.  How do I manage my blood sugar before surgery? Check your blood sugar at least 4 times a day, starting 2 days before surgery, to make sure that the level is not too high or low. Check your blood sugar the morning of your surgery when you wake up and every 2 hours until you get to the Short Stay unit. If your blood sugar is less than 70 mg/dL, you will need to treat for low blood sugar: Do not take insulin. Treat a low blood sugar (less than 70 mg/dL) with  cup of clear juice (cranberry or apple), 4 glucose tablets, OR glucose gel. Recheck blood sugar in 15 minutes after treatment (to make sure it is greater than 70 mg/dL). If your blood sugar is not greater than 70 mg/dL on recheck, call [phone] for further instructions. Report your blood sugar to the short stay nurse when you get to Short Stay.  If you are admitted to the hospital  after surgery: Your blood sugar will be checked by the staff and you will probably be given insulin after surgery (instead of oral diabetes medicines) to make sure you have good blood sugar levels. The goal for blood sugar control after surgery is 80-180 mg/dL.   WHAT DO I DO ABOUT MY DIABETES MEDICATION?  Do not take oral diabetes medicines (pills) the morning of surgery.  THE DAY BEFORE SURGERY, take Metformin as prescribed.  THE MORNING OF SURGERY, do not take Metformin   Reviewed and Endorsed by [facility] Patient Education Committee, August 2015                              You may not have any metal on your body including jewelry, and body piercing             Do not wear lotions, powders, cologne, or deodorant  Do not shave  48 hours prior to surgery.               Men may shave face and neck.   Do not bring valuables to the hospital. [place].   Contacts, glasses, dentures or bridgework may not be worn into surgery.  DO NOT [place]. PHARMACY WILL DISPENSE MEDICATIONS LISTED ON YOUR MEDICATION LIST TO YOU DURING YOUR ADMISSION [place]!    Patients discharged on the day of surgery will not be allowed to drive home.  Someone NEEDS to stay with you for the first 24 hours after anesthesia.              Please read over the following fact sheets you were given: [facility] [phone] Schneiders    If you  received a COVID test during your pre-op visit  it is requested that you wear a mask when out in public, stay away from anyone that may not be feeling well and notify your surgeon if you develop symptoms. If you test positive for Covid or have been in contact with anyone that has tested positive in the last 10 days please notify you surgeon.    [place] - Preparing for Surgery Before surgery, you can play an important role.   Because skin is not sterile, your skin needs to be as free of germs as possible.  You can reduce the number of germs on your skin by washing with CHG (chlorahexidine gluconate) soap before surgery.  CHG is an antiseptic cleaner which kills germs and bonds with the skin to continue killing germs even after washing. Please DO NOT use if you have an allergy to CHG or antibacterial soaps.  If your skin becomes reddened/irritated stop using the CHG and inform your nurse when you arrive at Short Stay. Do not shave (including legs and underarms) for at least 48 hours prior to the first CHG shower.  You may shave your face/neck.  Please follow these instructions carefully:  1.  Shower with CHG Soap the night before surgery and the  morning of surgery.  2.  If you choose to wash your hair, wash your hair first as usual with your normal  shampoo.  3.  After you shampoo, rinse your hair and body thoroughly to remove the shampoo.                             4.  Use CHG as you would any other liquid soap.  You can apply chg directly to the skin and wash.  Gently with a scrungie or clean washcloth.  5.  Apply the CHG Soap to your body ONLY FROM THE NECK DOWN.   Do   not use on face/ open                           Wound or open sores. Avoid contact with eyes, ears mouth and   genitals (private parts).                       Wash face,  Genitals (private parts) with your normal soap.             6.  Wash thoroughly, paying special attention to the area where your    surgery  will be performed.  7.  Thoroughly rinse your body with warm water from the neck down.  8.  DO NOT shower/wash with your normal soap after using and rinsing off the CHG Soap.                9.  Pat yourself dry with a clean towel.            10.  Wear clean pajamas.            11.  Place clean sheets on your bed the night of your first shower and do not  sleep with pets. Day of Surgery : Do not apply any lotions/deodorants the morning of  surgery.  Please wear clean clothes to the hospital/surgery center.  FAILURE TO FOLLOW THESE INSTRUCTIONS MAY RESULT IN THE CANCELLATION OF YOUR SURGERY  PATIENT SIGNATURE_________________________________  NURSE SIGNATURE__________________________________  ________________________________________________________________________

## 2022-07-29 ENCOUNTER — Encounter (HOSPITAL_COMMUNITY)
Admission: RE | Admit: 2022-07-29 | Discharge: 2022-07-29 | Disposition: A | Discharge status: Home / Self Care | Payer: Medicare Other | Source: Ambulatory Visit | Attending: Surgery | Admitting: Surgery

## 2022-07-29 ENCOUNTER — Other Ambulatory Visit: Payer: Self-pay

## 2022-07-29 ENCOUNTER — Encounter (HOSPITAL_COMMUNITY): Payer: Self-pay

## 2022-07-29 VITALS — BP 159/98 | HR 72 | Temp 98.1°F | Resp 14 | Ht 70.0 in | Wt 148.4 lb

## 2022-07-29 DIAGNOSIS — K4090 Unilateral inguinal hernia, without obstruction or gangrene, not specified as recurrent: Secondary | ICD-10-CM | POA: Diagnosis not present

## 2022-07-29 DIAGNOSIS — Z87891 Personal history of nicotine dependence: Secondary | ICD-10-CM | POA: Insufficient documentation

## 2022-07-29 DIAGNOSIS — N1831 Chronic kidney disease, stage 3a: Secondary | ICD-10-CM | POA: Diagnosis not present

## 2022-07-29 DIAGNOSIS — I2510 Atherosclerotic heart disease of native coronary artery without angina pectoris: Secondary | ICD-10-CM | POA: Diagnosis not present

## 2022-07-29 DIAGNOSIS — Z01812 Encounter for preprocedural laboratory examination: Secondary | ICD-10-CM | POA: Insufficient documentation

## 2022-07-29 DIAGNOSIS — E1122 Type 2 diabetes mellitus with diabetic chronic kidney disease: Secondary | ICD-10-CM | POA: Insufficient documentation

## 2022-07-29 DIAGNOSIS — Z7984 Long term (current) use of oral hypoglycemic drugs: Secondary | ICD-10-CM | POA: Insufficient documentation

## 2022-07-29 DIAGNOSIS — I129 Hypertensive chronic kidney disease with stage 1 through stage 4 chronic kidney disease, or unspecified chronic kidney disease: Secondary | ICD-10-CM | POA: Diagnosis not present

## 2022-07-29 DIAGNOSIS — J849 Interstitial pulmonary disease, unspecified: Secondary | ICD-10-CM | POA: Insufficient documentation

## 2022-07-29 DIAGNOSIS — E119 Type 2 diabetes mellitus without complications: Secondary | ICD-10-CM

## 2022-07-29 HISTORY — DX: Cardiac murmur, unspecified: R01.1

## 2022-07-29 LAB — CBC
HCT: 39.6 % (ref 39.0–52.0)
Hemoglobin: 12.4 g/dL — ABNORMAL LOW (ref 13.0–17.0)
MCH: 31.2 pg (ref 26.0–34.0)
MCHC: 31.3 g/dL (ref 30.0–36.0)
MCV: 99.7 fL (ref 80.0–100.0)
Platelets: 178 10*3/uL (ref 150–400)
RBC: 3.97 MIL/uL — ABNORMAL LOW (ref 4.22–5.81)
RDW: 14 % (ref 11.5–15.5)
WBC: 4.9 10*3/uL (ref 4.0–10.5)
nRBC: 0 % (ref 0.0–0.2)

## 2022-07-29 LAB — BASIC METABOLIC PANEL
Anion gap: 9 (ref 5–15)
BUN: 13 mg/dL (ref 8–23)
CO2: 29 mmol/L (ref 22–32)
Calcium: 8.9 mg/dL (ref 8.9–10.3)
Chloride: 102 mmol/L (ref 98–111)
Creatinine, Ser: 0.98 mg/dL (ref 0.61–1.24)
GFR, Estimated: >60 mL/min (ref >60)
Glucose, Bld: 95 mg/dL (ref 70–99)
Potassium: 3.9 mmol/L (ref 3.5–5.1)
Sodium: 140 mmol/L (ref 135–145)

## 2022-07-29 LAB — GLUCOSE, CAPILLARY: Glucose-Capillary: 95 mg/dL (ref 70–99)

## 2022-07-30 LAB — HEMOGLOBIN A1C
Hgb A1c MFr Bld: 5.3 % (ref 4.8–5.6)
Mean Plasma Glucose: 105 mg/dL

## 2022-07-30 NOTE — Progress Notes (Signed)
Anesthesia Chart   Case: [id number] Date/Time: 07/31/22 1154   Procedure: OPEN RIGHT INGUINAL HERNIA REPAIR WITH MESH (Right)   Anesthesia type: General   Pre-op diagnosis: HERNIA   Location: [facility] ROOM 01 / [facility]   Surgeons: Clovis Riley, MD       DISCUSSION:82 y.o. former smoker with h/o HTN, DM II, CAD, mild aortic valve stenosis, ILD (follows by pulmonology, last seen 03/27/22), CKD stage III, hernia scheduled for above procedure 07/31/2022 with Dr. Romana Juniper.   Stress test 07/16/2022 low risk study.   Pt seen by cardiology for preoperative evaluation 07/14/2022. Per OV note, "Preop clearance for right inguinal hernia repair by Dr. Jens Som [facility]. Patient never had stress test done in past with syncope and referred back to have it done prior to surgery. Will order. He's had no further symptoms of syncope or chest pain but has increased leg edema.    According to the Revised Cardiac Risk Index (RCRI), his Perioperative Risk of Major Cardiac Event is (%): 0.9   His Functional Capacity in METs is: 5.29 according to the [facility] Activity Status Index (DASI).   Addendum: NST ST depression and T wave inversion inflat leads with severe resting HTN but no ischemia. EF 50% with apical HK. Add amlodipine 5 mg for better BP controlled and acceptable risk for inguinal hernia repair."  Anticipate pt can proceed with planned procedure barring acute status change.   VS: BP (!) 159/98   Pulse 72   Temp 36.7 C (Oral)   Resp 14   Ht '5\' 10"'$  (1.778 m)   Wt 67.3 kg   SpO2 97%   BMI 21.29 kg/m   PROVIDERS: Deland Pretty, MD is PCP   Cardiologist - Candee Furbish, MD  LABS: Labs reviewed: Acceptable for surgery. (all labs ordered are listed, but only abnormal results are displayed)  Labs Reviewed  CBC - Abnormal; Notable for the following components:      Result Value   RBC 3.97 (*)    Hemoglobin 12.4 (*)    All other components within normal limits  HEMOGLOBIN [id number]  BASIC  METABOLIC PANEL  GLUCOSE, CAPILLARY     IMAGES:   EKG:   CV: Myocardial Perfusion 07/16/2022   Findings are equivocal. The study is low risk.   Left ventricular function is abnormal. Global function is mildly reduced. There was a single regional abnormality at apex End diastolic cavity size is normal. End systolic cavity size is normal.   Prior study available for comparison from 05/09/2021.   ST depression and T wave inversion in inferior alteral leads with Lexiscan Severe resting HTN No ischemia on perfusion images with apical thinning Estimated EF 50% with apical hypokinesis   Echo 02/07/2022 1. Left ventricular ejection fraction, by estimation, is 60 to 65%. The  left ventricle has normal function. The left ventricle has no regional  wall motion abnormalities. There is mild left ventricular hypertrophy.  Left ventricular diastolic parameters  were normal.   2. Right ventricular systolic function is normal. The right ventricular  size is normal. There is normal pulmonary artery systolic pressure.   3. The mitral valve is abnormal. No evidence of mitral valve  regurgitation. No evidence of mitral stenosis.   4. Gradient stable since TTE 10/14/21. The aortic valve is tricuspid.  There is moderate calcification of the aortic valve. There is moderate  thickening of the aortic valve. Aortic valve regurgitation is not  visualized. Mild aortic valve stenosis.   5.  The inferior vena cava is normal in size with greater than 50%  respiratory variability, suggesting right atrial pressure of 3 mmHg.  Past Medical History:  Diagnosis Date   Arthritis    Chronic kidney disease, stage 3a (HCC)    Chronic urinary tract infection    Coronary artery calcification seen on CT scan    Diabetes mellitus without complication (HCC)    Gout    Heart murmur    Hypertension    ILD (interstitial lung disease) ([place])    by CT 01/2021   Mild aortic stenosis    Mild dilation of ascending aorta (HCC)     Osteoporosis     Past Surgical History:  Procedure Laterality Date   CHOLECYSTECTOMY N/A 07/04/2018   Procedure: LAPAROSCOPIC CHOLECYSTECTOMY WITH INTRAOPERATIVE CHOLANGIOGRAM;  Surgeon: Coralie Keens, MD;  Location: [facility];  Service: General;  Laterality: N/A;   ERCP N/A 07/05/2018   Procedure: ENDOSCOPIC RETROGRADE CHOLANGIOPANCREATOGRAPHY (ERCP);  Surgeon: Clarene Essex, MD;  Location: [facility];  Service: Endoscopy;  Laterality: N/A;   JOINT REPLACEMENT Bilateral    REMOVAL OF STONES  07/05/2018   Procedure: REMOVAL OF STONES;  Surgeon: Clarene Essex, MD;  Location: [facility];  Service: Endoscopy;;   SPHINCTEROTOMY  07/05/2018   Procedure: Joan Mayans;  Surgeon: Clarene Essex, MD;  [facility];  Service: Endoscopy;;    MEDICATIONS:  amLODipine (NORVASC) 5 MG tablet   aspirin EC 81 MG tablet   carvedilol (COREG) 6.25 MG tablet   colchicine 0.6 MG tablet   CRANBERRY PO   furosemide (LASIX) 20 MG tablet   ipratropium (ATROVENT) 0.06 % nasal spray   metFORMIN (GLUCOPHAGE-XR) 750 MG 24 hr tablet   rosuvastatin (CRESTOR) 10 MG tablet   telmisartan (MICARDIS) 80 MG tablet   No current facility-administered medications for this encounter.     [name] Felix Ward, PA-C [facility]Surgical [facility] [phone]

## 2022-07-30 NOTE — H&P (Signed)
[name] Steele [id number]   Referring Provider:  Pharr, Walter D, MD   Subjective   Chief Complaint: New Patient (Right inguinal hernia )     History of Present Illness:    81-year-old male with history of arthritis, CKD 3, chronic UTI, diabetes, gout, hypertension, interstitial lung disease, osteoporosis, mild aortic stenosis and surgical history including laparoscopic cholecystectomy by Dr. Blackman with subsequent ERCP and sphincterotomy in 2020.  He is referred for evaluation of a possible right inguinal hernia.  He has been noticing this for few months, and notes occasional discomfort in the area.  No associated GI symptoms.  He had a CT scan done in September 2023 which I have reviewed the images and report.  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  CT chest 2 months later noting emphysema, enlarged pulmonic trunk indicative of pulmonary artery hypertension, aortic atherosclerosis and coronary artery calcification, unchanged left adrenal adenoma, and minimally progressive interstitial lung disease.  Review of Systems: A complete review of systems was obtained from the patient.  I have reviewed this information and discussed as appropriate with the patient.  See HPI as well for other ROS.   Medical History: Past Medical History:  Diagnosis Date   Diabetes mellitus without complication (CMS-HCC)    Hyperlipidemia    Hypertension     There is no problem list on file for this patient.   Past Surgical History:  Procedure Laterality Date   CHOLECYSTECTOMY       Allergies  Allergen  Reactions   Erythromycin Unknown    Severe Abdominal Pain   Penicillins Hives and Other (See Comments)    Has patient had a PCN reaction causing immediate rash, facial/tongue/throat swelling, SOB or lightheadedness with hypotension:  Yes   Has patient had a PCN reaction causing severe rash involving mucus membranes or skin necrosis:  No  Has patient had a PCN reaction that required hospitalization: No  Has patient had a PCN reaction occurring within the last 10 years: No  If all of the above answers are "NO", then may proceed with Cephalosporin use.  Tolerated CTX 11/23 admit    Current Outpatient Medications on File Prior to Visit  Medication Sig Dispense Refill   aspirin 81 MG EC tablet Take by mouth     carvediloL (COREG) 6.25 MG tablet Take 1 tablet by mouth 2 (two) times daily     FUROsemide (LASIX) 20 MG tablet Take 1 tablet by mouth once daily as needed     metFORMIN (GLUCOPHAGE-XR) 750 MG XR tablet      nitrofurantoin, macrocrystal-monohydrate, (MACROBID) 100 MG capsule TAKE 1 CAPSULE BY MOUTH EVERY 12 HOURS WITH FOOD FOR 7 DAYS     rosuvastatin (CRESTOR) 10 MG tablet Take 1 tablet by mouth once daily     No current facility-administered medications on file prior to visit.    Family History  Problem Relation Age of Onset   Stroke Brother    Coronary Artery Disease (Blocked arteries around heart) Brother      Social History   Tobacco Use  Smoking Status Former   Types: Cigarettes  Smokeless Tobacco Never       Social History   Socioeconomic History   Marital status: Married  Tobacco Use   Smoking status: Former    Types: Cigarettes   Smokeless tobacco: Never  Substance and Sexual Activity   Alcohol use: Yes   Drug use: Never    Objective:    Vitals:   07/02/22 1051  BP: 138/84  Pulse: 98  Temp: 36.7 C (98 F)  SpO2: 97%  Weight: 72.3 kg (159 lb 6.4 oz)  Height: 171.5 cm (5' 7.5")    Body mass index is 24.6 kg/m.  Gen: A&Ox3, no distress   Unlabored respiration Abdomen soft, nontender, nondistended.  There is a partially reducible, nontender right inguinal hernia.  No hernia on the left.  Assessment and Plan:  Diagnoses and all orders for this visit:  Non-recurrent unilateral inguinal hernia without obstruction or gangrene    We discussed the relevant anatomy and options for repair.  I recommend an open repair and we went over the surgical technique as well as risks of bleeding, infection, pain, scarring, injury to structures in the area including nerves, blood vessels, bowel, bladder, risk of chronic pain, hernia recurrence, risk of seroma or hematoma, urinary retention, and risks of general anesthesia including cardiovascular, pulmonary, and thromboembolic complications.  Discussed postoperative lifting/activity restrictions and expected timeline for recovery.  Questions were answered.  Patient wishes to proceed with scheduling.  Will request cardiac clearance given comorbidities.   Heywood Tokunaga AMANDA Jareth Pardee, MD    

## 2022-07-30 NOTE — Anesthesia Preprocedure Evaluation (Addendum)
Anesthesia Evaluation  Patient identified by MRN, date of birth, ID band Patient awake    Reviewed: Allergy & Precautions, NPO status , Patient's Chart, lab work & pertinent test results  Airway Mallampati: I  TM Distance: >3 FB Neck ROM: Full    Dental  (+) Missing, Poor Dentition, Dental Advisory Given   Pulmonary former smoker    + decreased breath sounds      Cardiovascular hypertension, Pt. on medications and Pt. on home beta blockers + CAD  + Valvular Problems/Murmurs AS  Rhythm:Regular Rate:Normal  Echo: 1. Left ventricular ejection fraction, by estimation, is 60 to 65%. The  left ventricle has normal function. The left ventricle has no regional  wall motion abnormalities. There is mild left ventricular hypertrophy.  Left ventricular diastolic parameters  were normal.   2. Right ventricular systolic function is normal. The right ventricular  size is normal. There is normal pulmonary artery systolic pressure.   3. The mitral valve is abnormal. No evidence of mitral valve  regurgitation. No evidence of mitral stenosis.   4. Gradient stable since TTE 10/14/21. The aortic valve is tricuspid.  There is moderate calcification of the aortic valve. There is moderate  thickening of the aortic valve. Aortic valve regurgitation is not  visualized. Mild aortic valve stenosis.   5. The inferior vena cava is normal in size with greater than 50%  respiratory variability, suggesting right atrial pressure of 3 mmHg.     Neuro/Psych negative neurological ROS  negative psych ROS   GI/Hepatic negative GI ROS, Neg liver ROS,,,  Endo/Other  diabetes, Type 2, Oral Hypoglycemic Agents    Renal/GU Renal disease     Musculoskeletal  (+) Arthritis ,    Abdominal   Peds  Hematology   Anesthesia Other Findings   Reproductive/Obstetrics                             Anesthesia Physical Anesthesia Plan  ASA:  2  Anesthesia Plan: General   Post-op Pain Management: Tylenol PO (pre-op)*   Induction:   PONV Risk Score and Plan: 3 and Ondansetron, Dexamethasone and Treatment may vary due to age or medical condition  Airway Management Planned: Oral ETT and LMA  Additional Equipment: None  Intra-op Plan:   Post-operative Plan: Extubation in OR  Informed Consent: I have reviewed the patients History and Physical, chart, labs and discussed the procedure including the risks, benefits and alternatives for the proposed anesthesia with the patient or authorized representative who has indicated his/her understanding and acceptance.     Dental advisory given  Plan Discussed with: CRNA  Anesthesia Plan Comments: (See PAT note 07/29/2022)       Anesthesia Quick Evaluation

## 2022-07-31 ENCOUNTER — Ambulatory Visit (HOSPITAL_COMMUNITY)
Admission: RE | Admit: 2022-07-31 | Discharge: 2022-07-31 | Disposition: A | Discharge status: Home / Self Care | Payer: Medicare Other | Attending: Surgery | Admitting: Surgery

## 2022-07-31 ENCOUNTER — Encounter (HOSPITAL_COMMUNITY): Payer: Self-pay | Admitting: Surgery

## 2022-07-31 ENCOUNTER — Ambulatory Visit (HOSPITAL_COMMUNITY): Payer: Medicare Other | Admitting: Physician Assistant

## 2022-07-31 ENCOUNTER — Other Ambulatory Visit: Payer: Self-pay

## 2022-07-31 ENCOUNTER — Encounter (HOSPITAL_COMMUNITY)
Admission: RE | Disposition: A | Discharge status: Home / Self Care | Payer: Self-pay | Source: Home / Self Care | Attending: Surgery

## 2022-07-31 ENCOUNTER — Ambulatory Visit: Payer: Medicare Other | Admitting: Cardiology

## 2022-07-31 ENCOUNTER — Ambulatory Visit (HOSPITAL_BASED_OUTPATIENT_CLINIC_OR_DEPARTMENT_OTHER): Payer: Medicare Other

## 2022-07-31 DIAGNOSIS — Z87891 Personal history of nicotine dependence: Secondary | ICD-10-CM | POA: Diagnosis not present

## 2022-07-31 DIAGNOSIS — I7143 Infrarenal abdominal aortic aneurysm, without rupture: Secondary | ICD-10-CM | POA: Insufficient documentation

## 2022-07-31 DIAGNOSIS — Z7984 Long term (current) use of oral hypoglycemic drugs: Secondary | ICD-10-CM | POA: Diagnosis not present

## 2022-07-31 DIAGNOSIS — K4090 Unilateral inguinal hernia, without obstruction or gangrene, not specified as recurrent: Secondary | ICD-10-CM | POA: Diagnosis not present

## 2022-07-31 DIAGNOSIS — M109 Gout, unspecified: Secondary | ICD-10-CM | POA: Diagnosis not present

## 2022-07-31 DIAGNOSIS — M810 Age-related osteoporosis without current pathological fracture: Secondary | ICD-10-CM | POA: Insufficient documentation

## 2022-07-31 DIAGNOSIS — E1122 Type 2 diabetes mellitus with diabetic chronic kidney disease: Secondary | ICD-10-CM | POA: Insufficient documentation

## 2022-07-31 DIAGNOSIS — I129 Hypertensive chronic kidney disease with stage 1 through stage 4 chronic kidney disease, or unspecified chronic kidney disease: Secondary | ICD-10-CM | POA: Diagnosis not present

## 2022-07-31 DIAGNOSIS — I10 Essential (primary) hypertension: Secondary | ICD-10-CM | POA: Diagnosis not present

## 2022-07-31 DIAGNOSIS — I517 Cardiomegaly: Secondary | ICD-10-CM | POA: Insufficient documentation

## 2022-07-31 DIAGNOSIS — N183 Chronic kidney disease, stage 3 unspecified: Secondary | ICD-10-CM | POA: Insufficient documentation

## 2022-07-31 DIAGNOSIS — I350 Nonrheumatic aortic (valve) stenosis: Secondary | ICD-10-CM | POA: Diagnosis not present

## 2022-07-31 DIAGNOSIS — J849 Interstitial pulmonary disease, unspecified: Secondary | ICD-10-CM | POA: Insufficient documentation

## 2022-07-31 DIAGNOSIS — I2510 Atherosclerotic heart disease of native coronary artery without angina pectoris: Secondary | ICD-10-CM | POA: Diagnosis not present

## 2022-07-31 DIAGNOSIS — E785 Hyperlipidemia, unspecified: Secondary | ICD-10-CM | POA: Insufficient documentation

## 2022-07-31 DIAGNOSIS — M1990 Unspecified osteoarthritis, unspecified site: Secondary | ICD-10-CM | POA: Insufficient documentation

## 2022-07-31 DIAGNOSIS — Z794 Long term (current) use of insulin: Secondary | ICD-10-CM

## 2022-07-31 HISTORY — PX: INGUINAL HERNIA REPAIR: SHX194

## 2022-07-31 LAB — GLUCOSE, CAPILLARY
Glucose-Capillary: 96 mg/dL (ref 70–99)
Glucose-Capillary: 103 mg/dL — ABNORMAL HIGH (ref 70–99)

## 2022-07-31 SURGERY — REPAIR, HERNIA, INGUINAL, ADULT
Anesthesia: General | Site: Abdomen | Laterality: Right

## 2022-07-31 MED ORDER — EPHEDRINE 5 MG/ML INJ
INTRAVENOUS | Status: AC
  Filled 2022-07-31: qty 10

## 2022-07-31 MED ORDER — SODIUM CHLORIDE 0.9% FLUSH: 3.0000 mL | INTRAVENOUS | Status: DC | PRN

## 2022-07-31 MED ORDER — ONDANSETRON HCL 4 MG/2ML IJ SOLN
INTRAMUSCULAR | Status: DC | PRN
  Administered 2022-07-31: 4 mg via INTRAVENOUS

## 2022-07-31 MED ORDER — LIDOCAINE HCL (PF) 2 % IJ SOLN
INTRAMUSCULAR | Status: AC
  Filled 2022-07-31: qty 5

## 2022-07-31 MED ORDER — PHENYLEPHRINE HCL (PRESSORS) 10 MG/ML IV SOLN
INTRAVENOUS | Status: DC | PRN
  Administered 2022-07-31 (×6): 80 ug via INTRAVENOUS

## 2022-07-31 MED ORDER — ACETAMINOPHEN 500 MG PO TABS
1000.0000 mg | ORAL_TABLET | ORAL | Status: AC
  Administered 2022-07-31: 1000 mg via ORAL

## 2022-07-31 MED ORDER — FENTANYL CITRATE PF 50 MCG/ML IJ SOSY: 25.0000 ug | PREFILLED_SYRINGE | INTRAMUSCULAR | Status: DC | PRN

## 2022-07-31 MED ORDER — SUGAMMADEX SODIUM 500 MG/5ML IV SOLN
INTRAVENOUS | Status: DC | PRN
  Administered 2022-07-31: 190 mg via INTRAVENOUS

## 2022-07-31 MED ORDER — ACETAMINOPHEN 160 MG/5ML PO SOLN: 325.0000 mg | ORAL | Status: DC | PRN

## 2022-07-31 MED ORDER — DOCUSATE SODIUM 100 MG PO CAPS: 100.0000 mg | ORAL_CAPSULE | Freq: Two times a day (BID) | ORAL | 0 refills | Status: AC

## 2022-07-31 MED ORDER — ROCURONIUM BROMIDE 100 MG/10ML IV SOLN
INTRAVENOUS | Status: DC | PRN
  Administered 2022-07-31: 50 mg via INTRAVENOUS

## 2022-07-31 MED ORDER — OXYCODONE HCL 5 MG PO TABS: 5.0000 mg | ORAL_TABLET | Freq: Three times a day (TID) | ORAL | 0 refills | Status: AC | PRN

## 2022-07-31 MED ORDER — ACETAMINOPHEN 650 MG RE SUPP: 650.0000 mg | RECTAL | Status: DC | PRN

## 2022-07-31 MED ORDER — FENTANYL CITRATE (PF) 100 MCG/2ML IJ SOLN
INTRAMUSCULAR | Status: AC
  Filled 2022-07-31: qty 2

## 2022-07-31 MED ORDER — PROPOFOL 10 MG/ML IV BOLUS
INTRAVENOUS | Status: DC | PRN
  Administered 2022-07-31: 100 mg via INTRAVENOUS

## 2022-07-31 MED ORDER — EPHEDRINE SULFATE (PRESSORS) 50 MG/ML IJ SOLN
INTRAMUSCULAR | Status: DC | PRN
  Administered 2022-07-31: 10 mg via INTRAVENOUS

## 2022-07-31 MED ORDER — FENTANYL CITRATE (PF) 100 MCG/2ML IJ SOLN
INTRAMUSCULAR | Status: DC | PRN
  Administered 2022-07-31: 25 ug via INTRAVENOUS
  Administered 2022-07-31: 50 ug via INTRAVENOUS
  Administered 2022-07-31: 25 ug via INTRAVENOUS

## 2022-07-31 MED ORDER — DEXAMETHASONE SODIUM PHOSPHATE 10 MG/ML IJ SOLN
INTRAMUSCULAR | Status: AC
  Filled 2022-07-31: qty 1

## 2022-07-31 MED ORDER — PROPOFOL 10 MG/ML IV BOLUS
INTRAVENOUS | Status: AC
  Filled 2022-07-31: qty 20

## 2022-07-31 MED ORDER — AMISULPRIDE (ANTIEMETIC) 5 MG/2ML IV SOLN: 10.0000 mg | Freq: Once | INTRAVENOUS | Status: DC | PRN

## 2022-07-31 MED ORDER — CHLORHEXIDINE GLUCONATE 0.12 % MT SOLN
15.0000 mL | Freq: Once | OROMUCOSAL | Status: AC
  Administered 2022-07-31: 15 mL via OROMUCOSAL

## 2022-07-31 MED ORDER — ACETAMINOPHEN 500 MG PO TABS: 1000.0000 mg | ORAL_TABLET | Freq: Once | ORAL | Status: DC

## 2022-07-31 MED ORDER — PHENYLEPHRINE 80 MCG/ML (10ML) SYRINGE FOR IV PUSH (FOR BLOOD PRESSURE SUPPORT)
PREFILLED_SYRINGE | INTRAVENOUS | Status: AC
  Filled 2022-07-31: qty 20

## 2022-07-31 MED ORDER — BUPIVACAINE LIPOSOME 1.3 % IJ SUSP: 20.0000 mL | Freq: Once | INTRAMUSCULAR | Status: DC

## 2022-07-31 MED ORDER — ACETAMINOPHEN 325 MG PO TABS: 650.0000 mg | ORAL_TABLET | ORAL | Status: DC | PRN

## 2022-07-31 MED ORDER — OXYCODONE HCL 5 MG PO TABS: 5.0000 mg | ORAL_TABLET | ORAL | Status: DC | PRN

## 2022-07-31 MED ORDER — LACTATED RINGERS IV SOLN: INTRAVENOUS | Status: DC

## 2022-07-31 MED ORDER — ROCURONIUM BROMIDE 10 MG/ML (PF) SYRINGE
PREFILLED_SYRINGE | INTRAVENOUS | Status: AC
  Filled 2022-07-31: qty 20

## 2022-07-31 MED ORDER — ONDANSETRON HCL 4 MG/2ML IJ SOLN
INTRAMUSCULAR | Status: AC
  Filled 2022-07-31: qty 2

## 2022-07-31 MED ORDER — CHLORHEXIDINE GLUCONATE 4 % EX LIQD: 60.0000 mL | Freq: Once | CUTANEOUS | Status: DC

## 2022-07-31 MED ORDER — ORAL CARE MOUTH RINSE: 15.0000 mL | Freq: Once | OROMUCOSAL | Status: AC

## 2022-07-31 MED ORDER — ACETAMINOPHEN 325 MG PO TABS: 325.0000 mg | ORAL_TABLET | ORAL | Status: DC | PRN

## 2022-07-31 MED ORDER — OXYCODONE HCL 5 MG/5ML PO SOLN: 5.0000 mg | Freq: Once | ORAL | Status: DC | PRN

## 2022-07-31 MED ORDER — BUPIVACAINE LIPOSOME 1.3 % IJ SUSP
INTRAMUSCULAR | Status: AC
  Filled 2022-07-31: qty 20

## 2022-07-31 MED ORDER — LIDOCAINE HCL (CARDIAC) PF 100 MG/5ML IV SOSY
PREFILLED_SYRINGE | INTRAVENOUS | Status: DC | PRN
  Administered 2022-07-31: 40 mg via INTRAVENOUS

## 2022-07-31 MED ORDER — SODIUM CHLORIDE 0.9 % IV SOLN: 250.0000 mL | INTRAVENOUS | Status: DC | PRN

## 2022-07-31 MED ORDER — BUPIVACAINE-EPINEPHRINE 0.25% -1:200000 IJ SOLN
INTRAMUSCULAR | Status: DC | PRN
  Administered 2022-07-31: 30 mL

## 2022-07-31 MED ORDER — PROMETHAZINE HCL 25 MG/ML IJ SOLN: 6.2500 mg | INTRAMUSCULAR | Status: DC | PRN

## 2022-07-31 MED ORDER — ACETAMINOPHEN 500 MG PO TABS
ORAL_TABLET | ORAL | Status: AC
  Filled 2022-07-31: qty 2

## 2022-07-31 MED ORDER — SODIUM CHLORIDE 0.9% FLUSH: 3.0000 mL | Freq: Two times a day (BID) | INTRAVENOUS | Status: DC

## 2022-07-31 MED ORDER — CEFAZOLIN SODIUM-DEXTROSE 2-4 GM/100ML-% IV SOLN
2.0000 g | INTRAVENOUS | Status: AC
  Administered 2022-07-31: 2 g via INTRAVENOUS

## 2022-07-31 MED ORDER — OXYCODONE HCL 5 MG PO TABS: 5.0000 mg | ORAL_TABLET | Freq: Once | ORAL | Status: DC | PRN

## 2022-07-31 MED ORDER — DEXAMETHASONE SODIUM PHOSPHATE 10 MG/ML IJ SOLN
INTRAMUSCULAR | Status: DC | PRN
  Administered 2022-07-31: 4 mg via INTRAVENOUS

## 2022-07-31 MED ORDER — CEFAZOLIN SODIUM-DEXTROSE 2-4 GM/100ML-% IV SOLN
INTRAVENOUS | Status: AC
  Filled 2022-07-31: qty 100

## 2022-07-31 MED ORDER — BUPIVACAINE LIPOSOME 1.3 % IJ SUSP
INTRAMUSCULAR | Status: DC | PRN
  Administered 2022-07-31: 20 mL

## 2022-07-31 MED ORDER — ACETAMINOPHEN 10 MG/ML IV SOLN: 1000.0000 mg | Freq: Once | INTRAVENOUS | Status: DC | PRN

## 2022-07-31 MED ORDER — BUPIVACAINE-EPINEPHRINE (PF) 0.25% -1:200000 IJ SOLN
INTRAMUSCULAR | Status: AC
  Filled 2022-07-31: qty 30

## 2022-07-31 SURGICAL SUPPLY — 47 items
APL PRP STRL LF DISP 70% ISPRP (MISCELLANEOUS) ×1
APL SKNCLS STERI-STRIP NONHPOA (GAUZE/BANDAGES/DRESSINGS) ×1
BAG COUNTER SPONGE SURGICOUNT (BAG) IMPLANT
BAG SPNG CNTER NS LX DISP (BAG)
BENZOIN TINCTURE PRP APPL 2/3 (GAUZE/BANDAGES/DRESSINGS) ×1 IMPLANT
BLADE SURG 15 STRL LF DISP TIS (BLADE) ×1 IMPLANT
BLADE SURG 15 STRL SS (BLADE) ×1
CHLORAPREP W/TINT 26 (MISCELLANEOUS) ×1 IMPLANT
CLSR STERI-STRIP ANTIMIC 1/2X4 (GAUZE/BANDAGES/DRESSINGS) IMPLANT
COVER SURGICAL LIGHT HANDLE (MISCELLANEOUS) ×1 IMPLANT
DRAIN PENROSE 0.5X18 (DRAIN) ×1 IMPLANT
DRAPE LAPAROSCOPIC ABDOMINAL (DRAPES) ×1 IMPLANT
ELECT REM PT RETURN 15FT ADLT (MISCELLANEOUS) ×1 IMPLANT
GAUZE SPONGE 4X4 12PLY STRL (GAUZE/BANDAGES/DRESSINGS) IMPLANT
GLOVE BIO SURGEON STRL SZ 6 (GLOVE) ×1 IMPLANT
GLOVE INDICATOR 6.5 STRL GRN (GLOVE) ×1 IMPLANT
GLOVE SS BIOGEL STRL SZ 6 (GLOVE) ×1 IMPLANT
GOWN STRL REUS W/ TWL LRG LVL3 (GOWN DISPOSABLE) ×1 IMPLANT
GOWN STRL REUS W/ TWL XL LVL3 (GOWN DISPOSABLE) IMPLANT
GOWN STRL REUS W/TWL LRG LVL3 (GOWN DISPOSABLE) ×1
GOWN STRL REUS W/TWL XL LVL3 (GOWN DISPOSABLE)
KIT BASIN OR (CUSTOM PROCEDURE TRAY) ×1 IMPLANT
KIT TURNOVER KIT A (KITS) IMPLANT
MARKER SKIN DUAL TIP RULER LAB (MISCELLANEOUS) ×1 IMPLANT
MESH ULTRAPRO 3X6 7.6X15CM (Mesh General) IMPLANT
NDL HYPO 22X1.5 SAFETY MO (MISCELLANEOUS) ×1 IMPLANT
NEEDLE HYPO 22X1.5 SAFETY MO (MISCELLANEOUS) ×1 IMPLANT
NEEDLE SAFETY HYPO 22GAX1.5 (MISCELLANEOUS) ×1
PACK BASIC VI WITH GOWN DISP (CUSTOM PROCEDURE TRAY) ×1 IMPLANT
PENCIL SMOKE EVACUATOR (MISCELLANEOUS) IMPLANT
SPIKE FLUID TRANSFER (MISCELLANEOUS) ×1 IMPLANT
SPONGE T-LAP 4X18 ~~LOC~~+RFID (SPONGE) ×1 IMPLANT
STRIP CLOSURE SKIN 1/2X4 (GAUZE/BANDAGES/DRESSINGS) ×1 IMPLANT
SUT ETHIBOND 0 MO6 C/R (SUTURE) ×1 IMPLANT
SUT MNCRL AB 4-0 PS2 18 (SUTURE) ×1 IMPLANT
SUT PDS AB 0 CT1 36 (SUTURE) ×2 IMPLANT
SUT SILK 3 0 (SUTURE) ×1
SUT SILK 3-0 18XBRD TIE 12 (SUTURE) ×1 IMPLANT
SUT VIC AB 3-0 SH 27 (SUTURE) ×2
SUT VIC AB 3-0 SH 27XBRD (SUTURE) ×2 IMPLANT
SUT VICRYL 0 UR6 27IN ABS (SUTURE) IMPLANT
SUT VICRYL 3 0 BR 18  UND (SUTURE) ×1
SUT VICRYL 3 0 BR 18 UND (SUTURE) ×1 IMPLANT
SYR CONTROL 10ML LL (SYRINGE) ×1 IMPLANT
TOWEL OR 17X26 10 PK STRL BLUE (TOWEL DISPOSABLE) ×1 IMPLANT
TOWEL OR NON WOVEN STRL DISP B (DISPOSABLE) ×1 IMPLANT
TRAY FOLEY MTR SLVR 16FR STAT (SET/KITS/TRAYS/PACK) IMPLANT

## 2022-07-31 NOTE — Op Note (Signed)
Operative Note  [name] Steele  CK:[id number]  JK:[id number]  07/31/2022   Surgeon: Clovis Riley MD FACS   Procedure performed: Open right inguinal hernia repair with mesh   Preop diagnosis:  right inguinal hernia   Post-op diagnosis/intraop findings:  large indirect right inguinal hernia   Specimens: none   EBL: 5cc   Complications: none   Description of procedure: After confirming informed consent, the patient was taken to the operating room and placed supine on operating room table where general anesthesia was initiated, preoperative antibiotics were administered, SCDs applied, and a formal timeout was performed. The groin was clipped, prepped and draped in the usual sterile fashion. An oblique incision was made the just above the inguinal ligament after infiltrating the tissues with local anesthetic (exparel mixed with 0.25% marcaine with epinephrine). Soft tissues were dissected using electrocautery until the external oblique aponeurosis was encountered. This was divided sharply to expand the external ring. A plane was bluntly developed between the spermatic cord and the external oblique. The ilioinguinal nerve was divided between hemostats and each end ligated with 3-0 vicryl ties. The spermatic cord was then bluntly dissected away from the pubic tubercle and encircled with a Penrose. Inspection of the inguinal anatomy revealed a large indirect hernia sac. The indirect hernia sac was bluntly dissected away from the cord structures and skeletonized to the level of the internal ring, where it was reduced intact into the abdomen.  The inguinal floor was reenforced with interrupted 0 PDS, leaving an internal ring just sufficient for the cord structures. A 3 x 6 piece of ultra Pro mesh was brought onto the field and trimmed to approximate the field. This was sutured to the pubic tubercle fascia, inferior shelving edge and to the internal oblique superiorly with interrupted 0 ethibonds. The tails  of the mesh were wrapped around the spermatic cord, ensuring adequate room for the cord, and sutured to each other with 0 ethibond, and then directed laterally to lie flat beneath the external oblique aponeurosis. Hemostasis was ensured within the wound. The Penrose was removed. The external oblique aponeurosis was reapproximated with a running 3-0 Vicryl to re-create a narrowed external ring. More local was infiltrated around the pubic tubercle and in the plane just below the external oblique. The Scarpa's was reapproximated with interrupted 3-0 Vicryls. The skin was closed with a running subcuticular 4-0 Monocryl. The remainder of the local was injected in the subcutaneous and subcuticular space. The field was then cleaned, benzoin and Steri-Strips and sterile bandage were applied. Both testicles were palpated in the scrotum at the end of the case. The patient was then awakened extubated and taken to PACU in stable condition.    All counts were correct at the completion of the case

## 2022-07-31 NOTE — Transfer of Care (Signed)
Immediate Anesthesia Transfer of Care Note  Patient: [name] Steele  Procedure(s) Performed: OPEN RIGHT INGUINAL HERNIA REPAIR WITH MESH (Right: Abdomen)  Patient Location: PACU  Anesthesia Type:General  Level of Consciousness: awake, alert , and oriented  Airway & Oxygen Therapy: Patient Spontanous Breathing and Patient connected to face mask oxygen  Post-op Assessment: Report given to RN  Post vital signs: Reviewed and stable  Last Vitals:  Vitals Value Taken Time  BP 152/73 07/31/22 1349  Temp    Pulse 64 07/31/22 1353  Resp 15 07/31/22 1353  SpO2 100 % 07/31/22 1353  Vitals shown include unvalidated device data.  Last Pain:  Vitals:   07/31/22 1044  TempSrc:   PainSc: 0-No pain         Complications: No notable events documented.

## 2022-07-31 NOTE — Anesthesia Procedure Notes (Addendum)
Procedure Name: Intubation Date/Time: 07/31/2022 12:23 PM  Performed by: Jonna Munro, CRNAPre-anesthesia Checklist: Patient identified, Emergency Drugs available, Suction available, Patient being monitored and Timeout performed Patient Re-evaluated:Patient Re-evaluated prior to induction Oxygen Delivery Method: Circle system utilized Preoxygenation: Pre-oxygenation with 100% oxygen Induction Type: IV induction Ventilation: Mask ventilation without difficulty Laryngoscope Size: Mac and 3 Grade View: Grade I Tube type: Oral Tube size: 7.5 mm Number of attempts: 1 Airway Equipment and Method: Stylet Placement Confirmation: ETT inserted through vocal cords under direct vision, positive ETCO2, CO2 detector and breath sounds checked- equal and bilateral Secured at: 22 cm Tube secured with: Tape Dental Injury: Teeth and Oropharynx as per pre-operative assessment

## 2022-07-31 NOTE — Anesthesia Postprocedure Evaluation (Signed)
Anesthesia Post Note  Patient: Jemir Soergel  Procedure(s) Performed: OPEN RIGHT INGUINAL HERNIA REPAIR WITH MESH (Right: Abdomen)     Patient location during evaluation: PACU Anesthesia Type: General Level of consciousness: awake and alert Pain management: pain level controlled Vital Signs Assessment: post-procedure vital signs reviewed and stable Respiratory status: spontaneous breathing, nonlabored ventilation, respiratory function stable and patient connected to nasal cannula oxygen Cardiovascular status: blood pressure returned to baseline and stable Postop Assessment: no apparent nausea or vomiting Anesthetic complications: no  No notable events documented.  Last Vitals:  Vitals:   07/31/22 1415 07/31/22 1430  BP: 130/71   Pulse: (!) 59 61  Resp: 15 18  Temp:  (!) 36.1 C  SpO2: 91% 93%    Last Pain:  Vitals:   07/31/22 1430  TempSrc:   PainSc: 0-No pain                 Effie Berkshire

## 2022-07-31 NOTE — Discharge Instructions (Addendum)
HERNIA REPAIR: POST OP INSTRUCTIONS   EAT Gradually transition to a high fiber diet with a fiber supplement over the next few weeks after discharge.  Start with a pureed / full liquid diet (see below)  WALK Walk an hour a day (cumulative- not all at once).  Control your pain to do that.    CONTROL PAIN Control pain so that you can walk, sleep, tolerate sneezing/coughing, and go up/down stairs.  HAVE A BOWEL MOVEMENT DAILY Keep your bowels regular to avoid problems.  OK to try a laxative to override constipation.  OK to use an antidiarrheal to slow down diarrhea.  Call if not better after 2 tries  CALL IF YOU HAVE PROBLEMS/CONCERNS Call if you are still struggling despite following these instructions. Call if you have concerns not answered by these instructions  ######################################################################    DIET: Follow a light bland diet & liquids the first 24 hours after arrival home, such as soup, liquids, starches, etc.  Be sure to drink plenty of fluids.  Quickly advance to a usual solid diet within a few days.  Avoid fast food or heavy meals initially as you are more likely to get nauseated or have irregular bowels.   Take your usually prescribed home medications unless otherwise directed.  PAIN CONTROL: Pain is best controlled by a usual combination of three different methods TOGETHER: Ice/Heat Over the counter pain medication Prescription pain medication Most patients will experience some swelling and bruising around the hernia(s) such as the bellybutton, groins, or old incisions.  Ice packs or heating pads (30-60 minutes up to 6 times a day) will help. Use ice for the first few days to help decrease swelling and bruising, then switch to heat to help relax tight/sore spots and speed recovery.  Some people prefer to use ice alone, heat alone, alternating between ice & heat.  Experiment to what works for you.  Swelling and bruising can take several  weeks to resolve.   It is helpful to take an over-the-counter pain medication regularly for the first days: Naproxen (Aleve, etc)  Two '220mg'$  tabs twice a day OR Ibuprofen (Advil, etc) Three '200mg'$  tabs four times a day (every meal & bedtime) AND Acetaminophen (Tylenol, etc) 325-'650mg'$  four times a day (every meal & bedtime) A  prescription for pain medication should be given to you upon discharge.  Take your pain medication as prescribed, IF NEEDED.  If you are having problems/concerns with the prescription medicine (does not control pain, nausea, vomiting, rash, itching, etc), please call us [phone] to see if we need to switch you to a different pain medicine that will work better for you and/or control your side effect better. If you need a refill on your pain medication, please contact your pharmacy.  They will contact our office to request authorization. Prescriptions will not be filled after 5 pm or on week-ends.  Avoid getting constipated.  Between the surgery and the pain medications, it is common to experience some constipation.  Increasing fluid intake and taking a fiber supplement (such as Metamucil, Citrucel, FiberCon, MiraLax, etc) 1-2 times a day regularly will usually help prevent this problem from occurring.  A mild laxative (prune juice, Milk of Magnesia, MiraLax, etc) should be taken according to package directions if there are no bowel movements after 48 hours.    Wash / shower every day, starting 2 days after surgery.  You may shower over the steri strips or skin glue which are waterproof.  No rubbing, scrubbing,  lotions or ointments to incision(s). Do not soak or submerge.   Remove your outer bandage 2 days after surgery. Steri strips (if present) will peel off after 1-2 weeks. You may leave the incision open to air.  You may replace a dressing/Band-Aid to cover an incision for comfort if you wish.  Continue to shower over incision(s) after the dressing is off.  ACTIVITIES as  tolerated:   You may resume regular (light) daily activities beginning the next day--such as daily self-care, walking, climbing stairs--gradually increasing activities as tolerated.  Control your pain so that you can walk an hour a day.  If you can walk 30 minutes without difficulty, it is safe to try more intense activity such as jogging, treadmill, bicycling, low-impact aerobics, swimming, etc. Refrain from the most intensive and strenuous activity such as sit-ups, heavy lifting, contact sports, etc  Refrain from any heavy lifting or straining until 6 weeks after surgery.   DO NOT PUSH THROUGH PAIN.  Let pain be your guide: If it hurts to do something, don't do it.  Pain is your body warning you to avoid that activity for another week until the pain goes down. You may drive when you are no longer taking prescription pain medication, you can comfortably wear a seatbelt, and you can safely maneuver your car and apply brakes. You may have sexual intercourse when it is comfortable.   FOLLOW UP in our office Please call [facility] at [phone] to set up an appointment to see your surgeon in the office for a follow-up appointment approximately 2-3 weeks after your surgery. Make sure that you call for this appointment the day you arrive home to insure a convenient appointment time.  9.  If you have disability of FMLA / Family leave forms, please bring the forms to the office for processing.  (do not give to your surgeon).  WHEN TO CALL us [phone]: Poor pain control Reactions / problems with new medications (rash/itching, nausea, etc)  Fever over 101.5 F (38.5 C) Inability to urinate Nausea and/or vomiting Worsening swelling or bruising Continued bleeding from incision. Increased pain, redness, or drainage from the incision   The clinic staff is available to answer your questions during regular business hours (8:30am-5pm).  Please don't hesitate to call and ask to speak to one of our nurses for  clinical concerns.   If you have a medical emergency, go to the nearest emergency room or call 911.  A surgeon from [facility] is always on call at the hospitals in [facility], [place], [place], [place], [place]  [address] ?  [address] [address], [place], [place]   [address] MAIN: [phone] ? TOLL FREE: [phone] ? FAX: [phone] [email or web address]

## 2022-08-02 ENCOUNTER — Encounter (HOSPITAL_COMMUNITY): Payer: Self-pay | Admitting: Surgery

## 2022-08-13 ENCOUNTER — Encounter (HOSPITAL_COMMUNITY): Payer: PRIVATE HEALTH INSURANCE

## 2022-10-04 DIAGNOSIS — R0689 Other abnormalities of breathing: Secondary | ICD-10-CM | POA: Diagnosis not present

## 2022-10-04 DIAGNOSIS — R0902 Hypoxemia: Secondary | ICD-10-CM | POA: Diagnosis not present

## 2022-10-04 DIAGNOSIS — R062 Wheezing: Secondary | ICD-10-CM | POA: Diagnosis not present

## 2022-10-04 DIAGNOSIS — F32A Depression, unspecified: Secondary | ICD-10-CM | POA: Diagnosis not present

## 2022-10-04 DIAGNOSIS — R0789 Other chest pain: Secondary | ICD-10-CM | POA: Diagnosis not present

## 2022-10-05 ENCOUNTER — Inpatient Hospital Stay (HOSPITAL_COMMUNITY): Payer: Medicare Other

## 2022-10-05 ENCOUNTER — Encounter (HOSPITAL_COMMUNITY): Payer: Self-pay | Admitting: Emergency Medicine

## 2022-10-05 ENCOUNTER — Other Ambulatory Visit: Payer: Self-pay

## 2022-10-05 ENCOUNTER — Inpatient Hospital Stay (HOSPITAL_COMMUNITY)
Admission: EM | Admit: 2022-10-05 | Discharge: 2022-10-10 | DRG: 270 | Disposition: A | Discharge status: Home Health | Payer: Medicare Other | Attending: Cardiovascular Disease | Admitting: Cardiovascular Disease

## 2022-10-05 ENCOUNTER — Encounter (HOSPITAL_COMMUNITY)
Admission: EM | Disposition: A | Discharge status: Home Health | Payer: Self-pay | Source: Home / Self Care | Attending: Cardiovascular Disease

## 2022-10-05 ENCOUNTER — Emergency Department (HOSPITAL_COMMUNITY): Payer: Medicare Other

## 2022-10-05 DIAGNOSIS — Z515 Encounter for palliative care: Secondary | ICD-10-CM

## 2022-10-05 DIAGNOSIS — Z87891 Personal history of nicotine dependence: Secondary | ICD-10-CM

## 2022-10-05 DIAGNOSIS — I214 Non-ST elevation (NSTEMI) myocardial infarction: Secondary | ICD-10-CM | POA: Diagnosis not present

## 2022-10-05 DIAGNOSIS — Z880 Allergy status to penicillin: Secondary | ICD-10-CM

## 2022-10-05 DIAGNOSIS — N390 Urinary tract infection, site not specified: Secondary | ICD-10-CM | POA: Diagnosis present

## 2022-10-05 DIAGNOSIS — I509 Heart failure, unspecified: Secondary | ICD-10-CM | POA: Diagnosis not present

## 2022-10-05 DIAGNOSIS — Z66 Do not resuscitate: Secondary | ICD-10-CM | POA: Diagnosis not present

## 2022-10-05 DIAGNOSIS — I77810 Thoracic aortic ectasia: Secondary | ICD-10-CM | POA: Diagnosis not present

## 2022-10-05 DIAGNOSIS — M810 Age-related osteoporosis without current pathological fracture: Secondary | ICD-10-CM | POA: Diagnosis present

## 2022-10-05 DIAGNOSIS — I213 ST elevation (STEMI) myocardial infarction of unspecified site: Secondary | ICD-10-CM

## 2022-10-05 DIAGNOSIS — Z881 Allergy status to other antibiotic agents status: Secondary | ICD-10-CM | POA: Diagnosis not present

## 2022-10-05 DIAGNOSIS — I25110 Atherosclerotic heart disease of native coronary artery with unstable angina pectoris: Secondary | ICD-10-CM | POA: Diagnosis not present

## 2022-10-05 DIAGNOSIS — I252 Old myocardial infarction: Secondary | ICD-10-CM

## 2022-10-05 DIAGNOSIS — E1122 Type 2 diabetes mellitus with diabetic chronic kidney disease: Secondary | ICD-10-CM | POA: Diagnosis present

## 2022-10-05 DIAGNOSIS — I723 Aneurysm of iliac artery: Secondary | ICD-10-CM | POA: Diagnosis not present

## 2022-10-05 DIAGNOSIS — Z7984 Long term (current) use of oral hypoglycemic drugs: Secondary | ICD-10-CM

## 2022-10-05 DIAGNOSIS — R4182 Altered mental status, unspecified: Secondary | ICD-10-CM | POA: Diagnosis not present

## 2022-10-05 DIAGNOSIS — I5021 Acute systolic (congestive) heart failure: Secondary | ICD-10-CM | POA: Diagnosis present

## 2022-10-05 DIAGNOSIS — I959 Hypotension, unspecified: Secondary | ICD-10-CM | POA: Diagnosis not present

## 2022-10-05 DIAGNOSIS — Z7982 Long term (current) use of aspirin: Secondary | ICD-10-CM

## 2022-10-05 DIAGNOSIS — J849 Interstitial pulmonary disease, unspecified: Secondary | ICD-10-CM | POA: Diagnosis not present

## 2022-10-05 DIAGNOSIS — B9620 Unspecified Escherichia coli [E. coli] as the cause of diseases classified elsewhere: Secondary | ICD-10-CM | POA: Diagnosis present

## 2022-10-05 DIAGNOSIS — I5043 Acute on chronic combined systolic (congestive) and diastolic (congestive) heart failure: Secondary | ICD-10-CM

## 2022-10-05 DIAGNOSIS — I2510 Atherosclerotic heart disease of native coronary artery without angina pectoris: Secondary | ICD-10-CM

## 2022-10-05 DIAGNOSIS — J90 Pleural effusion, not elsewhere classified: Secondary | ICD-10-CM | POA: Diagnosis not present

## 2022-10-05 DIAGNOSIS — Z9049 Acquired absence of other specified parts of digestive tract: Secondary | ICD-10-CM | POA: Diagnosis not present

## 2022-10-05 DIAGNOSIS — R570 Cardiogenic shock: Secondary | ICD-10-CM | POA: Diagnosis present

## 2022-10-05 DIAGNOSIS — N183 Chronic kidney disease, stage 3 unspecified: Secondary | ICD-10-CM | POA: Diagnosis not present

## 2022-10-05 DIAGNOSIS — R9431 Abnormal electrocardiogram [ECG] [EKG]: Secondary | ICD-10-CM | POA: Diagnosis not present

## 2022-10-05 DIAGNOSIS — Z79899 Other long term (current) drug therapy: Secondary | ICD-10-CM | POA: Diagnosis not present

## 2022-10-05 DIAGNOSIS — I130 Hypertensive heart and chronic kidney disease with heart failure and stage 1 through stage 4 chronic kidney disease, or unspecified chronic kidney disease: Secondary | ICD-10-CM | POA: Diagnosis present

## 2022-10-05 DIAGNOSIS — N1831 Chronic kidney disease, stage 3a: Secondary | ICD-10-CM | POA: Diagnosis present

## 2022-10-05 DIAGNOSIS — I255 Ischemic cardiomyopathy: Secondary | ICD-10-CM | POA: Diagnosis not present

## 2022-10-05 DIAGNOSIS — E119 Type 2 diabetes mellitus without complications: Secondary | ICD-10-CM

## 2022-10-05 DIAGNOSIS — H9190 Unspecified hearing loss, unspecified ear: Secondary | ICD-10-CM | POA: Diagnosis present

## 2022-10-05 DIAGNOSIS — I519 Heart disease, unspecified: Secondary | ICD-10-CM | POA: Diagnosis not present

## 2022-10-05 DIAGNOSIS — E118 Type 2 diabetes mellitus with unspecified complications: Secondary | ICD-10-CM

## 2022-10-05 DIAGNOSIS — I493 Ventricular premature depolarization: Secondary | ICD-10-CM | POA: Diagnosis present

## 2022-10-05 DIAGNOSIS — I350 Nonrheumatic aortic (valve) stenosis: Secondary | ICD-10-CM | POA: Diagnosis not present

## 2022-10-05 DIAGNOSIS — Z7189 Other specified counseling: Secondary | ICD-10-CM | POA: Diagnosis not present

## 2022-10-05 DIAGNOSIS — Z8249 Family history of ischemic heart disease and other diseases of the circulatory system: Secondary | ICD-10-CM

## 2022-10-05 DIAGNOSIS — I2101 ST elevation (STEMI) myocardial infarction involving left main coronary artery: Secondary | ICD-10-CM | POA: Diagnosis present

## 2022-10-05 DIAGNOSIS — I440 Atrioventricular block, first degree: Secondary | ICD-10-CM | POA: Diagnosis present

## 2022-10-05 DIAGNOSIS — I110 Hypertensive heart disease with heart failure: Secondary | ICD-10-CM | POA: Diagnosis not present

## 2022-10-05 HISTORY — PX: RIGHT/LEFT HEART CATH AND CORONARY ANGIOGRAPHY: CATH118266

## 2022-10-05 HISTORY — PX: IABP INSERTION: CATH118242

## 2022-10-05 HISTORY — PX: AORTIC ARCH ANGIOGRAPHY: CATH118224

## 2022-10-05 HISTORY — DX: Old myocardial infarction: I25.2

## 2022-10-05 HISTORY — DX: Atherosclerotic heart disease of native coronary artery without angina pectoris: I25.10

## 2022-10-05 LAB — GLUCOSE, CAPILLARY
Glucose-Capillary: 78 mg/dL (ref 70–99)
Glucose-Capillary: 86 mg/dL (ref 70–99)
Glucose-Capillary: 108 mg/dL — ABNORMAL HIGH (ref 70–99)
Glucose-Capillary: 69 mg/dL — ABNORMAL LOW (ref 70–99)
Glucose-Capillary: 79 mg/dL (ref 70–99)

## 2022-10-05 LAB — CBC
HCT: 36.3 % — ABNORMAL LOW (ref 39.0–52.0)
Hemoglobin: 11.3 g/dL — ABNORMAL LOW (ref 13.0–17.0)
MCH: 31.7 pg (ref 26.0–34.0)
MCHC: 31.1 g/dL (ref 30.0–36.0)
MCV: 102 fL — ABNORMAL HIGH (ref 80.0–100.0)
Platelets: 208 10*3/uL (ref 150–400)
RBC: 3.56 MIL/uL — ABNORMAL LOW (ref 4.22–5.81)
RDW: 14.6 % (ref 11.5–15.5)
WBC: 6.8 10*3/uL (ref 4.0–10.5)
nRBC: 0 % (ref 0.0–0.2)

## 2022-10-05 LAB — BRAIN NATRIURETIC PEPTIDE: B Natriuretic Peptide: 1750 pg/mL — ABNORMAL HIGH (ref 0.0–100.0)

## 2022-10-05 LAB — LACTIC ACID, PLASMA
Lactic Acid, Venous: 2.3 mmol/L (ref 0.5–1.9)
Lactic Acid, Venous: 0.4 mmol/L — ABNORMAL LOW (ref 0.5–1.9)

## 2022-10-05 LAB — CBG MONITORING, ED: Glucose-Capillary: 136 mg/dL — ABNORMAL HIGH (ref 70–99)

## 2022-10-05 LAB — BASIC METABOLIC PANEL
Anion gap: 10 (ref 5–15)
BUN: 16 mg/dL (ref 8–23)
CO2: 25 mmol/L (ref 22–32)
Calcium: 8.6 mg/dL — ABNORMAL LOW (ref 8.9–10.3)
Chloride: 103 mmol/L (ref 98–111)
Creatinine, Ser: 1.21 mg/dL (ref 0.61–1.24)
GFR, Estimated: 60 mL/min — ABNORMAL LOW (ref >60)
Glucose, Bld: 150 mg/dL — ABNORMAL HIGH (ref 70–99)
Potassium: 3.9 mmol/L (ref 3.5–5.1)
Sodium: 138 mmol/L (ref 135–145)

## 2022-10-05 LAB — POCT I-STAT 7, (LYTES, BLD GAS, ICA,H+H)
Acid-base deficit: 2 mmol/L (ref 0.0–2.0)
Bicarbonate: 22.2 mmol/L (ref 20.0–28.0)
Calcium, Ion: 1.21 mmol/L (ref 1.15–1.40)
HCT: 32 % — ABNORMAL LOW (ref 39.0–52.0)
Hemoglobin: 10.9 g/dL — ABNORMAL LOW (ref 13.0–17.0)
O2 Saturation: 91 %
Potassium: 4 mmol/L (ref 3.5–5.1)
Sodium: 140 mmol/L (ref 135–145)
TCO2: 23 mmol/L (ref 22–32)
pCO2 arterial: 33.4 mmHg (ref 32–48)
pH, Arterial: 7.431 (ref 7.35–7.45)
pO2, Arterial: 58 mmHg — ABNORMAL LOW (ref 83–108)

## 2022-10-05 LAB — HEPARIN LEVEL (UNFRACTIONATED)
Heparin Unfractionated: <0.1 IU/mL — ABNORMAL LOW (ref 0.30–0.70)
Heparin Unfractionated: 0.43 IU/mL (ref 0.30–0.70)
Heparin Unfractionated: 0.17 IU/mL — ABNORMAL LOW (ref 0.30–0.70)

## 2022-10-05 LAB — ECHOCARDIOGRAM COMPLETE
AR max vel: 1.18 cm2
AV Area VTI: 1.11 cm2
AV Area mean vel: 1.32 cm2
AV Mean grad: 10 mmHg
AV Peak grad: 17 mmHg
Ao pk vel: 2.06 m/s
Area-P 1/2: 3.72 cm2
Calc EF: 26.1 %
Height: 70 in
S' Lateral: 5.4 cm
Single Plane A2C EF: 25.2 %
Single Plane A4C EF: 28.6 %
Weight: 2666.68 oz

## 2022-10-05 LAB — POCT I-STAT EG7
Acid-Base Excess: 1 mmol/L (ref 0.0–2.0)
Bicarbonate: 25.1 mmol/L (ref 20.0–28.0)
Calcium, Ion: 1.21 mmol/L (ref 1.15–1.40)
HCT: 32 % — ABNORMAL LOW (ref 39.0–52.0)
Hemoglobin: 10.9 g/dL — ABNORMAL LOW (ref 13.0–17.0)
O2 Saturation: 59 %
Potassium: 4 mmol/L (ref 3.5–5.1)
Sodium: 141 mmol/L (ref 135–145)
TCO2: 26 mmol/L (ref 22–32)
pCO2, Ven: 38.6 mmHg — ABNORMAL LOW (ref 44–60)
pH, Ven: 7.421 (ref 7.25–7.43)
pO2, Ven: 30 mmHg — CL (ref 32–45)

## 2022-10-05 LAB — TROPONIN I (HIGH SENSITIVITY)
Troponin I (High Sensitivity): 2206 ng/L (ref <18)
Troponin I (High Sensitivity): 2258 ng/L (ref <18)

## 2022-10-05 LAB — MRSA NEXT GEN BY PCR, NASAL: MRSA by PCR Next Gen: NOT DETECTED

## 2022-10-05 SURGERY — RIGHT/LEFT HEART CATH AND CORONARY ANGIOGRAPHY
Anesthesia: LOCAL

## 2022-10-05 SURGERY — LEFT HEART CATH AND CORONARY ANGIOGRAPHY
Anesthesia: LOCAL

## 2022-10-05 MED ORDER — SODIUM CHLORIDE 0.9 % IV SOLN
INTRAVENOUS | Status: DC | PRN
  Administered 2022-10-05: 10 mL/h via INTRAVENOUS

## 2022-10-05 MED ORDER — IPRATROPIUM BROMIDE 0.06 % NA SOLN: 2.0000 | Freq: Three times a day (TID) | NASAL | Status: DC | PRN

## 2022-10-05 MED ORDER — LABETALOL HCL 5 MG/ML IV SOLN: 10.0000 mg | INTRAVENOUS | Status: AC | PRN

## 2022-10-05 MED ORDER — PERFLUTREN LIPID MICROSPHERE
1.0000 mL | INTRAVENOUS | Status: AC | PRN
  Administered 2022-10-05: 4 mL via INTRAVENOUS

## 2022-10-05 MED ORDER — MIDAZOLAM HCL 2 MG/2ML IJ SOLN
INTRAMUSCULAR | Status: AC
  Filled 2022-10-05: qty 2

## 2022-10-05 MED ORDER — ACETAMINOPHEN 325 MG PO TABS
650.0000 mg | ORAL_TABLET | ORAL | Status: DC | PRN
  Administered 2022-10-05 – 2022-10-09 (×2): 650 mg via ORAL
  Filled 2022-10-05 (×2): qty 2

## 2022-10-05 MED ORDER — LIDOCAINE HCL (PF) 1 % IJ SOLN
INTRAMUSCULAR | Status: DC | PRN
  Administered 2022-10-05: 12 mL
  Administered 2022-10-05: 5 mL

## 2022-10-05 MED ORDER — INSULIN ASPART 100 UNIT/ML IJ SOLN: 0.0000 [IU] | Freq: Three times a day (TID) | INTRAMUSCULAR | Status: DC

## 2022-10-05 MED ORDER — SODIUM CHLORIDE 0.9 % IV SOLN
INTRAVENOUS | Status: DC | PRN
  Administered 2022-10-05: 500 mL
  Administered 2022-10-05: 10 mL/h via INTRAVENOUS

## 2022-10-05 MED ORDER — FUROSEMIDE 40 MG PO TABS
40.0000 mg | ORAL_TABLET | Freq: Two times a day (BID) | ORAL | Status: DC
  Administered 2022-10-05 – 2022-10-07 (×6): 40 mg via ORAL
  Filled 2022-10-05 (×7): qty 1

## 2022-10-05 MED ORDER — SODIUM CHLORIDE 0.9% FLUSH
3.0000 mL | INTRAVENOUS | Status: DC | PRN
  Administered 2022-10-06: 3 mL via INTRAVENOUS

## 2022-10-05 MED ORDER — DIAZEPAM 5 MG PO TABS: 5.0000 mg | ORAL_TABLET | Freq: Three times a day (TID) | ORAL | Status: DC | PRN

## 2022-10-05 MED ORDER — ORAL CARE MOUTH RINSE: 15.0000 mL | OROMUCOSAL | Status: DC | PRN

## 2022-10-05 MED ORDER — HEPARIN BOLUS VIA INFUSION
3500.0000 [IU] | Freq: Once | INTRAVENOUS | Status: AC
  Administered 2022-10-05: 3500 [IU] via INTRAVENOUS
  Filled 2022-10-05: qty 3500

## 2022-10-05 MED ORDER — SODIUM CHLORIDE 0.9% FLUSH
10.0000 mL | Freq: Two times a day (BID) | INTRAVENOUS | Status: DC
  Administered 2022-10-05 – 2022-10-07 (×4): 10 mL

## 2022-10-05 MED ORDER — IOHEXOL 350 MG/ML SOLN
INTRAVENOUS | Status: DC | PRN
  Administered 2022-10-05: 100 mL via INTRA_ARTERIAL

## 2022-10-05 MED ORDER — LIDOCAINE HCL (PF) 1 % IJ SOLN
INTRAMUSCULAR | Status: AC
  Filled 2022-10-05: qty 30

## 2022-10-05 MED ORDER — HYDRALAZINE HCL 20 MG/ML IJ SOLN: 10.0000 mg | INTRAMUSCULAR | Status: AC | PRN

## 2022-10-05 MED ORDER — ATORVASTATIN CALCIUM 80 MG PO TABS
80.0000 mg | ORAL_TABLET | Freq: Every day | ORAL | Status: DC
  Administered 2022-10-06 – 2022-10-09 (×4): 80 mg via ORAL
  Filled 2022-10-05 (×4): qty 1

## 2022-10-05 MED ORDER — MIDAZOLAM HCL 2 MG/2ML IJ SOLN
INTRAMUSCULAR | Status: DC | PRN
  Administered 2022-10-05: 1 mg via INTRAVENOUS

## 2022-10-05 MED ORDER — MORPHINE SULFATE (PF) 2 MG/ML IV SOLN: 2.0000 mg | INTRAVENOUS | Status: DC | PRN

## 2022-10-05 MED ORDER — SODIUM CHLORIDE 0.9 % IV SOLN: 250.0000 mL | INTRAVENOUS | Status: DC | PRN

## 2022-10-05 MED ORDER — INSULIN ASPART 100 UNIT/ML IJ SOLN
0.0000 [IU] | Freq: Three times a day (TID) | INTRAMUSCULAR | Status: DC
  Administered 2022-10-08: 2 [IU] via SUBCUTANEOUS

## 2022-10-05 MED ORDER — CHLORHEXIDINE GLUCONATE CLOTH 2 % EX PADS
6.0000 | MEDICATED_PAD | Freq: Every day | CUTANEOUS | Status: DC
  Administered 2022-10-05 – 2022-10-08 (×4): 6 via TOPICAL

## 2022-10-05 MED ORDER — HEPARIN (PORCINE) IN NACL 1000-0.9 UT/500ML-% IV SOLN
INTRAVENOUS | Status: DC | PRN
  Administered 2022-10-05 (×2): 500 mL

## 2022-10-05 MED ORDER — ONDANSETRON HCL 4 MG/2ML IJ SOLN: 4.0000 mg | Freq: Four times a day (QID) | INTRAMUSCULAR | Status: DC | PRN

## 2022-10-05 MED ORDER — HEPARIN (PORCINE) 25000 UT/250ML-% IV SOLN
800.0000 [IU]/h | INTRAVENOUS | Status: DC
  Administered 2022-10-05: 750 [IU]/h via INTRAVENOUS
  Administered 2022-10-06: 800 [IU]/h via INTRAVENOUS
  Filled 2022-10-05 (×2): qty 250

## 2022-10-05 MED ORDER — HEPARIN SODIUM (PORCINE) 1000 UNIT/ML IJ SOLN
INTRAMUSCULAR | Status: DC | PRN
  Administered 2022-10-05: 4000 [IU] via INTRAVENOUS

## 2022-10-05 MED ORDER — SODIUM CHLORIDE 0.9% FLUSH
3.0000 mL | Freq: Two times a day (BID) | INTRAVENOUS | Status: DC
  Administered 2022-10-05 – 2022-10-08 (×6): 3 mL via INTRAVENOUS

## 2022-10-05 MED ORDER — ASPIRIN 81 MG PO CHEW
324.0000 mg | CHEWABLE_TABLET | Freq: Once | ORAL | Status: AC
  Administered 2022-10-05: 324 mg via ORAL
  Filled 2022-10-05: qty 4

## 2022-10-05 MED ORDER — HEPARIN SODIUM (PORCINE) 1000 UNIT/ML IJ SOLN
INTRAMUSCULAR | Status: AC
  Filled 2022-10-05: qty 10

## 2022-10-05 MED ORDER — ASPIRIN 81 MG PO TBEC
81.0000 mg | DELAYED_RELEASE_TABLET | Freq: Every day | ORAL | Status: DC
  Administered 2022-10-06 – 2022-10-10 (×5): 81 mg via ORAL
  Filled 2022-10-05 (×5): qty 1

## 2022-10-05 MED ORDER — SODIUM CHLORIDE 0.9% FLUSH: 10.0000 mL | INTRAVENOUS | Status: DC | PRN

## 2022-10-05 MED ORDER — NITROGLYCERIN 0.4 MG SL SUBL: 0.4000 mg | SUBLINGUAL_TABLET | SUBLINGUAL | Status: DC | PRN

## 2022-10-05 SURGICAL SUPPLY — 18 items
BALLN IABP SENSA PLUS 8F 50CC (BALLOONS) ×1
BALLOON IABP SENS PLUS 8F 50CC (BALLOONS) IMPLANT
CATH INFINITI 5FR AL1 (CATHETERS) IMPLANT
CATH INFINITI 5FR MULTPACK ANG (CATHETERS) IMPLANT
CATH SWAN GANZ 7F STRAIGHT (CATHETERS) IMPLANT
CATH VISTA GUIDE 6FRXBLAD3.5SH (CATHETERS) IMPLANT
ELECT DEFIB PAD ADLT CADENCE (PAD) IMPLANT
KIT ENCORE 26 ADVANTAGE (KITS) IMPLANT
KIT HEART LEFT (KITS) ×1 IMPLANT
KIT MICROPUNCTURE NIT STIFF (SHEATH) IMPLANT
PACK CARDIAC CATHETERIZATION (CUSTOM PROCEDURE TRAY) ×1 IMPLANT
SHEATH PINNACLE 6F 10CM (SHEATH) IMPLANT
SHEATH PINNACLE 7F 10CM (SHEATH) IMPLANT
SLEEVE REPOSITIONING LENGTH 30 (MISCELLANEOUS) IMPLANT
SYR MEDRAD MARK 7 150ML (SYRINGE) IMPLANT
TRANSDUCER W/STOPCOCK (MISCELLANEOUS) ×1 IMPLANT
TUBING CIL FLEX 10 FLL-RA (TUBING) ×1 IMPLANT
WIRE EMERALD 3MM-J .035X150CM (WIRE) IMPLANT

## 2022-10-05 NOTE — ED Notes (Signed)
Pt undressed, belongings placed into 1 bag and given to wife.  Belongings included: 1 blue t shirt, 1 gray jacket, 1 blue pants, 1 black belt, 1 gray boxers, 1 pair white socks, 1 pair dark slippers, 1 white ring, 1 white watch.

## 2022-10-05 NOTE — ED Provider Notes (Addendum)
[place] EMERGENCY DEPARTMENT AT [facility] Provider Note  CSN: [id number] Arrival date & time: 10/05/22 0023  Chief Complaint(s) Shortness of Breath and Chest Pain  HPI [name] Steele is a 82 y.o. male with a past medical history listed below who presents to the emergency department with 5 days of gradually worsening dyspnea on exertion and chest tightness.  Chest tightness is nonradiating.  It is constant for the past 5 days worse with exertion.  Patient denies any recent fevers or infections.  He has a dry cough which has been there for "a long while" without significant change.  He has lower extremity edema which is unchanged.  States that he is compliant with his diuretic.  Shortness of breath got worse this evening prompting a call to EMS.  EMS noted diminished lung sounds and expiratory wheezing.  He was given breathing treatments and route.  Patient reports feeling better after the breathing treatment.  The history is provided by the patient.    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 coli UTI 02/06/2022   Infrarenal abdominal aortic aneurysm (AAA) without rupture (HCC) 02/06/2022   Hypokalemia 02/06/2022   Normocytic anemia 02/06/2022   Coronary artery disease involving native coronary artery of native heart with angina pectoris (HCC) 04/14/2021   Heart murmur 04/14/2021   Nonspecific abnormal electrocardiogram (ECG) (EKG)  04/14/2021   Lower extremity edema 04/14/2021   Acute cholecystitis 07/04/2018   E coli bacteremia 04/11/2017   UTI (urinary tract infection) 04/10/2017   Sepsis (HCC) 04/10/2017   Type 2 diabetes mellitus without complication (HCC) 04/10/2017   Essential hypertension 04/10/2017   Home Medication(s) Prior to Admission medications   Medication Sig Start Date End Date Taking? Authorizing Provider  amLODipine (NORVASC) 5 MG tablet Take 1 tablet (5 mg total) by mouth daily. 07/23/22   Dyann Kief, PA-C  aspirin EC 81 MG tablet Take 1 tablet (81 mg total) by mouth daily. Swallow whole. 04/14/21   Jake Bathe, MD  carvedilol (COREG) 6.25 MG tablet TAKE 1 TABLET TWICE A DAY 05/25/22   Dunn, Kriste Basque N, PA-C  colchicine 0.6 MG tablet Take 0.6 mg by mouth daily as needed (gout).     [provider]  CRANBERRY PO Take 1 capsule by mouth daily.    [provider]  furosemide (LASIX) 20 MG tablet TAKE 1 TABLET BY MOUTH EVERY DAY AS NEEDED 04/24/22   Dunn, Kriste Basque N, PA-C  ipratropium (ATROVENT) 0.06 % nasal spray Place 2 sprays into both nostrils 3 (three) times daily as needed for rhinitis. 12/05/21   [provider]  metFORMIN (GLUCOPHAGE-XR) 750 MG 24 hr tablet Take 750 mg by mouth daily after breakfast. 06/05/18   [provider]  rosuvastatin (CRESTOR) 10 MG tablet TAKE 1 TABLET DAILY 05/08/22   Jake Bathe, MD  telmisartan (  MICARDIS) 80 MG tablet Take 80 mg by mouth daily.    [provider]                                                                                                                                    Allergies Erythromycin, Penicillin g, and Penicillins  Review of Systems Review of Systems As noted in HPI  Physical Exam Vital Signs  I have reviewed the triage vital signs BP (!) 87/63   Pulse 82   Temp 97.9 F (36.6 C)   Resp 19   Ht 5\' 10"  (1.778 m)   Wt 61.2 kg   SpO2 96%   BMI 19.37 kg/m   Physical Exam Vitals  reviewed.  Constitutional:      General: He is not in acute distress.    Appearance: He is well-developed. He is not diaphoretic.  HENT:     Head: Normocephalic and atraumatic.     Nose: Nose normal.  Eyes:     General: No scleral icterus.       Right eye: No discharge.        Left eye: No discharge.     Conjunctiva/sclera: Conjunctivae normal.     Pupils: Pupils are equal, round, and reactive to light.  Cardiovascular:     Rate and Rhythm: Normal rate and regular rhythm.     Heart sounds: No murmur heard.    No friction rub. No gallop.  Pulmonary:     Effort: Pulmonary effort is normal. No respiratory distress.     Breath sounds: No stridor. Examination of the right-lower field reveals rales. Examination of the left-lower field reveals rales. Rales present.  Abdominal:     General: There is no distension.     Palpations: Abdomen is soft.     Tenderness: There is no abdominal tenderness.  Musculoskeletal:        General: No tenderness.     Cervical back: Normal range of motion and neck supple.     Right lower leg: 2+ Pitting Edema present.     Left lower leg: 2+ Pitting Edema present.  Skin:    General: Skin is warm and dry.     Findings: No erythema or rash.  Neurological:     Mental Status: He is alert and oriented to person, place, and time.     ED Results and Treatments Labs (all labs ordered are listed, but only abnormal results are displayed) Labs Reviewed  BASIC METABOLIC PANEL - Abnormal; Notable for the following components:      Result Value   Glucose, Bld 150 (*)    Calcium 8.6 (*)    GFR, Estimated 60 (*)    All other components within normal limits  CBC - Abnormal; Notable for the following components:   RBC 3.56 (*)    Hemoglobin 11.3 (*)    HCT 36.3 (*)    MCV 102.0 (*)  All other components within normal limits  BRAIN NATRIURETIC PEPTIDE - Abnormal; Notable for the following components:   B Natriuretic Peptide 1,750.0 (*)    All other  components within normal limits  HEPARIN LEVEL (UNFRACTIONATED) - Abnormal; Notable for the following components:   Heparin Unfractionated <0.10 (*)    All other components within normal limits  LACTIC ACID, PLASMA - Abnormal; Notable for the following components:   Lactic Acid, Venous 2.3 (*)    All other components within normal limits  CBG MONITORING, ED - Abnormal; Notable for the following components:   Glucose-Capillary 136 (*)    All other components within normal limits  TROPONIN I (HIGH SENSITIVITY) - Abnormal; Notable for the following components:   Troponin I (High Sensitivity) 2,206 (*)    All other components within normal limits  TROPONIN I (HIGH SENSITIVITY) - Abnormal; Notable for the following components:   Troponin I (High Sensitivity) 2,258 (*)    All other components within normal limits  LACTIC ACID, PLASMA                                                                                                                         EKG  EKG Interpretation  Date/Time:  Monday Oct 05 2022 00:33:27 EDT Ventricular Rate:  89 PR Interval:  269 QRS Duration: 94 QT Interval:  327 QTC Calculation: 398 R Axis:   -33 Text Interpretation: Sinus rhythm Ventricular premature complex Prolonged PR interval Left axis deviation Borderline low voltage, extremity leads Abnormal R-wave progression, late transition Repol abnrm, severe global ischemia (LM/MVD) Confirmed by Drema Pry [phone]) on 10/05/2022 1:39:33 AM       Radiology DG Chest Portable 1 View  Result Date: 10/05/2022 CLINICAL DATA:  Altered mental status. EXAM: PORTABLE CHEST 1 VIEW COMPARISON:  Chest radiograph dated 02/06/2022 and CT dated 04/06/2022. FINDINGS: There is diffuse interstitial coarsening and nodularity involving the mid to lower lung field bilaterally. Probable small bilateral pleural effusions and bibasilar atelectasis or infiltrate. No pneumothorax. Top-normal cardiac size. Atherosclerotic calcification  of the aorta. No acute osseous pathology. IMPRESSION: 1. Small bilateral pleural effusions and bibasilar atelectasis or infiltrate. 2. Diffuse interstitial coarsening and nodularity. Electronically Signed   By: Elgie Collard M.D.   On: 10/05/2022 02:08    Medications Ordered in ED Medications  heparin ADULT infusion 100 units/mL (25000 units/274mL) (750 Units/hr Intravenous New Bag/Given 10/05/22 0331)  aspirin EC tablet 81 mg (has no administration in time range)  acetaminophen (TYLENOL) tablet 650 mg (has no administration in time range)  ondansetron (ZOFRAN) injection 4 mg (has no administration in time range)  atorvastatin (LIPITOR) tablet 80 mg (has no administration in time range)  aspirin chewable tablet 324 mg (324 mg Oral Given 10/05/22 0128)  heparin bolus via infusion 3,500 Units (3,500 Units Intravenous Bolus from Bag 10/05/22 0328)   Procedures .Critical Care  Performed by: Nira Conn, MD Authorized by: Nira Conn, MD   Critical care provider statement:  Critical care time (minutes):  60   Critical care time was exclusive of:  Separately billable procedures and treating other patients   Critical care was necessary to treat or prevent imminent or life-threatening deterioration of the following conditions:  Cardiac failure   Critical care was time spent personally by me on the following activities:  Development of treatment plan with patient or surrogate, discussions with consultants, evaluation of patient's response to treatment, examination of patient, obtaining history from patient or surrogate, review of old charts, re-evaluation of patient's condition, pulse oximetry, ordering and review of radiographic studies, ordering and review of laboratory studies and ordering and performing treatments and interventions   Care discussed with: admitting provider     (including critical care time) Medical Decision Making / ED Course  Click here for ABCD2,  HEART and other calculators  Medical Decision Making Amount and/or Complexity of Data Reviewed Labs: ordered. Radiology: ordered.  Risk OTC drugs. Prescription drug management. Decision regarding hospitalization.    The patient presents to the ED for the following: Chest pain and DOE  Key initial findings: Peripheral edema Satting 94% on RA  Additional history obtained: Stress test in February of this year was Low risk.  Did note severe resting hypertension. Echo from September 2023 showed EF of 60 to 65% without any wall motion abnormality.  Diastolic function normal.  This involves an extensive number of treatment options, and is a complaint that carries with it a high risk of complications and morbidity. The differential diagnosis includes but not limited to:  New onset heart failure, ACS, pericardial effusion, pleural effusion, pneumonia, reactive airway disease.  Considered but feel these are less likely, acute aortic process, pulmonary embolism.  Co-morbidities/SDOH that complicate the patient evaluation/care: CKD stage III, diabetes, interstitial lung disease  Hospitalization considered:  Yes  Initial intervention:  Aspirin  Work up Interpretation and Management:  Cardiac Monitoring/EKG: Telemetry with normal sinus rhythm and rates in the 90s. EKG with new ST depressions in the inferior lateral leads.  Laboratory Tests ordered listed below with my independent interpretation:     Imaging Studies ordered listed below with my independent interpretation:   ED Course:  Clinical Course as of 10/05/22 0455  Mon Oct 05, 2022  0251 CBC w/o Leukocytosis. Stable Hb.  Metabolic panel without significant electrolyte derangements or renal sufficiency.  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  No obvious pneumonia.  Mild pleural effusions.  BNP elevated at 1750  Troponin elevated at 2200  Disease concerning for NSTEMI with heart  failure.  Consulting cardiology for admission.   [PC]  [phone] Cardiology evaluated the patient in the emergency department and will take patient straight to the Cath Lab [PC]  0454 Lactic acid elevated due to cardiogenic hypoperfusion not sepsis  [PC]    Clinical Course User Index [PC] Allisha Harter, Amadeo Garnet, MD    Final Clinical Impression(s) / ED Diagnoses Final diagnoses:  NSTEMI (non-ST elevated myocardial infarction) (HCC)  Heart failure, unspecified HF chronicity, unspecified heart failure type [facility])           This chart was dictated using voice recognition software.  Despite best efforts to proofread,  errors can occur which can change the documentation meaning.      Nira Conn, MD 10/05/22 [phone]

## 2022-10-05 NOTE — Progress Notes (Signed)
   10/05/22 0506  Spiritual Encounters  Type of Visit Initial  Care provided to: Pt and family  Referral source Code page  Reason for visit Code (STEMI)  OnCall Visit Yes  Spiritual Framework  Presenting Themes Values and beliefs;Courage hope and growth  Interventions  Spiritual Care Interventions Made Established relationship of care and support;Compassionate presence;Reflective listening;Narrative/life review;Explored values/beliefs/practices/strengths;Prayer;Encouragement  Intervention Outcomes  Outcomes Connection to spiritual care;Awareness around self/spiritual resourses;Awareness of health;Awareness of support;Reduced anxiety;Reduced isolation;Patient family open to resources  Spiritual Care Plan  Spiritual Care Issues Still Outstanding No further spiritual care needs at this time (see row info)   Chaplain Responded to a code STEMI page. Patient was alert and oriented. Wife was at bedside. Chaplain provided emotional and spiritual support for patient and spouse. Patient is aware of 24/7 chaplain services. Patient participated in life review and spouse requested prayer.   Arlyce Dice, Chaplain Resident [phone]

## 2022-10-05 NOTE — Progress Notes (Signed)
Hypoglycemic Event  CBG: 69  Treatment: 4 oz juice/soda  Symptoms: None  Follow-up CBG: Time:1722 CBG Result:108  Possible Reasons for Event: Inadequate meal intake  Comments/MD notified:Evan [place], [place]    Marcial Pacas

## 2022-10-05 NOTE — Progress Notes (Signed)
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 Tolerated CTX 11/23 admit     Patient Measurements: Height: 5\' 10"  (177.8 cm) Weight: 75.6 kg (166 lb 10.7 oz) IBW/kg (Calculated) : 73 Heparin Dosing Weight: 61 kg  Vital Signs: Temp: 97.8 F (36.6 C) (05/20 1639) Temp Source: Oral (05/20 1639) BP: 144/79 (05/20 1745) Pulse Rate: 60 (05/20 1745)  Labs: Recent Labs    10/05/22 0130 10/05/22 0248 10/05/22 0320 10/05/22 0543 10/05/22 0544 10/05/22 1059 10/05/22 1640  HGB 11.3*  --   --  10.9* 10.9*  --   --   HCT 36.3*  --   --  32.0* 32.0*  --   --   PLT 208  --   --   --   --   --   --   HEPARINUNFRC  --   --  <0.10*  --   --  0.43 0.17*  CREATININE 1.21  --   --   --   --   --   --   TROPONINIHS 2,206* 2,258*  --   --   --   --   --     Estimated Creatinine Clearance: 48.6 mL/min (by C-G formula based on SCr of 1.21 mg/dL).   Medications:  Scheduled:   [START ON 10/06/2022] aspirin EC  81 mg Oral Daily   atorvastatin  80 mg Oral Daily   Chlorhexidine Gluconate Cloth  6 each Topical Daily   furosemide  40 mg Oral BID   insulin aspart  0-15 Units Subcutaneous TID WC   sodium chloride flush  10-40 mL Intracatheter Q12H   sodium chloride flush  3 mL Intravenous Q12H    Infusions:   sodium chloride     sodium chloride 10 mL/hr at 10/05/22 1700   heparin 650 Units/hr (10/05/22 1700)    Assessment: 82 years of age male with IABP and multiple vessel CAD on IV Heparin therapy.   Heparin level 0.17 - slightly below goal on 650 units/hr.  Bicarbonate purge solution.   Goal of Therapy:  Heparin level 0.2 to 0.5 units/ml Monitor platelets by anticoagulation protocol: Yes   Plan:  Increase Heparin to 700 units/hr.  Recheck heparin level in 8 hours.  Daily heparin level and CBC while on therapy.   Link Snuffer, PharmD, BCPS, BCCCP Clinical Pharmacist Please refer to [facility] for [facility] numbers 10/05/2022,6:14 PM

## 2022-10-05 NOTE — Progress Notes (Signed)
Orthopedic Tech Progress Note Patient Details:  [name] Steele 10-Aug-1940 [id number]  Ortho Devices Type of Ortho Device: Knee Immobilizer Ortho Device/Splint Location: RLE Ortho Device/Splint Interventions: Ordered, Application, Adjustment   Post Interventions Patient Tolerated: Well Instructions Provided: Care of device  [name] 10/05/2022, 9:14 AM

## 2022-10-05 NOTE — H&P (Signed)
Cardiology Admission History and Physical   Patient ID: [name] Steele MRN: [id number]; DOB: [birth year]   Admission date: 10/05/2022  PCP:  [name], MD   [facility] Providers Cardiologist:  Donato Schultz, MD        Chief Complaint:  Chest pain  Patient Profile:   [name] Steele is a 82 y.o. male with pmh sx for CKD, DM, HTN, Mild AS, Mild dilatation of ascending aorta and CAC of 600 on CT scan  who is being seen 10/05/2022 for the evaluation of NSTEMI.  History of Present Illness:   [name] Steele is a 82 y.o. male with pmh sx for CKD, DM, HTN, Mild AS, Mild dilatation of ascending aorta and CAC of 600 on CT scan  who is being seen 10/05/2022 for the evaluation of NSTEMI. Patient had a mildly positive stress test in Feb 2024- at that time his BP meds were increased. He had been doing well since then; however yesterday started having sudden onset CP. He had been having SOB since 5 days. His chest feels tight. The pain is across whole upper chest. Hence he called EMS.   Per EMS lungs diminished, exp wheezing.  Given total 15mg  albuterol and 1mg  atrovent with improvement in lung sounds. Here in the ED, EKG showed diffuse ST depressions with AVR elevation. Troponin >2K, BNP also around 2K. His CP now is 3/10. His blood pressure is 78/50s; usually 130s. He has leg swelling +2 pitting edema- states has been there for some time. States he is compliant with his medications. Cardiology was called for admission. Of note, he had a CAC score of 6000. Echo in 2023 showed normal LVEF.    Past Medical History:  Diagnosis Date   Arthritis    Chronic kidney disease, stage 3a (HCC)    Chronic urinary tract infection    Coronary artery calcification seen on CT scan    Diabetes mellitus without complication (HCC)    Gout    Heart murmur    Hypertension    ILD (interstitial lung disease) (HCC)    by CT 01/2021   Mild aortic stenosis    Mild dilation of ascending aorta (HCC)     Osteoporosis     Past Surgical History:  Procedure Laterality Date   CHOLECYSTECTOMY N/A 07/04/2018   Procedure: LAPAROSCOPIC CHOLECYSTECTOMY WITH INTRAOPERATIVE CHOLANGIOGRAM;  Surgeon: Abigail Miyamoto, MD;  Location: [facility];  Service: General;  Laterality: N/A;   ERCP N/A 07/05/2018   Procedure: ENDOSCOPIC RETROGRADE CHOLANGIOPANCREATOGRAPHY (ERCP);  Surgeon: Vida Rigger, MD;  Location: [facility];  Service: Endoscopy;  Laterality: N/A;   INGUINAL HERNIA REPAIR Right 07/31/2022   Procedure: OPEN RIGHT INGUINAL HERNIA REPAIR WITH MESH;  Surgeon: Berna Bue, MD;  Location: [facility];  Service: General;  Laterality: Right;   JOINT REPLACEMENT Bilateral    REMOVAL OF STONES  07/05/2018   Procedure: REMOVAL OF STONES;  Surgeon: Vida Rigger, MD;  Location: [facility];  Service: Endoscopy;;   SPHINCTEROTOMY  07/05/2018   Procedure: Dennison Mascot;  Surgeon: Vida Rigger, MD;  Location: [facility];  Service: Endoscopy;;     Medications Prior to Admission: Prior to Admission medications   Medication Sig Start Date End Date Taking? Authorizing Provider  amLODipine (NORVASC) 5 MG tablet Take 1 tablet (5 mg total) by mouth daily. 07/23/22   Dyann Kief, PA-C  aspirin EC 81 MG tablet Take 1 tablet (81 mg total) by mouth daily. Swallow whole. 04/14/21   Jake Bathe, MD  carvedilol (COREG) 6.25 MG tablet TAKE 1 TABLET TWICE A DAY 05/25/22   Dunn, Kriste Basque N, PA-C  colchicine 0.6 MG tablet Take 0.6 mg by mouth daily as needed (gout).     [provider]  CRANBERRY PO Take 1 capsule by mouth daily.    [provider]  furosemide (LASIX) 20 MG tablet TAKE 1 TABLET BY MOUTH EVERY DAY AS NEEDED 04/24/22   Dunn, Kriste Basque N, PA-C  ipratropium (ATROVENT) 0.06 % nasal spray Place 2 sprays into both nostrils 3 (three) times daily as needed for rhinitis. 12/05/21   [provider]  metFORMIN (GLUCOPHAGE-XR) 750 MG 24 hr tablet Take 750 mg by mouth daily after breakfast. 06/05/18    [provider]  rosuvastatin (CRESTOR) 10 MG tablet TAKE 1 TABLET DAILY 05/08/22   Jake Bathe, MD  telmisartan (MICARDIS) 80 MG tablet Take 80 mg by mouth daily.    [provider]     Allergies:    Allergies  Allergen Reactions   Erythromycin Other (See Comments)    Severe Abdominal Pain   Penicillin G Other (See Comments)   Penicillins Hives    Has patient had a PCN reaction causing immediate rash, facial/tongue/throat swelling, SOB or lightheadedness with hypotension:  Yes  Has patient had a PCN reaction causing severe rash involving mucus membranes or skin necrosis:  No Has patient had a PCN reaction that required hospitalization: No Has patient had a PCN reaction occurring within the last 10 years: No If all of the above answers are "NO", then may proceed with Cephalosporin use. Tolerated CTX 11/23 admit     Social History:   Social History   Socioeconomic History   Marital status: Married    Spouse name: Not on file   Number of children: Not on file   Years of education: Not on file   Highest education level: Not on file  Occupational History   Not on file  Tobacco Use   Smoking status: Former    Packs/day: 1.00    Years: 31.00    Additional pack years: 0.00    Total pack years: 31.00    Types: Cigarettes    Quit date: 77    Years since quitting: 36.4   Smokeless tobacco: Never  Vaping Use   Vaping Use: Never used  Substance and Sexual Activity   Alcohol use: Yes    Alcohol/week: 0.0 standard drinks of alcohol    Comment: social   Drug use: No   Sexual activity: Not Currently  Other Topics Concern   Not on file  Social History Narrative   Not on file   Social Determinants of Health   Financial Resource Strain: Not on file  Food Insecurity: No Food Insecurity (02/06/2022)   Hunger Vital Sign    Worried About Running Out of Food in the Last Year: Never true    Ran Out of Food in the Last Year: Never true  Transportation Needs:  No Transportation Needs (02/06/2022)   PRAPARE - Administrator, Civil Service (Medical): No    Lack of Transportation (Non-Medical): No  Physical Activity: Not on file  Stress: Not on file  Social Connections: Not on file  Intimate Partner Violence: Not At Risk (02/06/2022)   Humiliation, Afraid, Rape, and Kick questionnaire    Fear of Current or Ex-Partner: No    Emotionally Abused: No    Physically Abused: No    Sexually Abused: No    Family History:  The patient's family history includes Heart disease in his sister.    ROS:  Please see the history of present illness. All other ROS reviewed and negative.     Physical Exam/Data:   Vitals:   10/05/22 0255 10/05/22 0300 10/05/22 0330 10/05/22 0400  BP: (!) 78/54 (!) 86/58 (!) 77/55 (!) 81/55  Pulse: 71 73 69 73  Resp: 19 (!) 22 20 18   Temp:      TempSrc:      SpO2: 93% 93% 91% 92%  Weight:      Height:       No intake or output data in the 24 hours ending 10/05/22 0434    10/05/2022   12:33 AM 07/31/2022   10:44 AM 07/31/2022   10:27 AM  Last 3 Weights  Weight (lbs) 135 lb 147 lb 11.3 oz 147 lb 11.3 oz  Weight (kg) 61.236 kg 67 kg 67 kg     Body mass index is 19.37 kg/m.  General:  Well nourished, well developed, in no acute distress HEENT: normal Neck: Elevated JVD Vascular: No carotid bruits; Distal pulses 2+ bilaterally   Cardiac:  normal S1, S2; RRR; systolic murmur heard Lungs: Crackles at bases Abd: soft, nontender, no hepatomegaly  Ext: +2 pitting edema Musculoskeletal:  No deformities, BUE and BLE strength normal and equal Skin: warm and dry  Neuro:  CNs 2-12 intact, no focal abnormalities noted Psych:  Normal affect    EKG:  The ECG that was done = was personally reviewed and demonstrates diffuse ST depressions and AVR ST elevation  Relevant CV Studies:  Stress Test Feb 2024:    Findings are equivocal. The study is low risk.   Left ventricular function is abnormal. Global function is  mildly reduced. There was a single regional abnormality at apex End diastolic cavity size is normal. End systolic cavity size is normal.   Prior study available for comparison from 05/09/2021.   ST depression and T wave inversion in inferior alteral leads with Lexiscan Severe resting HTN No ischemia on perfusion images with apical thinning Estimated EF 50% with apical hypokinesis   Echo Sep 2023:    1. Left ventricular ejection fraction, by estimation, is 60 to 65%. The  left ventricle has normal function. The left ventricle has no regional  wall motion abnormalities. There is mild left ventricular hypertrophy.  Left ventricular diastolic parameters  were normal.   2. Right ventricular systolic function is normal. The right ventricular  size is normal. There is normal pulmonary artery systolic pressure.   3. The mitral valve is abnormal. No evidence of mitral valve  regurgitation. No evidence of mitral stenosis.   4. Gradient stable since TTE 10/14/21. The aortic valve is tricuspid.  There is moderate calcification of the aortic valve. There is moderate  thickening of the aortic valve. Aortic valve regurgitation is not  visualized. Mild aortic valve stenosis.   5. The inferior vena cava is normal in size with greater than 50%  respiratory variability, suggesting right atrial pressure of 3 mmHg.     Laboratory Data:  High Sensitivity Troponin:   Recent Labs  Lab 10/05/22 0130 10/05/22 0248  TROPONINIHS 2,206* 2,258*      Chemistry Recent Labs  Lab 10/05/22 0130  NA 138  K 3.9  CL 103  CO2 25  GLUCOSE 150*  BUN 16  CREATININE 1.21  CALCIUM 8.6*  GFRNONAA 60*  ANIONGAP 10    No results for input(s): "PROT", "ALBUMIN", "AST", "ALT", "ALKPHOS", "  BILITOT" in the last 168 hours. Lipids No results for input(s): "CHOL", "TRIG", "HDL", "LABVLDL", "LDLCALC", "CHOLHDL" in the last 168 hours. Hematology Recent Labs  Lab 10/05/22 0130  WBC 6.8  RBC 3.56*  HGB 11.3*  HCT  36.3*  MCV 102.0*  MCH 31.7  MCHC 31.1  RDW 14.6  PLT 208   Thyroid No results for input(s): "TSH", "FREET4" in the last 168 hours. BNP Recent Labs  Lab 10/05/22 0130  BNP 1,750.0*    DDimer No results for input(s): "DDIMER" in the last 168 hours.   Radiology/Studies:  DG Chest Portable 1 View  Result Date: 10/05/2022 CLINICAL DATA:  Altered mental status. EXAM: PORTABLE CHEST 1 VIEW COMPARISON:  Chest radiograph dated 02/06/2022 and CT dated 04/06/2022. FINDINGS: There is diffuse interstitial coarsening and nodularity involving the mid to lower lung field bilaterally. Probable small bilateral pleural effusions and bibasilar atelectasis or infiltrate. No pneumothorax. Top-normal cardiac size. Atherosclerotic calcification of the aorta. No acute osseous pathology. IMPRESSION: 1. Small bilateral pleural effusions and bibasilar atelectasis or infiltrate. 2. Diffuse interstitial coarsening and nodularity. Electronically Signed   By: Elgie Collard M.D.   On: 10/05/2022 02:08     Assessment and Plan:   # NSTEMI # Acute HF # HTN # T2DM # Mild AS # Mild Ascending Aorta Dilatation  -Patient presenting with CP. Troponin 2K. EKG showed diffuse ST depressions and AVR ST elevation -He had a positive stress test in Feb 2024. CAC score of 6000 -His BP currently is much softer than usual + has signs symptoms of HF -Considering this, will activate cath lab for emergent LHC. I suspect he has severe 3VD or LM/prox LAD and may need IABP -Aspirin 325 mg -Aspirin 81 mg from tomorrow -IV Heparin -Atorvastatin 80 mg -Hold Plavix for now -Echo -Telemetry -NPO -Because of soft BP, will hold lasix for now. After LHC, IV Lasix 60 mg BID   For questions or updates, please contact [facility] Please consult [email or web address] for contact info under     Signed, Hermelinda Dellen, MD  10/05/2022 4:34 AM

## 2022-10-05 NOTE — Progress Notes (Addendum)
ANTICOAGULATION CONSULT NOTE - Initial Consult  Pharmacy Consult for Heparin Indication: chest pain/ACS, multiple vessel CAD  Allergies  Allergen Reactions   Erythromycin Other (See Comments)    Severe Abdominal Pain   Penicillin G Other (See Comments)   Penicillins Hives    Has patient had a PCN reaction causing immediate rash, facial/tongue/throat swelling, SOB or lightheadedness with hypotension:  Yes  Has patient had a PCN reaction causing severe rash involving mucus membranes or skin necrosis:  No Has patient had a PCN reaction that required hospitalization: No Has patient had a PCN reaction occurring within the last 10 years: No If all of the above answers are "NO", then may proceed with Cephalosporin use. Tolerated CTX 11/23 admit     Patient Measurements: Height: 5\' 10"  (177.8 cm) Weight: 61.2 kg (135 lb) IBW/kg (Calculated) : 73 Heparin Dosing Weight: 61 kg  Vital Signs: Temp: 97.9 F (36.6 C) (05/20 0032) Temp Source: Oral (05/20 0032) BP: 87/65 (05/20 0100) Pulse Rate: 79 (05/20 0200)  Labs: Recent Labs    10/05/22 0130  HGB 11.3*  HCT 36.3*  PLT 208  CREATININE 1.21  TROPONINIHS 2,206*    Estimated Creatinine Clearance: 40.7 mL/min (by C-G formula based on SCr of 1.21 mg/dL).   Medical History: Past Medical History:  Diagnosis Date   Arthritis    Chronic kidney disease, stage 3a (HCC)    Chronic urinary tract infection    Coronary artery calcification seen on CT scan    Diabetes mellitus without complication (HCC)    Gout    Heart murmur    Hypertension    ILD (interstitial lung disease) (HCC)    by CT 01/2021   Mild aortic stenosis    Mild dilation of ascending aorta (HCC)    Osteoporosis     Medications:  Awaiting home med rec  Assessment: 82 y.o. M presents with CP. Trop up to 2206. To begin heparin for ACS. No AC PTA. Hgb 11.3, plt wnl.  Goal of Therapy:  Heparin level 0.3-0.7 units/ml Monitor platelets by anticoagulation  protocol: Yes   Plan:  Heparin IV bolus 3500 Heparin gtt at 750 units/hr Will f/u heparin level in 8 hours Daily heparin level and CBC  Christoper Fabian, PharmD, BCPS Please see amion for complete clinical pharmacist phone list 10/05/2022,2:56 AM  Addendum (0700) S/p cath lab - found to have multiple vessel CAD and LVEF estimated at 30%. IABP placed. To restart heparin. New goal 0.2-0.5 units/ml Plan: Restart heparin at 650 units/hr F/u 8 hr heparin level  Christoper Fabian, PharmD, BCPS Please see amion for complete clinical pharmacist phone list 10/05/2022 7:01 AM

## 2022-10-05 NOTE — ED Notes (Signed)
Blue top sent to lab. 

## 2022-10-05 NOTE — ED Triage Notes (Signed)
Pt to ED via [facility] from home c/o SOB x1 week with intermittent clear cough, tonight chest pain started across upper chest that is sore.  States pain worse with deep breaths.  Per EMS lungs diminished, exp wheezing.  Given total 15mg  albuterol and 1mg  atrovent with improvement in lung sounds.

## 2022-10-05 NOTE — ED Notes (Signed)
Pt placed on 2L [place] for RA sat of 88%, rebound sat 94%.

## 2022-10-05 NOTE — Interval H&P Note (Signed)
History and Physical Interval Note:  10/05/2022 5:20 AM  [name] Steele  has presented today for surgery, with the diagnosis of STEMI.  The various methods of treatment have been discussed with the patient and family. After consideration of risks, benefits and other options for treatment, the patient has consented to  Procedure(s): LEFT HEART CATH AND CORONARY ANGIOGRAPHY (N/A) as a surgical intervention.  The patient's history has been reviewed, patient examined, no change in status, stable for surgery.  I have reviewed the patient's chart and labs.  Questions were answered to the patient's satisfaction.     Tonny Bollman

## 2022-10-05 NOTE — Consult Note (Addendum)
[address]       [place] [address]             [phone]        [name] Steele [facility] Medical Record #[id number] Date of Birth: [birth year]  Referring: Tonny Bollman, MD  Primary Care: [name], MD Primary Cardiologist: Donato Schultz, MD  Reason for Consult:  Evaluation for potential surgical management of critical left main and severe multi-vessel coronary artery disease after presenting with acute non-ST elevation myocardial infarction, acute congestive heart failure and cardiogenic shock.   History of Present Illness:     [name] Steele is an 82 year old gentleman with a past history notable for hypertension, type 2 diabetes mellitus, renal abdominal aortic aneurysm, stage III CKD, interstitial lung disease, history of known heart murmur with previous diagnosis of "mild aortic stenosis".  He was brought to the [facility] emergency room by [facility] late last night after reporting progressive dyspnea on exertion and chest tightness which had worsened over the previous 5 days.  According to the EMS staff, patient had diminished breath sounds bilaterally  with wheezing.  He was given a respiratory treatment en route with improvement of his respiratory symptoms.  In the emergency room EKG was obtained that showed prolonged PR interval, left axis deviation, and poor R wave progression consistent with significant global ischemia.  Chest x-ray showed coarse interstitial nodularity and small bilateral pleural effusions.  High-sensitivity troponin was elevated at 2200 but relatively flat on serial exams.  BNP was also elevated at 1750.  [name] Steele was admitted by the cardiology service with acute non-ST elevation myocardial infarction and acute systolic heart failure.  He was started on aspirin and a heparin infusion.  Shortly after admission, he was taken to the Cath Lab where left heart catheterization was carried out demonstrating Critical  95% stenosis of the left main coronary artery.  There was also a 90% proximal and mid LAD stenosis and 95% stenosis of the obtuse marginal coronary artery.  The nondominant RCA did not have any obstructive disease.  Left ventricular ejection fraction was estimated at 30% by LV gram.  There was mild aortic insufficiency also assessed by supravalvular aortogram.  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  An intra-aortic balloon pump was placed in the Cath Lab and is currently counter pulsating at a ratio of 1:1.  CT surgery was asked to assist in evaluating [name] Steele when an appropriate time for revascularization given his advanced age, initial presentation with and comorbidities including diabetes, stage IIIa chronic kidney disease, and history of interstitial lung disease.  [name] Steele is currently resting in bed with his wife at the bedside. He is alert and oriented and denies any chest pain or shortness of breath since returning from the cath lab. He tells me the primary problem has been the coughing that has persisted for the past 7 days. He felt the chest discomfort he was experiencing was soreness from the excessive coughing.  He is a retired [employer] [phone]).   He has been walking for exercise for the past several weeks until about 5 days ago.    Current Activity/ Functional Status: Patient is not independent with mobility/ambulation, transfers, ADL's, IADL's.   Zubrod Score: At the time of surgery this patient's most appropriate activity status/level should be described as: []     0    Normal activity, no  symptoms []     1    Restricted in physical strenuous activity but ambulatory, able to do out light work []     2    Ambulatory and capable of self care, unable to do work activities, up and about                 more than 50%  Of the time                            []     3    Only limited self care, in bed greater  than 50% of waking hours [x]     4    Completely disabled, no self care, confined to bed or chair []     5    Moribund  Past Medical History:  Diagnosis Date   Arthritis    Chronic kidney disease, stage 3a (HCC)    Chronic urinary tract infection    Coronary artery calcification seen on CT scan    Diabetes mellitus without complication (HCC)    Gout    Heart murmur    Hypertension    ILD (interstitial lung disease) (HCC)    by CT 01/2021   Mild aortic stenosis    Mild dilation of ascending aorta (HCC)    Osteoporosis     Past Surgical History:  Procedure Laterality Date   CHOLECYSTECTOMY N/A 07/04/2018   Procedure: LAPAROSCOPIC CHOLECYSTECTOMY WITH INTRAOPERATIVE CHOLANGIOGRAM;  Surgeon: Abigail Miyamoto, MD;  Location: [facility];  Service: General;  Laterality: N/A;   ERCP N/A 07/05/2018   Procedure: ENDOSCOPIC RETROGRADE CHOLANGIOPANCREATOGRAPHY (ERCP);  Surgeon: Vida Rigger, MD;  Location: [facility];  Service: Endoscopy;  Laterality: N/A;   INGUINAL HERNIA REPAIR Right 07/31/2022   Procedure: OPEN RIGHT INGUINAL HERNIA REPAIR WITH MESH;  Surgeon: Berna Bue, MD;  Location: [facility];  Service: General;  Laterality: Right;   JOINT REPLACEMENT Bilateral    REMOVAL OF STONES  07/05/2018   Procedure: REMOVAL OF STONES;  Surgeon: Vida Rigger, MD;  Location: [facility];  Service: Endoscopy;;   SPHINCTEROTOMY  07/05/2018   Procedure: Dennison Mascot;  Surgeon: Vida Rigger, MD;  Location: [facility];  Service: Endoscopy;;    Social History   Tobacco Use  Smoking Status Former   Packs/day: 1.00   Years: 31.00   Additional pack years: 0.00   Total pack years: 31.00   Types: Cigarettes   Quit date: 18   Years since quitting: 36.4  Smokeless Tobacco Never    Social History   Substance and Sexual Activity  Alcohol Use Yes   Alcohol/week: 0.0 standard drinks of alcohol   Comment: social     Allergies  Allergen Reactions   Erythromycin Other (See Comments)    Severe  Abdominal Pain   Penicillin G Other (See Comments)   Penicillins Hives    Has patient had a PCN reaction causing immediate rash, facial/tongue/throat swelling, SOB or lightheadedness with hypotension:  Yes  Has patient had a PCN reaction causing severe rash involving mucus membranes or skin necrosis:  No Has patient had a PCN reaction that required hospitalization: No Has patient had a PCN reaction occurring within the last 10 years: No If all of the above answers are "NO", then may proceed with Cephalosporin use. Tolerated CTX 11/23 admit     Current Facility-Administered Medications  Medication Dose Route Frequency Provider Last Rate Last Admin   0.9 %  sodium chloride infusion  250  mL Intravenous PRN Tonny Bollman, MD       0.9 %  sodium chloride infusion    Continuous PRN Tonny Bollman, MD 10 mL/hr at 10/05/22 1000 Infusion Verify at 10/05/22 1000   acetaminophen (TYLENOL) tablet 650 mg  650 mg Oral Q4H PRN Tonny Bollman, MD       [START ON 10/06/2022] aspirin EC tablet 81 mg  81 mg Oral Daily Tonny Bollman, MD       atorvastatin (LIPITOR) tablet 80 mg  80 mg Oral Daily Tonny Bollman, MD       Chlorhexidine Gluconate Cloth 2 % PADS 6 each  6 each Topical Daily Tonny Bollman, MD       diazepam (VALIUM) tablet 5 mg  5 mg Oral Q8H PRN Tonny Bollman, MD       heparin ADULT infusion 100 units/mL (25000 units/265mL)  650 Units/hr Intravenous Continuous Titus Mould, RPH 6.5 mL/hr at 10/05/22 1000 650 Units/hr at 10/05/22 1000   hydrALAZINE (APRESOLINE) injection 10 mg  10 mg Intravenous Q20 Min PRN Tonny Bollman, MD       ipratropium (ATROVENT) 0.06 % nasal spray 2 spray  2 spray Each Nare TID PRN Hermelinda Dellen, MD       labetalol (NORMODYNE) injection 10 mg  10 mg Intravenous Q10 min PRN Tonny Bollman, MD       morphine (PF) 2 MG/ML injection 2 mg  2 mg Intravenous Q1H PRN Tonny Bollman, MD       ondansetron [facility]) injection 4 mg  4 mg Intravenous Q6H PRN Tonny Bollman, MD       Oral care mouth rinse  15 mL Mouth Rinse PRN Tonny Bollman, MD       perflutren lipid microspheres (DEFINITY) IV suspension  1-10 mL Intravenous PRN Tonny Bollman, MD   4 mL at 10/05/22 0955   sodium chloride flush (NS) 0.9 % injection 10-40 mL  10-40 mL Intracatheter Q12H Tonny Bollman, MD       sodium chloride flush (NS) 0.9 % injection 10-40 mL  10-40 mL Intracatheter PRN Tonny Bollman, MD       sodium chloride flush (NS) 0.9 % injection 3 mL  3 mL Intravenous Q12H Tonny Bollman, MD       sodium chloride flush (NS) 0.9 % injection 3 mL  3 mL Intravenous PRN Tonny Bollman, MD        Medications Prior to Admission  Medication Sig Dispense Refill Last Dose   amLODipine (NORVASC) 5 MG tablet Take 1 tablet (5 mg total) by mouth daily. 90 tablet 3 10/04/2022   aspirin EC 81 MG tablet Take 1 tablet (81 mg total) by mouth daily. Swallow whole. 90 tablet 3 10/04/2022   carvedilol (COREG) 6.25 MG tablet TAKE 1 TABLET TWICE A DAY 180 tablet 1 10/04/2022 at 0900   colchicine 0.6 MG tablet Take 0.6 mg by mouth daily as needed (gout).    unknown   CRANBERRY PO Take 1 capsule by mouth daily.   10/04/2022   furosemide (LASIX) 20 MG tablet TAKE 1 TABLET BY MOUTH EVERY DAY AS NEEDED (Patient taking differently: Take 20 mg by mouth daily as needed for fluid or edema.) 90 tablet 3 unknown   ipratropium (ATROVENT) 0.06 % nasal spray Place 2 sprays into both nostrils 3 (three) times daily as needed for rhinitis.   unknown   metFORMIN (GLUCOPHAGE-XR) 750 MG 24 hr tablet Take 750 mg by mouth daily after breakfast.   10/04/2022  rosuvastatin (CRESTOR) 10 MG tablet TAKE 1 TABLET DAILY 90 tablet 3 10/04/2022   telmisartan (MICARDIS) 80 MG tablet Take 80 mg by mouth daily.   10/04/2022    Family History  Problem Relation Age of Onset   Heart disease Sister      Review of Systems:       Cardiac Review of Systems: Y or  [    ]= no  Chest Pain [  x  ]  Resting SOB [   ] Exertional SOB   [ x ]  Orthopnea [  ]   Pedal Edema [ x  ]    Palpitations [  ] Syncope  [  ]   Presyncope [   ]  General Review of Systems: [Y] = yes [  ]=no Constitional: recent weight change [  ]; anorexia [  ]; fatigue [  ]; nausea [  ]; night sweats [  ]; fever [  ]; or chills [  ]                                                               Dental: Last Dentist visit:   Eye : blurred vision [  ]; diplopia [   ]; vision changes [  ];  Amaurosis fugax[  ]; Resp: cough [  ];  wheezing[ x ];  hemoptysis[  ]; shortness of breath[ x ]; paroxysmal nocturnal dyspnea[  ]; dyspnea on exertion[x  ]; or orthopnea[  ];  GI:  gallstones[  ], vomiting[  ];  dysphagia[  ]; melena[  ];  hematochezia [  ]; heartburn[  ];   Hx of  Colonoscopy[  ]; GU: kidney stones [  ]; hematuria[  ];   dysuria [  ];  nocturia[  ];  history of     obstruction [  ]; urinary frequency [  ]             Skin: rash, swelling[  ];, hair loss[  ];  peripheral edema[  ];  or itching[  ]; Musculosketetal: myalgias[  ];  joint swelling[  ];  joint erythema[  ];  joint pain[  ];  back pain[  ];  Heme/Lymph: bruising[  ];  bleeding[  ];  anemia[  ];  Neuro: TIA[  ];  headaches[  ];  stroke[  ];  vertigo[  ];  seizures[  ];   paresthesias[  ];  difficulty walking[  ];  Psych:depression[  ]; anxiety[  ];  Endocrine: diabetes[ x ];  thyroid dysfunction[  ];             Physical Exam: BP 136/83   Pulse 66   Temp 98.3 F (36.8 C) (Oral)   Resp 17   Ht 5\' 10"  (1.778 m)   Wt 75.6 kg   SpO2 100%   BMI 23.91 kg/m    General appearance: alert, cooperative, and mild distress Head: Normocephalic, without obvious abnormality, atraumatic Neck: no adenopathy, no carotid bruit, no JVD, and supple, symmetrical, trachea midline Lymph nodes: no cervical or clavicular adenopathy.  Resp: few crackles and wheezes bilat. Cardio: RRR, monitor showing NSR.  Soft systolic murmur at LUSB. The IABP is counter pulsating at 1:1.  GI: Soft, active bowel sounds.   There is prominent  ulceration over the abdominal aorta.  No tenderness.  A incision in the right lower quadrant consistent with recent hernia repair. Extremities: All are warm and well-perfused.  The radial pulses are easily palpable.  The posterior tibial and dorsalis pedis pulses are present by Doppler.  There is 3-4+ pitting edema in both lower extremities from the knees to the ankles. Neurologic: Grossly normal  Diagnostic Studies & Laboratory data:   AORTIC ARCH ANGIOGRAPHY  IABP Insertion  RIGHT/LEFT HEART CATH AND CORONARY ANGIOGRAPHY   Conclusion  1.  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  Severe proximal and mid LAD stenoses of 90% 3.  Severe obtuse marginal stenosis of 95% 4.  Nondominant RCA, not visualized 5.  Severe segmental LV systolic dysfunction with LVEF estimated at 30% 6.  Mild aortic insufficiency assessed by supravalvular aortogram 7.  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  Successful intra-aortic balloon pump insertion   Recommendations: IV heparin, continue IABP 1:1, cardiac surgical consultation.  Difficult situation in this elderly patient, will need heart team approach to his care.   Indications  Non-ST elevation (NSTEMI) myocardial infarction (HCC) [I21.4 (ICD-10-CM)]  STEMI (ST elevation myocardial infarction) (HCC) [I21.3 (ICD-10-CM)]   Clinical Presentation  CHF/Shock Congestive heart failure present. NYHA Class IV. Cardiogenic shock present.  Coronary Findings  Diagnostic Dominance: Left Left Main  Mid LM to Dist LM lesion is 95% stenosed. The lesion is mildly calcified. There is a critical 95% mid to distal left main stenosis extending into an aneurysmal segment at the bifurcation of the LAD and left circumflex    Left Anterior Descending  Prox LAD lesion is 80% stenosed. The lesion is severely calcified.  Prox LAD to Mid LAD lesion is 90% stenosed. The lesion is severely calcified.    First  Diagonal Branch  1st Diag lesion is 50% stenosed.    Left Circumflex  Dist Cx lesion is 50% stenosed with 50% stenosed side branch in LPAV.    First Obtuse Marginal Branch  1st Mrg lesion is 95% stenosed.    Right Coronary Artery  The vessel is severely calcified. Suspected nondominant RCA, not selectively engaged    Intervention   No interventions have been documented.   Impella/IABP  Hemodynamic Support An IABP was inserted for hemodynamic support in the setting of cardiogenic shock. Access site:  right femoral artery.   Left Heart  Left Ventricle LV end diastolic pressure is moderately elevated.   Coronary Diagrams  Diagnostic Dominance: Left      Recent Radiology Findings:   DG Chest Portable 1 View  Result Date: 10/05/2022 CLINICAL DATA:  Altered mental status. EXAM: PORTABLE CHEST 1 VIEW COMPARISON:  Chest radiograph dated 02/06/2022 and CT dated 04/06/2022. FINDINGS: There is diffuse interstitial coarsening and nodularity involving the mid to lower lung field bilaterally. Probable small bilateral pleural effusions and bibasilar atelectasis or infiltrate. No pneumothorax. Top-normal cardiac size. Atherosclerotic calcification of the aorta. No acute osseous pathology. IMPRESSION: 1. Small bilateral pleural effusions and bibasilar atelectasis or infiltrate. 2. Diffuse interstitial coarsening and nodularity. Electronically Signed   By: Elgie Collard M.D.   On: 10/05/2022 02:08     I have independently reviewed the above radiologic studies and discussed with the patient   Recent Lab Findings: Lab Results  Component Value Date   WBC 6.8 10/05/2022   HGB 10.9 (L) 10/05/2022   HCT 32.0 (L) 10/05/2022   PLT 208 10/05/2022   GLUCOSE 150 (H) 10/05/2022   CHOL 93 (  L) 07/15/2021   TRIG 70 07/15/2021   HDL 46 07/15/2021   LDLCALC 32 07/15/2021   ALT 18 02/06/2022   AST 26 02/06/2022   NA 140 10/05/2022   K 4.0 10/05/2022   CL 103 10/05/2022   CREATININE 1.21  10/05/2022   BUN 16 10/05/2022   CO2 25 10/05/2022   TSH 1.350 10/14/2021   INR 1.21 04/09/2017   HGBA1C 5.3 07/29/2022      Assessment / Plan:      -Critical left main stenosis and severe multivessel coronary artery disease and an 82 year old gentleman presenting with acute non-ST elevation myocardial infarction, he is stable with heparin infusing and an intra-aortic balloon pump in place reporting no chest pain or shortness of breath. [name] Steele has been living a fairly active lifestyle with daily walks and gardening as recently as 3 days ago.  I am not able to review the coronary angiograms.   He appears to be a potential candidate for bypass grafting.  He would need heart failure and pulmonary status optimized before proceeding with surgery.  The procedure and expected post-operative recovery were explained to [name] Steele and his wife.  At this point, he is not sure he would want to follow through with it even if offered. He wants to think it over and discuss with his wife.   -History of Interstitial lung disease--This was noted on chart review.  Neither [name] Steele nor his wife were aware of this diagnosis.  Pulmonary function testing from 6 months ago were reviewed and FEV1 ov 1.76 and FVC of 2.6L.  CXR from 6 months ago was also unremarkable.  He has not smoked since 1990.   -Type 2 DM- managed with metformin prior to admission.  HA1C in march of this year was 5.3.  -History of HTN- on telmisartan, amlodipine, and carvedilol prior to admission.   -Stage III CKD- serum creat has ranged from ~1.0 to 1.25 for the past 12 months.   -Dr. Cliffton Asters will review and discuss with cardiology. Further recommendations will follow.   I  spent 30 minutes counseling the patient face to face.   Leary Roca, PA-C  10/05/2022 10:30 AM  Agree with above 82yo male who presents with NSTEMI.  He has LM/3V disease, and an EF 25%.  We discussed the risks and benefits for surgical bypass.  He and  his wife are concerned that he would not recover well from surgery.  They would like to proceed with medical management.  [name] Steele

## 2022-10-06 ENCOUNTER — Telehealth (HOSPITAL_COMMUNITY): Payer: Self-pay | Admitting: Pharmacy Technician

## 2022-10-06 ENCOUNTER — Inpatient Hospital Stay (HOSPITAL_COMMUNITY): Payer: Medicare Other

## 2022-10-06 ENCOUNTER — Other Ambulatory Visit (HOSPITAL_COMMUNITY): Payer: Self-pay

## 2022-10-06 ENCOUNTER — Encounter (HOSPITAL_COMMUNITY): Payer: Self-pay | Admitting: Cardiovascular Disease

## 2022-10-06 DIAGNOSIS — I214 Non-ST elevation (NSTEMI) myocardial infarction: Secondary | ICD-10-CM

## 2022-10-06 LAB — CBC
HCT: 34.3 % — ABNORMAL LOW (ref 39.0–52.0)
Hemoglobin: 10.8 g/dL — ABNORMAL LOW (ref 13.0–17.0)
MCH: 31.5 pg (ref 26.0–34.0)
MCHC: 31.5 g/dL (ref 30.0–36.0)
MCV: 100 fL (ref 80.0–100.0)
Platelets: 189 10*3/uL (ref 150–400)
RBC: 3.43 MIL/uL — ABNORMAL LOW (ref 4.22–5.81)
RDW: 14.5 % (ref 11.5–15.5)
WBC: 4.9 10*3/uL (ref 4.0–10.5)
nRBC: 0 % (ref 0.0–0.2)

## 2022-10-06 LAB — GLUCOSE, CAPILLARY
Glucose-Capillary: 82 mg/dL (ref 70–99)
Glucose-Capillary: 88 mg/dL (ref 70–99)
Glucose-Capillary: 106 mg/dL — ABNORMAL HIGH (ref 70–99)
Glucose-Capillary: 96 mg/dL (ref 70–99)

## 2022-10-06 LAB — BASIC METABOLIC PANEL
Anion gap: 9 (ref 5–15)
BUN: 15 mg/dL (ref 8–23)
CO2: 26 mmol/L (ref 22–32)
Calcium: 8.5 mg/dL — ABNORMAL LOW (ref 8.9–10.3)
Chloride: 103 mmol/L (ref 98–111)
Creatinine, Ser: 0.98 mg/dL (ref 0.61–1.24)
GFR, Estimated: >60 mL/min (ref >60)
Glucose, Bld: 106 mg/dL — ABNORMAL HIGH (ref 70–99)
Potassium: 3.8 mmol/L (ref 3.5–5.1)
Sodium: 138 mmol/L (ref 135–145)

## 2022-10-06 LAB — HEPARIN LEVEL (UNFRACTIONATED): Heparin Unfractionated: 0.14 IU/mL — ABNORMAL LOW (ref 0.30–0.70)

## 2022-10-06 LAB — POCT ACTIVATED CLOTTING TIME: Activated Clotting Time: 136 seconds

## 2022-10-06 MED ORDER — CLOPIDOGREL BISULFATE 75 MG PO TABS
75.0000 mg | ORAL_TABLET | Freq: Every day | ORAL | Status: DC
  Administered 2022-10-07 – 2022-10-10 (×4): 75 mg via ORAL
  Filled 2022-10-06 (×4): qty 1

## 2022-10-06 MED ORDER — CLOPIDOGREL BISULFATE 300 MG PO TABS
300.0000 mg | ORAL_TABLET | Freq: Once | ORAL | Status: AC
  Administered 2022-10-06: 300 mg via ORAL
  Filled 2022-10-06: qty 1

## 2022-10-06 NOTE — Telephone Encounter (Signed)
Pharmacy Patient Advocate Encounter  Insurance verification completed.    The patient is insured through [place] of [employer]   The patient is currently admitted and ran test claims for the following: clopidogrel (Plavix).  Copays and coinsurance results were relayed to Inpatient clinical team.

## 2022-10-06 NOTE — Progress Notes (Signed)
ANTICOAGULATION CONSULT NOTE Pharmacy Consult for IV Heparin Indication: STEMI/IABP  Allergies  Allergen Reactions   Erythromycin Other (See Comments)    Severe Abdominal Pain   Penicillin G Other (See Comments)   Penicillins Hives    Has patient had a PCN reaction causing immediate rash, facial/tongue/throat swelling, SOB or lightheadedness with hypotension:  Yes  Has patient had a PCN reaction causing severe rash involving mucus membranes or skin necrosis:  No Has patient had a PCN reaction that required hospitalization: No Has patient had a PCN reaction occurring within the last 10 years: No If all of the above answers are "NO", then may proceed with Cephalosporin use. Tolerated CTX 11/23 admit     Patient Measurements: Height: 5\' 10"  (177.8 cm) Weight: 75.6 kg (166 lb 10.7 oz) IBW/kg (Calculated) : 73 Heparin Dosing Weight: 61 kg  Vital Signs: Temp: 98.3 F (36.8 C) (05/20 2300) Temp Source: Oral (05/20 2300) BP: 147/78 (05/21 0300) Pulse Rate: 73 (05/21 0300)  Labs: Recent Labs    10/05/22 0130 10/05/22 0248 10/05/22 0320 10/05/22 0543 10/05/22 0544 10/05/22 1059 10/05/22 1640 10/06/22 0302  HGB 11.3*  --   --  10.9* 10.9*  --   --  10.8*  HCT 36.3*  --   --  32.0* 32.0*  --   --  34.3*  PLT 208  --   --   --   --   --   --  189  HEPARINUNFRC  --   --    < >  --   --  0.43 0.17* 0.14*  CREATININE 1.21  --   --   --   --   --   --  0.98  TROPONINIHS 2,206* 2,258*  --   --   --   --   --   --    < > = values in this interval not displayed.     Estimated Creatinine Clearance: 60 mL/min (by C-G formula based on SCr of 0.98 mg/dL).  Assessment: 82 y.o. male admitted with STEMI s/p cath and IABP for heparin  Goal of Therapy:  Heparin level 0.2 to 0.5 units/ml Monitor platelets by anticoagulation protocol: Yes   Plan:  Increase Heparin 800 units/hr Check heparin level in 6 hours.   Geannie Risen, PharmD, BCPS  10/06/2022,3:48 AM

## 2022-10-06 NOTE — TOC Initial Note (Addendum)
Transition of Care [facility]) - Initial/Assessment Note    Patient Details  Name: [name] Steele MRN: [id number] Date of Birth: 1941/05/16  Transition of Care [facility]) CM/SW Contact:    Elliot Cousin, RN Phone Number: [phone] 10/06/2022, 3:52 PM  Clinical Narrative:    Spoke to pt, wife and son at bedside. Pt wants to go home. Wife understands he will need assistance in the home. She is agreeable to Home Health PT, aide and RN. Wife states she worked for [employer] and understands their philosophy. Provided pt with list of medicare.gov HH agency with ratings to review. States pt will need RW with seat and bedside commode. Son states pt and wife will need assistance in the home. Discussed private duty care and custodial care is out of pocket. States he and his wife works full-time.   Expected Discharge Plan: Home w Home Health Services Barriers to Discharge: Continued Medical Work up   Patient Goals and CMS Choice Patient states their goals for this hospitalization and ongoing recovery are:: wants to go home CMS Medicare.gov Compare Post Acute Care list provided to:: Patient Represenative (must comment) (wife- Renard Matter) Choice offered to / list presented to : Spouse      Expected Discharge Plan and Services   Discharge Planning Services: CM Consult Post Acute Care Choice: Home Health Living arrangements for the past 2 months: Single Family Home                                      Prior Living Arrangements/Services Living arrangements for the past 2 months: Single Family Home Lives with:: Spouse Patient language and need for interpreter reviewed:: Yes Do you feel safe going back to the place where you live?: Yes      Need for Family Participation in Patient Care: Yes (Comment) Care giver support system in place?: Yes (comment)   Criminal Activity/Legal Involvement Pertinent to Current Situation/Hospitalization: No - Comment as needed  Activities of Daily Living       Permission Sought/Granted Permission sought to share information with : Case Manager, Family Supports Permission granted to share information with : Yes, Verbal Permission Granted  Share Information with NAME: Renard Matter, Tawan Tabet     Permission granted to share info w Relationship: wife, son  Permission granted to share info w Contact Information: [phone]  Emotional Assessment Appearance:: Appears stated age Attitude/Demeanor/Rapport: Lethargic          Admission diagnosis:  STEMI (ST elevation myocardial infarction) (HCC) [I21.3] NSTEMI (non-ST elevated myocardial infarction) (HCC) [I21.4] Heart failure, unspecified HF chronicity, unspecified heart failure type [facility]) [I50.9] Patient Active Problem List   Diagnosis Date Noted   NSTEMI (non-ST elevated myocardial infarction) (HCC) 10/05/2022   Near syncope 02/06/2022   E. coli UTI 02/06/2022   Infrarenal abdominal aortic aneurysm (AAA) without rupture (HCC) 02/06/2022   Hypokalemia 02/06/2022   Normocytic anemia 02/06/2022   Coronary artery disease involving native coronary artery of native heart with angina pectoris (HCC) 04/14/2021   Heart murmur 04/14/2021   Nonspecific abnormal electrocardiogram (ECG) (EKG) 04/14/2021   Lower extremity edema 04/14/2021   Acute cholecystitis 07/04/2018   E coli bacteremia 04/11/2017   UTI (urinary tract infection) 04/10/2017   Sepsis (HCC) 04/10/2017   Type 2 diabetes mellitus without complication (HCC) 04/10/2017   Essential hypertension 04/10/2017   PCP:  [name], MD Pharmacy:   [id number] [place],  [place] - [address] [place] [address] Phone: [phone] Fax: [phone]  [facility] [place], [place] - [address] [facility] Portal to Registered [address] [place] [address] Phone: [phone] Fax: [phone]  [facility] [address] [place] [address] Phone: [phone] Fax: [phone]     Social Determinants of Health (SDOH) Social History: SDOH Screenings   Food Insecurity: No Food Insecurity (02/06/2022)  Housing: Low Risk  (02/06/2022)  Transportation Needs: No Transportation Needs (02/06/2022)  Utilities: Not At Risk (02/06/2022)  Tobacco Use: Medium Risk (10/06/2022)   SDOH Interventions:     Readmission Risk Interventions     No data to display

## 2022-10-06 NOTE — TOC Benefit Eligibility Note (Signed)
Patient Product/process development scientist completed.    The patient is currently admitted and upon discharge could be taking clopidogrel (Plavix) 75 mg tablets.  The current 30 day co-pay is $10.00.   The patient is insured through [employer]  This test claim was processed through [facility]- copay amounts may vary at other pharmacies due to pharmacy/plan contracts, or as the patient moves through the different stages of their insurance plan.  Roland Earl, CPHT Pharmacy Patient Advocate Specialist [facility] Patient Advocate Team Direct Number: [phone]  Fax: [phone]

## 2022-10-06 NOTE — Progress Notes (Signed)
3fr venous sheath aspirated and removed from right femoral vein. Manual pressure applied for 5 minutes.  IABP catheter removed from right femoral artery. Manual pressure applied to both sites for 40 minutes. Site level 0 No s+s of hematoma. Tegaderm dressing applied, bedrest instructions given.  Right distal pulse present with doppler, PT pulse present with doppler.      Bedrest begins at 12:50:00

## 2022-10-06 NOTE — Progress Notes (Signed)
   Heart Failure Stewardship Pharmacist Progress Note   PCP: [name], MD PCP-Cardiologist: Donato Schultz, MD    HPI:  82 yo M with PMH of CKD, T2DM, mild AS, and HTN.  Presented to the ED on 5/20 with shortness of breath and chest pain. EKG showed diffuse ST depressions with AVR elevation. Troponin >2K, BNP elevated, hypotensive, LE edema. States he is compliant with his medications. Had a positive stress test in 06/2022 and CAC was 6000. Taken to the cath lab and found 95% stenosis of left main, 90% stenosis of prox and mid LAD and 95% stenosis of obtuse marginal. LVEF ~30% by LV gram. IABP was placed for hemodynamic support. CT surgery consulted for possible CABG. Formal ECHO 5/20 showed LVEF 20-25%, global hypokinesis, G2DD, RV normal, elevated PA pressure, trivial MR, mild AR, mild AS. Ultimately, joint decision was made to not pursue surgical revascularization. Plan for IABP wean and removal.   Current HF Medications: Diuretic: furosemide 40 mg PO BID  Prior to admission HF Medications: Diuretic: furosemide 20 mg daily PRN Beta blocker: carvedilol 6.25 mg BID ACE/ARB/ARNI: telmisartan 80 mg daily  Pertinent Lab Values: Serum creatinine 0.98, BUN 15, Potassium 3.8, Sodium 138, BNP 1750  Vital Signs: Weight: 166 lbs (admission weight: 166 lbs) Blood pressure: 90/50-150/90s Heart rate: 70s  I/O: -1.4L yesterday; net -1.7L  Medication Assistance / Insurance Benefits Check: Does the patient have prescription insurance?  Yes Type of insurance plan: Medicare  Does the patient qualify for medication assistance through manufacturers or grants?   Pending Eligible grants and/or patient assistance programs: pending Medication assistance applications in progress: none  Medication assistance applications approved: none Approved medication assistance renewals will be completed by: pending  Outpatient Pharmacy[facility]  [facility] TOC pharmacy at discharge? Yes Is the patient willing to transition their outpatient pharmacy to utilize a [facility] outpatient pharmacy?   Pending    Assessment: 1. 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 NYHA class II symptoms. - Continue furosemide 40 mg PO BID. Strict I/Os and daily weights. Keep K>4 and check magnesium - Consider restarting PTA carvedilol and adding further GDMT pending hemodynamic stability off IABP   Plan: 1) Medication changes recommended at this time: - Continue current regimen; check magnesium in AM  2) Patient assistance: Sherryll Burger copay $168 Marcelline Deist copay $17.97 - Jardiance copay $150.05  3)  Education  - To be completed prior to discharge  Sharen Hones, PharmD, BCPS Heart Failure Stewardship Pharmacist Phone [phone]

## 2022-10-06 NOTE — Progress Notes (Addendum)
Rounding Note    Patient Name: [name] Steele Date of Encounter: 10/06/2022  [facility] Cardiologist: Donato Schultz, MD   Subjective   No chest pain or shortness of breath.  On intra-aortic balloon pump at one-to-one, hemodynamically stable.  Inpatient Medications    Scheduled Meds:  aspirin EC  81 mg Oral Daily   atorvastatin  80 mg Oral Daily   Chlorhexidine Gluconate Cloth  6 each Topical Daily   furosemide  40 mg Oral BID   insulin aspart  0-15 Units Subcutaneous TID WC   sodium chloride flush  10-40 mL Intracatheter Q12H   sodium chloride flush  3 mL Intravenous Q12H   Continuous Infusions:  sodium chloride     sodium chloride 10 mL/hr at 10/06/22 0700   PRN Meds: sodium chloride, sodium chloride, acetaminophen, diazepam, ipratropium, morphine injection, ondansetron (ZOFRAN) IV, mouth rinse, sodium chloride flush, sodium chloride flush   Vital Signs    Vitals:   10/06/22 0702 10/06/22 0715 10/06/22 0729 10/06/22 0730  BP: (!) 171/98     Pulse: 84 88 71 71  Resp: (!) 21 17 12 18   Temp:      TempSrc:      SpO2: 97% 95% 97% 98%  Weight:      Height:        Intake/Output Summary (Last 24 hours) at 10/06/2022 0821 Last data filed at 10/06/2022 0700 Gross per 24 hour  Intake 407.17 ml  Output 2000 ml  Net -1592.83 ml      10/05/2022    7:00 AM 10/05/2022   12:33 AM 07/31/2022   10:44 AM  Last 3 Weights  Weight (lbs) 166 lb 10.7 oz 135 lb 147 lb 11.3 oz  Weight (kg) 75.6 kg 61.236 kg 67 kg      Telemetry    Sinus rhythm- Personally Reviewed  ECG    Sinus rhythm at 72 with anterolateral ST segment depression- Personally Reviewed  Physical Exam   GEN: No acute distress.   Neck: No JVD Cardiac: RRR, no murmurs, rubs, or gallops.  Respiratory: Clear to auscultation bilaterally. GI: Soft, nontender, non-distended  MS: No edema; No deformity. Neuro:  Nonfocal  Psych: Normal affect  Peripheral vascular: Dopplerable pedal pulses  bilaterally  Labs    High Sensitivity Troponin:   Recent Labs  Lab 10/05/22 0130 10/05/22 0248  TROPONINIHS 2,206* 2,258*     Chemistry Recent Labs  Lab 10/05/22 0130 10/05/22 0543 10/05/22 0544 10/06/22 0302  NA 138 140 141 138  K 3.9 4.0 4.0 3.8  CL 103  --   --  103  CO2 25  --   --  26  GLUCOSE 150*  --   --  106*  BUN 16  --   --  15  CREATININE 1.21  --   --  0.98  CALCIUM 8.6*  --   --  8.5*  GFRNONAA 60*  --   --  >60  ANIONGAP 10  --   --  9    Lipids No results for input(s): "CHOL", "TRIG", "HDL", "LABVLDL", "LDLCALC", "CHOLHDL" in the last 168 hours.  Hematology Recent Labs  Lab 10/05/22 0130 10/05/22 0543 10/05/22 0544 10/06/22 0302  WBC 6.8  --   --  4.9  RBC 3.56*  --   --  3.43*  HGB 11.3* 10.9* 10.9* 10.8*  HCT 36.3* 32.0* 32.0* 34.3*  MCV 102.0*  --   --  100.0  MCH 31.7  --   --  31.5  MCHC 31.1  --   --  31.5  RDW 14.6  --   --  14.5  PLT 208  --   --  189   Thyroid No results for input(s): "TSH", "FREET4" in the last 168 hours.  BNP Recent Labs  Lab 10/05/22 0130  BNP 1,750.0*    DDimer No results for input(s): "DDIMER" in the last 168 hours.   Radiology    ECHOCARDIOGRAM COMPLETE  Result Date: 10/05/2022    ECHOCARDIOGRAM REPORT   Patient Name:   [name] Steele Date of Exam: 10/05/2022 Medical Rec #:  [id number]        Height:       70.0 in Accession #:    [id number]       Weight:       166.7 lb Date of Birth:  [birth year]         BSA:          1.932 m Patient Age:    82 years         BP:           133/71 mmHg Patient Gender: M                HR:           63 bpm. Exam Location:  Inpatient Procedure: 2D Echo, Color Doppler, Cardiac Doppler and Intracardiac            Opacification Agent Indications:    R94.31 Abnormal EKG  History:        Patient has prior history of Echocardiogram examinations, most                 recent 02/07/2022. CAD; Risk Factors:Hypertension and Diabetes.  Sonographer:    Irving Burton Senior RDCS Referring Phys: [phone]  MICHAEL COOPER  Sonographer Comments: Scanned supine on balloon pump IMPRESSIONS  1. Presence of apical swirling/sludge with definity contrast without evidence of formed thrombus. Left ventricular ejection fraction, by estimation, is 20 to 25%. The left ventricle has severely decreased function. The left ventricle demonstrates global  hypokinesis. Left ventricular diastolic parameters are consistent with Grade II diastolic dysfunction (pseudonormalization).  2. Right ventricular systolic function is normal. The right ventricular size is normal. There is moderately elevated pulmonary artery systolic pressure. The estimated right ventricular systolic pressure is 59.1 mmHg.  3. The mitral valve is grossly normal. Trivial mitral valve regurgitation.  4. Tricuspid valve regurgitation is mild to moderate.  5. The aortic valve is calcified. There is moderate calcification of the aortic valve. There is moderate thickening of the aortic valve. Aortic valve regurgitation is mild. Mild aortic valve stenosis. Aortic valve area, by VTI measures 1.11 cm. Aortic valve mean gradient measures 10.0 mmHg. Aortic valve Vmax measures 2.06 m/s.  6. The inferior vena cava is dilated in size with <50% respiratory variability, suggesting right atrial pressure of 15 mmHg. Comparison(s): Changes from prior study are noted. Conclusion(s)/Recommendation(s): Severe LV dysfunction, with severe hypokinesis to akinesis worst at the apex, present throughout LAD/LCx territory. Basal to mid inferior/inferolateral walls with preserved function. FINDINGS  Left Ventricle: Presence of apical swirling/sludge with definity contrast without evidence of formed thrombus. Left ventricular ejection fraction, by estimation, is 20 to 25%. The left ventricle has severely decreased function. The left ventricle demonstrates global hypokinesis. Definity contrast agent was given IV to delineate the left ventricular endocardial borders. The left ventricular internal  cavity size was normal in size. There is no left ventricular hypertrophy. Left ventricular  diastolic parameters are consistent with Grade II diastolic dysfunction (pseudonormalization).  LV Wall Scoring: The apical lateral segment, apical septal segment, apical anterior segment, and apex are akinetic. The anterior wall, antero-lateral wall, anterior septum, mid inferoseptal segment, apical inferior segment, and basal inferoseptal segment are hypokinetic. The inferior wall and posterior wall are normal. Right Ventricle: The right ventricular size is normal. No increase in right ventricular wall thickness. Right ventricular systolic function is normal. There is moderately elevated pulmonary artery systolic pressure. The tricuspid regurgitant velocity is 3.32 m/s, and with an assumed right atrial pressure of 15 mmHg, the estimated right ventricular systolic pressure is 59.1 mmHg. Left Atrium: Left atrial size was normal in size. Right Atrium: Right atrial size was normal in size. Pericardium: Trivial pericardial effusion is present. Mitral Valve: The mitral valve is grossly normal. Trivial mitral valve regurgitation. Tricuspid Valve: The tricuspid valve is grossly normal. Tricuspid valve regurgitation is mild to moderate. Aortic Valve: The aortic valve is calcified. There is moderate calcification of the aortic valve. There is moderate thickening of the aortic valve. Aortic valve regurgitation is mild. Mild aortic stenosis is present. Aortic valve mean gradient measures 10.0 mmHg. Aortic valve peak gradient measures 17.0 mmHg. Aortic valve area, by VTI measures 1.11 cm. Pulmonic Valve: The pulmonic valve was not well visualized. Pulmonic valve regurgitation is trivial. Aorta: The ascending aorta was not well visualized and the aortic root is normal in size and structure. Venous: The inferior vena cava is dilated in size with less than 50% respiratory variability, suggesting right atrial pressure of 15 mmHg.  IAS/Shunts: The atrial septum is grossly normal. Additional Comments: There is pleural effusion in the left lateral region.  LEFT VENTRICLE PLAX 2D LVIDd:         5.80 cm      Diastology LVIDs:         5.40 cm      LV e' lateral:   9.14 cm/s LV PW:         0.70 cm      LV E/e' lateral: 8.6 LV IVS:        0.60 cm LVOT diam:     1.90 cm LV SV:         40 LV SV Index:   21 LVOT Area:     2.84 cm  LV Volumes (MOD) LV vol d, MOD A2C: 102.0 ml LV vol d, MOD A4C: 106.0 ml LV vol s, MOD A2C: 76.3 ml LV vol s, MOD A4C: 75.7 ml LV SV MOD A2C:     25.7 ml LV SV MOD A4C:     106.0 ml LV SV MOD BP:      27.5 ml RIGHT VENTRICLE RV S prime:     18.60 cm/s TAPSE (M-mode): 1.7 cm LEFT ATRIUM             Index        RIGHT ATRIUM           Index LA diam:        3.60 cm 1.86 cm/m   RA Area:     14.40 cm LA Vol (A2C):   48.4 ml 25.03 ml/m  RA Volume:   34.20 ml  17.70 ml/m LA Vol (A4C):   63.2 ml 32.73 ml/m LA Biplane Vol: 48.4 ml 25.06 ml/m  AORTIC VALVE AV Area (Vmax):    1.18 cm AV Area (Vmean):   1.32 cm AV Area (VTI):     1.11 cm AV Vmax:  206.33 cm/s AV Vmean:          146.667 cm/s AV VTI:            0.356 m AV Peak Grad:      17.0 mmHg AV Mean Grad:      10.0 mmHg LVOT Vmax:         86.20 cm/s LVOT Vmean:        68.400 cm/s LVOT VTI:          0.140 m LVOT/AV VTI ratio: 0.39  AORTA Ao Root diam: 3.40 cm MITRAL VALVE               TRICUSPID VALVE MV Area (PHT): 3.72 cm    TR Peak grad:   44.1 mmHg MV Decel Time: 204 msec    TR Vmax:        332.00 cm/s MV E velocity: 78.60 cm/s MV A velocity: 50.80 cm/s  SHUNTS MV E/A ratio:  1.55        Systemic VTI:  0.14 m                            Systemic Diam: 1.90 cm Jodelle Red MD Electronically signed by Jodelle Red MD Signature Date/Time: 10/05/2022/12:11:01 PM    Final    DG CHEST PORT 1 VIEW  Result Date: 10/05/2022 CLINICAL DATA:  Management of intra-aortic balloon pump. EXAM: PORTABLE CHEST 1 VIEW COMPARISON:  10/05/2022 FINDINGS: Heart size  is normal accounting for patient position. Intra-aortic balloon pump has been placed, with radiopaque marker approximately 4.8 centimeters below the aortic arch. Position of the radiopaque marker is slightly LATERAL, consistent with tortuous aorta. There are small bilateral pleural effusions. Diffuse bilateral interstitial prominence is consistent with mild edema. No new consolidations. IMPRESSION: 1. Interval placement of intra-aortic balloon pump. 2. Small bilateral pleural effusions and mild interstitial edema. These results were called by telephone at the time of interpretation on 10/05/2022 at 10:55 am to provider [facility] , who verbally acknowledged these results. Electronically Signed   By: Norva Pavlov M.D.   On: 10/05/2022 10:55   CARDIAC CATHETERIZATION  Result Date: 10/05/2022 1.  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  Severe proximal and mid LAD stenoses of 90% 3.  Severe obtuse marginal stenosis of 95% 4.  Nondominant RCA, not visualized 5.  Severe segmental LV systolic dysfunction with LVEF estimated at 30% 6.  Mild aortic insufficiency assessed by supravalvular aortogram 7.  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  Successful intra-aortic balloon pump insertion Recommendations: IV heparin, continue IABP 1:1, cardiac surgical consultation.  Difficult situation in this elderly patient, will need heart team approach to his care.   PERIPHERAL VASCULAR CATHETERIZATION  Result Date: 10/05/2022 1.  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  Severe proximal and mid LAD stenoses of 90% 3.  Severe obtuse marginal stenosis of 95% 4.  Nondominant RCA, not visualized 5.  Severe segmental LV systolic dysfunction with LVEF estimated at 30% 6.  Mild aortic insufficiency assessed by supravalvular aortogram 7.  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  Successful intra-aortic balloon pump insertion Recommendations: IV  heparin, continue IABP 1:1, cardiac surgical consultation.  Difficult situation in this elderly patient, will need heart team approach to his care.   DG Chest Portable 1 View  Result Date: 10/05/2022 CLINICAL DATA:  Altered mental status. EXAM: PORTABLE CHEST 1 VIEW COMPARISON:  Chest  radiograph dated 02/06/2022 and CT dated 04/06/2022. FINDINGS: There is diffuse interstitial coarsening and nodularity involving the mid to lower lung field bilaterally. Probable small bilateral pleural effusions and bibasilar atelectasis or infiltrate. No pneumothorax. Top-normal cardiac size. Atherosclerotic calcification of the aorta. No acute osseous pathology. IMPRESSION: 1. Small bilateral pleural effusions and bibasilar atelectasis or infiltrate. 2. Diffuse interstitial coarsening and nodularity. Electronically Signed   By: Elgie Collard M.D.   On: 10/05/2022 02:08    Cardiac Studies   2D echo (10/05/2022)  IMPRESSIONS     1. Presence of apical swirling/sludge with definity contrast without  evidence of formed thrombus. Left ventricular ejection fraction, by  estimation, is 20 to 25%. The left ventricle has severely decreased  function. The left ventricle demonstrates global   hypokinesis. Left ventricular diastolic parameters are consistent with  Grade II diastolic dysfunction (pseudonormalization).   2. Right ventricular systolic function is normal. The right ventricular  size is normal. There is moderately elevated pulmonary artery systolic  pressure. The estimated right ventricular systolic pressure is 59.1 mmHg.   3. The mitral valve is grossly normal. Trivial mitral valve  regurgitation.   4. Tricuspid valve regurgitation is mild to moderate.   5. The aortic valve is calcified. There is moderate calcification of the  aortic valve. There is moderate thickening of the aortic valve. Aortic  valve regurgitation is mild. Mild aortic valve stenosis. Aortic valve  area, by VTI measures 1.11 cm.  Aortic  valve mean gradient measures 10.0 mmHg. Aortic valve Vmax measures 2.06  m/s.   6. The inferior vena cava is dilated in size with <50% respiratory  variability, suggesting right atrial pressure of 15 mmHg.    Cardiac catheterization (10/05/2022)  Conclusion  1.  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  Severe proximal and mid LAD stenoses of 90% 3.  Severe obtuse marginal stenosis of 95% 4.  Nondominant RCA, not visualized 5.  Severe segmental LV systolic dysfunction with LVEF estimated at 30% 6.  Mild aortic insufficiency assessed by supravalvular aortogram 7.  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  Successful intra-aortic balloon pump insertion   Recommendations: IV heparin, continue IABP 1:1, cardiac surgical consultation.  Difficult situation in this elderly patient, will need heart team approach to his care.  Diagnostic Dominance: Left  Intervention   Patient Profile     [name] Steele is a 82 y.o. male with pmh sx for CKD, DM, HTN, Mild AS, Mild dilatation of ascending aorta and CAC of 600 on CT scan  who is being seen 10/05/2022 for the evaluation of NSTEMI.   Assessment & Plan    1: CAD/ischemic cardiomyopathy-patient presented with chest pain with diffuse ST segment depression and elevation in aVR.  Troponins rose to 6000.  He underwent urgent cardiac catheterization by Dr. Excell Seltzer revealing left main disease and a left dominant system.  He had surgical anatomy.  An intra-aortic balloon pump was placed because of hemodynamic instability which resulted in stable hemodynamics.  The patient was evaluated by Dr. Cliffton Asters, cardiothoracic surgery, and the joint decision was that he was not a surgical candidate and did not want to pursue surgical revascularization.  Based on this, I am going to wean off and remove his intra-aortic balloon pump.  He wishes to be a DNR.  Heparin will be discontinued as well.  1 once his intra-aortic  balloon pump is removed we will transfer him to an on telemetry bed with anticipation of discharge in  next 24 to 48 hours.  In light of this decision, we will load him with antiplatelet agents.  For questions or updates, please contact [facility] Please consult [email or web address] for contact info under        Signed, Nanetta Batty, MD  10/06/2022, 8:21 AM    Addendum: Patient currently being weaned down on his intra-aortic balloon pump to 1-3.  His blood pressure has sequentially come down to the 90/60 range.  Heparin has been turned off.  I had a long discussion with the patient and his wife again at the bedside and they reiterate that he does not want surgical revascularization and they wish him to be a DNR.  I have written a DNR order.  The patient's nurse, Eulah Pont, was present during the discussion.  The intra-aortic balloon pump will be pulled by the Cath Lab later this morning.  Runell Gess, M.D., FACP, [facility], Earl Lagos [facility] [address] [place], [place]  [address]  [phone] 10/06/2022 11:05 AM

## 2022-10-07 ENCOUNTER — Encounter (HOSPITAL_COMMUNITY): Payer: Self-pay | Admitting: Cardiovascular Disease

## 2022-10-07 DIAGNOSIS — I214 Non-ST elevation (NSTEMI) myocardial infarction: Secondary | ICD-10-CM | POA: Diagnosis not present

## 2022-10-07 LAB — BASIC METABOLIC PANEL
Anion gap: 8 (ref 5–15)
BUN: 14 mg/dL (ref 8–23)
CO2: 27 mmol/L (ref 22–32)
Calcium: 8.3 mg/dL — ABNORMAL LOW (ref 8.9–10.3)
Chloride: 103 mmol/L (ref 98–111)
Creatinine, Ser: 1 mg/dL (ref 0.61–1.24)
GFR, Estimated: >60 mL/min (ref >60)
Glucose, Bld: 98 mg/dL (ref 70–99)
Potassium: 3.8 mmol/L (ref 3.5–5.1)
Sodium: 138 mmol/L (ref 135–145)

## 2022-10-07 LAB — CBC
HCT: 33.3 % — ABNORMAL LOW (ref 39.0–52.0)
Hemoglobin: 10.6 g/dL — ABNORMAL LOW (ref 13.0–17.0)
MCH: 30.9 pg (ref 26.0–34.0)
MCHC: 31.8 g/dL (ref 30.0–36.0)
MCV: 97.1 fL (ref 80.0–100.0)
Platelets: 191 10*3/uL (ref 150–400)
RBC: 3.43 MIL/uL — ABNORMAL LOW (ref 4.22–5.81)
RDW: 14.3 % (ref 11.5–15.5)
WBC: 5.1 10*3/uL (ref 4.0–10.5)
nRBC: 0 % (ref 0.0–0.2)

## 2022-10-07 LAB — GLUCOSE, CAPILLARY
Glucose-Capillary: 86 mg/dL (ref 70–99)
Glucose-Capillary: 101 mg/dL — ABNORMAL HIGH (ref 70–99)
Glucose-Capillary: 112 mg/dL — ABNORMAL HIGH (ref 70–99)

## 2022-10-07 LAB — LIPOPROTEIN A (LPA): Lipoprotein (a): 34.8 nmol/L — ABNORMAL HIGH (ref <75.0)

## 2022-10-07 LAB — MAGNESIUM: Magnesium: 1.8 mg/dL (ref 1.7–2.4)

## 2022-10-07 MED ORDER — MAGNESIUM SULFATE 2 GM/50ML IV SOLN
2.0000 g | Freq: Once | INTRAVENOUS | Status: AC
  Administered 2022-10-07: 2 g via INTRAVENOUS
  Filled 2022-10-07: qty 50

## 2022-10-07 MED ORDER — POTASSIUM CHLORIDE CRYS ER 20 MEQ PO TBCR
20.0000 meq | EXTENDED_RELEASE_TABLET | Freq: Once | ORAL | Status: AC
  Administered 2022-10-07: 20 meq via ORAL
  Filled 2022-10-07: qty 1

## 2022-10-07 MED ORDER — ENOXAPARIN SODIUM 40 MG/0.4ML IJ SOSY
40.0000 mg | PREFILLED_SYRINGE | INTRAMUSCULAR | Status: DC
  Administered 2022-10-07 – 2022-10-09 (×3): 40 mg via SUBCUTANEOUS
  Filled 2022-10-07 (×3): qty 0.4

## 2022-10-07 NOTE — Progress Notes (Signed)
   Heart Failure Stewardship Pharmacist Progress Note   PCP: [name], MD PCP-Cardiologist: Donato Schultz, MD    HPI:  82 yo M with PMH of CKD, T2DM, mild AS, and HTN.  Presented to the ED on 5/20 with shortness of breath and chest pain. EKG showed diffuse ST depressions with AVR elevation. Troponin >2K, BNP elevated, hypotensive, LE edema. States he is compliant with his medications. Had a positive stress test in 06/2022 and CAC was 6000. Taken to the cath lab and found 95% stenosis of left main, 90% stenosis of prox and mid LAD and 95% stenosis of obtuse marginal. LVEF ~30% by LV gram. IABP was placed for hemodynamic support. CT surgery consulted for possible CABG. Formal ECHO 5/20 showed LVEF 20-25%, global hypokinesis, G2DD, RV normal, elevated PA pressure, trivial MR, mild AR, mild AS. Ultimately, joint decision was made to not pursue surgical revascularization. IABP wean and removed on 5/21.   Current HF Medications: Diuretic: furosemide 40 mg PO BID  Prior to admission HF Medications: Diuretic: furosemide 20 mg daily PRN Beta blocker: carvedilol 6.25 mg BID ACE/ARB/ARNI: telmisartan 80 mg daily  Pertinent Lab Values: Serum creatinine 1.00, BUN 14, Potassium 3.8, Sodium 138, BNP 1750, Magnesium 1.8  Vital Signs: Weight: 166 lbs (admission weight: 166 lbs) Blood pressure: 90/60s Heart rate: 70-80s  I/O: -1.9L yesterday; net -4.1L  Medication Assistance / Insurance Benefits Check: Does the patient have prescription insurance?  Yes Type of insurance plan: Medicare  Does the patient qualify for medication assistance through manufacturers or grants?   Pending Eligible grants and/or patient assistance programs: pending Medication assistance applications in progress: none  Medication assistance applications approved: none Approved medication assistance renewals will be completed by: pending  Outpatient Pharmacy:  Prior to admission outpatient pharmacy: [facility] Is the patient  willing to use [facility] TOC pharmacy at discharge? Yes Is the patient willing to transition their outpatient pharmacy to utilize a [facility] outpatient pharmacy?   Pending    Assessment: 1. 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 NYHA class II symptoms. - Continue furosemide 40 mg PO BID. Strict I/Os and daily weights. Keep K>4 and check magnesium - Holding PTA carvedilol and cannot add further GDMT until BP improves   Plan: 1) Medication changes recommended at this time: - Continue current regimen  2) Patient assistance: Sherryll Burger copay $168 Marcelline Deist copay $17.97 - Jardiance copay $150.05  3)  Education  - To be completed prior to discharge  Sharen Hones, PharmD, BCPS Heart Failure Stewardship Pharmacist Phone [phone]

## 2022-10-07 NOTE — Progress Notes (Signed)
Heart Failure Nurse Navigator Progress Note  PCP: [name], MD PCP-Cardiologist: Anne Fu Admission Diagnosis: NSTEMI Admitted from: Home via EMS  Presentation:   [name] Steele presented with shortness of breath, weak cough, chest pain across his chest and chronic BLE edema. BP 87/63, HR 82, BNP 1,750, Trop 2,258, Lactic acid 2.3. CXR with small bilateral pleural effusions and bibasilar atelectasis or infiltrate. IV heparin started, Cath lab, found 95% stenosis of left main, stenose of obtuse marginal, TCTs consulted for possible CABG, joint decision not to pursue surgical revascularization, with plan for IABP wean and removal.   Patient and his wife were educated on the sign and symptoms of heart failure, daily weights, when to call his doctor or go to the ED, Diet/ fluid restrictions, taking all medications as prescribed and attending all medical appointments. Patient stated prior to all this he walked daily and worked in his garden, he wants to get back to do that. A HF TOC appointment was scheduled for 10/23/2022 @ 3 pm.   ECHO/ LVEF: 20-25%  Clinical Course:  Past Medical History:  Diagnosis Date   Arthritis    Chronic kidney disease, stage 3a (HCC)    Chronic urinary tract infection    Coronary artery calcification seen on CT scan    Diabetes mellitus without complication (HCC)    Gout    Heart murmur    Hypertension    ILD (interstitial lung disease) (HCC)    by CT 01/2021   Mild aortic stenosis    Mild dilation of ascending aorta (HCC)    Osteoporosis      Social History   Socioeconomic History   Marital status: Married    Spouse name: Not on file   Number of children: Not on file   Years of education: Not on file   Highest education level: Not on file  Occupational History   Not on file  Tobacco Use   Smoking status: Former    Packs/day: 1.00    Years: 31.00    Additional pack years: 0.00    Total pack years: 31.00    Types: Cigarettes    Quit date: 12     Years since quitting: 36.4   Smokeless tobacco: Never  Vaping Use   Vaping Use: Never used  Substance and Sexual Activity   Alcohol use: Yes    Alcohol/week: 0.0 standard drinks of alcohol    Comment: social   Drug use: No   Sexual activity: Not Currently  Other Topics Concern   Not on file  Social History Narrative   Not on file   Social Determinants of Health   Financial Resource Strain: Not on file  Food Insecurity: No Food Insecurity (02/06/2022)   Hunger Vital Sign    Worried About Running Out of Food in the Last Year: Never true    Ran Out of Food in the Last Year: Never true  Transportation Needs: No Transportation Needs (02/06/2022)   PRAPARE - Administrator, Civil Service (Medical): No    Lack of Transportation (Non-Medical): No  Physical Activity: Not on file  Stress: Not on file  Social Connections: Not on file   Education Assessment and Provision:  Detailed education and instructions provided on heart failure disease management including the following:  Signs and symptoms of Heart Failure When to call the physician Importance of daily weights Low sodium diet Fluid restriction Medication management Anticipated future follow-up appointments  Patient education given on each of the above topics.  Patient acknowledges  understanding via teach back method and acceptance of all instructions.  Education Materials:  "Living Better With Heart Failure" Booklet, HF zone tool, & Daily Weight Tracker Tool.  Patient has scale at home: Yes Patient has pill box at home: Yes    High Risk Criteria for Readmission and/or Poor Patient Outcomes: Heart failure hospital admissions (last 6 months): 0  No Show rate: 2 % Difficult social situation: No, lives with wife.  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   Items for Follow-up on DC/TOC: Diet/ fluids/ daily weights Continued HF education   Rhae Hammock, BSN, RN Heart Failure Teacher, adult education Only

## 2022-10-07 NOTE — Progress Notes (Signed)
Rounding Note    Patient Name: [name] Steele Date of Encounter: 10/07/2022  [facility] Cardiologist: Donato Schultz, MD   Subjective   No chest pain or shortness of breath.  Intra-aortic balloon pump was weaned off yesterday.  He has remained stable since.  Inpatient Medications    Scheduled Meds:  aspirin EC  81 mg Oral Daily   atorvastatin  80 mg Oral Daily   Chlorhexidine Gluconate Cloth  6 each Topical Daily   clopidogrel  75 mg Oral Daily   furosemide  40 mg Oral BID   insulin aspart  0-15 Units Subcutaneous TID WC   sodium chloride flush  10-40 mL Intracatheter Q12H   sodium chloride flush  3 mL Intravenous Q12H   Continuous Infusions:  sodium chloride     sodium chloride Stopped (10/06/22 1202)   PRN Meds: sodium chloride, sodium chloride, acetaminophen, diazepam, ipratropium, morphine injection, ondansetron (ZOFRAN) IV, mouth rinse, sodium chloride flush, sodium chloride flush   Vital Signs    Vitals:   10/06/22 2300 10/07/22 0000 10/07/22 0400 10/07/22 0650  BP:  109/69 96/62   Pulse:  84 68   Resp:  19 20   Temp: 99.1 F (37.3 C)   98.4 F (36.9 C)  TempSrc: Oral   Oral  SpO2:  99% 98%   Weight:      Height:        Intake/Output Summary (Last 24 hours) at 10/07/2022 0755 Last data filed at 10/07/2022 0600 Gross per 24 hour  Intake 66.41 ml  Output 2600 ml  Net -2533.59 ml      10/05/2022    7:00 AM 10/05/2022   12:33 AM 07/31/2022   10:44 AM  Last 3 Weights  Weight (lbs) 166 lb 10.7 oz 135 lb 147 lb 11.3 oz  Weight (kg) 75.6 kg 61.236 kg 67 kg      Telemetry    Sinus rhythm- Personally Reviewed  ECG    Not performed today -personally Reviewed  Physical Exam   GEN: No acute distress.   Neck: No JVD Cardiac: RRR, no murmurs, rubs, or gallops.  Respiratory: Clear to auscultation bilaterally. GI: Soft, nontender, non-distended  MS: No edema; No deformity. Neuro:  Nonfocal  Psych: Normal affect  Peripheral vascular:  Dopplerable pedal pulses bilaterally  Labs    High Sensitivity Troponin:   Recent Labs  Lab 10/05/22 0130 10/05/22 0248  TROPONINIHS 2,206* 2,258*     Chemistry Recent Labs  Lab 10/05/22 0130 10/05/22 0543 10/05/22 0544 10/06/22 0302 10/07/22 0038  NA 138   < > 141 138 138  K 3.9   < > 4.0 3.8 3.8  CL 103  --   --  103 103  CO2 25  --   --  26 27  GLUCOSE 150*  --   --  106* 98  BUN 16  --   --  15 14  CREATININE 1.21  --   --  0.98 1.00  CALCIUM 8.6*  --   --  8.5* 8.3*  MG  --   --   --   --  1.8  GFRNONAA 60*  --   --  >60 >60  ANIONGAP 10  --   --  9 8   < > = values in this interval not displayed.    Lipids No results for input(s): "CHOL", "TRIG", "HDL", "LABVLDL", "LDLCALC", "CHOLHDL" in the last 168 hours.  Hematology Recent Labs  Lab 10/05/22 0130 10/05/22 0543 10/05/22  1610 10/06/22 0302 10/07/22 0038  WBC 6.8  --   --  4.9 5.1  RBC 3.56*  --   --  3.43* 3.43*  HGB 11.3*   < > 10.9* 10.8* 10.6*  HCT 36.3*   < > 32.0* 34.3* 33.3*  MCV 102.0*  --   --  100.0 97.1  MCH 31.7  --   --  31.5 30.9  MCHC 31.1  --   --  31.5 31.8  RDW 14.6  --   --  14.5 14.3  PLT 208  --   --  189 191   < > = values in this interval not displayed.   Thyroid No results for input(s): "TSH", "FREET4" in the last 168 hours.  BNP Recent Labs  Lab 10/05/22 0130  BNP 1,750.0*    DDimer No results for input(s): "DDIMER" in the last 168 hours.   Radiology    DG CHEST PORT 1 VIEW  Result Date: 10/06/2022 CLINICAL DATA:  Intra-aortic balloon pump. EXAM: PORTABLE CHEST 1 VIEW COMPARISON:  10/05/2022 FINDINGS: The intra-aortic balloon pump marker is approximately 5.1 cm below the aortic arch. In stable cardiomediastinal contours. Aortic atherosclerotic calcifications. Unchanged bilateral pleural effusions and mild interstitial edema. No new findings. IMPRESSION: No significant change in position of aortic balloon pump. Persistent small pleural effusions and mild interstitial  edema. Electronically Signed   By: Signa Kell M.D.   On: 10/06/2022 08:25   ECHOCARDIOGRAM COMPLETE  Result Date: 10/05/2022    ECHOCARDIOGRAM REPORT   Patient Name:   [name] Steele Date of Exam: 10/05/2022 Medical Rec #:  [id number]        Height:       70.0 in Accession #:    [id number]       Weight:       166.7 lb Date of Birth:  1940/08/15         BSA:          1.932 m Patient Age:    82 years         BP:           133/71 mmHg Patient Gender: M                HR:           63 bpm. Exam Location:  Inpatient Procedure: 2D Echo, Color Doppler, Cardiac Doppler and Intracardiac            Opacification Agent Indications:    R94.31 Abnormal EKG  History:        Patient has prior history of Echocardiogram examinations, most                 recent 02/07/2022. CAD; Risk Factors:Hypertension and Diabetes.  Sonographer:    Irving Burton Senior RDCS Referring Phys: [phone] MICHAEL COOPER  Sonographer Comments: Scanned supine on balloon pump IMPRESSIONS  1. Presence of apical swirling/sludge with definity contrast without evidence of formed thrombus. Left ventricular ejection fraction, by estimation, is 20 to 25%. The left ventricle has severely decreased function. The left ventricle demonstrates global  hypokinesis. Left ventricular diastolic parameters are consistent with Grade II diastolic dysfunction (pseudonormalization).  2. Right ventricular systolic function is normal. The right ventricular size is normal. There is moderately elevated pulmonary artery systolic pressure. The estimated right ventricular systolic pressure is 59.1 mmHg.  3. The mitral valve is grossly normal. Trivial mitral valve regurgitation.  4. Tricuspid valve regurgitation is mild to moderate.  5. The aortic valve is  calcified. There is moderate calcification of the aortic valve. There is moderate thickening of the aortic valve. Aortic valve regurgitation is mild. Mild aortic valve stenosis. Aortic valve area, by VTI measures 1.11 cm. Aortic valve mean  gradient measures 10.0 mmHg. Aortic valve Vmax measures 2.06 m/s.  6. The inferior vena cava is dilated in size with <50% respiratory variability, suggesting right atrial pressure of 15 mmHg. Comparison(s): Changes from prior study are noted. Conclusion(s)/Recommendation(s): Severe LV dysfunction, with severe hypokinesis to akinesis worst at the apex, present throughout LAD/LCx territory. Basal to mid inferior/inferolateral walls with preserved function. FINDINGS  Left Ventricle: Presence of apical swirling/sludge with definity contrast without evidence of formed thrombus. Left ventricular ejection fraction, by estimation, is 20 to 25%. The left ventricle has severely decreased function. The left ventricle demonstrates global hypokinesis. Definity contrast agent was given IV to delineate the left ventricular endocardial borders. The left ventricular internal cavity size was normal in size. There is no left ventricular hypertrophy. Left ventricular diastolic parameters are consistent with Grade II diastolic dysfunction (pseudonormalization).  LV Wall Scoring: The apical lateral segment, apical septal segment, apical anterior segment, and apex are akinetic. The anterior wall, antero-lateral wall, anterior septum, mid inferoseptal segment, apical inferior segment, and basal inferoseptal segment are hypokinetic. The inferior wall and posterior wall are normal. Right Ventricle: The right ventricular size is normal. No increase in right ventricular wall thickness. Right ventricular systolic function is normal. There is moderately elevated pulmonary artery systolic pressure. The tricuspid regurgitant velocity is 3.32 m/s, and with an assumed right atrial pressure of 15 mmHg, the estimated right ventricular systolic pressure is 59.1 mmHg. Left Atrium: Left atrial size was normal in size. Right Atrium: Right atrial size was normal in size. Pericardium: Trivial pericardial effusion is present. Mitral Valve: The mitral valve  is grossly normal. Trivial mitral valve regurgitation. Tricuspid Valve: The tricuspid valve is grossly normal. Tricuspid valve regurgitation is mild to moderate. Aortic Valve: The aortic valve is calcified. There is moderate calcification of the aortic valve. There is moderate thickening of the aortic valve. Aortic valve regurgitation is mild. Mild aortic stenosis is present. Aortic valve mean gradient measures 10.0 mmHg. Aortic valve peak gradient measures 17.0 mmHg. Aortic valve area, by VTI measures 1.11 cm. Pulmonic Valve: The pulmonic valve was not well visualized. Pulmonic valve regurgitation is trivial. Aorta: The ascending aorta was not well visualized and the aortic root is normal in size and structure. Venous: The inferior vena cava is dilated in size with less than 50% respiratory variability, suggesting right atrial pressure of 15 mmHg. IAS/Shunts: The atrial septum is grossly normal. Additional Comments: There is pleural effusion in the left lateral region.  LEFT VENTRICLE PLAX 2D LVIDd:         5.80 cm      Diastology LVIDs:         5.40 cm      LV e' lateral:   9.14 cm/s LV PW:         0.70 cm      LV E/e' lateral: 8.6 LV IVS:        0.60 cm LVOT diam:     1.90 cm LV SV:         40 LV SV Index:   21 LVOT Area:     2.84 cm  LV Volumes (MOD) LV vol d, MOD A2C: 102.0 ml LV vol d, MOD A4C: 106.0 ml LV vol s, MOD A2C: 76.3 ml LV vol s, MOD A4C:  75.7 ml LV SV MOD A2C:     25.7 ml LV SV MOD A4C:     106.0 ml LV SV MOD BP:      27.5 ml RIGHT VENTRICLE RV S prime:     18.60 cm/s TAPSE (M-mode): 1.7 cm LEFT ATRIUM             Index        RIGHT ATRIUM           Index LA diam:        3.60 cm 1.86 cm/m   RA Area:     14.40 cm LA Vol (A2C):   48.4 ml 25.03 ml/m  RA Volume:   34.20 ml  17.70 ml/m LA Vol (A4C):   63.2 ml 32.73 ml/m LA Biplane Vol: 48.4 ml 25.06 ml/m  AORTIC VALVE AV Area (Vmax):    1.18 cm AV Area (Vmean):   1.32 cm AV Area (VTI):     1.11 cm AV Vmax:           206.33 cm/s AV Vmean:           146.667 cm/s AV VTI:            0.356 m AV Peak Grad:      17.0 mmHg AV Mean Grad:      10.0 mmHg LVOT Vmax:         86.20 cm/s LVOT Vmean:        68.400 cm/s LVOT VTI:          0.140 m LVOT/AV VTI ratio: 0.39  AORTA Ao Root diam: 3.40 cm MITRAL VALVE               TRICUSPID VALVE MV Area (PHT): 3.72 cm    TR Peak grad:   44.1 mmHg MV Decel Time: 204 msec    TR Vmax:        332.00 cm/s MV E velocity: 78.60 cm/s MV A velocity: 50.80 cm/s  SHUNTS MV E/A ratio:  1.55        Systemic VTI:  0.14 m                            Systemic Diam: 1.90 cm Jodelle Red MD Electronically signed by Jodelle Red MD Signature Date/Time: 10/05/2022/12:11:01 PM    Final    DG CHEST PORT 1 VIEW  Result Date: 10/05/2022 CLINICAL DATA:  Management of intra-aortic balloon pump. EXAM: PORTABLE CHEST 1 VIEW COMPARISON:  10/05/2022 FINDINGS: Heart size is normal accounting for patient position. Intra-aortic balloon pump has been placed, with radiopaque marker approximately 4.8 centimeters below the aortic arch. Position of the radiopaque marker is slightly LATERAL, consistent with tortuous aorta. There are small bilateral pleural effusions. Diffuse bilateral interstitial prominence is consistent with mild edema. No new consolidations. IMPRESSION: 1. Interval placement of intra-aortic balloon pump. 2. Small bilateral pleural effusions and mild interstitial edema. These results were called by telephone at the time of interpretation on 10/05/2022 at 10:55 am to provider [employer] , who verbally acknowledged these results. Electronically Signed   By: Norva Pavlov M.D.   On: 10/05/2022 10:55    Cardiac Studies   2D echo (10/05/2022)  IMPRESSIONS     1. Presence of apical swirling/sludge with definity contrast without  evidence of formed thrombus. Left ventricular ejection fraction, by  estimation, is 20 to 25%. The left ventricle has severely decreased  function. The left ventricle demonstrates  global    hypokinesis. Left ventricular diastolic parameters are consistent with  Grade II diastolic dysfunction (pseudonormalization).   2. Right ventricular systolic function is normal. The right ventricular  size is normal. There is moderately elevated pulmonary artery systolic  pressure. The estimated right ventricular systolic pressure is 59.1 mmHg.   3. The mitral valve is grossly normal. Trivial mitral valve  regurgitation.   4. Tricuspid valve regurgitation is mild to moderate.   5. The aortic valve is calcified. There is moderate calcification of the  aortic valve. There is moderate thickening of the aortic valve. Aortic  valve regurgitation is mild. Mild aortic valve stenosis. Aortic valve  area, by VTI measures 1.11 cm. Aortic  valve mean gradient measures 10.0 mmHg. Aortic valve Vmax measures 2.06  m/s.   6. The inferior vena cava is dilated in size with <50% respiratory  variability, suggesting right atrial pressure of 15 mmHg.    Cardiac catheterization (10/05/2022)  Conclusion  1.  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  Severe proximal and mid LAD stenoses of 90% 3.  Severe obtuse marginal stenosis of 95% 4.  Nondominant RCA, not visualized 5.  Severe segmental LV systolic dysfunction with LVEF estimated at 30% 6.  Mild aortic insufficiency assessed by supravalvular aortogram 7.  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  Successful intra-aortic balloon pump insertion   Recommendations: IV heparin, continue IABP 1:1, cardiac surgical consultation.  Difficult situation in this elderly patient, will need heart team approach to his care.  Diagnostic Dominance: Left  Intervention   Patient Profile     [name] Steele is a 82 y.o. male with pmh sx for CKD, DM, HTN, Mild AS, Mild dilatation of ascending aorta and CAC of 600 on CT scan  who is being seen 10/05/2022 for the evaluation of NSTEMI.   Assessment & Plan    1: CAD/ischemic  cardiomyopathy-patient presented with chest pain with diffuse ST segment depression and elevation in aVR.  Troponins rose to 6000.  He underwent urgent cardiac catheterization by Dr. Excell Seltzer revealing left main disease and a left dominant system.  He had surgical anatomy.  An intra-aortic balloon pump was placed because of hemodynamic instability which resulted in stable hemodynamics.  The patient was evaluated by Dr. Cliffton Asters, cardiothoracic surgery, and the joint decision was that he was not a surgical candidate and did not want to pursue surgical revascularization.  Based on this, I discontinue the intra-aortic balloon pump.  His blood pressures have remained in the 90/60 range although he has remained asymptomatic.  His groin is stable.  He wishes to be a DNR.  He is on dual antiplatelet therapy.  Plan to transfer to telemetry, Cardiac rehab, compression stockings, probably home in the next 48 hours.  For questions or updates, please contact [facility] Please consult [email or web address] for contact info under        Runell Gess, M.D., FACP, [facility], Kathryne Eriksson [facility] [address] [place], [place]  [address]  [phone] 10/07/2022 7:58 AM

## 2022-10-08 DIAGNOSIS — I255 Ischemic cardiomyopathy: Secondary | ICD-10-CM | POA: Diagnosis not present

## 2022-10-08 DIAGNOSIS — I214 Non-ST elevation (NSTEMI) myocardial infarction: Secondary | ICD-10-CM | POA: Diagnosis not present

## 2022-10-08 LAB — CBC
HCT: 33.9 % — ABNORMAL LOW (ref 39.0–52.0)
Hemoglobin: 10.7 g/dL — ABNORMAL LOW (ref 13.0–17.0)
MCH: 30.5 pg (ref 26.0–34.0)
MCHC: 31.6 g/dL (ref 30.0–36.0)
MCV: 96.6 fL (ref 80.0–100.0)
Platelets: 202 10*3/uL (ref 150–400)
RBC: 3.51 MIL/uL — ABNORMAL LOW (ref 4.22–5.81)
RDW: 14.5 % (ref 11.5–15.5)
WBC: 5.6 10*3/uL (ref 4.0–10.5)
nRBC: 0 % (ref 0.0–0.2)

## 2022-10-08 LAB — BASIC METABOLIC PANEL
Anion gap: 8 (ref 5–15)
BUN: 17 mg/dL (ref 8–23)
CO2: 28 mmol/L (ref 22–32)
Calcium: 8.8 mg/dL — ABNORMAL LOW (ref 8.9–10.3)
Chloride: 104 mmol/L (ref 98–111)
Creatinine, Ser: 1.17 mg/dL (ref 0.61–1.24)
GFR, Estimated: >60 mL/min (ref >60)
Glucose, Bld: 96 mg/dL (ref 70–99)
Potassium: 3.8 mmol/L (ref 3.5–5.1)
Sodium: 140 mmol/L (ref 135–145)

## 2022-10-08 LAB — GLUCOSE, CAPILLARY
Glucose-Capillary: 65 mg/dL — ABNORMAL LOW (ref 70–99)
Glucose-Capillary: 106 mg/dL — ABNORMAL HIGH (ref 70–99)
Glucose-Capillary: 137 mg/dL — ABNORMAL HIGH (ref 70–99)
Glucose-Capillary: 106 mg/dL — ABNORMAL HIGH (ref 70–99)
Glucose-Capillary: 141 mg/dL — ABNORMAL HIGH (ref 70–99)

## 2022-10-08 MED ORDER — EMPAGLIFLOZIN 10 MG PO TABS: 10.0000 mg | ORAL_TABLET | Freq: Every day | ORAL | Status: DC

## 2022-10-08 MED ORDER — DAPAGLIFLOZIN PROPANEDIOL 10 MG PO TABS: 10.0000 mg | ORAL_TABLET | Freq: Every day | ORAL | Status: DC

## 2022-10-08 MED ORDER — NITROFURANTOIN MONOHYD MACRO 100 MG PO CAPS
100.0000 mg | ORAL_CAPSULE | Freq: Two times a day (BID) | ORAL | Status: DC
  Administered 2022-10-08 – 2022-10-10 (×5): 100 mg via ORAL
  Filled 2022-10-08 (×6): qty 1

## 2022-10-08 MED ORDER — FUROSEMIDE 40 MG PO TABS: 40.0000 mg | ORAL_TABLET | Freq: Every day | ORAL | Status: DC

## 2022-10-08 MED ORDER — FUROSEMIDE 40 MG PO TABS
40.0000 mg | ORAL_TABLET | Freq: Every day | ORAL | Status: DC
  Administered 2022-10-08 – 2022-10-10 (×3): 40 mg via ORAL
  Filled 2022-10-08 (×2): qty 1

## 2022-10-08 NOTE — Care Management Important Message (Signed)
Important Message  Patient Details  Name: Polo Torchia MRN: [id number] Date of Birth: 09-Nov-1940   Medicare Important Message Given:  Yes     Sherilyn Banker 10/08/2022, 3:52 PM

## 2022-10-08 NOTE — Progress Notes (Addendum)
   Heart Failure Stewardship Pharmacist Progress Note   PCP: [name], MD PCP-Cardiologist: Donato Schultz, MD    HPI:  82 yo M with PMH of CKD, T2DM, mild AS, and HTN.  Presented to the ED on 5/20 with shortness of breath and chest pain. EKG showed diffuse ST depressions with AVR elevation. Troponin >2K, BNP elevated, hypotensive, LE edema. States he is compliant with his medications. Had a positive stress test in 06/2022 and CAC was 6000. Taken to the cath lab and found 95% stenosis of left main, 90% stenosis of prox and mid LAD and 95% stenosis of obtuse marginal. LVEF ~30% by LV gram. IABP was placed for hemodynamic support. CT surgery consulted for possible CABG. Formal ECHO 5/20 showed LVEF 20-25%, global hypokinesis, G2DD, RV normal, elevated PA pressure, trivial MR, mild AR, mild AS. Ultimately, joint decision was made to not pursue surgical revascularization. IABP wean and removed on 5/21.   Current HF Medications: Diuretic: furosemide 40 mg PO daily  Prior to admission HF Medications: Diuretic: furosemide 20 mg daily PRN Beta blocker: carvedilol 6.25 mg BID ACE/ARB/ARNI: telmisartan 80 mg daily  Pertinent Lab Values: Serum creatinine 1.17, BUN 17, Potassium 3.8, Sodium 140, BNP 1750, Magnesium 1.8  Vital Signs: Weight: 166 lbs (admission weight: 166 lbs) Blood pressure: 90-100/60s Heart rate: 70-80s  I/O: -1.6L yesterday; net -5L  Medication Assistance / Insurance Benefits Check: Does the patient have prescription insurance?  Yes Type of insurance plan: Medicare  Does the patient qualify for medication assistance through manufacturers or grants?   Pending Eligible grants and/or patient assistance programs: pending Medication assistance applications in progress: none  Medication assistance applications approved: none Approved medication assistance renewals will be completed by: pending  Outpatient Pharmacy:  Prior to admission outpatient pharmacy: [facility], mail  order Is the patient willing to use [facility] TOC pharmacy at discharge? Yes Is the patient willing to transition their outpatient pharmacy to utilize a [facility] outpatient pharmacy?   No    Assessment: 1. 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 NYHA class II symptoms. - Agree with reducing to furosemide 40 mg PO daily. Strict I/Os and daily weights. Keep K>4 and Mg>2. - Holding PTA carvedilol and cannot add further GDMT until BP improves   Plan: 1) Medication changes recommended at this time: - Continue current regimen  2) Patient assistance: Sherryll Burger copay $168 Marcelline Deist copay $17.97 - Jardiance copay $150.05  3)  Education  - Patient has been educated on current HF medications and potential additions to HF medication regimen - Patient verbalizes understanding that over the next few months, these medication doses may change and more medications may be added to optimize HF regimen - Patient has been educated on basic disease state pathophysiology and goals of therapy   Sharen Hones, PharmD, BCPS Heart Failure Stewardship Pharmacist Phone [phone]

## 2022-10-08 NOTE — Plan of Care (Signed)

## 2022-10-08 NOTE — Progress Notes (Addendum)
Rounding Note    Patient Name: [name] Steele Date of Encounter: 10/08/2022  [facility] Cardiologist: Donato Schultz, MD   Subjective   Complains of dysuria this morning, no chest pain. Breathing is ok  Inpatient Medications    Scheduled Meds:  aspirin EC  81 mg Oral Daily   atorvastatin  80 mg Oral Daily   Chlorhexidine Gluconate Cloth  6 each Topical Daily   clopidogrel  75 mg Oral Daily   enoxaparin (LOVENOX) injection  40 mg Subcutaneous Q24H   furosemide  40 mg Oral BID   insulin aspart  0-15 Units Subcutaneous TID WC   sodium chloride flush  10-40 mL Intracatheter Q12H   sodium chloride flush  3 mL Intravenous Q12H   Continuous Infusions:  sodium chloride     sodium chloride Stopped (10/06/22 1202)   PRN Meds: sodium chloride, sodium chloride, acetaminophen, diazepam, ipratropium, morphine injection, ondansetron (ZOFRAN) IV, mouth rinse, sodium chloride flush, sodium chloride flush   Vital Signs    Vitals:   10/07/22 1957 10/07/22 2356 10/08/22 0326 10/08/22 0330  BP: (!) 99/50 110/70 104/63   Pulse: 79 82 79 73  Resp: 17 18 19    Temp: 97.8 F (36.6 C) 97.7 F (36.5 C) 97.6 F (36.4 C)   TempSrc: Oral Oral Oral   SpO2:   (!) 89% 92%  Weight:      Height:        Intake/Output Summary (Last 24 hours) at 10/08/2022 0815 Last data filed at 10/08/2022 1610 Gross per 24 hour  Intake 240 ml  Output 1250 ml  Net -1010 ml      10/05/2022    7:00 AM 10/05/2022   12:33 AM 07/31/2022   10:44 AM  Last 3 Weights  Weight (lbs) 166 lb 10.7 oz 135 lb 147 lb 11.3 oz  Weight (kg) 75.6 kg 61.236 kg 67 kg      Telemetry    Sinus Rhythm - Personally Reviewed  ECG    No new tracing  Physical Exam   GEN: Thin, frail older male, no acute distress.   Neck: No JVD Cardiac: RRR, no murmurs, rubs, or gallops.  Respiratory: Clear to auscultation bilaterally. GI: Soft, nontender, non-distended  MS: No edema; No deformity. Neuro:  Nonfocal  Psych:  Normal affect   Labs    High Sensitivity Troponin:   Recent Labs  Lab 10/05/22 0130 10/05/22 0248  TROPONINIHS 2,206* 2,258*     Chemistry Recent Labs  Lab 10/06/22 0302 10/07/22 0038 10/08/22 0257  NA 138 138 140  K 3.8 3.8 3.8  CL 103 103 104  CO2 26 27 28   GLUCOSE 106* 98 96  BUN 15 14 17   CREATININE 0.98 1.00 1.17  CALCIUM 8.5* 8.3* 8.8*  MG  --  1.8  --   GFRNONAA >60 >60 >60  ANIONGAP 9 8 8     Lipids No results for input(s): "CHOL", "TRIG", "HDL", "LABVLDL", "LDLCALC", "CHOLHDL" in the last 168 hours.  Hematology Recent Labs  Lab 10/06/22 0302 10/07/22 0038 10/08/22 0257  WBC 4.9 5.1 5.6  RBC 3.43* 3.43* 3.51*  HGB 10.8* 10.6* 10.7*  HCT 34.3* 33.3* 33.9*  MCV 100.0 97.1 96.6  MCH 31.5 30.9 30.5  MCHC 31.5 31.8 31.6  RDW 14.5 14.3 14.5  PLT 189 191 202   Thyroid No results for input(s): "TSH", "FREET4" in the last 168 hours.  BNP Recent Labs  Lab 10/05/22 0130  BNP 1,750.0*    DDimer No results  for input(s): "DDIMER" in the last 168 hours.   Radiology    No results found.  Cardiac Studies   2D echo (10/05/2022)   IMPRESSIONS     1. Presence of apical swirling/sludge with definity contrast without  evidence of formed thrombus. Left ventricular ejection fraction, by  estimation, is 20 to 25%. The left ventricle has severely decreased  function. The left ventricle demonstrates global   hypokinesis. Left ventricular diastolic parameters are consistent with  Grade II diastolic dysfunction (pseudonormalization).   2. Right ventricular systolic function is normal. The right ventricular  size is normal. There is moderately elevated pulmonary artery systolic  pressure. The estimated right ventricular systolic pressure is 59.1 mmHg.   3. The mitral valve is grossly normal. Trivial mitral valve  regurgitation.   4. Tricuspid valve regurgitation is mild to moderate.   5. The aortic valve is calcified. There is moderate calcification of the   aortic valve. There is moderate thickening of the aortic valve. Aortic  valve regurgitation is mild. Mild aortic valve stenosis. Aortic valve  area, by VTI measures 1.11 cm. Aortic  valve mean gradient measures 10.0 mmHg. Aortic valve Vmax measures 2.06  m/s.   6. The inferior vena cava is dilated in size with <50% respiratory  variability, suggesting right atrial pressure of 15 mmHg.      Cardiac catheterization (10/05/2022)   Conclusion   1.  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  Severe proximal and mid LAD stenoses of 90% 3.  Severe obtuse marginal stenosis of 95% 4.  Nondominant RCA, not visualized 5.  Severe segmental LV systolic dysfunction with LVEF estimated at 30% 6.  Mild aortic insufficiency assessed by supravalvular aortogram 7.  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  Successful intra-aortic balloon pump insertion   Recommendations: IV heparin, continue IABP 1:1, cardiac surgical consultation.  Difficult situation in this elderly patient, will need heart team approach to his care.   Diagnostic Dominance: Left   Patient Profile     82 y.o. male with pmh sx for CKD, DM, HTN, Mild AS, Mild dilatation of ascending aorta and CAC of 600 on CT scan who is being seen 10/05/2022 for the evaluation of NSTEMI.   Assessment & Plan    NSTEMI -- presented with chest pain and noted to have diffuse ST segment depression with elevation in aVR. Underwent cardiac cath noted above with severe LM disease 95%, 90% pLAD, 95% OM. IABP placed while in the cath lab. Evaluated by TCTS and felt not to be a good surgical candidate, patient did not want to pursue surgical revascularization.  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%, g2dd, normal RV, trivial MR, mild to moderate TR, hypokinesis to akinesis of the apex,  present throughout the LAD/LCx territory -- has been diuresing well, - 5L -- GDMT: limited in setting of hypotension, reduce lasix to 40mg  daily   HTN -- blood pressures remain soft   Dysuria -- reported this morning -- check UA and urine culture -- will plan for Macrobid 100mg  BID once urine collected   Mild Aortic stenosis  Will order PT/OT evaluation today  For questions or updates, please contact [facility] Please consult [email or web address] for contact info under        Signed, Laverda Page, NP  10/08/2022, 8:15 AM  Agree with note by Laverda Page NP-C  Patient looks better this morning.  He is up in the chair and asymptomatic.  He transferred from [facility].  PT OT have been consulted for ambulation.  He does complain of symptoms of UTI.  Will get a urine culture and start antibiotics empirically.  He does have severe LV dysfunction and left main/three-vessel disease not a candidate for percutaneous or surgical intervention.  He is a DNR.  Will get a palliative care consult.  At this point, his blood pressure is too soft to start GDMT.  Probably home over the weekend.  Runell Gess, M.D., FACP, [facility], [facility] [address] [place], [place]  [address]  [phone] 10/08/2022 10:31 AM

## 2022-10-08 NOTE — Plan of Care (Signed)
  Problem: Education: Goal: Understanding of cardiac disease, CV risk reduction, and recovery process will improve Outcome: Progressing   

## 2022-10-08 NOTE — Progress Notes (Addendum)
Pt educated using "Bouncing Back" book, ex guidelines, s/s to stop exercising, NTG use and calling 911, heart healthy diet, and risk factors. Pt not currently appropriate for CRP2, will change if he becomes medically stable.    Faustino Congress MS ACSM-CEP 10/08/2022 10:07 AM

## 2022-10-09 DIAGNOSIS — Z515 Encounter for palliative care: Secondary | ICD-10-CM

## 2022-10-09 DIAGNOSIS — Z7189 Other specified counseling: Secondary | ICD-10-CM | POA: Diagnosis not present

## 2022-10-09 DIAGNOSIS — I519 Heart disease, unspecified: Secondary | ICD-10-CM

## 2022-10-09 DIAGNOSIS — I5043 Acute on chronic combined systolic (congestive) and diastolic (congestive) heart failure: Secondary | ICD-10-CM | POA: Diagnosis not present

## 2022-10-09 DIAGNOSIS — I214 Non-ST elevation (NSTEMI) myocardial infarction: Secondary | ICD-10-CM | POA: Diagnosis not present

## 2022-10-09 LAB — CBC
HCT: 35.6 % — ABNORMAL LOW (ref 39.0–52.0)
Hemoglobin: 11.3 g/dL — ABNORMAL LOW (ref 13.0–17.0)
MCH: 31.4 pg (ref 26.0–34.0)
MCHC: 31.7 g/dL (ref 30.0–36.0)
MCV: 98.9 fL (ref 80.0–100.0)
Platelets: 221 10*3/uL (ref 150–400)
RBC: 3.6 MIL/uL — ABNORMAL LOW (ref 4.22–5.81)
RDW: 14.2 % (ref 11.5–15.5)
WBC: 4.8 10*3/uL (ref 4.0–10.5)
nRBC: 0 % (ref 0.0–0.2)

## 2022-10-09 LAB — GLUCOSE, CAPILLARY
Glucose-Capillary: 50 mg/dL — ABNORMAL LOW (ref 70–99)
Glucose-Capillary: 80 mg/dL (ref 70–99)
Glucose-Capillary: 95 mg/dL (ref 70–99)
Glucose-Capillary: 103 mg/dL — ABNORMAL HIGH (ref 70–99)
Glucose-Capillary: 163 mg/dL — ABNORMAL HIGH (ref 70–99)

## 2022-10-09 MED ORDER — ROSUVASTATIN CALCIUM 20 MG PO TABS: 20.0000 mg | ORAL_TABLET | Freq: Every day | ORAL | Status: DC

## 2022-10-09 MED ORDER — ROSUVASTATIN CALCIUM 20 MG PO TABS
20.0000 mg | ORAL_TABLET | Freq: Every day | ORAL | Status: DC
  Administered 2022-10-10: 20 mg via ORAL
  Filled 2022-10-09: qty 1

## 2022-10-09 NOTE — Progress Notes (Signed)
CARDIAC REHAB PHASE I   PRE:  Rate/Rhythm: 90 NSR  BP:  Sitting: 102/64      SaO2: 96 RA  MODE:  Ambulation: 350 ft   AD:   rollator  POST:  Rate/Rhythm: Not recorded: Helping pt to chair  BP:  Sitting: 81/54      SaO2: 95 RA  Pt amb with contact guard assistance using gait belt, pt denies CP and SOB during amb and was returned to room w/o complaint. Pt denies dizziness, lightheadedness etc, pt helped to bed.   Faustino Congress  ACSM-CEP 2:38 PM 10/09/2022    Service time is from 1402 to 1438.

## 2022-10-09 NOTE — TOC Progression Note (Cosign Needed)
Transition of Care [facility]) - Progression Note    Patient Details  Name: [name] Steele MRN: [id number] Date of Birth: [birth year]  [facility]) CM/SW Contact  Graves-Bigelow, Lamar Laundry, RN Phone Number: 10/09/2022, 11:12 AM  Clinical Narrative:  Case Manager spoke with patients spouse regarding HH and DME needs. Spouse is agreeable to [facility]- Referral sent to [facility] and start of care to begin within 24-48 hours post transition home. Referral for DME submitted to [employer] and DME will be delivered on Sat. Patient will need orders for RN/PT/OT and F2F. No further needs identified at this time.   Expected Discharge Plan: Home w Home Health Services Barriers to Discharge: No Barriers Identified  Expected Discharge Plan and Services In-house Referral: NA Discharge Planning Services: CM Consult Post Acute Care Choice: Home Health Living arrangements for the past 2 months: Single Family Home                 DME Arranged: Walker rolling with seat, Bedside commode DME Agency: [employer] Date DME Agency Contacted: 10/09/22 Time DME Agency Contacted: [phone] Representative spoke with at DME Agency: Vaughan Basta HH Arranged: RN, Disease Management, OT, PT HH Agency: [facility] Date [facility] Agency Contacted: 10/09/22 Time HH Agency Contacted: 1100 Representative spoke with at [facility] Agency: Kandee Keen   Social Determinants of Health (SDOH) Interventions SDOH Screenings   Food Insecurity: No Food Insecurity (10/07/2022)  Housing: Low Risk  (10/07/2022)  Transportation Needs: No Transportation Needs (10/07/2022)  Utilities: Not At Risk (10/08/2022)  Alcohol Screen: Low Risk  (10/07/2022)  Financial Resource Strain: Low Risk  (10/07/2022)  Tobacco Use: Medium Risk (10/07/2022)    Readmission Risk Interventions     No data to display

## 2022-10-09 NOTE — Plan of Care (Signed)
  Problem: Education: Goal: Understanding of cardiac disease, CV risk reduction, and recovery process will improve Outcome: Progressing Goal: Individualized Educational Video(s) Outcome: Progressing   Problem: Activity: Goal: Ability to tolerate increased activity will improve Outcome: Progressing   Problem: Cardiac: Goal: Ability to achieve and maintain adequate cardiovascular perfusion will improve Outcome: Progressing   Problem: Health Behavior/Discharge Planning: Goal: Ability to safely manage health-related needs after discharge will improve Outcome: Progressing   Problem: Education: Goal: Knowledge of General Education information will improve Description: Including pain rating scale, medication(s)/side effects and non-pharmacologic comfort measures Outcome: Progressing   Problem: Health Behavior/Discharge Planning: Goal: Ability to manage health-related needs will improve Outcome: Progressing   Problem: Clinical Measurements: Goal: Ability to maintain clinical measurements within normal limits will improve Outcome: Progressing Goal: Will remain free from infection Outcome: Progressing Goal: Diagnostic test results will improve Outcome: Progressing Goal: Respiratory complications will improve Outcome: Progressing Goal: Cardiovascular complication will be avoided Outcome: Progressing   Problem: Activity: Goal: Risk for activity intolerance will decrease Outcome: Progressing   Problem: Nutrition: Goal: Adequate nutrition will be maintained Outcome: Progressing   Problem: Coping: Goal: Level of anxiety will decrease Outcome: Progressing   Problem: Elimination: Goal: Will not experience complications related to bowel motility Outcome: Progressing Goal: Will not experience complications related to urinary retention Outcome: Progressing   Problem: Pain Managment: Goal: General experience of comfort will improve Outcome: Progressing   Problem:  Safety: Goal: Ability to remain free from injury will improve Outcome: Progressing   Problem: Skin Integrity: Goal: Risk for impaired skin integrity will decrease Outcome: Progressing   Problem: Education: Goal: Ability to describe self-care measures that may prevent or decrease complications (Diabetes Survival Skills Education) will improve Outcome: Progressing Goal: Individualized Educational Video(s) Outcome: Progressing   Problem: Coping: Goal: Ability to adjust to condition or change in health will improve Outcome: Progressing   Problem: Fluid Volume: Goal: Ability to maintain a balanced intake and output will improve Outcome: Progressing   Problem: Health Behavior/Discharge Planning: Goal: Ability to manage health-related needs will improve Outcome: Progressing   Problem: Metabolic: Goal: Ability to maintain appropriate glucose levels will improve Outcome: Progressing   Problem: Nutritional: Goal: Maintenance of adequate nutrition will improve Outcome: Progressing Goal: Progress toward achieving an optimal weight will improve Outcome: Progressing   Problem: Skin Integrity: Goal: Risk for impaired skin integrity will decrease Outcome: Progressing

## 2022-10-09 NOTE — Progress Notes (Signed)
   Heart Failure Stewardship Pharmacist Progress Note   PCP: [name], MD PCP-Cardiologist: Donato Schultz, MD    HPI:  82 yo M with PMH of CKD, T2DM, mild AS, and HTN.  Presented to the ED on 5/20 with shortness of breath and chest pain. EKG showed diffuse ST depressions with AVR elevation. Troponin >2K, BNP elevated, hypotensive, LE edema. States he is compliant with his medications. Had a positive stress test in 06/2022 and CAC was 6000. Taken to the cath lab and found 95% stenosis of left main, 90% stenosis of prox and mid LAD and 95% stenosis of obtuse marginal. LVEF ~30% by LV gram. IABP was placed for hemodynamic support. CT surgery consulted for possible CABG. Formal ECHO 5/20 showed LVEF 20-25%, global hypokinesis, G2DD, RV normal, elevated PA pressure, trivial MR, mild AR, mild AS. Ultimately, joint decision was made to not pursue surgical revascularization. IABP wean and removed on 5/21.   Current HF Medications: Diuretic: furosemide 40 mg PO daily  Prior to admission HF Medications: Diuretic: furosemide 20 mg daily PRN Beta blocker: carvedilol 6.25 mg BID ACE/ARB/ARNI: telmisartan 80 mg daily  Pertinent Lab Values: As of 5/23: Serum creatinine 1.17, BUN 17, Potassium 3.8, Sodium 140, BNP 1750, Magnesium 1.8  Vital Signs: Weight: 166 lbs Blood pressure: 100-110/60s Heart rate: 90s  I/O: -0.6L yesterday; net -6.2L  Medication Assistance / Insurance Benefits Check: Does the patient have prescription insurance?  Yes Type of insurance plan: Medicare  Outpatient Pharmacy:  Prior to admission outpatient [facility], [facility] Is the patient willing to use [facility] TOC pharmacy at discharge? Yes Is the patient willing to transition their outpatient pharmacy to utilize a [facility] outpatient pharmacy?   No    Assessment: 1. 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 NYHA class II symptoms. - Continue furosemide 40 mg PO daily. Strict I/Os and daily weights. Keep K>4  and Mg>2. - BP starting to improve, may be able to restart carvedilol 3.125 mg BID tomorrow - GDMT limited by hypotension - No SGLT2i with current UTI   Plan: 1) Medication changes recommended at this time: - Continue current regimen, add carvedilol 3.125 mg BID tomorrow  2) Patient assistance: Sherryll Burger copay $168 Marcelline Deist copay $17.97 - Jardiance copay $150.05  3)  Education  - Patient has been educated on current HF medications and potential additions to HF medication regimen - Patient verbalizes understanding that over the next few months, these medication doses may change and more medications may be added to optimize HF regimen - Patient has been educated on basic disease state pathophysiology and goals of therapy   Sharen Hones, PharmD, BCPS Heart Failure Stewardship Pharmacist Phone [phone]

## 2022-10-09 NOTE — Progress Notes (Addendum)
Rounding Note    Patient Name: [name] Steele Date of Encounter: 10/09/2022  [facility] Cardiologist: Donato Schultz, MD   Subjective   No complaints this morning, still mild burning with urination.   Inpatient Medications    Scheduled Meds:  aspirin EC  81 mg Oral Daily   atorvastatin  80 mg Oral Daily   clopidogrel  75 mg Oral Daily   enoxaparin (LOVENOX) injection  40 mg Subcutaneous Q24H   furosemide  40 mg Oral Daily   insulin aspart  0-15 Units Subcutaneous TID WC   nitrofurantoin (macrocrystal-monohydrate)  100 mg Oral Q12H   Continuous Infusions:  PRN Meds: acetaminophen, diazepam, ipratropium, morphine injection, ondansetron (ZOFRAN) IV, mouth rinse   Vital Signs    Vitals:   10/09/22 0348 10/09/22 0400 10/09/22 0600 10/09/22 0740  BP: 105/66     Pulse: 77 93 62   Resp: 18 17 15 17   Temp: 98.7 F (37.1 C)   97.8 F (36.6 C)  TempSrc: Oral   Oral  SpO2: 97% 96% 94%   Weight:      Height:        Intake/Output Summary (Last 24 hours) at 10/09/2022 0804 Last data filed at 10/09/2022 0300 Gross per 24 hour  Intake 480 ml  Output 1580 ml  Net -1100 ml      10/05/2022    7:00 AM 10/05/2022   12:33 AM 07/31/2022   10:44 AM  Last 3 Weights  Weight (lbs) 166 lb 10.7 oz 135 lb 147 lb 11.3 oz  Weight (kg) 75.6 kg 61.236 kg 67 kg      Telemetry    Sinus Rhythm, PVCs - Personally Reviewed  ECG    No new tracing   Physical Exam   GEN: No acute distress.   Neck: No JVD Cardiac: RRR, no murmurs, rubs, or gallops.  Respiratory: Clear to auscultation bilaterally. GI: Soft, nontender, non-distended  MS: No edema; No deformity. Neuro:  Nonfocal  Psych: Normal affect   Labs    High Sensitivity Troponin:   Recent Labs  Lab 10/05/22 0130 10/05/22 0248  TROPONINIHS 2,206* 2,258*     Chemistry Recent Labs  Lab 10/06/22 0302 10/07/22 0038 10/08/22 0257  NA 138 138 140  K 3.8 3.8 3.8  CL 103 103 104  CO2 26 27 28   GLUCOSE 106*  98 96  BUN 15 14 17   CREATININE 0.98 1.00 1.17  CALCIUM 8.5* 8.3* 8.8*  MG  --  1.8  --   GFRNONAA >60 >60 >60  ANIONGAP 9 8 8     Lipids No results for input(s): "CHOL", "TRIG", "HDL", "LABVLDL", "LDLCALC", "CHOLHDL" in the last 168 hours.  Hematology Recent Labs  Lab 10/07/22 0038 10/08/22 0257 10/09/22 0241  WBC 5.1 5.6 4.8  RBC 3.43* 3.51* 3.60*  HGB 10.6* 10.7* 11.3*  HCT 33.3* 33.9* 35.6*  MCV 97.1 96.6 98.9  MCH 30.9 30.5 31.4  MCHC 31.8 31.6 31.7  RDW 14.3 14.5 14.2  PLT 191 202 221   Thyroid No results for input(s): "TSH", "FREET4" in the last 168 hours.  BNP Recent Labs  Lab 10/05/22 0130  BNP 1,750.0*    DDimer No results for input(s): "DDIMER" in the last 168 hours.   Radiology    No results found.  Cardiac Studies   2D echo (10/05/2022)   IMPRESSIONS     1. Presence of apical swirling/sludge with definity contrast without  evidence of formed thrombus. Left ventricular ejection fraction, by  estimation, is 20 to 25%. The left ventricle has severely decreased  function. The left ventricle demonstrates global   hypokinesis. Left ventricular diastolic parameters are consistent with  Grade II diastolic dysfunction (pseudonormalization).   2. Right ventricular systolic function is normal. The right ventricular  size is normal. There is moderately elevated pulmonary artery systolic  pressure. The estimated right ventricular systolic pressure is 59.1 mmHg.   3. The mitral valve is grossly normal. Trivial mitral valve  regurgitation.   4. Tricuspid valve regurgitation is mild to moderate.   5. The aortic valve is calcified. There is moderate calcification of the  aortic valve. There is moderate thickening of the aortic valve. Aortic  valve regurgitation is mild. Mild aortic valve stenosis. Aortic valve  area, by VTI measures 1.11 cm. Aortic  valve mean gradient measures 10.0 mmHg. Aortic valve Vmax measures 2.06  m/s.   6. The inferior vena cava is  dilated in size with <50% respiratory  variability, suggesting right atrial pressure of 15 mmHg.      Cardiac catheterization (10/05/2022)   Conclusion   1.  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  Severe proximal and mid LAD stenoses of 90% 3.  Severe obtuse marginal stenosis of 95% 4.  Nondominant RCA, not visualized 5.  Severe segmental LV systolic dysfunction with LVEF estimated at 30% 6.  Mild aortic insufficiency assessed by supravalvular aortogram 7.  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  Successful intra-aortic balloon pump insertion   Recommendations: IV heparin, continue IABP 1:1, cardiac surgical consultation.  Difficult situation in this elderly patient, will need heart team approach to his care.   Diagnostic Dominance: Left   Patient Profile     82 y.o. male with pmh sx for CKD, DM, HTN, Mild AS, Mild dilatation of ascending aorta and CAC of 600 on CT scan who is being seen 10/05/2022 for the evaluation of NSTEMI.    Assessment & Plan    NSTEMI -- presented with chest pain and noted to have diffuse ST segment depression with elevation in aVR. Underwent cardiac cath noted above with severe LM disease 95%, 90% pLAD, 95% OM. IABP placed while in the cath lab. Evaluated by TCTS and felt not to be a good surgical candidate, patient did not want to pursue surgical revascularization.  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 Consider addition of low dose BB if BP stable tomorrow   HTN -- blood pressures remain soft but slightly improved   Dysuria -- UA +  leukocytes, urine culture pending  -- on Macrobid 100mg  BID     Mild Aortic stenosis  PT/OT evaluation pending  For questions or updates, please contact [facility] Please consult [email or web address] for contact info under        Signed, Laverda Page, NP  10/09/2022, 8:04 AM    Agree with note by Laverda Page NP-C  No chest pain or shortness of breath.  Unfortunately the patient did not ambulate with physical therapy yesterday.  Blood pressure remains soft.  Patient denies chest pain or shortness of breath.  He is on DAPT.  GDMT limited by hypotension.  Started on Macrobid for UTI.  Hopefully can be discharged tomorrow with up titration of his medications as an outpatient.  Runell Gess, M.D., FACP, [facility], [facility] [address] [place], [place]  [address]  [phone] 10/09/2022 9:02 AM

## 2022-10-09 NOTE — Evaluation (Signed)
Occupational Therapy Evaluation and Discharge Patient Details Name: Logic Hoock MRN: [id number] DOB: Mar 09, 1941 Today's Date: 10/09/2022   History of Present Illness Pt is 82 year old presented to [facility] on  10/05/22 for chest pain. Pt with NSTEMI and found to have significant CAD. Placed on IABP. IABP removed 5/21. Medical treatment of CAD. PMH - CKD, DM, HTN, gout, osteoporosis.   Clinical Impression   This 82 yo male admitted with above presents to acute OT with PLOF of being totally independent with basic ADLs and he and wife managing most of IADLs. He currently is at an overall S level from a RW level for ADLs and mobility. No further skilled OT needs, we will sign off.      Recommendations for follow up therapy are one component of a multi-disciplinary discharge planning process, led by the attending physician.  Recommendations may be updated based on patient status, additional functional criteria and insurance authorization.   Assistance Recommended at Discharge PRN  Patient can return home with the following A little help with walking and/or transfers;A little help with bathing/dressing/bathroom;Assistance with cooking/housework;Help with stairs or ramp for entrance    Functional Status Assessment  Patient has had a recent decline in their functional status and demonstrates the ability to make significant improvements in function in a reasonable and predictable amount of time. (but is close to baseline and does not need skilled OT to return to baseline)  Equipment Recommendations  BSC/3in1       Precautions / Restrictions Precautions Precautions: Fall Restrictions Weight Bearing Restrictions: No RLE Weight Bearing: Weight bearing as tolerated      Mobility Bed Mobility               General bed mobility comments: pt up in recliner upon my entry    Transfers Overall transfer level: Needs assistance Equipment used: Rolling walker (2 wheels) Transfers: Sit to/from  Stand Sit to Stand: Supervision           General transfer comment: increased time to rise; ambulated 2 times in hallway (50 feet each) one without any AD with steps smaller and slower then with RW with normal steps and more normal speed (which pt states he can normally do without AD)      Balance Overall balance assessment: Mild deficits observed, not formally tested                                         ADL either performed or assessed with clinical judgement   ADL Overall ADL's : Needs assistance/impaired Eating/Feeding: Independent;Sitting   Grooming: Supervision/safety;Standing   Upper Body Bathing: Sitting;Supervision/ safety   Lower Body Bathing: Supervison/ safety Lower Body Bathing Details (indicate cue type and reason): standing Upper Body Dressing : Supervision/safety;Sitting Upper Body Dressing Details (indicate cue type and reason): from RW level for transfer Lower Body Dressing: Supervision/safety;Sit to/from stand   Toilet Transfer: Supervision/safety;Ambulation;Rolling walker (2 wheels) Toilet Transfer Details (indicate cue type and reason): simulated recliner>50>recliner x2 Toileting- Clothing Manipulation and Hygiene: Supervision/safety;Sit to/from stand     Tub/Shower Transfer Details (indicate cue type and reason): I discussed shower safety with pt and wife with possibly them considering a shower seat.   General ADL Comments: Educated them on another type of urinal that may work better at night     Vision Patient Visual Report: No change from baseline  Pertinent Vitals/Pain Pain Assessment Pain Assessment: Faces Faces Pain Scale: Hurts a little bit Pain Location: Bil feet (L>R) Pain Descriptors / Indicators: Sore Pain Intervention(s): Limited activity within patient's tolerance, Monitored during session, Repositioned     Hand Dominance Right   Extremity/Trunk Assessment Upper Extremity Assessment Upper  Extremity Assessment: Overall WFL for tasks assessed     Communication Communication Communication: HOH   Cognition Arousal/Alertness: Awake/alert Behavior During Therapy: WFL for tasks assessed/performed Overall Cognitive Status: Within Functional Limits for tasks assessed                                       General Comments  VSS on RA            Home Living Family/patient expects to be discharged to:: Private residence Living Arrangements: Spouse/significant other Available Help at Discharge: Family;Available 24 hours/day Type of Home: House Home Access: Stairs to enter [employer] of Steps: 1 from garage, 2 at front porch Entrance Stairs-Rails: Right Home Layout: One level     Bathroom Shower/Tub: Walk-in Pensions consultant: Handicapped height     Home Equipment: Hand held shower head;Grab bars - tub/shower          Prior Functioning/Environment Prior Level of Function : Independent/Modified Independent             Mobility Comments: No assistive device          OT Problem List: Impaired balance (sitting and/or standing);Pain         OT Goals(Current goals can be found in the care plan section) Acute Rehab OT Goals Patient Stated Goal: for swelling in legs to continue to get better and go home         AM-PAC OT "6 Clicks" Daily Activity     Outcome Measure Help from another person eating meals?: None Help from another person taking care of personal grooming?: A Little Help from another person toileting, which includes using toliet, bedpan, or urinal?: A Little Help from another person bathing (including washing, rinsing, drying)?: A Little Help from another person to put on and taking off regular upper body clothing?: A Little Help from another person to put on and taking off regular lower body clothing?: A Little 6 Click Score: 19   End of Session Equipment Utilized During Treatment: Rolling walker  (2 wheels)  Activity Tolerance: Patient tolerated treatment well Patient left: in chair;with call bell/phone within reach;with chair alarm set  OT Visit Diagnosis: Unsteadiness on feet (R26.81);Other abnormalities of gait and mobility (R26.89);Pain Pain - Right/Left:  (both (R>L)) Pain - part of body:  (feet)                Time: 1478-2956 OT Time Calculation (min): 40 min Charges:  OT General Charges $OT Visit: 1 Visit OT Evaluation $OT Eval Moderate Complexity: 1 Mod OT Treatments $Self Care/Home Management : 23-37 mins  Lindon Romp OT Acute Rehabilitation Services Office [phone]    Evette Georges 10/09/2022, 1:11 PM

## 2022-10-09 NOTE — Consult Note (Signed)
Consultation Note Date: 10/09/2022   Patient Name: [name] Steele  DOB: 1940-08-16  MRN: [id number]  Age / Sex: 82 y.o., male  PCP: [name], MD Referring Physician: Tonny Bollman, MD  Reason for Consultation: Establishing goals of care  HPI/Patient Profile: 82 y.o. male  with past medical history of ILD, CKD stage 3a, DM, HTN, mild AS, osteoporosis admitted on 10/05/2022 with NSTEMI. 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   Clinical Assessment and Goals of Care: Consult received and chart review completed. I met today with [name] Steele and his wife at bedside. He is hard of hearing but able to participate in conversation with some effort and assistance from wife. We reviewed his health status and heart issues. [name] Steele is able to relay conversations with his medical team regarding his options. They understand the severity of his illness. They are clear that they would not desire surgical intervention recognizing the risks involved. They are hopeful for optimized medical management with plans to return home tomorrow. DNR was confirmed.   They are hopeful for more good time at home and to spend with family. He expresses that he does have peace when his health further declines and he reaches end of life. They tell me that they have had a good life and made many memories. 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 They have enjoyed travelling in their retirement. They have good family and friends. Wife has been a Agricultural consultant with hospice for 10 years and understands how they can help in the future. They are not interested in outpatient palliative to follow at this time.   All questions/concerns addressed. Emotional support provided.   Primary Decision Maker PATIENT    SUMMARY OF RECOMMENDATIONS   - DNR confirmed - Home with home health  Code  Status/Advance Care Planning: DNR   Symptom Management:  Per cardiology, attending   Prognosis:  Overall prognosis poor with advanced heart disease.   Discharge Planning: Home with Home Health      Primary Diagnoses: Present on Admission:  NSTEMI (non-ST elevated myocardial infarction) (HCC)   I have reviewed the medical record, interviewed the patient and family, and examined the patient. The following aspects are pertinent.  Past Medical History:  Diagnosis Date   Arthritis    Chronic kidney disease, stage 3a (HCC)    Chronic urinary tract infection    Coronary artery calcification seen on CT scan    Diabetes mellitus without complication (HCC)    Gout    Heart murmur    Hypertension    ILD (interstitial lung disease) (HCC)    by CT 01/2021   Mild aortic stenosis    Mild dilation of ascending aorta (HCC)    Osteoporosis    Social History   Socioeconomic History   Marital status: Married    Spouse name: Alice   Number of children: 2   Years of education: Not on file   Highest education level: Not on file  Occupational History  Occupation: Retired  Tobacco Use   Smoking status: Former    Packs/day: 1.00    Years: 31.00    Additional pack years: 0.00    Total pack years: 31.00    Types: Cigarettes    Quit date: 1988    Years since quitting: 36.4   Smokeless tobacco: Never  Vaping Use   Vaping Use: Never used  Substance and Sexual Activity   Alcohol use: Yes    Alcohol/week: 0.0 standard drinks of alcohol    Comment: social   Drug use: No   Sexual activity: Not Currently  Other Topics Concern   Not on file  Social History Narrative   Not on file   Social Determinants of Health   Financial Resource Strain: Low Risk  (10/07/2022)   Overall Financial Resource Strain (CARDIA)    Difficulty of Paying Living Expenses: Not very hard  Food Insecurity: No Food Insecurity (10/07/2022)   Hunger Vital Sign    Worried About Running Out of Food in the Last  Year: Never true    Ran Out of Food in the Last Year: Never true  Transportation Needs: No Transportation Needs (10/07/2022)   PRAPARE - Administrator, Civil Service (Medical): No    Lack of Transportation (Non-Medical): No  Physical Activity: Not on file  Stress: Not on file  Social Connections: Not on file   Family History  Problem Relation Age of Onset   Heart disease Sister    Scheduled Meds:  aspirin EC  81 mg Oral Daily   clopidogrel  75 mg Oral Daily   enoxaparin (LOVENOX) injection  40 mg Subcutaneous Q24H   furosemide  40 mg Oral Daily   insulin aspart  0-15 Units Subcutaneous TID WC   nitrofurantoin (macrocrystal-monohydrate)  100 mg Oral Q12H   rosuvastatin  20 mg Oral Daily   Continuous Infusions: PRN Meds:.acetaminophen, diazepam, ipratropium, morphine injection, ondansetron (ZOFRAN) IV, mouth rinse Allergies  Allergen Reactions   Erythromycin Other (See Comments)    Severe Abdominal Pain   Penicillin G Other (See Comments)   Penicillins Hives    Has patient had a PCN reaction causing immediate rash, facial/tongue/throat swelling, SOB or lightheadedness with hypotension:  Yes  Has patient had a PCN reaction causing severe rash involving mucus membranes or skin necrosis:  No Has patient had a PCN reaction that required hospitalization: No Has patient had a PCN reaction occurring within the last 10 years: No If all of the above answers are "NO", then may proceed with Cephalosporin use. Tolerated CTX 11/23 admit    Review of Systems  Constitutional:  Positive for activity change. Negative for appetite change.  Respiratory:  Negative for shortness of breath.   Cardiovascular:  Positive for leg swelling. Negative for chest pain.       Leg swelling currently improved  Neurological:  Positive for weakness.    Physical Exam Vitals and nursing note reviewed.  Constitutional:      General: He is not in acute distress.    Appearance: He is  ill-appearing.  Cardiovascular:     Rate and Rhythm: Normal rate.  Pulmonary:     Effort: No tachypnea, accessory muscle usage or respiratory distress.  Abdominal:     Palpations: Abdomen is soft.  Neurological:     Mental Status: He is alert and oriented to person, place, and time.     Vital Signs: BP 116/70 (BP Location: Left Arm)   Pulse 83   Temp 97.8  F (36.6 C) (Oral)   Resp 17   Ht 5\' 10"  (1.778 m)   Wt 75.6 kg   SpO2 97%   BMI 23.91 kg/m  Pain Scale: 0-10   Pain Score: 0-No pain   SpO2: SpO2: 97 % O2 Device:SpO2: 97 % O2 Flow Rate: .O2 Flow Rate (L/min): 4 L/min  IO: Intake/output summary:  Intake/Output Summary (Last 24 hours) at 10/09/2022 0840 Last data filed at 10/09/2022 0300 Gross per 24 hour  Intake 480 ml  Output 1580 ml  Net -1100 ml    LBM: Last BM Date : 10/08/22 Baseline Weight: Weight: 61.2 kg Most recent weight: Weight: 75.6 kg     Palliative Assessment/Data:      Time Total: 80 min  Greater than 50%  of this time was spent counseling and coordinating care related to the above assessment and plan.  Signed by: Yong Channel, NP Palliative Medicine Team Pager # [phone] (M-F 8a-5p) Team Phone # [phone] (Nights/Weekends)

## 2022-10-09 NOTE — Evaluation (Signed)
Physical Therapy Evaluation Patient Details Name: [name] Steele MRN: [id number] DOB: 19-Jul-1940 Today's Date: 10/09/2022  History of Present Illness  Pt is 82 year old presented to [facility] on  10/05/22 for chest pain. Pt with NSTEMI and found to have significant CAD. Placed on IABP. IABP removed 5/21. Medical treatment of CAD. PMH - CKD, DM, HTN, gout, osteoporosis.  Clinical Impression  Pt admitted with above diagnosis and presents to PT with functional limitations due to deficits listed below (See PT problem list). Pt needs skilled PT to maximize independence and safety to allow discharge to home with wife. Pt did well with rollator. HHPT to work on incr functional activity tolerance, balance, and mobility.          Recommendations for follow up therapy are one component of a multi-disciplinary discharge planning process, led by the attending physician.  Recommendations may be updated based on patient status, additional functional criteria and insurance authorization.  Follow Up Recommendations       Assistance Recommended at Discharge Intermittent Supervision/Assistance  Patient can return home with the following  Help with stairs or ramp for entrance;Assistance with cooking/housework    Equipment Recommendations Rollator (4 wheels)  Recommendations for Other Services       Functional Status Assessment Patient has had a recent decline in their functional status and demonstrates the ability to make significant improvements in function in a reasonable and predictable amount of time.     Precautions / Restrictions Precautions Precautions: Fall Restrictions Weight Bearing Restrictions: No      Mobility  Bed Mobility Overal bed mobility: Modified Independent             General bed mobility comments: Incr time    Transfers Overall transfer level: Needs assistance Equipment used: Rollator (4 wheels) Transfers: Sit to/from Stand Sit to Stand: Min guard, Modified  independent (Device/Increase time)           General transfer comment: Min guard from bed for safety with incr time to rise. Modified independent from commode using grab bar    Ambulation/Gait Ambulation/Gait assistance: Supervision Gait Distance (Feet): 450 Feet Assistive device: Rollator (4 wheels) Gait Pattern/deviations: Step-through pattern, Decreased stride length, Trunk flexed Gait velocity: decr Gait velocity interpretation: 1.31 - 2.62 ft/sec, indicative of limited community ambulator   General Gait Details: Supervision for initial safety  Stairs            Wheelchair Mobility    Modified Rankin (Stroke Patients Only)       Balance Overall balance assessment: Needs assistance Sitting-balance support: No upper extremity supported, Feet supported Sitting balance-Leahy Scale: Good     Standing balance support: No upper extremity supported, During functional activity Standing balance-Leahy Scale: Fair                               Pertinent Vitals/Pain Pain Assessment Pain Assessment: Faces Faces Pain Scale: Hurts a little bit Pain Location: Bil feet Pain Descriptors / Indicators: Tightness, Sore Pain Intervention(s): Limited activity within patient's tolerance, Monitored during session, Repositioned    Home Living Family/patient expects to be discharged to:: Private residence Living Arrangements: Spouse/significant other Available Help at Discharge: Family Type of Home: House Home Access: Stairs to enter Entrance Stairs-Rails: Right Entrance Stairs-Number of Steps: 2   Home Layout: One level Home Equipment: None      Prior Function Prior Level of Function : Independent/Modified Independent  Mobility Comments: No assistive device       Hand Dominance   Dominant Hand: Right    Extremity/Trunk Assessment   Upper Extremity Assessment Upper Extremity Assessment: Defer to OT evaluation    Lower Extremity  Assessment Lower Extremity Assessment: Generalized weakness       Communication   Communication: HOH  Cognition Arousal/Alertness: Awake/alert Behavior During Therapy: WFL for tasks assessed/performed Overall Cognitive Status: Within Functional Limits for tasks assessed                                          General Comments General comments (skin integrity, edema, etc.): VSS on RA    Exercises     Assessment/Plan    PT Assessment Patient needs continued PT services  PT Problem List Decreased strength;Decreased balance;Decreased mobility       PT Treatment Interventions DME instruction;Gait training;Stair training;Functional mobility training;Therapeutic activities;Therapeutic exercise;Balance training;Patient/family education    PT Goals (Current goals can be found in the Care Plan section)  Acute Rehab PT Goals Patient Stated Goal: return home PT Goal Formulation: With patient Time For Goal Achievement: 10/16/22 Potential to Achieve Goals: Good    Frequency Min 1X/week     Co-evaluation               AM-PAC PT "6 Clicks" Mobility  Outcome Measure Help needed turning from your back to your side while in a flat bed without using bedrails?: None Help needed moving from lying on your back to sitting on the side of a flat bed without using bedrails?: None Help needed moving to and from a bed to a chair (including a wheelchair)?: A Little Help needed standing up from a chair using your arms (e.g., wheelchair or bedside chair)?: A Little Help needed to walk in hospital room?: A Little Help needed climbing 3-5 steps with a railing? : A Little 6 Click Score: 20    End of Session   Activity Tolerance: Patient tolerated treatment well Patient left: in chair;with call bell/phone within reach;with chair alarm set Nurse Communication: Mobility status PT Visit Diagnosis: Other abnormalities of gait and mobility (R26.89);Muscle weakness (generalized)  (M62.81)    Time: 1610-9604 PT Time Calculation (min) (ACUTE ONLY): 30 min   Charges:   PT Evaluation $PT Eval Moderate Complexity: 1 Mod PT Treatments $Gait Training: 8-22 mins        [facility] PT Acute Rehabilitation Services Office [phone]   Angelina Ok [facility] 10/09/2022, 11:13 AM

## 2022-10-09 NOTE — Progress Notes (Signed)
   10/09/22 1610  Spiritual Encounters  Type of Visit Follow up  Care provided to: Pt and family  Referral source Other (comment) (chaplain commitment to follow up post ED)  Reason for visit Routine spiritual support  OnCall Visit No  Spiritual Framework  Presenting Themes Meaning/purpose/sources of inspiration;Community and relationships  Interventions  Spiritual Care Interventions Made Established relationship of care and support;Compassionate presence;Reflective listening;Prayer  Intervention Outcomes  Outcomes Connection to spiritual care;Awareness around self/spiritual resourses;Connection to values and goals of care;Awareness of health;Awareness of support   Chaplain Tiburcio Pea provided spiritual and emotional support for patient and family. Chaplain followed up with patient because she had committed to a follow up when visiting the patient in the ed.   Arlyce Dice, Chaplain Resident [phone]

## 2022-10-10 ENCOUNTER — Encounter (HOSPITAL_COMMUNITY): Payer: Self-pay | Admitting: Cardiovascular Disease

## 2022-10-10 ENCOUNTER — Other Ambulatory Visit: Payer: Self-pay | Admitting: Physician Assistant

## 2022-10-10 ENCOUNTER — Other Ambulatory Visit (HOSPITAL_COMMUNITY): Payer: Self-pay

## 2022-10-10 DIAGNOSIS — I5043 Acute on chronic combined systolic (congestive) and diastolic (congestive) heart failure: Secondary | ICD-10-CM | POA: Diagnosis not present

## 2022-10-10 DIAGNOSIS — I350 Nonrheumatic aortic (valve) stenosis: Secondary | ICD-10-CM

## 2022-10-10 DIAGNOSIS — I25110 Atherosclerotic heart disease of native coronary artery with unstable angina pectoris: Secondary | ICD-10-CM

## 2022-10-10 DIAGNOSIS — I255 Ischemic cardiomyopathy: Secondary | ICD-10-CM

## 2022-10-10 DIAGNOSIS — I5021 Acute systolic (congestive) heart failure: Secondary | ICD-10-CM

## 2022-10-10 DIAGNOSIS — I5020 Unspecified systolic (congestive) heart failure: Secondary | ICD-10-CM

## 2022-10-10 DIAGNOSIS — I214 Non-ST elevation (NSTEMI) myocardial infarction: Secondary | ICD-10-CM | POA: Diagnosis not present

## 2022-10-10 HISTORY — DX: Unspecified systolic (congestive) heart failure: I50.20

## 2022-10-10 HISTORY — DX: Ischemic cardiomyopathy: I25.5

## 2022-10-10 LAB — GLUCOSE, CAPILLARY: Glucose-Capillary: 121 mg/dL — ABNORMAL HIGH (ref 70–99)

## 2022-10-10 LAB — URINE CULTURE: Culture: >=100000 — AB

## 2022-10-10 MED ORDER — NITROFURANTOIN MONOHYD MACRO 100 MG PO CAPS
100.0000 mg | ORAL_CAPSULE | Freq: Two times a day (BID) | ORAL | 0 refills | Status: DC
  Filled 2022-10-10: qty 10, 5d supply, fill #0

## 2022-10-10 MED ORDER — POTASSIUM CHLORIDE CRYS ER 20 MEQ PO TBCR
20.0000 meq | EXTENDED_RELEASE_TABLET | Freq: Every day | ORAL | 3 refills | Status: DC
  Filled 2022-10-10: qty 30, 30d supply, fill #0

## 2022-10-10 MED ORDER — CLOPIDOGREL BISULFATE 75 MG PO TABS
75.0000 mg | ORAL_TABLET | Freq: Every day | ORAL | 3 refills | Status: DC
  Filled 2022-10-10: qty 30, 30d supply, fill #0

## 2022-10-10 MED ORDER — CARVEDILOL 3.125 MG PO TABS
3.1250 mg | ORAL_TABLET | Freq: Two times a day (BID) | ORAL | 3 refills | Status: DC
  Filled 2022-10-10: qty 60, 30d supply, fill #0

## 2022-10-10 MED ORDER — POTASSIUM CHLORIDE CRYS ER 20 MEQ PO TBCR
20.0000 meq | EXTENDED_RELEASE_TABLET | Freq: Every day | ORAL | Status: DC
  Administered 2022-10-10: 20 meq via ORAL
  Filled 2022-10-10: qty 1

## 2022-10-10 MED ORDER — CARVEDILOL 3.125 MG PO TABS
3.1250 mg | ORAL_TABLET | Freq: Two times a day (BID) | ORAL | Status: DC
  Administered 2022-10-10: 3.125 mg via ORAL
  Filled 2022-10-10: qty 1

## 2022-10-10 MED ORDER — ROSUVASTATIN CALCIUM 20 MG PO TABS
20.0000 mg | ORAL_TABLET | Freq: Every day | ORAL | 3 refills | Status: DC
  Filled 2022-10-10: qty 30, 30d supply, fill #0

## 2022-10-10 MED ORDER — FUROSEMIDE 40 MG PO TABS
40.0000 mg | ORAL_TABLET | Freq: Every day | ORAL | 3 refills | Status: DC
  Filled 2022-10-10: qty 30, 30d supply, fill #0

## 2022-10-10 NOTE — Discharge Instructions (Signed)
Please go to the [facility] office at [address], [address] for labs on Thursday, Oct 15, 2022.  Call our office [phone]) if you do not have refills on your medications. The hospital pharmacy should transfer your medications to your pharmacy. But in case there are any medications that do not get transferred, let us know so that we can arrange refills.

## 2022-10-10 NOTE — Discharge Summary (Signed)
Discharge Summary    Patient ID: [name] Steele MRN: [id number]; DOB: 05/14/1941  Admit date: 10/05/2022 Discharge date: 10/10/2022  PCP:  [name], MD   [facility] Providers Cardiologist:  Donato Schultz, MD        Discharge Diagnoses    Principal Problem:   NSTEMI (non-ST elevated myocardial infarction) [facility]) Active Problems:   Acute HFrEF (heart failure with reduced ejection fraction) (HCC)   Ischemic cardiomyopathy   Type 2 diabetes mellitus without complication (HCC)   Mild aortic stenosis   E. coli UTI  Diagnostic Studies/Procedures    RIGHT/LEFT HEART CATH AND CORONARY ANGIOGRAPHY 10/05/2022 Narrative 1.  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  Severe proximal and mid LAD stenoses of 90% 3.  Severe obtuse marginal stenosis of 95% 4.  Nondominant RCA, not visualized 5.  Severe segmental LV systolic dysfunction with LVEF estimated at 30% 6.  Mild aortic insufficiency assessed by supravalvular aortogram 7.  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  Successful intra-aortic balloon pump insertion  Recommendations: IV heparin, continue IABP 1:1, cardiac surgical consultation.  Difficult situation in this elderly patient, will need heart team approach to his care.      ECHO COMPLETE WITH IMAGING ENHANCING AGENT 10/05/2022 IMPRESSIONS 1. Presence of apical swirling/sludge with definity contrast without evidence of formed thrombus. Left ventricular ejection fraction, by estimation, is 20 to 25%. The left ventricle has severely decreased function. The left ventricle demonstrates global hypokinesis. Left ventricular diastolic parameters are consistent with Grade II diastolic dysfunction (pseudonormalization). 2. Right ventricular systolic function is normal. The right ventricular size is normal. There is moderately elevated pulmonary artery systolic pressure. The estimated right ventricular systolic pressure is 59.1  mmHg. 3. The mitral valve is grossly normal. Trivial mitral valve regurgitation. 4. Tricuspid valve regurgitation is mild to moderate. 5. The aortic valve is calcified. There is moderate calcification of the aortic valve. There is moderate thickening of the aortic valve. Aortic valve regurgitation is mild. Mild aortic valve stenosis. Aortic valve area, by VTI measures 1.11 cm. Aortic valve mean gradient measures 10.0 mmHg. Aortic valve Vmax measures 2.06 m/s. 6. The inferior vena cava is dilated in size with <50% respiratory variability, suggesting right atrial pressure of 15 mmHg.  LV Wall Scoring: The apical lateral segment, apical septal segment, apical anterior segment, and apex are akinetic. The anterior wall, antero-lateral wall, anterior septum, mid inferoseptal segment, apical inferior segment, and basal inferoseptal segment are hypokinetic. The inferior wall and posterior wall are normal. _____________   History of Present Illness     [name] Steele is a 82 y.o. male with a hx of coronary artery disease, mild aortic stenosis, chronic kidney disease, diabetes mellitus, hypertension, mild dilation of the aorta, ILD (Interstitial Lung Disease) who presented to [facility] on 10/05/22 with sudden onset of chest pain, diffuse ST depression on EKG and hs-Troponin >2000 c/w a NSTEMI. The patient had evidence of cardiogenic shock with volume overload and hypotension. Emergent cardiac catheterization was arranged.  Hospital Course     Consultants: Cardiothoracic Surgery, Palliative Care   The patient was brought emergently to the cardiac catheterization lab. Cardiac catheterization demonstrated critical LM stenosis, severe proximal LAD and OM stenosis and EF 30. IABP was inserted and CT surgery was consulted. The patient was seen by Dr. Cliffton Asters who recommended CABG. However, the patient and his wife were concerned that he would not recover from surgery and opted for medical  management only. He was  made DNR (Do Not Resuscitate). He was weaned off the IABP and antiplatelet Rx was initiated. Echocardiogram demonstrated EF 20-25, moderately elevated RVSP, mild aortic stenosis. He was diuresed with IV Lasix. GDMT initiation was limited due to soft BP. He developed dysuria 10/08/22 and UCx was positive for E.coli. He was started on Macrobid. He was evaluated by PT with recommendations for HHPT. The patient was evaluated by Yong Channel, NP with Palliative Care for Goals of Care discussion. The patient confirmed DNR (Do Not Resuscitate) status and plans to go home with home health. He diuresed 6.5 L since admission. His BP improved somewhat by the date of discharge. He was evaluated by Dr. Royann Shivers this morning. He will be started on Carvedilol 3.125 mg twice daily in addition to Furosemide 40 mg once daily, K+ 20 mEq once daily. He is felt to be stable for discharge to home. He will need early TOC follow up in the CHF clinic. He has an appt 6/7 and a follow up with Dr. Anne Fu 6/28. A BMET will be arrange in 1 week.      Did the patient have an acute coronary syndrome (MI, NSTEMI, STEMI, etc) this admission?:  Yes                               AHA/ACC Clinical Performance & Quality Measures: Aspirin prescribed? - Yes ADP Receptor Inhibitor (Plavix/Clopidogrel, Brilinta/Ticagrelor or Effient/Prasugrel) prescribed (includes medically managed patients)? - Yes Beta Blocker prescribed? - Yes High Intensity Statin (Lipitor 40-80mg  or Crestor 20-40mg ) prescribed? - Yes EF assessed during THIS hospitalization? - Yes For EF <40%, was ACEI/ARB prescribed? - No - Reason:  Low BP For EF <40%, Aldosterone Antagonist (Spironolactone or Eplerenone) prescribed? - No - Reason:  Low BP Cardiac Rehab Phase II ordered (including medically managed patients)? - Yes   The patient will be scheduled for a TOC CHF Clinic follow up appointment in 14 days.     _____________  Discharge Vitals Blood  pressure 109/73, pulse 72, temperature 97.9 F (36.6 C), temperature source Oral, resp. rate 16, height 5\' 10"  (1.778 m), weight 75.6 kg, SpO2 94 %.  Filed Weights   10/05/22 0033 10/05/22 0700  Weight: 61.2 kg 75.6 kg    Labs & Radiologic Studies    CBC Recent Labs    10/08/22 0257 10/09/22 0241  WBC 5.6 4.8  HGB 10.7* 11.3*  HCT 33.9* 35.6*  MCV 96.6 98.9  PLT 202 221   Basic Metabolic Panel Recent Labs    16/10/96 0257  NA 140  K 3.8  CL 104  CO2 28  GLUCOSE 96  BUN 17  CREATININE 1.17  CALCIUM 8.8*    High Sensitivity Troponin:   Recent Labs  Lab 10/05/22 0130 10/05/22 0248  TROPONINIHS 2,206* 2,258*    _____________  DG CHEST PORT 1 VIEW  Result Date: 10/06/2022 CLINICAL DATA:  Intra-aortic balloon pump. EXAM: PORTABLE CHEST 1 VIEW COMPARISON:  10/05/2022 FINDINGS: The intra-aortic balloon pump marker is approximately 5.1 cm below the aortic arch. In stable cardiomediastinal contours. Aortic atherosclerotic calcifications. Unchanged bilateral pleural effusions and mild interstitial edema. No new findings. IMPRESSION: No significant change in position of aortic balloon pump. Persistent small pleural effusions and mild interstitial edema. Electronically Signed   By: Signa Kell M.D.   On: 10/06/2022 08:25   DG CHEST PORT 1 VIEW  Result Date: 10/05/2022 CLINICAL DATA:  Management of intra-aortic balloon pump. EXAM: PORTABLE  CHEST 1 VIEW COMPARISON:  10/05/2022 FINDINGS: Heart size is normal accounting for patient position. Intra-aortic balloon pump has been placed, with radiopaque marker approximately 4.8 centimeters below the aortic arch. Position of the radiopaque marker is slightly LATERAL, consistent with tortuous aorta. There are small bilateral pleural effusions. Diffuse bilateral interstitial prominence is consistent with mild edema. No new consolidations. IMPRESSION: 1. Interval placement of intra-aortic balloon pump. 2. Small bilateral pleural effusions and  mild interstitial edema. These results were called by telephone at the time of interpretation on 10/05/2022 at 10:55 am to provider [facility] , who verbally acknowledged these results. Electronically Signed   By: Norva Pavlov M.D.   On: 10/05/2022 10:55   DG Chest Portable 1 View  Result Date: 10/05/2022 CLINICAL DATA:  Altered mental status. EXAM: PORTABLE CHEST 1 VIEW COMPARISON:  Chest radiograph dated 02/06/2022 and CT dated 04/06/2022. FINDINGS: There is diffuse interstitial coarsening and nodularity involving the mid to lower lung field bilaterally. Probable small bilateral pleural effusions and bibasilar atelectasis or infiltrate. No pneumothorax. Top-normal cardiac size. Atherosclerotic calcification of the aorta. No acute osseous pathology. IMPRESSION: 1. Small bilateral pleural effusions and bibasilar atelectasis or infiltrate. 2. Diffuse interstitial coarsening and nodularity. Electronically Signed   By: Elgie Collard M.D.   On: 10/05/2022 02:08     Disposition   Pt is being discharged home today in good condition.  Follow-up Plans & Appointments     Follow-up Information     [facility]. Go in 16 day(s).   Specialty: Cardiology Why: Hospital follow up 10/23/2022 @ 3 pm PLEASE bring a currentmedication list to appointment FREE valet parking, Entrance C, off [address] [employer] information: [address] [id number] mc [place] [address] [phone]        Care, [facility] Follow up.   Specialty: Home Health Services Why: Registered Nurse, Physical Therapy, Occupational Therapy-office to call with visit times. Contact information: [address] [place] [address] [phone]         [employer] Follow up.   Why: Rollator, Bedside Com[employer]. Contact information: Located in: [employer] Address: [address], [place], [place] [address] Phone: [phone]               Discharge Instructions     (HEART FAILURE PATIENTS) Call MD:  Anytime you have any of the following symptoms: 1) 3 pound weight gain in 24 hours or 5 pounds in 1 week 2) shortness of breath, with or without a dry hacking cough 3) swelling in the hands, feet or stomach 4) if you have to sleep on extra pillows at night in order to breathe.   Complete by: As directed    Amb Referral to Cardiac Rehabilitation   Complete by: As directed    Diagnosis:  NSTEMI Heart Failure (see criteria below if ordering Phase II)     Heart Failure Type: Chronic Systolic & Diastolic   After initial evaluation and assessments completed: Virtual Based Care may be provided alone or in conjunction with Phase 2 Cardiac Rehab based on patient barriers.: Yes   Intensive Cardiac Rehabilitation (ICR) [facility] location only OR Traditional Cardiac Rehabilitation (TCR) *If criteria for ICR are not met will enroll in TCR [facility] only): Yes   Diet - low sodium heart healthy   Complete by: As directed    Discharge wound care:   Complete by: As directed    Call [phone] for any swelling,  bleeding, bruising or fever.   Driving Restrictions   Complete by: As directed    No driving   Increase activity slowly   Complete by: As directed    Lifting restrictions   Complete by: As directed    No lifting over 5 lbs        Discharge Medications   Allergies as of 10/10/2022       Reactions   Erythromycin Other (See Comments)   Severe Abdominal Pain   Penicillin G Other (See Comments)   Penicillins Hives   Has patient had a PCN reaction causing immediate rash, facial/tongue/throat swelling, SOB or lightheadedness with hypotension:  Yes  Has patient had a PCN reaction causing severe rash involving mucus membranes or skin necrosis:  No Has patient had a PCN reaction that required hospitalization: No Has patient had a PCN reaction occurring within the last 10 years: No If all of the above answers  are "NO", then may proceed with Cephalosporin use. Tolerated CTX 11/23 admit        Medication List     STOP taking these medications    amLODipine 5 MG tablet Commonly known as: NORVASC   telmisartan 80 MG tablet Commonly known as: MICARDIS       TAKE these medications    aspirin EC 81 MG tablet Take 1 tablet (81 mg total) by mouth daily. Swallow whole.   carvedilol 3.125 MG tablet Commonly known as: COREG Take 1 tablet (3.125 mg total) by mouth 2 (two) times daily with a meal. What changed:  medication strength how much to take when to take this   clopidogrel 75 MG tablet Commonly known as: PLAVIX Take 1 tablet (75 mg total) by mouth daily.   colchicine 0.6 MG tablet Take 0.6 mg by mouth daily as needed (gout).   CRANBERRY PO Take 1 capsule by mouth daily.   furosemide 40 MG tablet Commonly known as: LASIX Take 1 tablet (40 mg total) by mouth daily. What changed:  medication strength how much to take when to take this reasons to take this   ipratropium 0.06 % nasal spray Commonly known as: ATROVENT Place 2 sprays into both nostrils 3 (three) times daily as needed for rhinitis.   metFORMIN 750 MG 24 hr tablet Commonly known as: GLUCOPHAGE-XR Take 750 mg by mouth daily after breakfast.   nitrofurantoin (macrocrystal-monohydrate) 100 MG capsule Commonly known as: MACROBID Take 1 capsule (100 mg total) by mouth every 12 (twelve) hours.   potassium chloride SA 20 MEQ tablet Commonly known as: KLOR-CON M Take 1 tablet (20 mEq total) by mouth daily.   rosuvastatin 20 MG tablet Commonly known as: CRESTOR Take 1 tablet (20 mg total) by mouth daily. What changed:  medication strength how much to take               Durable Medical Equipment  (From admission, onward)           Start     Ordered   10/10/22 1101  DME Walker  Once       Question Answer Comment  Walker: With 5 Inch Wheels   Patient needs a walker to treat with the  following condition NSTEMI (non-ST elevated myocardial infarction) (HCC)   Patient needs a walker to treat with the following condition HFrEF (heart failure with reduced ejection fraction) (HCC)      10/10/22 1102   10/07/22 1535  For home use only DME 4 wheeled rolling walker with seat  Once  Question:  Patient needs a walker to treat with the following condition  Answer:  Heart failure (HCC)   10/07/22 1535   10/07/22 1535  For home use only DME 3 n 1  Once        10/07/22 1535   10/07/22 1535  For home use only DME Hospital bed  Once       Question Answer Comment  Length of Need Lifetime   Patient has (list medical condition): Heart Failure   The above medical condition requires: Patient requires the ability to reposition frequently   Head must be elevated greater than: 45 degrees   Bed type Semi-electric   Trapeze Bar Yes   Support Surface: Gel Overlay      10/07/22 1535              Discharge Care Instructions  (From admission, onward)           Start     Ordered   10/10/22 0000  Discharge wound care:       Comments: Call [phone] for any swelling, bleeding, bruising or fever.   10/10/22 1102               Outstanding Labs/Studies   BMET 1 week (10/15/22)  Duration of Discharge Encounter   Greater than 30 minutes including physician time.  Signed, Tereso Newcomer, PA-C 10/10/2022, 11:06 AM

## 2022-10-10 NOTE — Hospital Course (Signed)
[name] Steele is a 82 y.o. male with a hx of coronary artery disease, mild aortic stenosis, chronic kidney disease, diabetes mellitus, hypertension, mild dilation of the aorta, ILD (Interstitial Lung Disease) who presented to [facility] on 10/05/22 with sudden onset of chest pain, diffuse ST depression on EKG and hs-Troponin >2000 c/w a NSTEMI. The patient had evidence of cardiogenic shock with volume overload and hypotension. Emergent cardiac catheterization was arranged.  The patient was brought emergently to the cardiac catheterization lab. Cardiac catheterization demonstrated critical LM stenosis, severe proximal LAD and OM stenosis and EF 30. IABP was inserted and CT surgery was consulted. The patient was seen by Dr. Cliffton Asters who recommended CABG. However, the patient and his wife were concerned that he would not recover from surgery and opted for medical management only. He was made DNR (Do Not Resuscitate). He was weaned off the IABP and antiplatelet Rx was initiated. Echocardiogram demonstrated EF 20-25, moderately elevated RVSP, mild aortic stenosis. He was diuresed with IV Lasix. GDMT initiation was limited due to soft BP. He developed dysuria 10/08/22 and UCx was positive for E.coli. He was started on Macrobid. He was evaluated by PT with recommendations for HHPT. The patient was evaluated by Yong Channel, NP with Palliative Care for Goals of Care discussion. The patient confirmed DNR (Do Not Resuscitate) status and plans to go home with home health. ***    RIGHT/LEFT HEART CATH AND CORONARY ANGIOGRAPHY 10/05/2022 Narrative 1.  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  Severe proximal and mid LAD stenoses of 90% 3.  Severe obtuse marginal stenosis of 95% 4.  Nondominant RCA, not visualized 5.  Severe segmental LV systolic dysfunction with LVEF estimated at 30% 6.  Mild aortic insufficiency assessed by supravalvular aortogram 7.  Ectatic, calcified thoracoabdominal aorta, suspected  right common iliac aneurysm 8.  Successful intra-aortic balloon pump insertion  Recommendations: IV heparin, continue IABP 1:1, cardiac surgical consultation.  Difficult situation in this elderly patient, will need heart team approach to his care.      ECHO COMPLETE WITH IMAGING ENHANCING AGENT 10/05/2022 IMPRESSIONS 1. Presence of apical swirling/sludge with definity contrast without evidence of formed thrombus. Left ventricular ejection fraction, by estimation, is 20 to 25%. The left ventricle has severely decreased function. The left ventricle demonstrates global hypokinesis. Left ventricular diastolic parameters are consistent with Grade II diastolic dysfunction (pseudonormalization). 2. Right ventricular systolic function is normal. The right ventricular size is normal. There is moderately elevated pulmonary artery systolic pressure. The estimated right ventricular systolic pressure is 59.1 mmHg. 3. The mitral valve is grossly normal. Trivial mitral valve regurgitation. 4. Tricuspid valve regurgitation is mild to moderate. 5. The aortic valve is calcified. There is moderate calcification of the aortic valve. There is moderate thickening of the aortic valve. Aortic valve regurgitation is mild. Mild aortic valve stenosis. Aortic valve area, by VTI measures 1.11 cm. Aortic valve mean gradient measures 10.0 mmHg. Aortic valve Vmax measures 2.06 m/s. 6. The inferior vena cava is dilated in size with <50% respiratory variability, suggesting right atrial pressure of 15 mmHg.  LV Wall Scoring: The apical lateral segment, apical septal segment, apical anterior segment, and apex are akinetic. The anterior wall, antero-lateral wall, anterior septum, mid inferoseptal segment, apical inferior segment, and basal inferoseptal segment are hypokinetic. The inferior wall and posterior wall are normal.

## 2022-10-10 NOTE — Progress Notes (Signed)
Mobility Specialist Progress Note    10/10/22 1150  Mobility  Activity Ambulated with assistance in hallway  Level of Assistance Contact guard assist, steadying assist  Assistive Device Front wheel walker  Distance Ambulated (ft) 470 ft  Activity Response Tolerated well  Mobility Referral Yes  $Mobility charge 1 Mobility  Mobility Specialist Start Time (ACUTE ONLY) 1133  Mobility Specialist Stop Time (ACUTE ONLY) 1149  Mobility Specialist Time Calculation (min) (ACUTE ONLY) 16 min   Pt received in chair and agreeable. No complaints on walk. Returned to chair with call bell in reach.   Alleghany Nation Mobility Specialist  Please Neurosurgeon or Rehab Office at [phone]

## 2022-10-10 NOTE — Progress Notes (Signed)
Rounding Note    Patient Name: [name] Steele Date of Encounter: 10/10/2022  [facility] Cardiologist: Donato Schultz, MD   Subjective   Had a good night's rest.  Denies orthopnea.  Edema is gone.   He feels confident in his decision to pursue conservative management. At diuresis 6.5 L since admission.  Has not been weighed since 10/05/2022  Inpatient Medications    Scheduled Meds:  aspirin EC  81 mg Oral Daily   clopidogrel  75 mg Oral Daily   enoxaparin (LOVENOX) injection  40 mg Subcutaneous Q24H   furosemide  40 mg Oral Daily   insulin aspart  0-15 Units Subcutaneous TID WC   nitrofurantoin (macrocrystal-monohydrate)  100 mg Oral Q12H   rosuvastatin  20 mg Oral Daily   Continuous Infusions:  PRN Meds: acetaminophen, diazepam, ipratropium, morphine injection, ondansetron (ZOFRAN) IV, mouth rinse   Vital Signs    Vitals:   10/10/22 0200 10/10/22 0400 10/10/22 0538 10/10/22 0600  BP:   109/73   Pulse: 74 77 90 72  Resp: 17 16 14 16   Temp:   97.9 F (36.6 C)   TempSrc:   Oral   SpO2: 97% 97% 99% 94%  Weight:      Height:        Intake/Output Summary (Last 24 hours) at 10/10/2022 0928 Last data filed at 10/10/2022 0500 Gross per 24 hour  Intake 480 ml  Output 850 ml  Net -370 ml      10/05/2022    7:00 AM 10/05/2022   12:33 AM 07/31/2022   10:44 AM  Last 3 Weights  Weight (lbs) 166 lb 10.7 oz 135 lb 147 lb 11.3 oz  Weight (kg) 75.6 kg 61.236 kg 67 kg      Telemetry    Sinus rhythm with mild first-degree AV block, rare PVCs, no VT.- Personally Reviewed  ECG    No new tracing- Personally Reviewed  Physical Exam  Elderly, frail-appearing.  Most of dentulous. GEN: No acute distress.   Neck: 2-3 cm JVD Cardiac: RRR, no murmurs, rubs, or gallops.  No groin hematoma/bleeding. Respiratory: Clear to auscultation bilaterally. GI: Soft, nontender, non-distended  MS: No edema; No deformity. Neuro:  Nonfocal  Psych: Normal affect   Labs     High Sensitivity Troponin:   Recent Labs  Lab 10/05/22 0130 10/05/22 0248  TROPONINIHS 2,206* 2,258*     Chemistry Recent Labs  Lab 10/06/22 0302 10/07/22 0038 10/08/22 0257  NA 138 138 140  K 3.8 3.8 3.8  CL 103 103 104  CO2 26 27 28   GLUCOSE 106* 98 96  BUN 15 14 17   CREATININE 0.98 1.00 1.17  CALCIUM 8.5* 8.3* 8.8*  MG  --  1.8  --   GFRNONAA >60 >60 >60  ANIONGAP 9 8 8     Lipids No results for input(s): "CHOL", "TRIG", "HDL", "LABVLDL", "LDLCALC", "CHOLHDL" in the last 168 hours.  Hematology Recent Labs  Lab 10/07/22 0038 10/08/22 0257 10/09/22 0241  WBC 5.1 5.6 4.8  RBC 3.43* 3.51* 3.60*  HGB 10.6* 10.7* 11.3*  HCT 33.3* 33.9* 35.6*  MCV 97.1 96.6 98.9  MCH 30.9 30.5 31.4  MCHC 31.8 31.6 31.7  RDW 14.3 14.5 14.2  PLT 191 202 221   Thyroid No results for input(s): "TSH", "FREET4" in the last 168 hours.  BNP Recent Labs  Lab 10/05/22 0130  BNP 1,750.0*    DDimer No results for input(s): "DDIMER" in the last 168 hours.   Radiology  No results found.  Cardiac Studies    2D echo (10/05/2022)   IMPRESSIONS     1. Presence of apical swirling/sludge with definity contrast without  evidence of formed thrombus. Left ventricular ejection fraction, by  estimation, is 20 to 25%. The left ventricle has severely decreased  function. The left ventricle demonstrates global   hypokinesis. Left ventricular diastolic parameters are consistent with  Grade II diastolic dysfunction (pseudonormalization).   2. Right ventricular systolic function is normal. The right ventricular  size is normal. There is moderately elevated pulmonary artery systolic  pressure. The estimated right ventricular systolic pressure is 59.1 mmHg.   3. The mitral valve is grossly normal. Trivial mitral valve  regurgitation.   4. Tricuspid valve regurgitation is mild to moderate.   5. The aortic valve is calcified. There is moderate calcification of the  aortic valve. There is  moderate thickening of the aortic valve. Aortic  valve regurgitation is mild. Mild aortic valve stenosis. Aortic valve  area, by VTI measures 1.11 cm. Aortic  valve mean gradient measures 10.0 mmHg. Aortic valve Vmax measures 2.06  m/s.   6. The inferior vena cava is dilated in size with <50% respiratory  variability, suggesting right atrial pressure of 15 mmHg.      Cardiac catheterization (10/05/2022)   Conclusion   1.  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  Severe proximal and mid LAD stenoses of 90% 3.  Severe obtuse marginal stenosis of 95% 4.  Nondominant RCA, not visualized 5.  Severe segmental LV systolic dysfunction with LVEF estimated at 30% 6.  Mild aortic insufficiency assessed by supravalvular aortogram 7.  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  Successful intra-aortic balloon pump insertion   Recommendations: IV heparin, continue IABP 1:1, cardiac surgical consultation.  Difficult situation in this elderly patient, will need heart team approach to his care.   Diagnostic Dominance: Left    Patient Profile     82 y.o. male with pmh sx for CKD, DM, HTN, Mild AS, Mild dilatation of ascending aorta and CAC of 600 on CT scan who is being seen 10/05/2022 for the evaluation of NSTEMI.   Patient Profile     82 y.o. male presenting with a small non-STEMI and found to have severe multivessel coronary artery disease including a 95% stenosis of the left main coronary artery, severe left ventricular dysfunction with estimated EF 20-25% with global hypokinesis and apical akinesis.  After evaluation by CV surgery, both care team and patient have decided on conservative care is the best path forward.  Assessment & Plan    Appears well compensated and clinically euvolemic today.  He does not have any angina. Maintained slight negative fluid balance once switched to oral diuretics. Limited options for guideline directed medical therapy  for his cardiomyopathy due to relative hypotension, but blood pressure today does allow addition of a low-dose of beta-blocker. On aspirin, clopidogrel, statin.  Will discharge home today with continued dual antiplatelet therapy and high-dose statin as well as carvedilol 3.125 mg twice daily and furosemide 40 mg once daily and potassium chloride 20 mill equivalents once daily.  Will need early follow-up in transition of care heart failure clinic and follow-up electrolytes and renal function assessment.Marland Kitchen  He lives with his wife, who is in good health and is a retired Engineer, civil (consulting).  He tells me that his daughter-in-law is a Publishing rights manager.  For questions or updates, please contact [facility] Please consult [email or web address] for contact  info under        Signed, Thurmon Fair, MD  10/10/2022, 9:28 AM

## 2022-10-12 DIAGNOSIS — I214 Non-ST elevation (NSTEMI) myocardial infarction: Secondary | ICD-10-CM | POA: Diagnosis not present

## 2022-10-12 DIAGNOSIS — E1122 Type 2 diabetes mellitus with diabetic chronic kidney disease: Secondary | ICD-10-CM | POA: Diagnosis not present

## 2022-10-12 DIAGNOSIS — I2510 Atherosclerotic heart disease of native coronary artery without angina pectoris: Secondary | ICD-10-CM | POA: Diagnosis not present

## 2022-10-12 DIAGNOSIS — Z9181 History of falling: Secondary | ICD-10-CM | POA: Diagnosis not present

## 2022-10-12 DIAGNOSIS — Z87891 Personal history of nicotine dependence: Secondary | ICD-10-CM | POA: Diagnosis not present

## 2022-10-12 DIAGNOSIS — I77812 Thoracoabdominal aortic ectasia: Secondary | ICD-10-CM | POA: Diagnosis not present

## 2022-10-12 DIAGNOSIS — M1990 Unspecified osteoarthritis, unspecified site: Secondary | ICD-10-CM | POA: Diagnosis not present

## 2022-10-12 DIAGNOSIS — M109 Gout, unspecified: Secondary | ICD-10-CM | POA: Diagnosis not present

## 2022-10-12 DIAGNOSIS — Z7984 Long term (current) use of oral hypoglycemic drugs: Secondary | ICD-10-CM | POA: Diagnosis not present

## 2022-10-12 DIAGNOSIS — B9620 Unspecified Escherichia coli [E. coli] as the cause of diseases classified elsewhere: Secondary | ICD-10-CM | POA: Diagnosis not present

## 2022-10-12 DIAGNOSIS — N1831 Chronic kidney disease, stage 3a: Secondary | ICD-10-CM | POA: Diagnosis not present

## 2022-10-12 DIAGNOSIS — I5021 Acute systolic (congestive) heart failure: Secondary | ICD-10-CM | POA: Diagnosis not present

## 2022-10-12 DIAGNOSIS — I130 Hypertensive heart and chronic kidney disease with heart failure and stage 1 through stage 4 chronic kidney disease, or unspecified chronic kidney disease: Secondary | ICD-10-CM | POA: Diagnosis not present

## 2022-10-12 DIAGNOSIS — Z7902 Long term (current) use of antithrombotics/antiplatelets: Secondary | ICD-10-CM | POA: Diagnosis not present

## 2022-10-12 DIAGNOSIS — M810 Age-related osteoporosis without current pathological fracture: Secondary | ICD-10-CM | POA: Diagnosis not present

## 2022-10-12 DIAGNOSIS — J849 Interstitial pulmonary disease, unspecified: Secondary | ICD-10-CM | POA: Diagnosis not present

## 2022-10-12 DIAGNOSIS — N390 Urinary tract infection, site not specified: Secondary | ICD-10-CM | POA: Diagnosis not present

## 2022-10-12 DIAGNOSIS — I255 Ischemic cardiomyopathy: Secondary | ICD-10-CM | POA: Diagnosis not present

## 2022-10-12 DIAGNOSIS — R011 Cardiac murmur, unspecified: Secondary | ICD-10-CM | POA: Diagnosis not present

## 2022-10-12 DIAGNOSIS — I083 Combined rheumatic disorders of mitral, aortic and tricuspid valves: Secondary | ICD-10-CM | POA: Diagnosis not present

## 2022-10-12 DIAGNOSIS — Z7982 Long term (current) use of aspirin: Secondary | ICD-10-CM | POA: Diagnosis not present

## 2022-10-13 ENCOUNTER — Inpatient Hospital Stay (HOSPITAL_COMMUNITY)
Admission: EM | Admit: 2022-10-13 | Discharge: 2022-10-16 | DRG: 280 | Disposition: A | Discharge status: Home Health | Payer: Medicare Other | Attending: Cardiology | Admitting: Cardiology

## 2022-10-13 ENCOUNTER — Emergency Department (HOSPITAL_COMMUNITY): Payer: Medicare Other

## 2022-10-13 ENCOUNTER — Other Ambulatory Visit: Payer: Self-pay

## 2022-10-13 ENCOUNTER — Encounter (HOSPITAL_COMMUNITY): Payer: Self-pay

## 2022-10-13 DIAGNOSIS — I25119 Atherosclerotic heart disease of native coronary artery with unspecified angina pectoris: Secondary | ICD-10-CM | POA: Diagnosis not present

## 2022-10-13 DIAGNOSIS — I10 Essential (primary) hypertension: Secondary | ICD-10-CM | POA: Diagnosis not present

## 2022-10-13 DIAGNOSIS — I5043 Acute on chronic combined systolic (congestive) and diastolic (congestive) heart failure: Secondary | ICD-10-CM | POA: Diagnosis not present

## 2022-10-13 DIAGNOSIS — I255 Ischemic cardiomyopathy: Secondary | ICD-10-CM | POA: Diagnosis not present

## 2022-10-13 DIAGNOSIS — Z7984 Long term (current) use of oral hypoglycemic drugs: Secondary | ICD-10-CM | POA: Diagnosis not present

## 2022-10-13 DIAGNOSIS — Z881 Allergy status to other antibiotic agents status: Secondary | ICD-10-CM

## 2022-10-13 DIAGNOSIS — I21A1 Myocardial infarction type 2: Secondary | ICD-10-CM | POA: Diagnosis not present

## 2022-10-13 DIAGNOSIS — M1990 Unspecified osteoarthritis, unspecified site: Secondary | ICD-10-CM | POA: Diagnosis present

## 2022-10-13 DIAGNOSIS — Z7982 Long term (current) use of aspirin: Secondary | ICD-10-CM | POA: Diagnosis not present

## 2022-10-13 DIAGNOSIS — Z880 Allergy status to penicillin: Secondary | ICD-10-CM | POA: Diagnosis not present

## 2022-10-13 DIAGNOSIS — I060 Rheumatic aortic stenosis: Secondary | ICD-10-CM | POA: Diagnosis present

## 2022-10-13 DIAGNOSIS — J9601 Acute respiratory failure with hypoxia: Secondary | ICD-10-CM | POA: Diagnosis not present

## 2022-10-13 DIAGNOSIS — Z79899 Other long term (current) drug therapy: Secondary | ICD-10-CM | POA: Diagnosis not present

## 2022-10-13 DIAGNOSIS — Z87891 Personal history of nicotine dependence: Secondary | ICD-10-CM

## 2022-10-13 DIAGNOSIS — Z66 Do not resuscitate: Secondary | ICD-10-CM | POA: Diagnosis present

## 2022-10-13 DIAGNOSIS — J90 Pleural effusion, not elsewhere classified: Secondary | ICD-10-CM | POA: Diagnosis not present

## 2022-10-13 DIAGNOSIS — Z8249 Family history of ischemic heart disease and other diseases of the circulatory system: Secondary | ICD-10-CM

## 2022-10-13 DIAGNOSIS — R609 Edema, unspecified: Secondary | ICD-10-CM | POA: Diagnosis not present

## 2022-10-13 DIAGNOSIS — I130 Hypertensive heart and chronic kidney disease with heart failure and stage 1 through stage 4 chronic kidney disease, or unspecified chronic kidney disease: Principal | ICD-10-CM | POA: Diagnosis present

## 2022-10-13 DIAGNOSIS — I350 Nonrheumatic aortic (valve) stenosis: Secondary | ICD-10-CM

## 2022-10-13 DIAGNOSIS — M810 Age-related osteoporosis without current pathological fracture: Secondary | ICD-10-CM | POA: Diagnosis present

## 2022-10-13 DIAGNOSIS — M109 Gout, unspecified: Secondary | ICD-10-CM | POA: Diagnosis present

## 2022-10-13 DIAGNOSIS — E1122 Type 2 diabetes mellitus with diabetic chronic kidney disease: Secondary | ICD-10-CM | POA: Diagnosis present

## 2022-10-13 DIAGNOSIS — I509 Heart failure, unspecified: Secondary | ICD-10-CM | POA: Insufficient documentation

## 2022-10-13 DIAGNOSIS — I5021 Acute systolic (congestive) heart failure: Secondary | ICD-10-CM

## 2022-10-13 DIAGNOSIS — I214 Non-ST elevation (NSTEMI) myocardial infarction: Secondary | ICD-10-CM | POA: Diagnosis present

## 2022-10-13 DIAGNOSIS — R0602 Shortness of breath: Secondary | ICD-10-CM | POA: Diagnosis not present

## 2022-10-13 DIAGNOSIS — I5023 Acute on chronic systolic (congestive) heart failure: Principal | ICD-10-CM | POA: Diagnosis present

## 2022-10-13 DIAGNOSIS — J9621 Acute and chronic respiratory failure with hypoxia: Secondary | ICD-10-CM | POA: Diagnosis not present

## 2022-10-13 DIAGNOSIS — N179 Acute kidney failure, unspecified: Secondary | ICD-10-CM | POA: Diagnosis not present

## 2022-10-13 DIAGNOSIS — E785 Hyperlipidemia, unspecified: Secondary | ICD-10-CM | POA: Diagnosis present

## 2022-10-13 DIAGNOSIS — R001 Bradycardia, unspecified: Secondary | ICD-10-CM | POA: Diagnosis not present

## 2022-10-13 DIAGNOSIS — J849 Interstitial pulmonary disease, unspecified: Secondary | ICD-10-CM | POA: Diagnosis not present

## 2022-10-13 DIAGNOSIS — I110 Hypertensive heart disease with heart failure: Secondary | ICD-10-CM | POA: Diagnosis not present

## 2022-10-13 DIAGNOSIS — Z7902 Long term (current) use of antithrombotics/antiplatelets: Secondary | ICD-10-CM

## 2022-10-13 DIAGNOSIS — E118 Type 2 diabetes mellitus with unspecified complications: Secondary | ICD-10-CM | POA: Diagnosis present

## 2022-10-13 DIAGNOSIS — I959 Hypotension, unspecified: Secondary | ICD-10-CM | POA: Diagnosis not present

## 2022-10-13 DIAGNOSIS — Z87440 Personal history of urinary (tract) infections: Secondary | ICD-10-CM

## 2022-10-13 DIAGNOSIS — N1831 Chronic kidney disease, stage 3a: Secondary | ICD-10-CM | POA: Diagnosis present

## 2022-10-13 DIAGNOSIS — N183 Chronic kidney disease, stage 3 unspecified: Secondary | ICD-10-CM | POA: Diagnosis present

## 2022-10-13 DIAGNOSIS — I25118 Atherosclerotic heart disease of native coronary artery with other forms of angina pectoris: Secondary | ICD-10-CM | POA: Diagnosis not present

## 2022-10-13 DIAGNOSIS — I2510 Atherosclerotic heart disease of native coronary artery without angina pectoris: Secondary | ICD-10-CM | POA: Diagnosis not present

## 2022-10-13 DIAGNOSIS — Z515 Encounter for palliative care: Secondary | ICD-10-CM | POA: Diagnosis not present

## 2022-10-13 DIAGNOSIS — E119 Type 2 diabetes mellitus without complications: Secondary | ICD-10-CM

## 2022-10-13 DIAGNOSIS — Z9660 Presence of unspecified orthopedic joint implant: Secondary | ICD-10-CM | POA: Diagnosis present

## 2022-10-13 DIAGNOSIS — N1832 Chronic kidney disease, stage 3b: Secondary | ICD-10-CM | POA: Diagnosis not present

## 2022-10-13 DIAGNOSIS — Z7189 Other specified counseling: Secondary | ICD-10-CM | POA: Diagnosis not present

## 2022-10-13 LAB — CBC WITH DIFFERENTIAL/PLATELET
Abs Immature Granulocytes: 0.02 10*3/uL (ref 0.00–0.07)
Basophils Absolute: 0 10*3/uL (ref 0.0–0.1)
Basophils Relative: 1 %
Eosinophils Absolute: 0.2 10*3/uL (ref 0.0–0.5)
Eosinophils Relative: 3 %
HCT: 38.4 % — ABNORMAL LOW (ref 39.0–52.0)
Hemoglobin: 11.7 g/dL — ABNORMAL LOW (ref 13.0–17.0)
Immature Granulocytes: 0 %
Lymphocytes Relative: 24 %
Lymphs Abs: 1.8 10*3/uL (ref 0.7–4.0)
MCH: 31.1 pg (ref 26.0–34.0)
MCHC: 30.5 g/dL (ref 30.0–36.0)
MCV: 102.1 fL — ABNORMAL HIGH (ref 80.0–100.0)
Monocytes Absolute: 0.8 10*3/uL (ref 0.1–1.0)
Monocytes Relative: 10 %
Neutro Abs: 4.8 10*3/uL (ref 1.7–7.7)
Neutrophils Relative %: 62 %
Platelets: 306 10*3/uL (ref 150–400)
RBC: 3.76 MIL/uL — ABNORMAL LOW (ref 4.22–5.81)
RDW: 13.9 % (ref 11.5–15.5)
WBC: 7.7 10*3/uL (ref 4.0–10.5)
nRBC: 0 % (ref 0.0–0.2)

## 2022-10-13 LAB — COMPREHENSIVE METABOLIC PANEL
ALT: 24 U/L (ref 0–44)
AST: 67 U/L — ABNORMAL HIGH (ref 15–41)
Albumin: 3.2 g/dL — ABNORMAL LOW (ref 3.5–5.0)
Alkaline Phosphatase: 52 U/L (ref 38–126)
Anion gap: 11 (ref 5–15)
BUN: 19 mg/dL (ref 8–23)
CO2: 24 mmol/L (ref 22–32)
Calcium: 8.2 mg/dL — ABNORMAL LOW (ref 8.9–10.3)
Chloride: 102 mmol/L (ref 98–111)
Creatinine, Ser: 1.35 mg/dL — ABNORMAL HIGH (ref 0.61–1.24)
GFR, Estimated: 52 mL/min — ABNORMAL LOW (ref >60)
Glucose, Bld: 93 mg/dL (ref 70–99)
Potassium: 4.3 mmol/L (ref 3.5–5.1)
Sodium: 137 mmol/L (ref 135–145)
Total Bilirubin: 0.4 mg/dL (ref 0.3–1.2)
Total Protein: 6 g/dL — ABNORMAL LOW (ref 6.5–8.1)

## 2022-10-13 LAB — MAGNESIUM: Magnesium: 1.8 mg/dL (ref 1.7–2.4)

## 2022-10-13 LAB — TROPONIN I (HIGH SENSITIVITY)
Troponin I (High Sensitivity): 7238 ng/L (ref <18)
Troponin I (High Sensitivity): 5770 ng/L (ref <18)

## 2022-10-13 LAB — GLUCOSE, CAPILLARY
Glucose-Capillary: 71 mg/dL (ref 70–99)
Glucose-Capillary: 85 mg/dL (ref 70–99)

## 2022-10-13 LAB — BRAIN NATRIURETIC PEPTIDE: B Natriuretic Peptide: 1954.1 pg/mL — ABNORMAL HIGH (ref 0.0–100.0)

## 2022-10-13 MED ORDER — NITROGLYCERIN 0.4 MG SL SUBL: 0.4000 mg | SUBLINGUAL_TABLET | SUBLINGUAL | Status: DC | PRN

## 2022-10-13 MED ORDER — ROSUVASTATIN CALCIUM 20 MG PO TABS
20.0000 mg | ORAL_TABLET | Freq: Every day | ORAL | Status: DC
  Administered 2022-10-13 – 2022-10-16 (×4): 20 mg via ORAL
  Filled 2022-10-13 (×5): qty 1

## 2022-10-13 MED ORDER — INSULIN ASPART 100 UNIT/ML IJ SOLN
0.0000 [IU] | Freq: Three times a day (TID) | INTRAMUSCULAR | Status: DC
  Administered 2022-10-15: 1 [IU] via SUBCUTANEOUS

## 2022-10-13 MED ORDER — ASPIRIN 81 MG PO TBEC
81.0000 mg | DELAYED_RELEASE_TABLET | Freq: Every day | ORAL | Status: DC
  Administered 2022-10-13 – 2022-10-16 (×4): 81 mg via ORAL
  Filled 2022-10-13 (×5): qty 1

## 2022-10-13 MED ORDER — FUROSEMIDE 10 MG/ML IJ SOLN
40.0000 mg | Freq: Every day | INTRAMUSCULAR | Status: DC
  Administered 2022-10-15: 40 mg via INTRAVENOUS
  Filled 2022-10-13 (×2): qty 4

## 2022-10-13 MED ORDER — ACETAMINOPHEN 325 MG PO TABS
650.0000 mg | ORAL_TABLET | ORAL | Status: DC | PRN
  Administered 2022-10-14: 650 mg via ORAL
  Filled 2022-10-13: qty 2

## 2022-10-13 MED ORDER — ONDANSETRON HCL 4 MG/2ML IJ SOLN
4.0000 mg | Freq: Four times a day (QID) | INTRAMUSCULAR | Status: DC | PRN
  Administered 2022-10-14: 4 mg via INTRAVENOUS
  Filled 2022-10-13: qty 2

## 2022-10-13 MED ORDER — HEPARIN SODIUM (PORCINE) 5000 UNIT/ML IJ SOLN
5000.0000 [IU] | Freq: Three times a day (TID) | INTRAMUSCULAR | Status: DC
  Administered 2022-10-13 – 2022-10-16 (×9): 5000 [IU] via SUBCUTANEOUS
  Filled 2022-10-13 (×10): qty 1

## 2022-10-13 MED ORDER — FUROSEMIDE 10 MG/ML IJ SOLN
40.0000 mg | Freq: Once | INTRAMUSCULAR | Status: AC
  Administered 2022-10-13: 40 mg via INTRAVENOUS
  Filled 2022-10-13: qty 4

## 2022-10-13 MED ORDER — METOPROLOL SUCCINATE ER 25 MG PO TB24
12.5000 mg | ORAL_TABLET | Freq: Every day | ORAL | Status: DC
  Filled 2022-10-13 (×2): qty 1

## 2022-10-13 MED ORDER — CLOPIDOGREL BISULFATE 75 MG PO TABS
75.0000 mg | ORAL_TABLET | Freq: Every day | ORAL | Status: DC
  Administered 2022-10-13 – 2022-10-16 (×4): 75 mg via ORAL
  Filled 2022-10-13 (×5): qty 1

## 2022-10-13 MED ORDER — MAGNESIUM SULFATE 2 GM/50ML IV SOLN
2.0000 g | Freq: Once | INTRAVENOUS | Status: AC
  Administered 2022-10-13: 2 g via INTRAVENOUS
  Filled 2022-10-13: qty 50

## 2022-10-13 NOTE — ED Notes (Signed)
ED TO INPATIENT HANDOFF REPORT  ED Nurse Name and Phone #: Percival Spanish [phone]  S Name/Age/Gender [name] Steele 82 y.o. male Room/Bed: 016C/016C  Code Status   Code Status: DNR  Home/SNF/Other Home Patient oriented to: self, place, time, and situation Is this baseline? Yes   Triage Complete: Triage complete  Chief Complaint Heart failure (HCC) [I50.9]  Triage Note Pt arrived via [facility] from home for increased SOB. When fire got there pt was 83% on RA. Fire placed pt on NRB. Pt was 90% on RA. EMS placed pt on 3L 02 per [place] and Sa02 came up to 98%. Per EMS, pt has 3+ -4+ edema of LE bilat. Pt is A&Ox4. Pt is tachypneic.   Allergies Allergies  Allergen Reactions   Erythromycin Other (See Comments)    Severe Abdominal Pain   Penicillins Hives    Level of Care/Admitting Diagnosis ED Disposition     ED Disposition  Admit   Condition  --   Comment  Hospital Area: [facility] [[id number]]  Level of Care: Telemetry Cardiac [[id number]]  May place patient in observation at [facility] or [facility] if equivalent level of care is available:: No  Covid Evaluation: Asymptomatic - no recent exposure (last 10 days) testing not required  Diagnosis: Heart failure [facility]) [[id number]]  Admitting Physician: Arlyss Gandy  Attending Physician: Marykay Lex [[id number]]          B Medical/Surgery History Past Medical History:  Diagnosis Date   Arthritis    CAD (coronary artery disease) 10/05/2022   Cath 09/2022: 3v CAD, critical LM stenosis - Pt opted for med Rx only>>DNR   Chronic kidney disease, stage 3a (HCC)    Chronic urinary tract infection    Coronary artery calcification seen on CT scan    Diabetes mellitus without complication (HCC)    Gout    Heart murmur    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   S/p non-STEMI 09/2022>> three-vessel and critical left main stenosis; patient opted for medical management // TTE 09/2022: EF 20-25,  GR 2 DD, RVSP 59.1, trivial MR, mild-moderate TR, mild aortic stenosis, mean gradient 10, V-max 206 cm/s, RAP 15   History of non-ST elevation myocardial infarction (NSTEMI) 10/05/2022   Hypertension    ILD (interstitial lung disease) (HCC)    by CT 01/2021   Ischemic cardiomyopathy 10/10/2022   Mild aortic stenosis    Mild dilation of ascending aorta (HCC)    Osteoporosis    Past Surgical History:  Procedure Laterality Date   AORTIC ARCH ANGIOGRAPHY N/A 10/05/2022   Procedure: AORTIC ARCH ANGIOGRAPHY;  Surgeon: Tonny Bollman, MD;  Location: [facility] INVASIVE CV LAB;  Service: Cardiovascular;  Laterality: N/A;   CHOLECYSTECTOMY N/A 07/04/2018   Procedure: LAPAROSCOPIC CHOLECYSTECTOMY WITH INTRAOPERATIVE CHOLANGIOGRAM;  Surgeon: Abigail Miyamoto, MD;  Location: [facility] ORS;  Service: General;  Laterality: N/A;   ERCP N/A 07/05/2018   Procedure: ENDOSCOPIC RETROGRADE CHOLANGIOPANCREATOGRAPHY (ERCP);  Surgeon: Vida Rigger, MD;  Location: [facility] END[facility]COPY;  Service: Endoscopy;  Laterality: N/A;   IABP INSERTION N/A 10/05/2022   Procedure: IABP Insertion;  Surgeon: Tonny Bollman, MD;  Location: [facility] INVASIVE CV LAB;  Service: Cardiovascular;  Laterality: N/A;   INGUINAL HERNIA REPAIR Right 07/31/2022   Procedure: OPEN RIGHT INGUINAL HERNIA REPAIR WITH MESH;  Surgeon: Berna Bue, MD;  Location: [facility] ORS;  Service: General;  Laterality: Right;   JOINT REPLACEMENT Bilateral    REMOVAL OF STONES  07/05/2018  Procedure: REMOVAL OF STONES;  Surgeon: Vida Rigger, MD;  Location: [facility] ENDOSCOPY;  Service: Endoscopy;;   RIGHT/LEFT HEART CATH AND CORONARY ANGIOGRAPHY N/A 10/05/2022   Procedure: RIGHT/LEFT HEART CATH AND CORONARY ANGIOGRAPHY;  Surgeon: Tonny Bollman, MD;  Location: [facility] INVASIVE CV LAB;  Service: Cardiovascular;  Laterality: N/A;   SPHINCTEROTOMY  07/05/2018   Procedure: SPHINCTEROTOMY;  Surgeon: Vida Rigger, MD;  Location: [facility] ENDOSCOPY;  Service: Endoscopy;;     A IV Location/Drains/Wounds Patient  Lines/Drains/Airways Status     Active Line/Drains/Airways     Name Placement date Placement time Site Days   Peripheral IV 10/13/22 18 G Left Antecubital 10/13/22  --  Antecubital  less than 1   Peripheral IV 10/13/22 20 G Right Antecubital 10/13/22  1222  Antecubital  less than 1   External Urinary Catheter 10/13/22  1109  --  less than 1            Intake/Output Last 24 hours  Intake/Output Summary (Last 24 hours) at 10/13/2022 1442 Last data filed at 10/13/2022 1242 Gross per 24 hour  Intake 50 ml  Output --  Net 50 ml    Labs/Imaging Results for orders placed or performed during the hospital encounter of 10/13/22 (from the past 48 hour(s))  Comprehensive metabolic panel     Status: Abnormal   Collection Time: 10/13/22  9:55 AM  Result Value Ref Range   Sodium 137 135 - 145 mmol/L   Potassium 4.3 3.5 - 5.1 mmol/L   Chloride 102 98 - 111 mmol/L   CO2 24 22 - 32 mmol/L   Glucose, Bld 93 70 - 99 mg/dL    Comment: Glucose reference range applies only to samples taken after fasting for at least 8 hours.   BUN 19 8 - 23 mg/dL   Creatinine, Ser 1.61 (H) 0.61 - 1.24 mg/dL   Calcium 8.2 (L) 8.9 - 10.3 mg/dL   Total Protein 6.0 (L) 6.5 - 8.1 g/dL   Albumin 3.2 (L) 3.5 - 5.0 g/dL   AST 67 (H) 15 - 41 U/L   ALT 24 0 - 44 U/L   Alkaline Phosphatase 52 38 - 126 U/L   Total Bilirubin 0.4 0.3 - 1.2 mg/dL   GFR, Estimated 52 (L) >60 mL/min    Comment: (NOTE) Calculated using the CKD-EPI Creatinine Equation (2021)    Anion gap 11 5 - 15    Comment: Performed at [facility], [address], [place], [place] [address]  Brain natriuretic peptide     Status: Abnormal   Collection Time: 10/13/22  9:55 AM  Result Value Ref Range   B Natriuretic Peptide 1,954.1 (H) 0.0 - 100.0 pg/mL    Comment: Performed at [facility], [address] [place], [place] [address]  CBC with Differential     Status: Abnormal   Collection Time: 10/13/22  9:55 AM  Result Value Ref Range    WBC 7.7 4.0 - 10.5 K/uL   RBC 3.76 (L) 4.22 - 5.81 MIL/uL   Hemoglobin 11.7 (L) 13.0 - 17.0 g/dL   HCT 11.9 (L) 14.7 - 82.9 %   MCV 102.1 (H) 80.0 - 100.0 fL   MCH 31.1 26.0 - 34.0 pg   MCHC 30.5 30.0 - 36.0 g/dL   RDW 56.2 13.0 - 86.5 %   Platelets 306 150 - 400 K/uL   nRBC 0.0 0.0 - 0.2 %   Neutrophils Relative % 62 %   Neutro Abs 4.8 1.7 -  7.7 K/uL   Lymphocytes Relative 24 %   Lymphs Abs 1.8 0.7 - 4.0 K/uL   Monocytes Relative 10 %   Monocytes Absolute 0.8 0.1 - 1.0 K/uL   Eosinophils Relative 3 %   Eosinophils Absolute 0.2 0.0 - 0.5 K/uL   Basophils Relative 1 %   Basophils Absolute 0.0 0.0 - 0.1 K/uL   Immature Granulocytes 0 %   Abs Immature Granulocytes 0.02 0.00 - 0.07 K/uL    Comment: Performed at [facility], [address], [place], [place] [address]  Troponin I (High Sensitivity)     Status: Abnormal   Collection Time: 10/13/22  9:55 AM  Result Value Ref Range   Troponin I (High Sensitivity) 7,238 (HH) <18 ng/L    Comment: CRITICAL RESULT CALLED TO, READ BACK BY AND VERIFIED WITH A.Rasheema Truluck,RN 1126 10/13/22 CLARK,S (NOTE) Elevated high sensitivity troponin I (hsTnI) values and significant  changes across serial measurements may suggest ACS but many other  chronic and acute conditions are known to elevate hsTnI results.  Refer to the "Links" section for chest pain algorithms and additional  guidance. Performed at [facility], [address] [place], [place] [address]   Magnesium     Status: None   Collection Time: 10/13/22  9:55 AM  Result Value Ref Range   Magnesium 1.8 1.7 - 2.4 mg/dL    Comment: Performed at [facility], [address] [place], [place] [address]  Troponin I (High Sensitivity)     Status: Abnormal   Collection Time: 10/13/22 12:20 PM  Result Value Ref Range   Troponin I (High Sensitivity) 5,770 (HH) <18 ng/L    Comment: CRITICAL VALUE NOTED. VALUE IS CONSISTENT WITH PREVIOUSLY REPORTED/CALLED VALUE (NOTE) Elevated high  sensitivity troponin I (hsTnI) values and significant  changes across serial measurements may suggest ACS but many other  chronic and acute conditions are known to elevate hsTnI results.  Refer to the "Links" section for chest pain algorithms and additional  guidance. Performed at [facility], [address], [place], [place] [address]    DG Chest Port 1 View  Result Date: 10/13/2022 CLINICAL DATA:  Shortness of breath. EXAM: PORTABLE CHEST 1 VIEW COMPARISON:  Oct 06, 2022. FINDINGS: Stable cardiomegaly. Tip of intra-aortic balloon catheter is not seen currently. Stable bilateral lung opacities are noted, right greater than left, concerning for pulmonary edema or possibly multifocal pneumonia. Small bilateral pleural effusions are noted. Bony thorax is unremarkable. IMPRESSION: Stable bilateral lung opacities and pleural effusions as described above. Electronically Signed   By: Lupita Raider M.D.   On: 10/13/2022 11:02    Pending Labs Unresulted Labs (From admission, onward)     Start     Ordered   Pending  CBC  (heparin)  Once,   R       Comments: Baseline for heparin therapy IF NOT ALREADY DRAWN.  Notify MD if PLT < 100 K.    Pending   Pending  Creatinine, serum  (heparin)  Once,   R       Comments: Baseline for heparin therapy IF NOT ALREADY DRAWN.    Pending   Pending  Basic metabolic panel  Tomorrow morning,   R        Pending   Signed and Held  Basic metabolic panel  Tomorrow morning,   R        Signed and Held  Vitals/Pain Today's Vitals   10/13/22 1300 10/13/22 1315 10/13/22 1403 10/13/22 1404  BP:   91/66   Pulse: 71 82 69   Resp: 14 (!) 24 14   Temp:    (!) 97.4 F (36.3 C)  TempSrc:    Oral  SpO2: 98% 91% 98%   Weight:      Height:      PainSc:        Isolation Precautions No active isolations  Medications Medications  aspirin EC tablet 81 mg (81 mg Oral Given 10/13/22 1440)  clopidogrel (PLAVIX) tablet 75 mg (75 mg Oral Given  10/13/22 1440)  furosemide (LASIX) injection 40 mg (40 mg Intravenous Given 10/13/22 1136)  magnesium sulfate IVPB 2 g 50 mL (0 g Intravenous Stopped 10/13/22 1242)    Mobility walks with device     Focused Assessments Cardiac Assessment Handoff:  Cardiac Rhythm: Normal sinus rhythm Lab Results  Component Value Date   CKTOTAL 152 10/06/2008   CKMB 2.9 10/06/2008   TROPONINI <0.03 12/15/2017   Lab Results  Component Value Date   DDIMER 0.97 (H) 12/15/2017   Does the Patient currently have chest pain? No    R Recommendations: See Admitting Provider Note  Report given to:   Additional Notes:

## 2022-10-13 NOTE — ED Triage Notes (Signed)
Pt arrived via [facility] from home for increased SOB. When fire got there pt was 83% on RA. Fire placed pt on NRB. Pt was 90% on RA. EMS placed pt on 3L 02 per [place] and Sa02 came up to 98%. Per EMS, pt has 3+ -4+ edema of LE bilat. Pt is A&Ox4. Pt is tachypneic.

## 2022-10-13 NOTE — H&P (Addendum)
Cardiology H&P   Patient ID: Kerion Wiebelhaus MRN: [id number]; DOB: 1940/09/10  Admit date: 10/13/2022 Date of Consult: 10/13/2022  PCP:  [name], MD   [facility] Providers Cardiologist:  Donato Schultz, MD        Patient Profile:   Christof Feinberg is a 82 y.o. male with a hx of CAD (95% L main, 90% prox and mid LAD treated medically), HFrEF, mild aortic stenosis, CKD, DM, and Interstitial lung disease who is being seen 10/13/2022 for the evaluation of heart failure and elevated troponin at the request of Dr. Suezanne Jacquet.  History of Present Illness:   Mr. Nethercott is an 82 year old male with the above history who was recently admitted on 10/05/22 after NSTEMI and new HFrEF with LVEF of 20-25%. Cath that admission showed 95% L main, 90% prox and mid LAD. IABP was placed. Discussions were had for CABG vs high risk PCI vs medical management and it was decided to continue medical management due to surgical and PCI recovery concerns. He was diuresed and discharged 10/10/22 on lasix 40mg  daily, aspirin, 81mg  daily and coreg 3.125mg  BID. Therapy was limited by blood pressure.   Palliative care was consulted during previous admission to discuss goals of care. His DNR status was confirmed. They were not interested in outpatient palliative care at that time.   He presented to the emergency room via EMS today 10/13/22 with complaints of shortness of breath. SpO2 was 83% upon EMS arrival which was improved with supplemental oxygen. He reports feeling well post discharge and his family reports increased appetite.He started to feel short of breath last night before he went to bed and then waking up severely short of breath and asked his family to call EMS. He did not have any chest pain. His symptoms improved with oxygen. Labs on admission: Creatinine 1.35, Alt 67, Albumin 3.2. Troponin 7238, BNP 1954.1.    He received 40mg  IV lasix in the ED with about out. He states he feels better after  diuresis.    Past Medical History:  Diagnosis Date   Arthritis    CAD (coronary artery disease) 10/05/2022   Cath 09/2022: 3v CAD, critical LM stenosis - Pt opted for med Rx only>>DNR   Chronic kidney disease, stage 3a (HCC)    Chronic urinary tract infection    Coronary artery calcification seen on CT scan    Diabetes mellitus without complication (HCC)    Gout    Heart murmur    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   S/p non-STEMI 09/2022>> three-vessel and critical left main stenosis; patient opted for medical management // TTE 09/2022: EF 20-25, GR 2 DD, RVSP 59.1, trivial MR, mild-moderate TR, mild aortic stenosis, mean gradient 10, V-max 206 cm/s, RAP 15   History of non-ST elevation myocardial infarction (NSTEMI) 10/05/2022   Hypertension    ILD (interstitial lung disease) (HCC)    by CT 01/2021   Ischemic cardiomyopathy 10/10/2022   Mild aortic stenosis    Mild dilation of ascending aorta (HCC)    Osteoporosis     Past Surgical History:  Procedure Laterality Date   AORTIC ARCH ANGIOGRAPHY N/A 10/05/2022   Procedure: AORTIC ARCH ANGIOGRAPHY;  Surgeon: Tonny Bollman, MD;  Location: [facility] INVASIVE CV LAB;  Service: Cardiovascular;  Laterality: N/A;   CHOLECYSTECTOMY N/A 07/04/2018   Procedure: LAPAROSCOPIC CHOLECYSTECTOMY WITH INTRAOPERATIVE CHOLANGIOGRAM;  Surgeon: Abigail Miyamoto, MD;  Location: [facility] ORS;  Service: General;  Laterality: N/A;  ERCP N/A 07/05/2018   Procedure: ENDOSCOPIC RETROGRADE CHOLANGIOPANCREATOGRAPHY (ERCP);  Surgeon: Vida Rigger, MD;  Location: [facility];  Service: Endoscopy;  Laterality: N/A;   IABP INSERTION N/A 10/05/2022   Procedure: IABP Insertion;  Surgeon: Tonny Bollman, MD;  Location: [facility] INVASIVE CV LAB;  Service: Cardiovascular;  Laterality: N/A;   INGUINAL HERNIA REPAIR Right 07/31/2022   Procedure: OPEN RIGHT INGUINAL HERNIA REPAIR WITH MESH;  Surgeon: Berna Bue, MD;  Location: [facility] ORS;  Service: General;   Laterality: Right;   JOINT REPLACEMENT Bilateral    REMOVAL OF STONES  07/05/2018   Procedure: REMOVAL OF STONES;  Surgeon: Vida Rigger, MD;  Location: [facility] ENDOSCOPY;  Service: Endoscopy;;   RIGHT/LEFT HEART CATH AND CORONARY ANGIOGRAPHY N/A 10/05/2022   Procedure: RIGHT/LEFT HEART CATH AND CORONARY ANGIOGRAPHY;  Surgeon: Tonny Bollman, MD;  Location: [facility] INVASIVE CV LAB;  Service: Cardiovascular;  Laterality: N/A;   SPHINCTEROTOMY  07/05/2018   Procedure: SPHINCTEROTOMY;  Surgeon: Vida Rigger, MD;  Location: [facility] ENDOSCOPY;  Service: Endoscopy;;     Home Medications:  Prior to Admission medications   Medication Sig Start Date End Date Taking? Authorizing Provider  aspirin EC 81 MG tablet Take 1 tablet (81 mg total) by mouth daily. Swallow whole. 04/14/21  Yes Jake Bathe, MD  carvedilol (COREG) 3.125 MG tablet Take 1 tablet (3.125 mg total) by mouth 2 (two) times daily with a meal. 10/10/22 10/10/23 Yes Weaver, Scott T, PA-C  clopidogrel (PLAVIX) 75 MG tablet Take 1 tablet (75 mg total) by mouth daily. 10/10/22 10/10/23 Yes Weaver, Scott T, PA-C  colchicine 0.6 MG tablet Take 0.6 mg by mouth daily as needed (gout).    Yes [provider]  CRANBERRY PO Take 1 capsule by mouth daily.   Yes [provider]  furosemide (LASIX) 40 MG tablet Take 1 tablet (40 mg total) by mouth daily. 10/10/22 10/10/23 Yes Weaver, Scott T, PA-C  ipratropium (ATROVENT) 0.06 % nasal spray Place 2 sprays into both nostrils 3 (three) times daily as needed for rhinitis. 12/05/21  Yes [provider]  metFORMIN (GLUCOPHAGE-XR) 750 MG 24 hr tablet Take 750 mg by mouth daily after breakfast. 06/05/18  Yes [provider]  nitrofurantoin, macrocrystal-monohydrate, (MACROBID) 100 MG capsule Take 1 capsule (100 mg total) by mouth every 12 (twelve) hours. 10/08/22  Yes Weaver, Scott T, PA-C  potassium chloride SA (KLOR-CON M) 20 MEQ tablet Take 1 tablet (20 mEq total) by mouth daily. 10/10/22 10/10/23  Yes Weaver, Scott T, PA-C  rosuvastatin (CRESTOR) 20 MG tablet Take 1 tablet (20 mg total) by mouth daily. 10/10/22 10/10/23 Yes Tereso Newcomer T, PA-C    Inpatient Medications: Scheduled Meds:  Continuous Infusions:   PRN Meds:   Allergies:    Allergies  Allergen Reactions   Erythromycin Other (See Comments)    Severe Abdominal Pain   Penicillins Hives    Social History:   Social History   Socioeconomic History   Marital status: Married    Spouse name: Alice   Number of children: 2   Years of education: Not on file   Highest education level: Not on file  Occupational History   Occupation: Retired  Tobacco Use   Smoking status: Former    Packs/day: 1.00    Years: 31.00    Additional pack years: 0.00    Total pack years: 31.00    Types: Cigarettes    Quit date: 1988    Years since quitting: 36.4   Smokeless tobacco: Never  Vaping Use   Vaping Use: Never used  Substance and Sexual Activity   Alcohol use: Yes    Alcohol/week: 0.0 standard drinks of alcohol    Comment: social   Drug use: No   Sexual activity: Not Currently  Other Topics Concern   Not on file  Social History Narrative   Not on file   Social Determinants of Health   Financial Resource Strain: Low Risk  (10/07/2022)   Overall Financial Resource Strain (CARDIA)    Difficulty of Paying Living Expenses: Not very hard  Food Insecurity: No Food Insecurity (10/07/2022)   Hunger Vital Sign    Worried About Running Out of Food in the Last Year: Never true    Ran Out of Food in the Last Year: Never true  Transportation Needs: No Transportation Needs (10/07/2022)   PRAPARE - Administrator, Civil Service (Medical): No    Lack of Transportation (Non-Medical): No  Physical Activity: Not on file  Stress: Not on file  Social Connections: Not on file  Intimate Partner Violence: Not At Risk (10/08/2022)   Humiliation, Afraid, Rape, and Kick questionnaire    Fear of Current or Ex-Partner: No     Emotionally Abused: No    Physically Abused: No    Sexually Abused: No    Family History:    Family History  Problem Relation Age of Onset   Heart disease Sister      ROS:  Please see the history of present illness.  All other ROS reviewed and negative.     Physical Exam/Data:   Vitals:   10/13/22 1230 10/13/22 1245 10/13/22 1300 10/13/22 1315  BP:      Pulse: 70 70 71 82  Resp: (!) 24 (!) 24 14 (!) 24  Temp:      TempSrc:      SpO2: 96% 96% 98% 91%  Weight:      Height:        Intake/Output Summary (Last 24 hours) at 10/13/2022 1347 Last data filed at 10/13/2022 1242 Gross per 24 hour  Intake 50 ml  Output --  Net 50 ml      10/13/2022   10:07 AM 10/05/2022    7:00 AM 10/05/2022   12:33 AM  Last 3 Weights  Weight (lbs) 166 lb 10.7 oz 166 lb 10.7 oz 135 lb  Weight (kg) 75.6 kg 75.6 kg 61.236 kg     Body mass index is 23.91 kg/m.  General:  Ill appearing, well developed, in no acute distress Neck: no JVD Cardiac:  normal S1, S2; RRR; no murmur  Lungs:  Fine crackles bilateral bases, otherwise clear to auscultation, no wheezing, rhonchi or rales  Abd: soft, nontender Ext: +1 bilateral lower extremity edema Skin: warm and dry  Neuro:  CNs 2-12 intact, no focal abnormalities noted Psych:  Normal affect   EKG:  The EKG was personally reviewed and demonstrates:  1st degree AV block, with persistent ST elevation in AVR, St depression in lateral leads. Unchanged from EKG 10/06/22.   Telemetry:  Telemetry was personally reviewed and demonstrates:  1st degree AV block HR 70s-80s  Relevant CV Studies: Chest x-ray 10/13/22 IMPRESSION: Stable bilateral lung opacities and small bilateral pleural effusions   Echo 10/05/22   1. Presence of apical swirling/sludge with definity contrast without  evidence of formed thrombus. Left ventricular ejection fraction, by  estimation, is 20 to 25%. The left ventricle has severely decreased  function. The left ventricle demonstrates  global  hypokinesis. Left ventricular diastolic parameters are consistent with  Grade II diastolic dysfunction (pseudonormalization).   2. Right ventricular systolic function is normal. The right ventricular  size is normal. There is moderately elevated pulmonary artery systolic  pressure. The estimated right ventricular systolic pressure is 59.1 mmHg.   3. The mitral valve is grossly normal. Trivial mitral valve  regurgitation.   4. Tricuspid valve regurgitation is mild to moderate.   5. The aortic valve is calcified. There is moderate calcification of the  aortic valve. There is moderate thickening of the aortic valve. Aortic  valve regurgitation is mild. Mild aortic valve stenosis. Aortic valve  area, by VTI measures 1.11 cm. Aortic  valve mean gradient measures 10.0 mmHg. Aortic valve Vmax measures 2.06  m/s.   6. The inferior vena cava is dilated in size with <50% respiratory  variability, suggesting right atrial pressure of 15 mmHg.   R/LHC 10/05/22  1.  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  Severe proximal and mid LAD stenoses of 90% 3.  Severe obtuse marginal stenosis of 95% 4.  Nondominant RCA, not visualized 5.  Severe segmental LV systolic dysfunction with LVEF estimated at 30% 6.  Mild aortic insufficiency assessed by supravalvular aortogram 7.  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  Successful intra-aortic balloon pump insertion   Recommendations: IV heparin, continue IABP 1:1, cardiac surgical consultation.  Difficult situation in this elderly patient, will need heart team approach to his care.    Dominance: Left    Laboratory Data:  High Sensitivity Troponin:   Recent Labs  Lab 10/05/22 0130 10/05/22 0248 10/13/22 0955  TROPONINIHS 2,206* 2,258* 7,238*     Chemistry Recent Labs  Lab 10/07/22 0038 10/08/22 0257 10/13/22 0955  NA 138 140 137  K 3.8 3.8 4.3  CL 103 104 102  CO2 27 28 24   GLUCOSE 98 96  93  BUN 14 17 19   CREATININE 1.00 1.17 1.35*  CALCIUM 8.3* 8.8* 8.2*  MG 1.8  --  1.8  GFRNONAA >60 >60 52*  ANIONGAP 8 8 11     Recent Labs  Lab 10/13/22 0955  PROT 6.0*  ALBUMIN 3.2*  AST 67*  ALT 24  ALKPHOS 52  BILITOT 0.4   Hematology Recent Labs  Lab 10/08/22 0257 10/09/22 0241 10/13/22 0955  WBC 5.6 4.8 7.7  RBC 3.51* 3.60* 3.76*  HGB 10.7* 11.3* 11.7*  HCT 33.9* 35.6* 38.4*  MCV 96.6 98.9 102.1*  MCH 30.5 31.4 31.1  MCHC 31.6 31.7 30.5  RDW 14.5 14.2 13.9  PLT 202 221 306    BNP Recent Labs  Lab 10/13/22 0955  BNP 1,954.1*    Radiology/Studies:  DG Chest Port 1 View  Result Date: 10/13/2022 CLINICAL DATA:  Shortness of breath. EXAM: PORTABLE CHEST 1 VIEW COMPARISON:  Oct 06, 2022. FINDINGS: Stable cardiomegaly. Tip of intra-aortic balloon catheter is not seen currently. Stable bilateral lung opacities are noted, right greater than left, concerning for pulmonary edema or possibly multifocal pneumonia. Small bilateral pleural effusions are noted. Bony thorax is unremarkable. IMPRESSION: Stable bilateral lung opacities and pleural effusions as described above. Electronically Signed   By: Lupita Raider M.D.   On: 10/13/2022 11:02     Assessment and Plan:   Acute on chronic HFrEF/ ICM Acute hypoxic respiratory failure -- presented to the ED fluid overloaded today with SOB requiring oxygen. BNP 1954.1. Chest x-ray bilateral small pleural effusions -- Resp status improved with 40mg  IV lasix  in ED -- still fluid overload on exam -- continue 40mg  IV lasix BID -- potentially transition to PO 40mg  lasix BID tomorrow -- GDMT and diuresis limited by hypotension, will transition from coreg to toprol 12.5 daily to allow for more blood pressure -- will reconsult palliative care  Very difficult scenario because I think a lot of the heart failure symptoms are being driven by ischemia as well.  More than likely he went home on an adequate dosing of furosemide and went  back to eating his normal diet.  This put him in volume overload.  Will need to formalize his outpatient medical regiment prior to discharge.  Would like to try to do that overnight.  CAD Elevated Troponin-> likely demand ischemia mediated infarction with CHF and existing severe CAD -- Cath 10/05/22 showed 95% L main, 90% prox and mid LAD. Patient and family elected to treat medically vs CABG or high risk PCI --Troponin 7238. -- no anginal symptoms, not a candidate for invasive intervention -> Per discussion during previous hospitalization -- Continue aspirin, plavix, statin   I suspect this is more consistent with demand ischemia mediated infarction due to CHF and severe underlying CAD.  I do not think we need to heparinize  CKD III -- Creatine up from baseline (0.9-1) at 1.35 on admission -- Continue to monitor  Hypotension --consider adding midodrine to allow for more diuresis   Risk Assessment/Risk Scores:         :[id number]}    New York Heart Association (NYHA) Functional Class NYHA Class III    For questions or updates, please contact [facility] Please consult [email or web address] for contact info under    Signed, Osborne Oman, RN Student Nurse Practitioner 10/13/2022 1:47 PM    ATTENDING ATTESTATION  I have seen, examined and evaluated the patient this afternoon along with Deniece Ree, RN-Student NP and her supervising NP Laverda Page, NP.  After reviewing all the available data and chart, we discussed the patients laboratory, study & physical findings as well as symptoms in detail.  I agree with her findings, examination as well as impression recommendations as per our discussion.    Attending adjustments noted in italics.   Very difficult situation with an elderly gentleman found to have severe left main disease and severely reduced EF.  He was just discharged home after being on balloon pump with optimization of medical therapy.  Shortly after going home he now  presents back with elevated troponin and CHF exacerbation.  Feels better already after IV diuresis.  He did not have any anginal symptoms at this time around, mostly noting but would seem more consistent with CHF symptoms.  I suspect this is demand ischemia and not true non-STEMI.  He has severe left main disease and would get ischemic with any potential change in oxygenation or filling pressures.  We will need to address his discharge regimen to ensure that we have an adequate plan to deal with his borderline pressures and need for diuresis.  Will transition from carvedilol to Toprol to allow for more blood pressure room.  Will then try to figure out some type of afterload reduction versus spironolactone in addition to standing dose of furosemide.  He will then need to have a sliding scale plan to keep him out of the hospital.  We would need to get the other team involved again to discuss the concept of palliative care and even potentially considering hospice care since he is already been hospitalized this close  to recent hospitalization and has an revascularizable CAD with severely reduced EF.    Marykay Lex, MD, MS Bryan Lemma, M.D., M.S. Interventional Cardiologist  [facility]  Pager # [phone] Phone # [phone] [address] [place], [place] [address]

## 2022-10-13 NOTE — ED Notes (Signed)
Pt has 3+ edema of LE bilat. Pt has 1+ pedal pulses bilat, warm to touch, cap refill less than 3 sec.

## 2022-10-13 NOTE — Plan of Care (Signed)
  Problem: Cardiac: Goal: Ability to achieve and maintain adequate cardiovascular perfusion will improve Outcome: Progressing   Problem: Education: Goal: Understanding of CV disease, CV risk reduction, and recovery process will improve Outcome: Not Applicable Goal: Individualized Educational Video(s) Outcome: Not Applicable   Problem: Activity: Goal: Risk for activity intolerance will decrease Outcome: Progressing   Problem: Nutrition: Goal: Adequate nutrition will be maintained Outcome: Progressing   Problem: Coping: Goal: Level of anxiety will decrease Outcome: Progressing   Problem: Pain Managment: Goal: General experience of comfort will improve Outcome: Progressing   Problem: Safety: Goal: Ability to remain free from injury will improve Outcome: Progressing   Problem: Skin Integrity: Goal: Risk for impaired skin integrity will decrease Outcome: Progressing

## 2022-10-13 NOTE — ED Provider Notes (Signed)
[place] [facility] AT [facility] Provider Note  CSN: [id number] Arrival date & time: 10/13/22 4098  Chief Complaint(s) Shortness of Breath  HPI [name] Steele is a 82 y.o. male with history of coronary artery disease, CKD, diabetes, ischemic cardiomyopathy, recent admission found to have critical left main disease, declined surgical intervention presenting to the emergency department with shortness of breath.  Reports that since last night he has had increased shortness of breath, worse with lying flat.  He denies any new cough, fevers or chills, nausea or vomiting.  He has had some worsening leg swelling.  Denies any chest pain.  He called paramedics this morning, they found that he was 84% on room air.   Past Medical History Past Medical History:  Diagnosis Date   Arthritis    CAD (coronary artery disease) 10/05/2022   Cath 09/2022: 3v CAD, critical LM stenosis - Pt opted for med Rx only>>DNR   Chronic kidney disease, stage 3a (HCC)    Chronic urinary tract infection    Coronary artery calcification seen on CT scan    Diabetes mellitus without complication (HCC)    Gout    Heart murmur    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   S/p non-STEMI 09/2022>> three-vessel and critical left main stenosis; patient opted for medical management // TTE 09/2022: EF 20-25, GR 2 DD, RVSP 59.1, trivial MR, mild-moderate TR, mild aortic stenosis, mean gradient 10, V-max 206 cm/s, RAP 15   History of non-ST elevation myocardial infarction (NSTEMI) 10/05/2022   Hypertension    ILD (interstitial lung disease) (HCC)    by CT 01/2021   Ischemic cardiomyopathy 10/10/2022   Mild aortic stenosis    Mild dilation of ascending aorta [facility])    Osteoporosis    Patient Active Problem List   Diagnosis Date Noted   Heart failure (HCC) 10/13/2022   Acute 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   Ischemic cardiomyopathy  10/10/2022   Mild aortic stenosis 10/10/2022   NSTEMI (non-ST elevated myocardial infarction) (HCC) 10/05/2022   Near syncope 02/06/2022   E. coli UTI 02/06/2022   Infrarenal abdominal aortic aneurysm (AAA) without rupture (HCC) 02/06/2022   Hypokalemia 02/06/2022   Normocytic anemia 02/06/2022   Coronary artery disease involving native coronary artery of native heart with angina pectoris (HCC) 04/14/2021   Heart murmur 04/14/2021   Nonspecific abnormal electrocardiogram (ECG) (EKG) 04/14/2021   Lower extremity edema 04/14/2021   Acute cholecystitis 07/04/2018   E coli bacteremia 04/11/2017   UTI (urinary tract infection) 04/10/2017   Sepsis (HCC) 04/10/2017   Type 2 diabetes mellitus without complication (HCC) 04/10/2017   Essential hypertension 04/10/2017   Home Medication(s) Prior to Admission medications   Medication Sig Start Date End Date Taking? Authorizing Provider  aspirin EC 81 MG tablet Take 1 tablet (81 mg total) by mouth daily. Swallow whole. 04/14/21  Yes Jake Bathe, MD  carvedilol (COREG) 3.125 MG tablet Take 1 tablet (3.125 mg total) by mouth 2 (two) times daily with a meal. 10/10/22 10/10/23 Yes Weaver, Scott T, PA-C  clopidogrel (PLAVIX) 75 MG tablet Take 1 tablet (75 mg total) by mouth daily. 10/10/22 10/10/23 Yes Weaver, Scott T, PA-C  colchicine 0.6 MG tablet Take 0.6 mg by mouth daily as needed (gout).    Yes [provider]  CRANBERRY PO Take 1 capsule by mouth daily.   Yes [provider]  furosemide (LASIX) 40 MG tablet Take 1 tablet (40  mg total) by mouth daily. 10/10/22 10/10/23 Yes Weaver, Scott T, PA-C  ipratropium (ATROVENT) 0.06 % nasal spray Place 2 sprays into both nostrils 3 (three) times daily as needed for rhinitis. 12/05/21  Yes [provider]  metFORMIN (GLUCOPHAGE-XR) 750 MG 24 hr tablet Take 750 mg by mouth daily after breakfast. 06/05/18  Yes [provider]  nitrofurantoin, macrocrystal-monohydrate, (MACROBID)  100 MG capsule Take 1 capsule (100 mg total) by mouth every 12 (twelve) hours. 10/08/22  Yes Weaver, Scott T, PA-C  potassium chloride SA (KLOR-CON M) 20 MEQ tablet Take 1 tablet (20 mEq total) by mouth daily. 10/10/22 10/10/23 Yes Weaver, Scott T, PA-C  rosuvastatin (CRESTOR) 20 MG tablet Take 1 tablet (20 mg total) by mouth daily. 10/10/22 10/10/23 Yes Beatrice Lecher, PA-C                                                                                                                                    Past Surgical History Past Surgical History:  Procedure Laterality Date   AORTIC ARCH ANGIOGRAPHY N/A 10/05/2022   Procedure: AORTIC ARCH ANGIOGRAPHY;  Surgeon: Tonny Bollman, MD;  Location: [facility] INVASIVE CV LAB;  Service: Cardiovascular;  Laterality: N/A;   CHOLECYSTECTOMY N/A 07/04/2018   Procedure: LAPAROSCOPIC CHOLECYSTECTOMY WITH INTRAOPERATIVE CHOLANGIOGRAM;  Surgeon: Abigail Miyamoto, MD;  Location: [facility] ORS;  Service: General;  Laterality: N/A;   ERCP N/A 07/05/2018   Procedure: ENDOSCOPIC RETROGRADE CHOLANGIOPANCREATOGRAPHY (ERCP);  Surgeon: Vida Rigger, MD;  Location: [facility] ENDOSCOPY;  Service: Endoscopy;  Laterality: N/A;   IABP INSERTION N/A 10/05/2022   Procedure: IABP Insertion;  Surgeon: Tonny Bollman, MD;  Location: [facility] INVASIVE CV LAB;  Service: Cardiovascular;  Laterality: N/A;   INGUINAL HERNIA REPAIR Right 07/31/2022   Procedure: OPEN RIGHT INGUINAL HERNIA REPAIR WITH MESH;  Surgeon: Berna Bue, MD;  Location: [facility] ORS;  Service: General;  Laterality: Right;   JOINT REPLACEMENT Bilateral    REMOVAL OF STONES  07/05/2018   Procedure: REMOVAL OF STONES;  Surgeon: Vida Rigger, MD;  Location: [facility] ENDOSCOPY;  Service: Endoscopy;;   RIGHT/LEFT HEART CATH AND CORONARY ANGIOGRAPHY N/A 10/05/2022   Procedure: RIGHT/LEFT HEART CATH AND CORONARY ANGIOGRAPHY;  Surgeon: Tonny Bollman, MD;  Location: [facility] INVASIVE CV LAB;  Service: Cardiovascular;  Laterality: N/A;   SPHINCTEROTOMY  07/05/2018    Procedure: SPHINCTEROTOMY;  Surgeon: Vida Rigger, MD;  Location: [facility] ENDOSCOPY;  Service: Endoscopy;;   Family History Family History  Problem Relation Age of Onset   Heart disease Sister     Social History Social History   Tobacco Use   Smoking status: Former    Packs/day: 1.00    Years: 31.00    Additional pack years: 0.00    Total pack years: 31.00    Types: Cigarettes    Quit date: 1988    Years since quitting: 36.4   Smokeless tobacco: Never  Vaping Use   Vaping Use: Never  used  Substance Use Topics   Alcohol use: Yes    Alcohol/week: 0.0 standard drinks of alcohol    Comment: social   Drug use: No   Allergies Erythromycin and Penicillins  Review of Systems Review of Systems  All other systems reviewed and are negative.   Physical Exam Vital Signs  I have reviewed the triage vital signs BP 91/66   Pulse 69   Temp (!) 97.4 F (36.3 C) (Oral)   Resp 14   Ht 5\' 10"  (1.778 m)   Wt 75.6 kg   SpO2 98%   BMI 23.91 kg/m  Physical Exam Vitals and nursing note reviewed.  Constitutional:      General: He is not in acute distress.    Appearance: Normal appearance.  HENT:     Mouth/Throat:     Mouth: Mucous membranes are moist.  Eyes:     Conjunctiva/sclera: Conjunctivae normal.  Neck:     Vascular: Hepatojugular reflux and JVD present.  Cardiovascular:     Rate and Rhythm: Normal rate and regular rhythm.  Pulmonary:     Effort: Pulmonary effort is normal. No respiratory distress.     Breath sounds: Examination of the right-lower field reveals rales. Examination of the left-lower field reveals rales. Rales present.  Abdominal:     General: Abdomen is flat.     Palpations: Abdomen is soft.     Tenderness: There is no abdominal tenderness.  Musculoskeletal:     Right lower leg: Edema present.     Left lower leg: Edema present.  Skin:    General: Skin is warm and dry.     Capillary Refill: Capillary refill takes less than 2 seconds.  Neurological:      Mental Status: He is alert and oriented to person, place, and time. Mental status is at baseline.  Psychiatric:        Mood and Affect: Mood normal.        Behavior: Behavior normal.     ED Results and Treatments Labs (all labs ordered are listed, but only abnormal results are displayed) Labs Reviewed  COMPREHENSIVE METABOLIC PANEL - Abnormal; Notable for the following components:      Result Value   Creatinine, Ser 1.35 (*)    Calcium 8.2 (*)    Total Protein 6.0 (*)    Albumin 3.2 (*)    AST 67 (*)    GFR, Estimated 52 (*)    All other components within normal limits  BRAIN NATRIURETIC PEPTIDE - Abnormal; Notable for the following components:   B Natriuretic Peptide 1,954.1 (*)    All other components within normal limits  CBC WITH DIFFERENTIAL/PLATELET - Abnormal; Notable for the following components:   RBC 3.76 (*)    Hemoglobin 11.7 (*)    HCT 38.4 (*)    MCV 102.1 (*)    All other components within normal limits  TROPONIN I (HIGH SENSITIVITY) - Abnormal; Notable for the following components:   Troponin I (High Sensitivity) 7,238 (*)    All other components within normal limits  TROPONIN I (HIGH SENSITIVITY) - Abnormal; Notable for the following components:   Troponin I (High Sensitivity) 5,770 (*)    All other components within normal limits  MAGNESIUM  Radiology DG Chest Port 1 View  Result Date: 10/13/2022 CLINICAL DATA:  Shortness of breath. EXAM: PORTABLE CHEST 1 VIEW COMPARISON:  Oct 06, 2022. FINDINGS: Stable cardiomegaly. Tip of intra-aortic balloon catheter is not seen currently. Stable bilateral lung opacities are noted, right greater than left, concerning for pulmonary edema or possibly multifocal pneumonia. Small bilateral pleural effusions are noted. Bony thorax is unremarkable. IMPRESSION: Stable bilateral lung opacities and pleural  effusions as described above. Electronically Signed   By: Lupita Raider M.D.   On: 10/13/2022 11:02    Pertinent labs & imaging results that were available during my care of the patient were reviewed by me and considered in my medical decision making (see MDM for details).  Medications Ordered in ED Medications  aspirin EC tablet 81 mg (81 mg Oral Given 10/13/22 1440)  clopidogrel (PLAVIX) tablet 75 mg (75 mg Oral Given 10/13/22 1440)  furosemide (LASIX) injection 40 mg (40 mg Intravenous Given 10/13/22 1136)  magnesium sulfate IVPB 2 g 50 mL (0 g Intravenous Stopped 10/13/22 1242)                                                                                                                                     Procedures .Critical Care  Performed by: Lonell Grandchild, MD Authorized by: Lonell Grandchild, MD   Critical care provider statement:    Critical care time (minutes):  30   Critical care was necessary to treat or prevent imminent or life-threatening deterioration of the following conditions:  Cardiac failure   Critical care was time spent personally by me on the following activities:  Development of treatment plan with patient or surrogate, discussions with consultants, evaluation of patient's response to treatment, examination of patient, ordering and review of laboratory studies, ordering and review of radiographic studies, ordering and performing treatments and interventions, pulse oximetry, re-evaluation of patient's condition and review of old charts   Care discussed with: admitting provider     (including critical care time)  Medical Decision Making / ED Course   MDM:  82 year old male presenting to the emergency department with shortness of breath.  Physical exam with 3+ bilateral lower extremity edema, bibasilar crackles, borderline hypoxia.  Suspect likely CHF exacerbation probably in the setting of recent NSTEMI.  He was recently hospitalized and found to have  critical left main disease but declined operative intervention with with cardiothoracic surgery.  EKG continues to show diffuse ST depression similar to previous.  Likely sign of significant left main disease.  He denies any active chest pain currently.  Will give Lasix.  Less likely other causes of shortness of breath such as pneumonia, no cough, fevers or chills.  Doubt pneumothorax, breath sounds equal.  Less likely ACS given no chest pain and unchanged EKG.  Doubt COPD exacerbation, no wheezing.  Patient will likely need admission for further diuresis given his signs of volume overload and  hypoxia.  Clinical Course as of 10/13/22 1442  Tue Oct 13, 2022  1440 Patient admitted to cardiology service for CHF. BNP very elevated. Troponin downtrending likely elevated due to demand and recent NSTEMI [WS]    Clinical Course User Index [WS] Lonell Grandchild, MD     Additional history obtained: -Additional history obtained from family and ems -External records from outside source obtained and reviewed including: Chart review including previous notes, labs, imaging, consultation notes including recent hospitalization and cath reports   Lab Tests: -I ordered, reviewed, and interpreted labs.   The pertinent results include:   Labs Reviewed  COMPREHENSIVE METABOLIC PANEL - Abnormal; Notable for the following components:      Result Value   Creatinine, Ser 1.35 (*)    Calcium 8.2 (*)    Total Protein 6.0 (*)    Albumin 3.2 (*)    AST 67 (*)    GFR, Estimated 52 (*)    All other components within normal limits  BRAIN NATRIURETIC PEPTIDE - Abnormal; Notable for the following components:   B Natriuretic Peptide 1,954.1 (*)    All other components within normal limits  CBC WITH DIFFERENTIAL/PLATELET - Abnormal; Notable for the following components:   RBC 3.76 (*)    Hemoglobin 11.7 (*)    HCT 38.4 (*)    MCV 102.1 (*)    All other components within normal limits  TROPONIN I (HIGH  SENSITIVITY) - Abnormal; Notable for the following components:   Troponin I (High Sensitivity) 7,238 (*)    All other components within normal limits  TROPONIN I (HIGH SENSITIVITY) - Abnormal; Notable for the following components:   Troponin I (High Sensitivity) 5,770 (*)    All other components within normal limits  MAGNESIUM    Notable for significant troponin elevation likely due to recent MI, elevated BNP  EKG   EKG Interpretation  Date/Time:  Tuesday Oct 13 2022 09:57:19 EDT Ventricular Rate:  91 PR Interval:  261 QRS Duration: 91 QT Interval:  344 QTC Calculation: 424 R Axis:   -59 Text Interpretation: Sinus rhythm Prolonged PR interval Left anterior fascicular block Diffuse ST depression Similar to previous Confirmed by Alvino Blood ([id number]) on 10/13/2022 10:02:14 AM         Imaging Studies ordered: I ordered imaging studies including CXR On my interpretation imaging demonstrates pulmonary edema I independently visualized and interpreted imaging. I agree with the radiologist interpretation   Medicines ordered and prescription drug management: Meds ordered this encounter  Medications   furosemide (LASIX) injection 40 mg   magnesium sulfate IVPB 2 g 50 mL   aspirin EC tablet 81 mg   clopidogrel (PLAVIX) tablet 75 mg    -I have reviewed the patients home medicines and have made adjustments as needed   Consultations Obtained: I requested consultation with the cardiologist,  and discussed lab and imaging findings as well as pertinent plan - they recommend: admission   Cardiac Monitoring: The patient was maintained on a cardiac monitor.  I personally viewed and interpreted the cardiac monitored which showed an underlying rhythm of: NSR  Social Determinants of Health:  Diagnosis or treatment significantly limited by social determinants of health: former smoker   Reevaluation: After the interventions noted above, I reevaluated the patient and found that  their symptoms have improved  Co morbidities that complicate the patient evaluation  Past Medical History:  Diagnosis Date   Arthritis    CAD (coronary artery disease) 10/05/2022   Cath 09/2022: [facility]a Cooper  CAD, critical LM stenosis - Pt opted for med Rx only>>DNR   Chronic kidney disease, stage 3a (HCC)    Chronic urinary tract infection    Coronary artery calcification seen on CT scan    Diabetes mellitus without complication (HCC)    Gout    Heart murmur    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   S/p non-STEMI 09/2022>> three-vessel and critical left main stenosis; patient opted for medical management // TTE 09/2022: EF 20-25, GR 2 DD, RVSP 59.1, trivial MR, mild-moderate TR, mild aortic stenosis, mean gradient 10, V-max 206 cm/s, RAP 15   History of non-ST elevation myocardial infarction (NSTEMI) 10/05/2022   Hypertension    ILD (interstitial lung disease) (HCC)    by CT 01/2021   Ischemic cardiomyopathy 10/10/2022   Mild aortic stenosis    Mild dilation of ascending aorta (HCC)    Osteoporosis       Dispostion: Disposition decision including need for hospitalization was considered, and patient admitted to the hospital.    Final Clinical Impression(s) / ED Diagnoses Final diagnoses:  Acute systolic congestive heart failure (HCC)     This chart was dictated using voice recognition software.  Despite best efforts to proofread,  errors can occur which can change the documentation meaning.    Lonell Grandchild, MD 10/13/22 [phone]

## 2022-10-14 DIAGNOSIS — N179 Acute kidney failure, unspecified: Secondary | ICD-10-CM

## 2022-10-14 DIAGNOSIS — Z79899 Other long term (current) drug therapy: Secondary | ICD-10-CM | POA: Diagnosis not present

## 2022-10-14 DIAGNOSIS — I959 Hypotension, unspecified: Secondary | ICD-10-CM

## 2022-10-14 DIAGNOSIS — Z8249 Family history of ischemic heart disease and other diseases of the circulatory system: Secondary | ICD-10-CM | POA: Diagnosis not present

## 2022-10-14 DIAGNOSIS — I255 Ischemic cardiomyopathy: Secondary | ICD-10-CM | POA: Diagnosis present

## 2022-10-14 DIAGNOSIS — I130 Hypertensive heart and chronic kidney disease with heart failure and stage 1 through stage 4 chronic kidney disease, or unspecified chronic kidney disease: Secondary | ICD-10-CM | POA: Diagnosis present

## 2022-10-14 DIAGNOSIS — N1832 Chronic kidney disease, stage 3b: Secondary | ICD-10-CM | POA: Diagnosis not present

## 2022-10-14 DIAGNOSIS — Z7902 Long term (current) use of antithrombotics/antiplatelets: Secondary | ICD-10-CM | POA: Diagnosis not present

## 2022-10-14 DIAGNOSIS — Z7982 Long term (current) use of aspirin: Secondary | ICD-10-CM | POA: Diagnosis not present

## 2022-10-14 DIAGNOSIS — J9601 Acute respiratory failure with hypoxia: Secondary | ICD-10-CM | POA: Diagnosis not present

## 2022-10-14 DIAGNOSIS — I060 Rheumatic aortic stenosis: Secondary | ICD-10-CM | POA: Diagnosis present

## 2022-10-14 DIAGNOSIS — I21A1 Myocardial infarction type 2: Secondary | ICD-10-CM | POA: Diagnosis not present

## 2022-10-14 DIAGNOSIS — M1990 Unspecified osteoarthritis, unspecified site: Secondary | ICD-10-CM | POA: Diagnosis present

## 2022-10-14 DIAGNOSIS — I25118 Atherosclerotic heart disease of native coronary artery with other forms of angina pectoris: Secondary | ICD-10-CM | POA: Diagnosis not present

## 2022-10-14 DIAGNOSIS — M109 Gout, unspecified: Secondary | ICD-10-CM | POA: Diagnosis present

## 2022-10-14 DIAGNOSIS — I5043 Acute on chronic combined systolic (congestive) and diastolic (congestive) heart failure: Secondary | ICD-10-CM | POA: Diagnosis present

## 2022-10-14 DIAGNOSIS — J9621 Acute and chronic respiratory failure with hypoxia: Secondary | ICD-10-CM | POA: Diagnosis present

## 2022-10-14 DIAGNOSIS — E1122 Type 2 diabetes mellitus with diabetic chronic kidney disease: Secondary | ICD-10-CM | POA: Diagnosis present

## 2022-10-14 DIAGNOSIS — I5023 Acute on chronic systolic (congestive) heart failure: Secondary | ICD-10-CM | POA: Diagnosis present

## 2022-10-14 DIAGNOSIS — E785 Hyperlipidemia, unspecified: Secondary | ICD-10-CM | POA: Diagnosis present

## 2022-10-14 DIAGNOSIS — R0602 Shortness of breath: Secondary | ICD-10-CM | POA: Diagnosis present

## 2022-10-14 DIAGNOSIS — I25119 Atherosclerotic heart disease of native coronary artery with unspecified angina pectoris: Secondary | ICD-10-CM | POA: Diagnosis not present

## 2022-10-14 DIAGNOSIS — J849 Interstitial pulmonary disease, unspecified: Secondary | ICD-10-CM | POA: Diagnosis present

## 2022-10-14 DIAGNOSIS — Z66 Do not resuscitate: Secondary | ICD-10-CM | POA: Diagnosis present

## 2022-10-14 DIAGNOSIS — I350 Nonrheumatic aortic (valve) stenosis: Secondary | ICD-10-CM | POA: Diagnosis not present

## 2022-10-14 DIAGNOSIS — Z87891 Personal history of nicotine dependence: Secondary | ICD-10-CM | POA: Diagnosis not present

## 2022-10-14 DIAGNOSIS — Z881 Allergy status to other antibiotic agents status: Secondary | ICD-10-CM | POA: Diagnosis not present

## 2022-10-14 DIAGNOSIS — N1831 Chronic kidney disease, stage 3a: Secondary | ICD-10-CM | POA: Diagnosis present

## 2022-10-14 DIAGNOSIS — M810 Age-related osteoporosis without current pathological fracture: Secondary | ICD-10-CM | POA: Diagnosis present

## 2022-10-14 DIAGNOSIS — I2510 Atherosclerotic heart disease of native coronary artery without angina pectoris: Secondary | ICD-10-CM | POA: Diagnosis present

## 2022-10-14 DIAGNOSIS — Z515 Encounter for palliative care: Secondary | ICD-10-CM | POA: Diagnosis not present

## 2022-10-14 DIAGNOSIS — I10 Essential (primary) hypertension: Secondary | ICD-10-CM | POA: Diagnosis not present

## 2022-10-14 DIAGNOSIS — Z880 Allergy status to penicillin: Secondary | ICD-10-CM | POA: Diagnosis not present

## 2022-10-14 DIAGNOSIS — Z7189 Other specified counseling: Secondary | ICD-10-CM | POA: Diagnosis not present

## 2022-10-14 DIAGNOSIS — E118 Type 2 diabetes mellitus with unspecified complications: Secondary | ICD-10-CM | POA: Diagnosis not present

## 2022-10-14 DIAGNOSIS — Z7984 Long term (current) use of oral hypoglycemic drugs: Secondary | ICD-10-CM | POA: Diagnosis not present

## 2022-10-14 DIAGNOSIS — I214 Non-ST elevation (NSTEMI) myocardial infarction: Secondary | ICD-10-CM | POA: Diagnosis present

## 2022-10-14 LAB — GLUCOSE, CAPILLARY
Glucose-Capillary: 90 mg/dL (ref 70–99)
Glucose-Capillary: 104 mg/dL — ABNORMAL HIGH (ref 70–99)
Glucose-Capillary: 117 mg/dL — ABNORMAL HIGH (ref 70–99)
Glucose-Capillary: 78 mg/dL (ref 70–99)

## 2022-10-14 LAB — BASIC METABOLIC PANEL
Anion gap: 9 (ref 5–15)
BUN: 17 mg/dL (ref 8–23)
CO2: 25 mmol/L (ref 22–32)
Calcium: 8.5 mg/dL — ABNORMAL LOW (ref 8.9–10.3)
Chloride: 103 mmol/L (ref 98–111)
Creatinine, Ser: 1.16 mg/dL (ref 0.61–1.24)
GFR, Estimated: >60 mL/min (ref >60)
Glucose, Bld: 98 mg/dL (ref 70–99)
Potassium: 4 mmol/L (ref 3.5–5.1)
Sodium: 137 mmol/L (ref 135–145)

## 2022-10-14 MED ORDER — METOPROLOL SUCCINATE ER 25 MG PO TB24
12.5000 mg | ORAL_TABLET | Freq: Every day | ORAL | Status: DC
  Administered 2022-10-15 – 2022-10-16 (×2): 12.5 mg via ORAL
  Filled 2022-10-14 (×3): qty 1

## 2022-10-14 MED ORDER — RANOLAZINE ER 500 MG PO TB12
500.0000 mg | ORAL_TABLET | Freq: Two times a day (BID) | ORAL | Status: DC
  Administered 2022-10-14 – 2022-10-16 (×5): 500 mg via ORAL
  Filled 2022-10-14 (×5): qty 1

## 2022-10-14 MED ORDER — MIDODRINE HCL 5 MG PO TABS
2.5000 mg | ORAL_TABLET | Freq: Three times a day (TID) | ORAL | Status: DC
  Administered 2022-10-14 – 2022-10-16 (×7): 2.5 mg via ORAL
  Filled 2022-10-14 (×7): qty 1

## 2022-10-14 NOTE — Consult Note (Signed)
Consultation Note Date: 10/14/2022   Patient Name: [name] Steele  DOB: 1940-11-07  MRN: [id number]  Age / Sex: 82 y.o., male  PCP: [name], MD Referring Physician: Marykay Lex, MD  Reason for Consultation: Establishing goals of care  HPI/Patient Profile: 82 y.o. 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   Clinical Assessment and Goals of Care: Consult reviewed and chart review completed. I met today again with [name] and his wife, [name], at bedside. I met once with them both just last week the day before his discharge. We reviewed his progress since discharge as he is reported to initially doing well but became acutely short of breath with increased BLE edema. EMS was notified and he returned to the hospital.   We spent some time discussing his condition with heart failure and CAD. We reviewed the importance of maintenance medications to prevent fluid buildup and symptoms BUT his blood pressure is low which is limiting his ability to safely receive the medications that we would like to give him. We reviewed the consequences and side effects of worsening hypotension as well as worsening CHF/CAD symptoms. We discussed the balancing act of trying to maintain him in a better position but this may be difficult to accomplish. Dr. Herbie Baltimore came to bedside and explained further.  We reviewed efforts and med changes that will hopefully improve management at home. Reviewed briefly that if we are unable to keep him relieved of symptoms and managed with heart medications at home we ultimately may need to consider hospice support to help manage him and symptoms at home. Wife is a retired Engineer, civil (consulting) and has  volunteered with hospice for many years previously - she understands. For now we will continue efforts and adjustments and they hope that he can manage better at home at time of discharge this time. They agree with outpatient palliative to follow at home this time. They also have plans to follow up in [facility] Failure Clinic 10/23/22.   All questions/concerns addressed. Emotional support provided.   Primary Decision Maker PATIENT    SUMMARY OF RECOMMENDATIONS   - DNR previously established - Hopeful for improved success when discharged this time - Agree with outpatient palliative care referral (wife has been connected with [facility] volunteer services)  Code Status/Advance Care Planning: DNR   Symptom Management:  Per cardiology  Prognosis:  Overall prognosis poor due to advanced heart failure and CAD.   Discharge Planning: Home with Palliative Services      Primary Diagnoses: Present on Admission: **None**   I have reviewed the medical record, interviewed the patient and family, and examined the patient. The following aspects are pertinent.  Past Medical History:  Diagnosis Date   Arthritis    CAD (coronary artery disease) 10/05/2022   Cath 09/2022: 3v CAD, critical LM stenosis -  Pt opted for med Rx only>>DNR   Chronic kidney disease, stage 3a (HCC)    Chronic urinary tract infection    Coronary artery calcification seen on CT scan    Diabetes mellitus without complication (HCC)    Gout    Heart murmur    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   S/p non-STEMI 09/2022>> three-vessel and critical left main stenosis; patient opted for medical management // TTE 09/2022: EF 20-25, GR 2 DD, RVSP 59.1, trivial MR, mild-moderate TR, mild aortic stenosis, mean gradient 10, V-max 206 cm/s, RAP 15   History of non-ST elevation myocardial infarction (NSTEMI) 10/05/2022   Hypertension    ILD (interstitial lung disease) (HCC)    by CT 01/2021   Ischemic  cardiomyopathy 10/10/2022   Mild aortic stenosis    Mild dilation of ascending aorta (HCC)    Osteoporosis    Social History   Socioeconomic History   Marital status: Married    Spouse name: Alice   Number of children: 2   Years of education: Not on file   Highest education level: Not on file  Occupational History   Occupation: Retired  Tobacco Use   Smoking status: Former    Packs/day: 1.00    Years: 31.00    Additional pack years: 0.00    Total pack years: 31.00    Types: Cigarettes    Quit date: 1988    Years since quitting: 36.4   Smokeless tobacco: Never  Vaping Use   Vaping Use: Never used  Substance and Sexual Activity   Alcohol use: Yes    Alcohol/week: 0.0 standard drinks of alcohol    Comment: social   Drug use: No   Sexual activity: Not Currently  Other Topics Concern   Not on file  Social History Narrative   Not on file   Social Determinants of Health   Financial Resource Strain: Low Risk  (10/07/2022)   Overall Financial Resource Strain (CARDIA)    Difficulty of Paying Living Expenses: Not very hard  Food Insecurity: No Food Insecurity (10/13/2022)   Hunger Vital Sign    Worried About Running Out of Food in the Last Year: Never true    Ran Out of Food in the Last Year: Never true  Transportation Needs: No Transportation Needs (10/13/2022)   PRAPARE - Administrator, Civil Service (Medical): No    Lack of Transportation (Non-Medical): No  Physical Activity: Not on file  Stress: Not on file  Social Connections: Not on file   Family History  Problem Relation Age of Onset   Heart disease Sister    Scheduled Meds:  aspirin EC  81 mg Oral Daily   clopidogrel  75 mg Oral Daily   furosemide  40 mg Intravenous Daily   heparin  5,000 Units Subcutaneous Q8H   insulin aspart  0-9 Units Subcutaneous TID WC   metoprolol succinate  12.5 mg Oral Daily   rosuvastatin  20 mg Oral Daily   Continuous Infusions: PRN Meds:.acetaminophen,  nitroGLYCERIN, ondansetron (ZOFRAN) IV Allergies  Allergen Reactions   Erythromycin Other (See Comments)    Severe Abdominal Pain   Penicillins Hives   Review of Systems  Constitutional:  Positive for activity change, appetite change and fatigue.  Respiratory:  Negative for shortness of breath.   Cardiovascular:  Negative for chest pain and palpitations.  Neurological:  Positive for weakness.    Physical Exam Vitals and nursing note reviewed.  Constitutional:  General: He is not in acute distress.    Appearance: He is ill-appearing.  Cardiovascular:     Rate and Rhythm: Normal rate.     Comments: BP soft Pulmonary:     Effort: No tachypnea, accessory muscle usage or respiratory distress.  Abdominal:     Palpations: Abdomen is soft.  Neurological:     Mental Status: He is alert and oriented to person, place, and time.     Comments: Hard of hearing makes communication challenging at times     Vital Signs: BP 91/67 (BP Location: Right Arm)   Pulse 84   Temp 98.4 F (36.9 C) (Oral)   Resp 18   Ht 5\' 9"  (1.753 m)   Wt 70.5 kg   SpO2 98%   BMI 22.95 kg/m  Pain Scale: 0-10   Pain Score: 0-No pain   SpO2: SpO2: 98 % O2 Device:SpO2: 98 % O2 Flow Rate: .   IO: Intake/output summary:  Intake/Output Summary (Last 24 hours) at 10/14/2022 1610 Last data filed at 10/14/2022 9604 Gross per 24 hour  Intake 50 ml  Output 1600 ml  Net -1550 ml    LBM: Last BM Date : 10/12/22 Baseline Weight: Weight: 75.6 kg Most recent weight: Weight: 70.5 kg     Palliative Assessment/Data:     Time In: 1210  Time Total: 75 min  Greater than 50%  of this time was spent counseling and coordinating care related to the above assessment and plan.  Signed by: Yong Channel, NP Palliative Medicine Team Pager # [phone] (M-F 8a-5p) Team Phone # [phone] (Nights/Weekends)

## 2022-10-14 NOTE — Progress Notes (Addendum)
   Heart Failure Stewardship Pharmacist Progress Note   PCP: [name], MD PCP-Cardiologist: Donato Schultz, MD    HPI:  82 yo M with PMH of CHF, CKD, T2DM, mild AS, ILD, and HTN.   Presented to the ED on 5/20 with shortness of breath and chest pain. EKG showed diffuse ST depressions with AVR elevation. Troponin >2K, BNP elevated, hypotensive, LE edema. States he is compliant with his medications. Had a positive stress test in 06/2022 and CAC was 6000. Taken to the cath lab and found 95% stenosis of left main, 90% stenosis of prox and mid LAD and 95% stenosis of obtuse marginal. LVEF ~30% by LV gram. IABP was placed for hemodynamic support. CT surgery consulted for possible CABG. Formal ECHO 5/20 showed LVEF 20-25%, global hypokinesis, G2DD, RV normal, elevated PA pressure, trivial MR, mild AR, mild AS. Ultimately, joint decision was made to not pursue surgical revascularization. IABP wean and removed on 5/21. Discharged on 5/25 on carvedilol, lasix, aspirin, plavix, and crestor.  Unfortunately, back in the ED on 5/28 with shortness of breath, hypoxia, LE edema, and orthopnea. CXR with stable bilateral lung opacities and pleural effusions. BNP elevated.   Current HF Medications: Diuretic: furosemide 40 mg IV daily Beta Blocker: metoprolol XL 12.5 mg daily  Prior to admission HF Medications: Diuretic: furosemide 40 mg daily Beta blocker: carvedilol 3.125 mg BID  Pertinent Lab Values: Serum creatinine 1.16, BUN 17, Potassium 4.0, Sodium 137, BNP 1954.1, Magnesium 1.8  Vital Signs: Weight: 155 lbs (admission weight: 154 lbs). Weight was only documented on date of admission last hospitalization Blood pressure: 90/60s  Heart rate: 70-80s  I/O: -1.3L yesterday; net -1.6L  Medication Assistance / Insurance Benefits Check: Does the patient have prescription insurance?  Yes Type of insurance plan: Medicare  Outpatient Pharmacy:  Prior to admission outpatient [facility] Is the patient  willing to use [facility] TOC pharmacy at discharge? Yes Is the patient willing to transition their outpatient pharmacy to utilize a [facility] outpatient pharmacy?   No    Assessment: 1. 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 NYHA class III symptoms. - Continue furosemide 40 mg IV daily. Strict I/Os and daily weights. Encourage daily weights this admission since last hospitalization only had date of admission weight recorded. Consider placing compression stockings to assist with diuresis. Will likely need more lasix at discharge.  - Agree with transitioning carvedilol to metoprolol XL 12.5 mg daily to allow for more BP room - GDMT limited by hypotension - Holding SGLT2i - just completed outpatient antibiotics for UTI   Plan: 1) Medication changes recommended at this time: - Continue IV diuresis  - Add compression stockings  2) Patient assistance: Sherryll Burger copay $168 Marcelline Deist copay $17.97 - Jardiance copay $150.05  3)  Education  - Initial education completed - Full education to be completed prior to discharge  Sharen Hones, PharmD, BCPS Heart Failure Stewardship Pharmacist Phone [phone]

## 2022-10-14 NOTE — Progress Notes (Signed)
Heart Failure Nurse Navigator Progress Note  PCP: [name], MD PCP-Cardiologist: Anne Fu Admission Diagnosis: Acute systolic congestive heart failure.  Admitted from: Home via EMS  Presentation:   [name] Steele presented with increased shortness of breath, sats 83% on RA, NRB placed on and sats up to 90%. Also with 3-4 + BLE Edema. Was recently hospitalized with critical left main disease, declined surgical intervention. Now readmitted, patient reported to feeling as if he over did it since last discharge, he had family in for the holiday weekend visiting, and he know he didn't really watch his sodium intake,however he was compliant with all his medications. CXR with stable bilateral lung opacities and pleural effusions. BNP 1,954, Troponin 7,238. IV lasix given   Patient was re educate don the sign and symptoms of heart failure, daily weights, diet/ fluid restrictions, taking all medications as prescribed and attending all medical appointments. Patient verbalized his understanding, patient is scheduled for a HF TOC appointment on 10/23/2022.   ECHO/ LVEF: 20-25%  Clinical Course:  Past Medical History:  Diagnosis Date   Arthritis    CAD (coronary artery disease) 10/05/2022   Cath 09/2022: 3v CAD, critical LM stenosis - Pt opted for med Rx only>>DNR   Chronic kidney disease, stage 3a (HCC)    Chronic urinary tract infection    Coronary artery calcification seen on CT scan    Diabetes mellitus without complication (HCC)    Gout    Heart murmur    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   S/p non-STEMI 09/2022>> three-vessel and critical left main stenosis; patient opted for medical management // TTE 09/2022: EF 20-25, GR 2 DD, RVSP 59.1, trivial MR, mild-moderate TR, mild aortic stenosis, mean gradient 10, V-max 206 cm/s, RAP 15   History of non-ST elevation myocardial infarction (NSTEMI) 10/05/2022   Hypertension    ILD (interstitial lung disease) (HCC)     by CT 01/2021   Ischemic cardiomyopathy 10/10/2022   Mild aortic stenosis    Mild dilation of ascending aorta (HCC)    Osteoporosis      Social History   Socioeconomic History   Marital status: Married    Spouse name: Alice   Number of children: 2   Years of education: Not on file   Highest education level: Not on file  Occupational History   Occupation: Retired  Tobacco Use   Smoking status: Former    Packs/day: 1.00    Years: 31.00    Additional pack years: 0.00    Total pack years: 31.00    Types: Cigarettes    Quit date: 1988    Years since quitting: 36.4   Smokeless tobacco: Never  Vaping Use   Vaping Use: Never used  Substance and Sexual Activity   Alcohol use: Yes    Alcohol/week: 0.0 standard drinks of alcohol    Comment: social   Drug use: No   Sexual activity: Not Currently  Other Topics Concern   Not on file  Social History Narrative   Not on file   Social Determinants of Health   Financial Resource Strain: Low Risk  (10/07/2022)   Overall Financial Resource Strain (CARDIA)    Difficulty of Paying Living Expenses: Not very hard  Food Insecurity: No Food Insecurity (10/13/2022)   Hunger Vital Sign    Worried About Running Out of Food in the Last Year: Never true    Ran Out of Food in the Last Year: Never true  Transportation Needs: No  Transportation Needs (10/13/2022)   PRAPARE - Administrator, Civil Service (Medical): No    Lack of Transportation (Non-Medical): No  Physical Activity: Not on file  Stress: Not on file  Social Connections: Not on file    High Risk Criteria for Readmission and/or Poor Patient Outcomes: Heart failure hospital admissions (last 6 months):  1 No Show rate: 2 % Difficult social situation: No Demonstrates medication adherence: Yes Primary Language: English Literacy level: Reading, writing, and comprehension  Barriers of Care:   Diet/ fluid restrictions ( salt intake)   Considerations/Referrals:    Referral made to Heart Failure Pharmacist Stewardship: Yes Referral made to Heart Failure CSW/NCM TOC: No Referral made to Heart & Vascular TOC clinic: Yes, readmitted, original HF TOC appointment for 10/23/2022.   Items for Follow-up on DC/TOC: Readmitted after 2 days home from hospital Diet/ fluid restrictions ( salt)   Rhae Hammock, BSN, RN Heart Failure Teacher, adult education Only

## 2022-10-14 NOTE — Progress Notes (Addendum)
Rounding Note    Patient Name: [name] Steele Date of Encounter: 10/14/2022  [facility] Cardiologist: Donato Schultz, MD   Subjective   Sitting up in bed, resting comfortably. No family at bedside.  Notably less dyspneic.  No chest pain.  Still in bed though. Swelling seems better.  Unfortunately, his Lasix was held this morning as well as his metoprolol last night so we cannot see the response to his medications.  Inpatient Medications    Scheduled Meds:  aspirin EC  81 mg Oral Daily   clopidogrel  75 mg Oral Daily   furosemide  40 mg Intravenous Daily   heparin  5,000 Units Subcutaneous Q8H   insulin aspart  0-9 Units Subcutaneous TID WC   metoprolol succinate  12.5 mg Oral Daily   rosuvastatin  20 mg Oral Daily   Continuous Infusions:  PRN Meds: acetaminophen, nitroGLYCERIN, ondansetron (ZOFRAN) IV   Vital Signs    Vitals:   10/13/22 2035 10/14/22 0004 10/14/22 0443 10/14/22 0726  BP: 98/70 92/69 101/68 91/67  Pulse: 77 76 74 84  Resp: 19 17 16 18   Temp: 99.1 F (37.3 C) 98.7 F (37.1 C) 98 F (36.7 C) 98.4 F (36.9 C)  TempSrc: Oral Oral Axillary Oral  SpO2: 99% 99% 100% 98%  Weight:   70.5 kg   Height:        Intake/Output Summary (Last 24 hours) at 10/14/2022 0824 Last data filed at 10/14/2022 0616 Gross per 24 hour  Intake 50 ml  Output 1600 ml  Net -1550 ml      10/14/2022    4:43 AM 10/13/2022    3:43 PM 10/13/2022   10:07 AM  Last 3 Weights  Weight (lbs) 155 lb 6.8 oz 154 lb 8.7 oz 166 lb 10.7 oz  Weight (kg) 70.5 kg 70.1 kg 75.6 kg      Telemetry    Sinus Rhythm - Personally Reviewed  ECG    No new tracing this morning  Physical Exam   GEN: No acute distress.   Neck: No JVD Cardiac: RRR, no murmurs, rubs, or gallops.  Respiratory: Clear to auscultation bilaterally.  Nonlabored, good air movement. GI: Soft, nontender, non-distended  MS: Trivial edema; No deformity. Neuro:  Nonfocal  Psych: Normal affect   Labs     High Sensitivity Troponin:   Recent Labs  Lab 10/05/22 0130 10/05/22 0248 10/13/22 0955 10/13/22 1220  TROPONINIHS 2,206* 2,258* 7,238* 5,770*     Chemistry Recent Labs  Lab 10/08/22 0257 10/13/22 0955 10/14/22 0639  NA 140 137 137  K 3.8 4.3 4.0  CL 104 102 103  CO2 28 24 25   GLUCOSE 96 93 98  BUN 17 19 17   CREATININE 1.17 1.35* 1.16  CALCIUM 8.8* 8.2* 8.5*  MG  --  1.8  --   PROT  --  6.0*  --   ALBUMIN  --  3.2*  --   AST  --  67*  --   ALT  --  24  --   ALKPHOS  --  52  --   BILITOT  --  0.4  --   GFRNONAA >60 52* >60  ANIONGAP 8 11 9     Lipids No results for input(s): "CHOL", "TRIG", "HDL", "LABVLDL", "LDLCALC", "CHOLHDL" in the last 168 hours.  Hematology Recent Labs  Lab 10/08/22 0257 10/09/22 0241 10/13/22 0955  WBC 5.6 4.8 7.7  RBC 3.51* 3.60* 3.76*  HGB 10.7* 11.3* 11.7*  HCT 33.9* 35.6* 38.4*  MCV 96.6 98.9 102.1*  MCH 30.5 31.4 31.1  MCHC 31.6 31.7 30.5  RDW 14.5 14.2 13.9  PLT 202 221 306   Thyroid No results for input(s): "TSH", "FREET4" in the last 168 hours.  BNP Recent Labs  Lab 10/13/22 0955  BNP 1,954.1*    DDimer No results for input(s): "DDIMER" in the last 168 hours.   Radiology    DG Chest Port 1 View  Result Date: 10/13/2022 CLINICAL DATA:  Shortness of breath. EXAM: PORTABLE CHEST 1 VIEW COMPARISON:  Oct 06, 2022. FINDINGS: Stable cardiomegaly. Tip of intra-aortic balloon catheter is not seen currently. Stable bilateral lung opacities are noted, right greater than left, concerning for pulmonary edema or possibly multifocal pneumonia. Small bilateral pleural effusions are noted. Bony thorax is unremarkable. IMPRESSION: Stable bilateral lung opacities and pleural effusions as described above. Electronically Signed   By: Lupita Raider M.D.   On: 10/13/2022 11:02    Cardiac Studies   Echo 10/05/22 (prior admission)    1. Presence of apical swirling/sludge with definity contrast without  evidence of formed thrombus. Left  ventricular ejection fraction, by  estimation, is 20 to 25%. The left ventricle has severely decreased  function. The left ventricle demonstrates global   hypokinesis. Left ventricular diastolic parameters are consistent with  Grade II diastolic dysfunction (pseudonormalization).   2. Right ventricular systolic function is normal. The right ventricular  size is normal. There is moderately elevated pulmonary artery systolic  pressure. The estimated right ventricular systolic pressure is 59.1 mmHg.   3. The mitral valve is grossly normal. Trivial mitral valve  regurgitation.   4. Tricuspid valve regurgitation is mild to moderate.   5. The aortic valve is calcified. There is moderate calcification of the  aortic valve. There is moderate thickening of the aortic valve. Aortic  valve regurgitation is mild. Mild aortic valve stenosis. Aortic valve  area, by VTI measures 1.11 cm. Aortic  valve mean gradient measures 10.0 mmHg. Aortic valve Vmax measures 2.06  m/s.   6. The inferior vena cava is dilated in size with <50% respiratory  variability, suggesting right atrial pressure of 15 mmHg.    R/LHC 10/05/22 (prior admission)   1.  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  Severe proximal and mid LAD stenoses of 90% 3.  Severe obtuse marginal stenosis of 95% 4.  Nondominant RCA, not visualized 5.  Severe segmental LV systolic dysfunction with LVEF estimated at 30% 6.  Mild aortic insufficiency assessed by supravalvular aortogram 7.  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  Successful intra-aortic balloon pump insertion   Recommendations: IV heparin, continue IABP 1:1, cardiac surgical consultation.  Difficult situation in this elderly patient, will need heart team approach to his care.    Dominance: Left   Patient Profile     82 y.o. male with a hx of CAD (95% L main, 90% prox and mid LAD treated medically), HFrEF, mild aortic stenosis, CKD, DM,  and Interstitial lung disease who presented with worsening shortness of breath and admitted for CHF.  Assessment & Plan    Acute on Chronic HFrEF ICM Acute hypoxic respiratory failure ILD -- presented with worsening shortness of breath over the past 2 days despite being compliant with home medications -- BNP 1954, CXR with bilateral small pleural effusion -- receiving IV lasix, 40mg  daily, 1.6L UOP -- will continue IV lasix this morning -- GDMT: has been very limited in the setting of soft BP, has  been transitioned from coreg to Toprol XL 12.5mg  daily for more BP room. Maybe could add low dose spiro prior to DC  => Have changed to hold parameters to try to make sure he gets his his Toprol dose.  We need to see if he is able to tolerate before he goes home.  Will add Primatene 2.5 mg 3 times daily to help with additional blood pressure.  This will allow [place] to potentially use Toprol and continue diuresis. Needs additional diuresis, will probably place on oral Lasix in the morning.  CAD Elevated Troponin -- cath on 5/20 with severe LM disease of 95%, 90% p/mLAD, 95% 1st OM. Collective decision made to treat medically during that admission -- hsTn 7238>>5770 in the setting of known severe CAD and CHF exacerbation  -- thankfully, no recurrent anginal symptoms -- continue ASA, statin, plavix Converting from carvedilol to Toprol for less BP lowering effect.  CKD III -- initial Cr 1.35, now improved 1.16 with diuresis.  Palliative medicine consulted to assist with plan for outpatient care, ideally reduce hospital readmission  At this point, I do not think observation is appropriate and that I think we need to get him on a stable regimen that he is actually able to tolerate going forward.  The plan is for palliative care with symptom relief.  If and when it comes to the point where Cornelius Moras were no longer able to provide symptom relief, then I think hospice care would be warranted.  He has severe  inoperable left main and other month multivessel disease that is to the severity that I would not expect him to go tolerate too much of any activity.  Adding midodrine to help with providing additional blood pressure to allow for mild beta-blocker use.  Continue to diurese gently and switch to p.o. tomorrow.  If BP will allow, would then add low-dose nitrate prior to discharge.  Adding Ranexa now.  For questions or updates, please contact [facility] Please consult [email or web address] for contact info under      Signed, Laverda Page, NP  10/14/2022, 8:24 AM      ATTENDING ATTESTATION  I have seen, examined and evaluated the patient this AM on rounds along with Laverda Page, NP-C.   After reviewing all the available data and chart, we discussed the patients laboratory, study & physical findings as well as symptoms in detail.  I agree with her findings, examination as well as impression recommendations as per our discussion.    Attending adjustments noted in italics.   Probably still requires another day or 2 of working on medications since months of medication has been held.  Difficult to determine medicine regimen if he is not getting the medicines given to him.  We have changed hold parameters and hopefully this will adjust totally assess his.    Marykay Lex, MD, MS Bryan Lemma, M.D., M.S. Interventional Cardiologist  [facility]  Pager # [phone] Phone # [phone] [address] [place], [place] [address]

## 2022-10-14 NOTE — Progress Notes (Signed)
Pt requested ted hose removed. Throbbing pain to both LE. Tylenol given by previous RN with no relief. Ted hose removed and are at bedside. Pt states "feels better".

## 2022-10-15 ENCOUNTER — Ambulatory Visit: Payer: Medicare Other

## 2022-10-15 DIAGNOSIS — Z515 Encounter for palliative care: Secondary | ICD-10-CM | POA: Diagnosis not present

## 2022-10-15 DIAGNOSIS — Z7189 Other specified counseling: Secondary | ICD-10-CM | POA: Diagnosis not present

## 2022-10-15 DIAGNOSIS — I5043 Acute on chronic combined systolic (congestive) and diastolic (congestive) heart failure: Secondary | ICD-10-CM | POA: Diagnosis not present

## 2022-10-15 DIAGNOSIS — J9601 Acute respiratory failure with hypoxia: Secondary | ICD-10-CM

## 2022-10-15 LAB — BASIC METABOLIC PANEL
Anion gap: 7 (ref 5–15)
BUN: 19 mg/dL (ref 8–23)
CO2: 26 mmol/L (ref 22–32)
Calcium: 8.3 mg/dL — ABNORMAL LOW (ref 8.9–10.3)
Chloride: 101 mmol/L (ref 98–111)
Creatinine, Ser: 1.31 mg/dL — ABNORMAL HIGH (ref 0.61–1.24)
GFR, Estimated: 54 mL/min — ABNORMAL LOW (ref >60)
Glucose, Bld: 111 mg/dL — ABNORMAL HIGH (ref 70–99)
Potassium: 4.2 mmol/L (ref 3.5–5.1)
Sodium: 134 mmol/L — ABNORMAL LOW (ref 135–145)

## 2022-10-15 LAB — GLUCOSE, CAPILLARY
Glucose-Capillary: 88 mg/dL (ref 70–99)
Glucose-Capillary: 123 mg/dL — ABNORMAL HIGH (ref 70–99)
Glucose-Capillary: 94 mg/dL (ref 70–99)
Glucose-Capillary: 103 mg/dL — ABNORMAL HIGH (ref 70–99)

## 2022-10-15 MED ORDER — ISOSORBIDE MONONITRATE ER 30 MG PO TB24
15.0000 mg | ORAL_TABLET | Freq: Every day | ORAL | Status: DC
  Administered 2022-10-15: 15 mg via ORAL
  Filled 2022-10-15: qty 1

## 2022-10-15 MED ORDER — FUROSEMIDE 40 MG PO TABS
40.0000 mg | ORAL_TABLET | Freq: Every day | ORAL | Status: DC
  Administered 2022-10-15 – 2022-10-16 (×2): 40 mg via ORAL
  Filled 2022-10-15 (×2): qty 1

## 2022-10-15 NOTE — TOC Initial Note (Addendum)
Transition of Care [facility]) - Initial/Assessment Note    Patient Details  Name: [name] Steele MRN: [id number] Date of Birth: 1940-12-02  Transition [facility]) CM/SW Contact:    Leone Haven, RN Phone Number: 10/15/2022, 4:21 PM  Clinical Narrative:                 From home with wife, per wife at the bedside, he has a walker and BSC at home. She is his support person.  She states they are active with [facility], this was confirmed with Kandee Keen, active for HHRN, HHPT, HHOT. Will need orders to resume.  Wife will transport him home at discharge. He has PCP, Dr. Renne Crigler.  NCM received referral for outpatient palliative services.  NCM offered choice, wife chose [facility] for outpatient palliative services.  NCM made referral to Revonda Standard with [facility] for outpatient pall services.  She gets his long term meds from [facility], [facility] [address]  He has insurance on file.         Patient Goals and CMS Choice            Expected Discharge Plan and Services                                              Prior Living Arrangements/Services                       Activities of Daily Living Home Assistive Devices/Equipment: None ADL Screening (condition at time of admission) Patient's cognitive ability adequate to safely complete daily activities?: Yes Is the patient deaf or have difficulty hearing?: Yes Does the patient have difficulty seeing, even when wearing glasses/contacts?: No Does the patient have difficulty concentrating, remembering, or making decisions?: No Patient able to express need for assistance with ADLs?: Yes Does the patient have difficulty dressing or bathing?: Yes Independently performs ADLs?: Yes (appropriate for developmental age) Does the patient have difficulty walking or climbing stairs?: Yes Weakness of Legs: Both Weakness of Arms/Hands: None  Permission Sought/Granted                   Emotional Assessment              Admission diagnosis:  Acute systolic congestive heart failure (HCC) [I50.21] Heart failure (HCC) [I50.9] Acute on chronic combined systolic (congestive) and diastolic (congestive) heart failure (HCC) [I50.43] Patient Active Problem List   Diagnosis Date Noted   Acute on chronic combined systolic (congestive) and diastolic (congestive) heart failure (HCC) 10/14/2022   Heart failure (HCC) 10/13/2022   Acute 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   Ischemic cardiomyopathy 10/10/2022   Mild aortic stenosis 10/10/2022   NSTEMI (non-ST elevated myocardial infarction) (HCC) 10/05/2022   Near syncope 02/06/2022   E. coli UTI 02/06/2022   Infrarenal abdominal aortic aneurysm (AAA) without rupture (HCC) 02/06/2022   Hypokalemia 02/06/2022   Normocytic anemia 02/06/2022   Coronary artery disease involving native coronary artery of native heart with angina pectoris (HCC) 04/14/2021   Heart murmur 04/14/2021   Nonspecific abnormal electrocardiogram (ECG) (EKG) 04/14/2021   Lower extremity edema 04/14/2021   Acute cholecystitis 07/04/2018   E coli bacteremia 04/11/2017   UTI (urinary tract infection) 04/10/2017   Sepsis (HCC) 04/10/2017   Type 2 diabetes mellitus without complication (HCC) 04/10/2017  Essential hypertension 04/10/2017   PCP:  [name], MD [facility]   [facility] [address] [place] - [address] [place] [address] Phone: [phone] Fax: [phone]  [facility] [place], [place] - [facility] AT Portal to Registered [address] [place] [address] Phone: [phone] Fax: [phone]  [facility] [address] [place] [address] Phone: [phone] Fax: [phone]     Social Determinants of Health (SDOH) Social History: SDOH Screenings   Food Insecurity: No  Food Insecurity (10/13/2022)  Housing: Low Risk  (10/13/2022)  Transportation Needs: No Transportation Needs (10/13/2022)  Utilities: Not At Risk (10/13/2022)  Alcohol Screen: Low Risk  (10/07/2022)  Financial Resource Strain: Low Risk  (10/07/2022)  Tobacco Use: Medium Risk (10/13/2022)   SDOH Interventions:     Readmission Risk Interventions     No data to display

## 2022-10-15 NOTE — Progress Notes (Signed)
   Heart Failure Stewardship Pharmacist Progress Note   PCP: [name], MD PCP-Cardiologist: Donato Schultz, MD    HPI:  82 yo M with PMH of CHF, CKD, T2DM, mild AS, ILD, and HTN.   Presented to the ED on 5/20 with shortness of breath and chest pain. EKG showed diffuse ST depressions with AVR elevation. Troponin >2K, BNP elevated, hypotensive, LE edema. States he is compliant with his medications. Had a positive stress test in 06/2022 and CAC was 6000. Taken to the cath lab and found 95% stenosis of left main, 90% stenosis of prox and mid LAD and 95% stenosis of obtuse marginal. LVEF ~30% by LV gram. IABP was placed for hemodynamic support. CT surgery consulted for possible CABG. Formal ECHO 5/20 showed LVEF 20-25%, global hypokinesis, G2DD, RV normal, elevated PA pressure, trivial MR, mild AR, mild AS. Ultimately, joint decision was made to not pursue surgical revascularization. IABP wean and removed on 5/21. Discharged on 5/25 on carvedilol, lasix, aspirin, plavix, and crestor.  Unfortunately, back in the ED on 5/28 with shortness of breath, hypoxia, LE edema, and orthopnea. CXR with stable bilateral lung opacities and pleural effusions. BNP elevated.   Current HF Medications: Diuretic: furosemide 40 mg IV daily Beta Blocker: metoprolol XL 12.5 mg daily *also on midodrine 2.5 mg TID  Prior to admission HF Medications: Diuretic: furosemide 40 mg daily Beta blocker: carvedilol 3.125 mg BID  Pertinent Lab Values: Serum creatinine 1.31, BUN 19, Potassium 4.2, Sodium 134, BNP 1954.1, Magnesium 1.8  Vital Signs: Weight: 156 lbs (admission weight: 154 lbs). Weight was only documented on date of admission last hospitalization Blood pressure: 90/60s  Heart rate: 70-80s  I/O: minimal output documented yesterday; net -2L  Medication Assistance / Insurance Benefits Check: Does the patient have prescription insurance?  Yes Type of insurance plan: Medicare  Outpatient Pharmacy:  [facility] the [facility] willing to use [facility] TOC pharmacy at discharge? Yes Is the patient willing to transition their outpatient pharmacy to utilize a [facility] outpatient pharmacy?   No    Assessment: 1. 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 NYHA class III symptoms. - Continue furosemide 40 mg IV daily. Dose was held yesterday due to hypotension. He is 1lb up and minimal urine output yesterday. Strict I/Os and daily weights. Encourage daily weights this admission since last hospitalization only had date of admission weight recorded. Compression stockings placed to assist with diuresis. Will likely need more lasix at discharge.  - Continue metoprolol XL 12.5 mg daily  - GDMT limited by hypotension - started on midodrine 5/29 - Holding SGLT2i - just completed outpatient antibiotics for UTI   Plan: 1) Medication changes recommended at this time: - Continue IV diuresis   2) Patient assistance: Sherryll Burger copay $168 Marcelline Deist copay $17.97 - Jardiance copay $150.05  3)  Education  - Initial education completed - Full education to be completed prior to discharge  Sharen Hones, PharmD, BCPS Heart Failure Stewardship Pharmacist Phone [phone]

## 2022-10-15 NOTE — Progress Notes (Signed)
[facility] 3E23 [facility]) Hospital Liaison Note  Notified by [facility], Letha Cape, of patient/family interest for [facility] Palliative care services at home after discharge.  Tentative discharge plans are for 5.31.  [facility] liaison will follow tomorrow to confirm.  Please call with any hospice or outpatient palliative care related questions.  Doreatha Martin, RN, BSN [facility] Liaison [phone]

## 2022-10-15 NOTE — Evaluation (Signed)
Physical Therapy Evaluation Patient Details Name: [name] Steele MRN: [id number] DOB: 1940-10-18 Today's Date: 10/15/2022  History of Present Illness  Pt is a 82 year old male admitted on 10/13/22 for evaluation of heart failure and elevated troponin at the request of Dr. Suezanne Jacquet. 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  Clinical Impression  Pt presents with admitting diagnosis above. Pt today was able to ambulate Mod I with RW in hallway. Pt present near baseline mobility and anticipate 1 additional visit for stairs then likely DC. Anticipate that pt will not need follow up PT upon DC.       Recommendations for follow up therapy are one component of a multi-disciplinary discharge planning process, led by the attending physician.  Recommendations may be updated based on patient status, additional functional criteria and insurance authorization.  Follow Up Recommendations       Assistance Recommended at Discharge Intermittent Supervision/Assistance  Patient can return home with the following  Help with stairs or ramp for entrance;Assistance with cooking/housework    Equipment Recommendations None recommended by PT  Recommendations for Other Services       Functional Status Assessment Patient has had a recent decline in their functional status and demonstrates the ability to make significant improvements in function in a reasonable and predictable amount of time.     Precautions / Restrictions Precautions Precautions: Fall Restrictions Weight Bearing Restrictions: No      Mobility  Bed Mobility               General bed mobility comments: pt up in recliner upon my entry    Transfers Overall transfer level: Modified independent Equipment used: Rolling walker (2 wheels) Transfers: Sit to/from Stand Sit to Stand: Modified independent (Device/Increase time)                 Ambulation/Gait Ambulation/Gait assistance: Modified independent (Device/Increase time) Gait Distance (Feet): 450 Feet Assistive device: Rolling walker (2 wheels) Gait Pattern/deviations: Step-through pattern, Decreased stride length, Trunk flexed Gait velocity: decr     General Gait Details: no LOB noted.  Stairs            Wheelchair Mobility    Modified Rankin (Stroke Patients Only)       Balance Overall balance assessment: Mild deficits observed, not formally tested                                           Pertinent Vitals/Pain Pain Assessment Pain Assessment: No/denies pain    Home Living Family/patient expects to be discharged to:: Private residence Living Arrangements: Spouse/significant other Available Help at Discharge: Family;Available 24 hours/day Type of Home: House Home Access: Stairs to enter Entrance Stairs-Rails: Right Entrance Stairs-Number of Steps: 1 from garage, 2 at front porch   Home Layout: One level Home Equipment: Hand held shower head;Grab bars - tub/shower;Rollator (4 wheels) Additional Comments: Info same as previous admission 6 days ago.    Prior Function Prior Level of Function : Independent/Modified Independent             Mobility Comments: No assistive device. Pt reports that he was sent him with rollator from previous admission 4 days ago. ADLs Comments: Ind     Hand Dominance   Dominant Hand: Right    Extremity/Trunk Assessment  Upper Extremity Assessment Upper Extremity Assessment: Overall WFL for tasks assessed    Lower Extremity Assessment Lower Extremity Assessment: Overall WFL for tasks assessed    Cervical / Trunk Assessment Cervical / Trunk Assessment: Normal  Communication   Communication: HOH  Cognition Arousal/Alertness: Awake/alert Behavior During Therapy: WFL for tasks assessed/performed Overall Cognitive Status: Within Functional Limits for tasks assessed                                           General Comments General comments (skin integrity, edema, etc.): VSS on RA    Exercises     Assessment/Plan    PT Assessment Patient needs continued PT services  PT Problem List Decreased strength;Decreased balance;Decreased mobility       PT Treatment Interventions DME instruction;Gait training;Stair training;Functional mobility training;Therapeutic activities;Therapeutic exercise;Balance training;Patient/family education    PT Goals (Current goals can be found in the Care Plan section)  Acute Rehab PT Goals Patient Stated Goal: return home PT Goal Formulation: With patient Time For Goal Achievement: 10/29/22 Potential to Achieve Goals: Good    Frequency Min 1X/week     Co-evaluation               AM-PAC PT "6 Clicks" Mobility  Outcome Measure Help needed turning from your back to your side while in a flat bed without using bedrails?: None Help needed moving from lying on your back to sitting on the side of a flat bed without using bedrails?: None Help needed moving to and from a bed to a chair (including a wheelchair)?: None Help needed standing up from a chair using your arms (e.g., wheelchair or bedside chair)?: None Help needed to walk in hospital room?: None Help needed climbing 3-5 steps with a railing? : A Little 6 Click Score: 23    End of Session Equipment Utilized During Treatment: Gait belt Activity Tolerance: Patient tolerated treatment well Patient left: in chair;with call bell/phone within reach;with chair alarm set Nurse Communication: Mobility status PT Visit Diagnosis: Other abnormalities of gait and mobility (R26.89);Muscle weakness (generalized) (M62.81)    Time: 4098-1191 PT Time Calculation (min) (ACUTE ONLY): 22 min   Charges:   PT Evaluation $PT Eval Low Complexity: 1 Low PT Treatments $Gait Training: 8-22 mins        Shela Nevin, PT, DPT Acute Rehab Services [phone]   Gladys Damme 10/15/2022, 3:52 PM

## 2022-10-15 NOTE — Progress Notes (Addendum)
Rounding Note    Patient Name: [name] Steele Date of Encounter: 10/15/2022  [facility] Cardiologist: Donato Schultz, MD   Subjective   Had a rough afternoon yesterday, leg pain with wearing compression stockings.  Feeling much better when I saw him today.  He had just gotten back from a walk.  Did relatively well.  A little bit unsteady/shaky.  But no chest pain or dyspnea.  Definitely felt better than he did this morning.  Was able to tolerate both the Lasix and Toprol.  Inpatient Medications    Scheduled Meds:  aspirin EC  81 mg Oral Daily   clopidogrel  75 mg Oral Daily   furosemide  40 mg Intravenous Daily   heparin  5,000 Units Subcutaneous Q8H   insulin aspart  0-9 Units Subcutaneous TID WC   metoprolol succinate  12.5 mg Oral Daily   midodrine  2.5 mg Oral TID WC   ranolazine  500 mg Oral BID   rosuvastatin  20 mg Oral Daily   Continuous Infusions:  PRN Meds: acetaminophen, nitroGLYCERIN, ondansetron (ZOFRAN) IV   Vital Signs    Vitals:   10/14/22 1935 10/15/22 0002 10/15/22 0541 10/15/22 0715  BP: (!) 86/63 92/64 91/63  99/69  Pulse: 80 80 62 68  Resp:  17 17 18   Temp:  (!) 97.5 F (36.4 C) (!) 97.5 F (36.4 C) 97.7 F (36.5 C)  TempSrc:  Oral Oral Oral  SpO2: 100% 100% 96% 97%  Weight:   71 kg   Height:        Intake/Output Summary (Last 24 hours) at 10/15/2022 0951 Last data filed at 10/15/2022 0855 Gross per 24 hour  Intake 576 ml  Output 700 ml  Net -124 ml      10/15/2022    5:41 AM 10/14/2022    4:43 AM 10/13/2022    3:43 PM  Last 3 Weights  Weight (lbs) 156 lb 8.4 oz 155 lb 6.8 oz 154 lb 8.7 oz  Weight (kg) 71 kg 70.5 kg 70.1 kg      Telemetry    Sinus Rhythm - Personally Reviewed  ECG    No new tracing   Physical Exam   GeN: No acute distress.   Neck: No JVD Cardiac: RRR, harsh 1/6 SEM at RUSB, rubs, or gallops.  Respiratory: Clear to auscultation bilaterally. GI: Soft, nontender, non-distended  MS: mild LE  edema; No deformity. Neuro:  Nonfocal  Psych: Normal affect   Labs    High Sensitivity Troponin:   Recent Labs  Lab 10/05/22 0130 10/05/22 0248 10/13/22 0955 10/13/22 1220  TROPONINIHS 2,206* 2,258* 7,238* 5,770*     Chemistry Recent Labs  Lab 10/13/22 0955 10/14/22 0639 10/15/22 0113  NA 137 137 134*  K 4.3 4.0 4.2  CL 102 103 101  CO2 24 25 26   GLUCOSE 93 98 111*  BUN 19 17 19   CREATININE 1.35* 1.16 1.31*  CALCIUM 8.2* 8.5* 8.3*  MG 1.8  --   --   PROT 6.0*  --   --   ALBUMIN 3.2*  --   --   AST 67*  --   --   ALT 24  --   --   ALKPHOS 52  --   --   BILITOT 0.4  --   --   GFRNONAA 52* >60 54*  ANIONGAP 11 9 7     Lipids No results for input(s): "CHOL", "TRIG", "HDL", "LABVLDL", "LDLCALC", "CHOLHDL" in the last 168 hours.  Hematology  Recent Labs  Lab 10/09/22 0241 10/13/22 0955  WBC 4.8 7.7  RBC 3.60* 3.76*  HGB 11.3* 11.7*  HCT 35.6* 38.4*  MCV 98.9 102.1*  MCH 31.4 31.1  MCHC 31.7 30.5  RDW 14.2 13.9  PLT 221 306   Thyroid No results for input(s): "TSH", "FREET4" in the last 168 hours.  BNP Recent Labs  Lab 10/13/22 0955  BNP 1,954.1*    DDimer No results for input(s): "DDIMER" in the last 168 hours.   Radiology    DG Chest Port 1 View  Result Date: 10/13/2022 CLINICAL DATA:  Shortness of breath. EXAM: PORTABLE CHEST 1 VIEW COMPARISON:  Oct 06, 2022. FINDINGS: Stable cardiomegaly. Tip of intra-aortic balloon catheter is not seen currently. Stable bilateral lung opacities are noted, right greater than left, concerning for pulmonary edema or possibly multifocal pneumonia. Small bilateral pleural effusions are noted. Bony thorax is unremarkable. IMPRESSION: Stable bilateral lung opacities and pleural effusions as described above. Electronically Signed   By: Lupita Raider M.D.   On: 10/13/2022 11:02    Cardiac Studies   Echo 10/05/22 (prior admission)    1. Presence of apical swirling/sludge with definity contrast without  evidence of formed  thrombus. Left ventricular ejection fraction, by  estimation, is 20 to 25%. The left ventricle has severely decreased  function. The left ventricle demonstrates global   hypokinesis. Left ventricular diastolic parameters are consistent with  Grade II diastolic dysfunction (pseudonormalization).   2. Right ventricular systolic function is normal. The right ventricular  size is normal. There is moderately elevated pulmonary artery systolic  pressure. The estimated right ventricular systolic pressure is 59.1 mmHg.   3. The mitral valve is grossly normal. Trivial mitral valve  regurgitation.   4. Tricuspid valve regurgitation is mild to moderate.   5. The aortic valve is calcified. There is moderate calcification of the  aortic valve. There is moderate thickening of the aortic valve. Aortic  valve regurgitation is mild. Mild aortic valve stenosis. Aortic valve  area, by VTI measures 1.11 cm. Aortic  valve mean gradient measures 10.0 mmHg. Aortic valve Vmax measures 2.06  m/s.   6. The inferior vena cava is dilated in size with <50% respiratory  variability, suggesting right atrial pressure of 15 mmHg.    R/LHC 10/05/22 (prior admission)   1.  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  Severe proximal and mid LAD stenoses of 90% 3.  Severe obtuse marginal stenosis of 95% 4.  Nondominant RCA, not visualized 5.  Severe segmental LV systolic dysfunction with LVEF estimated at 30% 6.  Mild aortic insufficiency assessed by supravalvular aortogram 7.  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  Successful intra-aortic balloon pump insertion   Recommendations: IV heparin, continue IABP 1:1, cardiac surgical consultation.  Difficult situation in this elderly patient, will need heart team approach to his care.    Dominance: Left   Patient Profile     82 y.o. male  with a hx of CAD (95% L main, 90% prox and mid LAD treated medically), HFrEF, mild aortic  stenosis, CKD, DM, and Interstitial lung disease who presented with worsening shortness of breath and admitted for CHF.   Assessment & Plan    Acute hypoxic respiratory failure -secondary to Acute on Chronic HFrEF/ICM, complicated by ILD, and Left Main CAD -- presented with worsening shortness of breath over the past 2 days despite being compliant with home medications -- BNP 1954, CXR with bilateral small pleural  effusion -- receiving IV lasix 40mg  daily, 1.6L UOP (held yesterday in the setting of softer blood pressures) -- will continue IV lasix this morning, BP better (placed on midodrine 2.5mg  TID yesterday per MD) Tolerated IHD Lasix today, will convert to 40 p.o. Lasix tomorrow.  Need to determine his home dose. -- GDMT: has been very limited in the setting of soft BP, has been transitioned from coreg to Toprol XL 12.5mg  daily for more BP room. Maybe could add low dose spiro prior to DC   CAD with angina Elevated Troponin -> from demand ischemia -- cath on 5/20 with severe LM disease of 95%, 90% p/mLAD, 95% 1st OM. Collective decision made to treat medically during that admission. Remains DNR -- hsTn 7238>>5770 in the setting of known severe CAD and CHF exacerbation  -- thankfully, no recurrent anginal symptoms -- continue ASA, statin, plavix. Toprol XL 12.5 mg every morning, Ranexa 500mg  BID => will attempt to add Imdur 15 mg nightly   CKD III -> stable -- initial Cr 1.35>>1.16>>1.31  For questions or updates, please contact [facility] Please consult [email or web address] for contact info under        Signed, Laverda Page, NP  10/15/2022, 9:51 AM     ATTENDING ATTESTATION  I have seen, examined and evaluated the patient this morning on rounds along with Roselyn Meier, NP.   After reviewing all the available data and chart, we discussed the patients laboratory, study & physical findings as well as symptoms in detail.  I agree with her findings, examination as well as  impression recommendations as per our discussion.    Attending adjustments noted in italics.   Looks a lot better today.  We has a little bit more medications to tweak.  He barely got medicines today.  Lots of medicines are held.  Need to ensure that nursing staff are giving his medications appropriately. Hopefully if he is able to tolerate meds 1 more day of day, plan will be to try discharge tomorrow   Marykay Lex, MD, MS Bryan Lemma, M.D., M.S. Interventional Cardiologist  [facility]  Pager # [phone] Phone # [phone] [address] [place], [place] [address]

## 2022-10-16 ENCOUNTER — Telehealth: Payer: Self-pay | Admitting: Cardiology

## 2022-10-16 ENCOUNTER — Other Ambulatory Visit (HOSPITAL_COMMUNITY): Payer: Self-pay

## 2022-10-16 DIAGNOSIS — N1832 Chronic kidney disease, stage 3b: Secondary | ICD-10-CM

## 2022-10-16 DIAGNOSIS — N183 Chronic kidney disease, stage 3 unspecified: Secondary | ICD-10-CM | POA: Insufficient documentation

## 2022-10-16 DIAGNOSIS — E118 Type 2 diabetes mellitus with unspecified complications: Secondary | ICD-10-CM

## 2022-10-16 DIAGNOSIS — I10 Essential (primary) hypertension: Secondary | ICD-10-CM

## 2022-10-16 DIAGNOSIS — I25119 Atherosclerotic heart disease of native coronary artery with unspecified angina pectoris: Secondary | ICD-10-CM

## 2022-10-16 DIAGNOSIS — I255 Ischemic cardiomyopathy: Secondary | ICD-10-CM

## 2022-10-16 DIAGNOSIS — I5043 Acute on chronic combined systolic (congestive) and diastolic (congestive) heart failure: Secondary | ICD-10-CM | POA: Diagnosis not present

## 2022-10-16 DIAGNOSIS — J849 Interstitial pulmonary disease, unspecified: Secondary | ICD-10-CM | POA: Insufficient documentation

## 2022-10-16 DIAGNOSIS — I350 Nonrheumatic aortic (valve) stenosis: Secondary | ICD-10-CM | POA: Insufficient documentation

## 2022-10-16 DIAGNOSIS — I5023 Acute on chronic systolic (congestive) heart failure: Principal | ICD-10-CM | POA: Insufficient documentation

## 2022-10-16 DIAGNOSIS — J9621 Acute and chronic respiratory failure with hypoxia: Secondary | ICD-10-CM

## 2022-10-16 LAB — GLUCOSE, CAPILLARY
Glucose-Capillary: 71 mg/dL (ref 70–99)
Glucose-Capillary: 136 mg/dL — ABNORMAL HIGH (ref 70–99)
Glucose-Capillary: 92 mg/dL (ref 70–99)

## 2022-10-16 LAB — BASIC METABOLIC PANEL
Anion gap: 10 (ref 5–15)
BUN: 24 mg/dL — ABNORMAL HIGH (ref 8–23)
CO2: 22 mmol/L (ref 22–32)
Calcium: 8 mg/dL — ABNORMAL LOW (ref 8.9–10.3)
Chloride: 102 mmol/L (ref 98–111)
Creatinine, Ser: 1.49 mg/dL — ABNORMAL HIGH (ref 0.61–1.24)
GFR, Estimated: 47 mL/min — ABNORMAL LOW (ref >60)
Glucose, Bld: 93 mg/dL (ref 70–99)
Potassium: 4.2 mmol/L (ref 3.5–5.1)
Sodium: 134 mmol/L — ABNORMAL LOW (ref 135–145)

## 2022-10-16 MED ORDER — ORAL CARE MOUTH RINSE: 15.0000 mL | OROMUCOSAL | Status: DC | PRN

## 2022-10-16 MED ORDER — METOPROLOL SUCCINATE ER 25 MG PO TB24
12.5000 mg | ORAL_TABLET | Freq: Every day | ORAL | 3 refills | Status: DC
  Filled 2022-10-16: qty 15, 30d supply, fill #0

## 2022-10-16 MED ORDER — FUROSEMIDE 80 MG PO TABS
80.0000 mg | ORAL_TABLET | Freq: Every day | ORAL | 6 refills | Status: DC
  Filled 2022-10-16: qty 30, 30d supply, fill #0

## 2022-10-16 MED ORDER — FUROSEMIDE 40 MG PO TABS: 80.0000 mg | ORAL_TABLET | Freq: Every day | ORAL | Status: DC

## 2022-10-16 MED ORDER — MIDODRINE HCL 2.5 MG PO TABS
2.5000 mg | ORAL_TABLET | Freq: Three times a day (TID) | ORAL | 3 refills | Status: DC
  Filled 2022-10-16: qty 90, 30d supply, fill #0

## 2022-10-16 MED ORDER — ISOSORBIDE MONONITRATE ER 30 MG PO TB24
15.0000 mg | ORAL_TABLET | Freq: Every day | ORAL | 6 refills | Status: DC
  Filled 2022-10-16: qty 15, 30d supply, fill #0

## 2022-10-16 MED ORDER — RANOLAZINE ER 500 MG PO TB12
500.0000 mg | ORAL_TABLET | Freq: Two times a day (BID) | ORAL | 3 refills | Status: DC
  Filled 2022-10-16: qty 60, 30d supply, fill #0

## 2022-10-16 MED ORDER — NITROGLYCERIN 0.4 MG SL SUBL
0.4000 mg | SUBLINGUAL_TABLET | SUBLINGUAL | 3 refills | Status: DC | PRN
  Filled 2022-10-16: qty 25, 7d supply, fill #0

## 2022-10-16 MED ORDER — FUROSEMIDE 40 MG PO TABS
40.0000 mg | ORAL_TABLET | Freq: Once | ORAL | Status: AC
  Administered 2022-10-16: 40 mg via ORAL
  Filled 2022-10-16: qty 1

## 2022-10-16 MED ORDER — FUROSEMIDE 40 MG PO TABS
40.0000 mg | ORAL_TABLET | ORAL | 3 refills | Status: DC | PRN
  Filled 2022-10-16: qty 30, 15d supply, fill #0

## 2022-10-16 NOTE — TOC Transition Note (Signed)
Transition of Care [facility]) - CM/SW Discharge Note   Patient Details  Name: [name] Steele MRN: [id number] Date of Birth: [birth year]  [facility] of [facility]) CM/SW Contact:  Leone Haven, RN Phone Number: 10/16/2022, 4:40 PM   Clinical Narrative:    Patient is for dc today, NCM notified Kandee Keen with [facility] and Revonda Standard with [facility] for outpatient pall services.  Wife will transport him home.    Final next level of care: Home w Home Health Services Barriers to Discharge: No Barriers Identified   Patient Goals and CMS Choice CMS Medicare.gov Compare Post Acute Care list provided to:: Patient Represenative (must comment) Choice offered to / list presented to : Spouse  Discharge Placement                         Discharge Plan and Services Additional resources added to the After Visit Summary for                    DME Agency: NA                  Social Determinants of Health (SDOH) Interventions SDOH Screenings   Food Insecurity: No Food Insecurity (10/13/2022)  Housing: Low Risk  (10/13/2022)  Transportation Needs: No Transportation Needs (10/13/2022)  Utilities: Not At Risk (10/13/2022)  Alcohol Screen: Low Risk  (10/07/2022)  Financial Resource Strain: Low Risk  (10/07/2022)  Tobacco Use: Medium Risk (10/13/2022)     Readmission Risk Interventions     No data to display

## 2022-10-16 NOTE — Plan of Care (Signed)
  Problem: Education: Goal: Understanding of cardiac disease, CV risk reduction, and recovery process will improve Outcome: Progressing   Problem: Activity: Goal: Ability to tolerate increased activity will improve Outcome: Progressing   Problem: Cardiac: Goal: Ability to achieve and maintain adequate cardiovascular perfusion will improve Outcome: Progressing   Problem: Activity: Goal: Ability to return to baseline activity level will improve Outcome: Progressing   Problem: Cardiovascular: Goal: Ability to achieve and maintain adequate cardiovascular perfusion will improve Outcome: Progressing   Problem: Health Behavior/Discharge Planning: Goal: Ability to safely manage health-related needs after discharge will improve Outcome: Progressing   Problem: Coping: Goal: Ability to adjust to condition or change in health will improve Outcome: Progressing   Problem: Fluid Volume: Goal: Ability to maintain a balanced intake and output will improve Outcome: Progressing   Problem: Health Behavior/Discharge Planning: Goal: Ability to identify and utilize available resources and services will improve Outcome: Progressing   Problem: Skin Integrity: Goal: Risk for impaired skin integrity will decrease Outcome: Progressing   Problem: Education: Goal: Knowledge of General Education information will improve Description: Including pain rating scale, medication(s)/side effects and non-pharmacologic comfort measures Outcome: Progressing   Problem: Health Behavior/Discharge Planning: Goal: Ability to manage health-related needs will improve Outcome: Progressing   Problem: Clinical Measurements: Goal: Will remain free from infection Outcome: Progressing Goal: Diagnostic test results will improve Outcome: Progressing Goal: Respiratory complications will improve Outcome: Progressing Goal: Cardiovascular complication will be avoided Outcome: Progressing   Problem: Activity: Goal:  Risk for activity intolerance will decrease Outcome: Progressing   Problem: Coping: Goal: Level of anxiety will decrease Outcome: Progressing   Problem: Elimination: Goal: Will not experience complications related to bowel motility Outcome: Progressing Goal: Will not experience complications related to urinary retention Outcome: Progressing   Problem: Pain Managment: Goal: General experience of comfort will improve Outcome: Progressing

## 2022-10-16 NOTE — Discharge Summary (Cosign Needed)
Discharge Summary    Patient ID: [name] Steele MRN: [id number]; DOB: September 07, 1940  Admit date: 10/13/2022 Discharge date: 10/17/2022  PCP:  [name], MD   [facility] Providers Cardiologist:  Donato Schultz, MD   {   Discharge Diagnoses    Principal Problem:   Acute on chronic combined systolic (congestive) and diastolic (congestive) heart failure (HCC) Active Problems:   Coronary artery disease involving native coronary artery of native heart with angina pectoris (HCC)   Ischemic cardiomyopathy   Myocardial infarction due to demand ischemia (HCC)   Acute on chronic hypoxic respiratory failure (HCC)   ILD (interstitial lung disease) (HCC)   Mild Aortic stenosis   Essential hypertension   Type 2 diabetes mellitus with complication, without long-term current use of insulin (HCC)   CKD (chronic kidney disease) stage 3, GFR 30-59 ml/min (HCC)   Diagnostic Studies/Procedures    Echo 10/05/22 (prior admission) Severely reduced LV function with EF 20 to 25%.  Apical swelling/sludge suspect potential thrombus.  GR 2 DD.  Moderately elevated PAP (59 mmHg), and RA P of 15 mmHg.Marland Kitchen  Normal RV function.  Normal mitral valve.  Mild to moderate TR.  AoV calcification/mild stenosis (MG 10 mmHg).     R/LHC 10/05/22 (prior admission)   1.  Critical left main disease of 95% -< 2.  Severe proximal and mid LAD stenoses of 90%, 3.  Severe obtuse marginal stenosis of 95%; 4.  Nondominant RCA, not visualized. 5.  Severe segmental LV systolic dysfunction with LVEF ~ 30%; 6.  Mild aortic insufficiency assessed by supravalvular aortogram. 7.  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  Successful intra-aortic balloon pump insertion   Recommendations: IV heparin, continue IABP 1:1, cardiac surgical consultation.  Difficult situation in this elderly patient, will need heart team approach to his care.    Dominance: Left  _____________   History of  Present Illness     [name] Steele is a 82 y.o. male with CAD (95% L main, 90% prox and mid LAD treated medically), chronic HFrEF, mild aortic stenosis, CKD, DM, and Interstitial lung disease who presented with worsening shortness of breath and admitted for CHF.   [name] Steele is an 82 year old male with the above history who was recently admitted on 10/05/22 after NSTEMI and new HFrEF with LVEF of 20-25%. Cath that admission showed 95% L main, 90% prox and mid LAD. IABP was placed. Discussions were had for CABG vs high risk PCI vs medical management and it was decided to continue medical management due to surgical and PCI recovery concerns. He was diuresed and discharged 10/10/22 on lasix 40mg  daily, aspirin, 81mg  daily and coreg 3.125mg  BID. Therapy was limited by blood pressure. Palliative care was consulted during previous admission to discuss goals of care. His DNR status was confirmed. They were not interested in outpatient palliative care at that time.    He presented to the emergency room via EMS 10/13/22 with complaints of shortness of breath. SpO2 was 83% upon EMS arrival which was improved with supplemental oxygen. He reports feeling well post discharge and his family reports increased appetite. He started to feel short of breath the night prior to admission. He was waking up severely short of breath and asked his family to call EMS. He did not have any chest pain. His symptoms improved with oxygen. Labs on admission: Creatinine 1.35, Alt 67, Albumin 3.2. Troponin 7238, BNP 1954.1.      Hospital Course  Consultants:    Acute hypoxic respiratory failure secondary to acute on chronic HFrEF/ischemic cardiomyopathy complicated by ILD and left main CAD Initially presented with worsening complaints of shortness of breath over the past 2 days prior to admission despite being compliant on home medications.  BNP 1954.  Echocardiogram from previous admission showing LVEF of 20 to 25% with apical  swirling.  Has grade 2 diastolic dysfunction.  Moderately elevated pulmonary artery systolic pressure.  RVSP 59.1  He was volume overloaded on exam/imaging with small b/l pleural effusions and has been successfully diuresed with IV Lasix 40mg  twice daily, now back on home Lasix regimen.  GDMT has been difficult to titrate due to borderline soft blood pressures.  All readings have been below 100 systolic. Will discharge on p.o. Lasix 80 mg under a sliding scale principle as noted below.  These instructions will also be noted on his discharge instructions.  Has compression stockings.  Converted from carvedilol to Toprol 12.5 mg which also provides antianginal benefit. BP would not tolerate afterload reduction with ARB/ACE inhibitor or ARNI at this point and neither spirolactone. Consider in the outpatient setting.  Consider SGLT2 outpatient.  Was holding here due to recent UTI. Imdur added and BP tolerated. Continue midodrine 2.5 mg 3 times daily. Extensive education has been provided about daily weights, monitoring s/s of CHF, sodium restriction, and logging of blood pressure.           Discussed the concept of Sliding Scale Lasix:   Sliding scale Lasix: Weigh yourself when you get home from the hospital, then Daily in the Morning. Your dry weight will be what your scale says on the day you return home.  Your home dose of furosemide is 80 mg once daily (I would take it at lunchtime) If you gain more than 3 pounds from dry weight: Increase the Lasix dosing to 80 mg in the morning and 40 mg in the afternoon until weight returns to baseline dry weight. If weight gain is greater than 5 pounds in 2 days: Increased to Lasix 80 mg twice a day and contact the office for further assistance if weight does not go down the next day. If the weight goes down more than 3 pounds from dry weight: Hold Lasix until it returns to baseline dry weight  I will add p.o. Lasix 40 mg as needed on top of his p.o. Lasix 80 mg  daily for sliding scale instructions as noted above.  Prior to admission patient was also on potassium supplement but has not received any here with ongoing diuresis and has stable potassium level of 4.2.  Please recheck BMP at outpatient at follow-up.Will DC potassium supplement.    CAD  Elevated troponin from demand ischemia Cath on 5/20 with severe LM disease of 95%, 90% p/mLAD, 95% 1st OM. Collective decision made to treat medically during that admission. Also received intra aortic balloon pump. Remains DNR. HsTn 7238>>5770 in the setting of known severe CAD and CHF exacerbation. No recurrent anginal symptoms Continue aspirin 81mg  daily, Plavix 75mg  daily (indefinitely), and statin. We have added Ranexa 500 mg twice daily and started Imdur 15 mg nightly Converted from carvedilol to Toprol 12.5 mg every morning. Has nitro PRN  Hyperlipidemia Continue rosuvastatin 20 mg daily. Need to check lipid panel outpatient. LDL 32 1 year ago. Lpa 34.8.   CKD III -> stable Initial Cr 1.35>>1.16>>1.31-> 1.49 today. Likely due from IV diuresis and should improve going forward. Check renal function outpatient.   Mild to  moderate TR, mild AS Continue to monitor.  Aortic mean gradient 10 mmHg.   Patient has been seen and evaluated by myself and Dr. Herbie Baltimore and deemed stable for discharge.  Patient will receive call from office to schedule TOC appointment.  There is no availability currently.  Did the patient have an acute coronary syndrome (MI, NSTEMI, STEMI, etc) this admission?:  No.   The elevated Troponin was due to the acute medical illness (demand ischemia).   The patient will be scheduled for a TOC follow up appointment in 14 days.  A message has been sent to the [facility] and Scheduling Pool at the office where the patient should be seen for follow up.  _____________  Discharge Vitals Blood pressure 98/67, pulse 69, temperature 98 F (36.7 C), temperature source Oral, resp. rate 18, height 5'  9" (1.753 m), weight 71 kg, SpO2 100 %.  Filed Weights   10/14/22 0443 10/15/22 0541 10/16/22 0428  Weight: 70.5 kg 71 kg 71 kg    Labs & Radiologic Studies    CBC No results for input(s): "WBC", "NEUTROABS", "HGB", "HCT", "MCV", "PLT" in the last 72 hours. Basic Metabolic Panel Recent Labs    16/10/96 0113 10/16/22 0028  NA 134* 134*  K 4.2 4.2  CL 101 102  CO2 26 22  GLUCOSE 111* 93  BUN 19 24*  CREATININE 1.31* 1.49*  CALCIUM 8.3* 8.0*   Liver Function Tests No results for input(s): "AST", "ALT", "ALKPHOS", "BILITOT", "PROT", "ALBUMIN" in the last 72 hours. No results for input(s): "LIPASE", "AMYLASE" in the last 72 hours. High Sensitivity Troponin:   Recent Labs  Lab 10/05/22 0130 10/05/22 0248 10/13/22 0955 10/13/22 1220  TROPONINIHS 2,206* 2,258* 7,238* 5,770*    BNP Invalid input(s): "POCBNP" D-Dimer No results for input(s): "DDIMER" in the last 72 hours. Hemoglobin A1C No results for input(s): "HGBA1C" in the last 72 hours. Fasting Lipid Panel No results for input(s): "CHOL", "HDL", "LDLCALC", "TRIG", "CHOLHDL", "LDLDIRECT" in the last 72 hours. Thyroid Function Tests No results for input(s): "TSH", "T4TOTAL", "T3FREE", "THYROIDAB" in the last 72 hours.  Invalid input(s): "FREET3" _____________  [facility] Chest Port 1 View  Result Date: 10/13/2022 CLINICAL DATA:  Shortness of breath. EXAM: PORTABLE CHEST 1 VIEW COMPARISON:  Oct 06, 2022. FINDINGS: Stable cardiomegaly. Tip of intra-aortic balloon catheter is not seen currently. Stable bilateral lung opacities are noted, right greater than left, concerning for pulmonary edema or possibly multifocal pneumonia. Small bilateral pleural effusions are noted. Bony thorax is unremarkable. IMPRESSION: Stable bilateral lung opacities and pleural effusions as described above. Electronically Signed   By: Lupita Raider M.D.   On: 10/13/2022 11:02   Disposition   Pt is being discharged home today in good  condition.  Follow-up Plans & Appointments     Follow-up Information     [name], MD Follow up.   Specialty: Internal Medicine Contact information: [address] [place] [address] [place] [address] [phone]         Jake Bathe, MD Follow up.   Specialty: Cardiology Why: Somebody from our office will schedule you follow-up with appointment.  Please be sure to look out for this phone call.  If you do not receive a call within 2 days please call our office. Contact information: [address] [place] [address] [phone]         [facility] Follow up.   Specialty: Home Health Services Why: Agency will call you  to set up apt times Contact information: [address] [place] [address] [phone]         [facility] Follow up.   Why: out patient palliative services Contact information: [address] [place] [address] [phone]               Discharge Instructions     Diet - low sodium heart healthy   Complete by: As directed    Discharge instructions   Complete by: As directed    Here are the details of your sliding scale Lasix: Sliding scale Lasix: Weigh yourself when you get home from the hospital, then Daily in the Morning. Your dry weight will be what your scale says on the day you return home.  Your home dose of furosemide is 80 mg once daily (I would take it at lunchtime) ? If you gain more than 3 pounds from dry weight: Increase the Lasix dosing to 80 mg in the morning and 40 mg in the afternoon until weight returns to baseline dry weight. - If weight gain is greater than 5 pounds in 2 days: Increased to Lasix 80 mg twice a day and contact the office for further assistance if weight does not go down the next day. - If the weight goes down more than 3 pounds from dry weight: Hold Lasix until it returns to baseline dry weight.  In addition it is  essential that you take your Plavix as prescribed.  If you run out or come close to running out of refills please make sure to notify our office well in advance.   Face-to-face encounter (required for Medicare/Medicaid patients)   Complete by: As directed    I Abagail Kitchens certify that this patient is under my care and that I, or a nurse practitioner or physician's assistant working with me, had a face-to-face encounter that meets the physician face-to-face encounter requirements with this patient on 10/16/2022. The encounter with the patient was in whole, or in part for the following medical condition(s) which is the primary reason for home health care (List medical condition): Chronic HFrEF, severe CAD.   The encounter with the patient was in whole, or in part, for the following medical condition, which is the primary reason for home health care: HFreF, CAD   I certify that, based on my findings, the following services are medically necessary home health services:  Physical therapy Nursing     Reason for Medically Necessary Home Health Services:  Therapy- Therapeutic Exercises to Increase Strength and Endurance Therapy- Home Adaptation to Facilitate Safety     My clinical findings support the need for the above services: Shortness of breath with activity   Further, I certify that my clinical findings support that this patient is homebound due to: Ambulates short distances less than 300 feet   Home Health   Complete by: As directed    To provide the following care/treatments:  PT OT RN     Increase activity slowly   Complete by: As directed         Discharge Medications   Allergies as of 10/16/2022       Reactions   Erythromycin Other (See Comments)   Severe Abdominal Pain   Penicillins Hives        Medication List     STOP taking these medications    carvedilol 3.125 MG tablet Commonly known as: COREG   colchicine 0.6 MG tablet   nitrofurantoin  (macrocrystal-monohydrate)  100 MG capsule Commonly known as: MACROBID   potassium chloride SA 20 MEQ tablet Commonly known as: KLOR-CON M       TAKE these medications    aspirin EC 81 MG tablet Take 1 tablet (81 mg total) by mouth daily. Swallow whole.   clopidogrel 75 MG tablet Commonly known as: PLAVIX Take 1 tablet (75 mg total) by mouth daily.   CRANBERRY PO Take 1 capsule by mouth daily.   furosemide 40 MG tablet Commonly known as: LASIX Take 1 tablet (40 mg total) by mouth as needed (Take as directed by your sliding scale instructions noted on discharge summary.). What changed:  when to take this reasons to take this   furosemide 80 MG tablet Commonly known as: LASIX Take 1 tablet (80 mg total) by mouth daily at 12 noon. What changed: You were already taking a medication with the same name, and this prescription was added. Make sure you understand how and when to take each.   ipratropium 0.06 % nasal spray Commonly known as: ATROVENT Place 2 sprays into both nostrils 3 (three) times daily as needed for rhinitis.   isosorbide mononitrate 30 MG 24 hr tablet Commonly known as: IMDUR Take 0.5 tablets (15 mg total) by mouth daily.   metFORMIN 750 MG 24 hr tablet Commonly known as: GLUCOPHAGE-XR Take 750 mg by mouth daily after breakfast.   metoprolol succinate 25 MG 24 hr tablet Commonly known as: TOPROL-XL Take 0.5 tablets (12.5 mg total) by mouth daily.   midodrine 2.5 MG tablet Commonly known as: PROAMATINE Take 1 tablet (2.5 mg total) by mouth 3 (three) times daily with meals.   nitroGLYCERIN 0.4 MG SL tablet Commonly known as: NITROSTAT Place 1 tablet (0.4 mg total) under the tongue every 5 (five) minutes x 3 doses as needed for chest pain.   ranolazine 500 MG 12 hr tablet Commonly known as: RANEXA Take 1 tablet (500 mg total) by mouth 2 (two) times daily.   rosuvastatin 20 MG tablet Commonly known as: CRESTOR Take 1 tablet (20 mg total) by mouth  daily.           Outstanding Labs/Studies   Check lipid panel and BMET outpatient.  Duration of Discharge Encounter   Greater than 30 minutes including physician time.  Signed, Abagail Kitchens, PA-C 10/16/2022, 3:55 PM  ATTENDING ATTESTATION  I have seen, examined and evaluated the patient this morning on rounds along with Laverda Page, NP and Abagail Kitchens, PA-C.  After reviewing all the available data and chart, we discussed the patients laboratory, study & physical findings as well as symptoms in detail.  I agree with their findings, examination as well as hospital summary and impression recommendations as per our discussion.    Attending adjustments noted in italics.   I personally saw and examined the patient.  The examination and final assessment plan per my recommendations.  See final progress note for details. Recently discharged after presenting with non-STEMI and acute combined CHF where cardiac catheterization revealed severe left main disease/multivessel disease as well as the echo showing severely reduced EF.  Initially had IABP placement for maximization of diuresis and medical therapy.  He was turned down for CABG and felt to be poor candidate for PCI.  Therefore plan was medical therapy.  Shortly after going home he returned back to the hospital with essentially a combination of acute on chronic combined CHF and hypoxic respiratory failure leading to demand infarction with elevated troponin.  His medical  management was limited by hypotension that led to medications being held.  We converted from carvedilol to Toprol 12.5 mg daily and Imdur 15 mg nightly to avoid hypotension, however he required midodrine to provide enough baseline blood pressure to tolerate these medications.  He was initially diuresed with IV Lasix and then converted to oral Lasix. I have laid out recommendations for sliding scale based on daily weights.  This was reviewed with the patient and his  wife.  During hospitalization he also had continuation of his palliative care consultation at this point I think that the main medical care being provided to him is palliation as he was deemed to be not a surgical candidate and a very available candidate for PCI which would mean Left Main PCI.  During last hospitalization this is a mutual discussion between the patient, his wife and treatment team.  We did not readdress potential for high risk PCI.   I stressed to the patient and his wife that with the severity of his left main disease, even the slightest change in his volume status could put him into pulmonary edema from an ischemic standpoint exacerbating his existing CHF.  He is at risk for recurrent infarction, cardiac arrhythmias and eventually from cardiac cause.     Marykay Lex, MD, MS Bryan Lemma, M.D., M.S. Interventional Cardiologist  [facility]  Pager # [phone] Phone # [phone] [address] [place], [place] [address]

## 2022-10-16 NOTE — Progress Notes (Signed)
Physical Therapy Treatment Patient Details Name: [name] Steele MRN: [id number] DOB: 1940-06-04 Today's Date: 10/16/2022   History of Present Illness Pt is a 82 year old male admitted on 10/13/22 for evaluation of heart failure and elevated troponin at the request of Dr. Suezanne Jacquet. 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    PT Comments    Pt received in supine, agreeable to therapy session and with good participation and tolerance for transfer, gait and stair training with spouse present for guarding instructions. Pt given gait belt for home so spouse can provide assist with stair safety and pt needing min to modA to ascend/descend 2 steps with HHA/no rail per home set-up. Pt otherwise modI using RW for gait/transfers mainly. Knee-high compression socks donned prior to pt sitting EOB and orthostatics stable. Pt continues to benefit from PT services to progress toward functional mobility goals, anticipate pt safe to DC home from a functional mobility standpoint once pt medically cleared.  Orthostatic BPs Supine (taken prior to compression socks donned) 91/66 (75) HR 79 bpm  Sitting (compression socks donned for this reading) 106/72 (83) HR 75 bpm  Standing 101/64 (77) HR WFL     Recommendations for follow up therapy are one component of a multi-disciplinary discharge planning process, led by the attending physician.  Recommendations may be updated based on patient status, additional functional criteria and insurance authorization.  Follow Up Recommendations       Assistance Recommended at Discharge Intermittent Supervision/Assistance  Patient can return home with the following Help with stairs or ramp for entrance;Assistance with cooking/housework   Equipment Recommendations  None recommended by PT    Recommendations for Other Services       Precautions / Restrictions  Precautions Precautions: Fall Restrictions Weight Bearing Restrictions: No     Mobility  Bed Mobility Overal bed mobility: Modified Independent             General bed mobility comments: increased time to perform; HOB flat    Transfers Overall transfer level: Modified independent Equipment used: Rolling walker (2 wheels) Transfers: Sit to/from Stand Sit to Stand: Modified independent (Device/Increase time), Supervision           General transfer comment: cues for safe UE placement with stand>sit, modI sit>stand    Ambulation/Gait Ambulation/Gait assistance: Modified independent (Device/Increase time) Gait Distance (Feet): 300 Feet Assistive device: Rolling walker (2 wheels) Gait Pattern/deviations: Step-through pattern, Decreased stride length Gait velocity: decr     General Gait Details: good RW management, fair posture; no signficant dyspnea, HR to 104 bpm, SpO2 100% on RA throughout   Stairs Stairs: Yes Stairs assistance: Mod assist, Min assist Stair Management: Step to pattern, Forwards (HHA on RUE, pt pushing on wall with LUE as he does not have rails at home) Number of Stairs: 2 General stair comments: single 7" step x2 reps in stairwell to simulate pt home entry single step with no rail, pt states he holds wall. reviewed use of gait belt and guarding with pt spouse   Wheelchair Mobility    Modified Rankin (Stroke Patients Only)       Balance Overall balance assessment: Mild deficits observed, not formally tested  Cognition Arousal/Alertness: Awake/alert Behavior During Therapy: WFL for tasks assessed/performed Overall Cognitive Status: Within Functional Limits for tasks assessed                                          Exercises Other Exercises Other Exercises: IS x 10 reps (~1000-1500) encouraged hourly use    General Comments General comments (skin integrity,  edema, etc.): see gait comments; PTA assisted pt to don compression socks prior to OOB and pt tolerated well; BP stable supine/sitting/standing with compression socks on      Pertinent Vitals/Pain Pain Assessment Pain Assessment: No/denies pain Pain Location: pt spouse states his L toes can be tender to palpation but pt did not express pain when bil compression socks applied.    Home Living                          Prior Function            PT Goals (current goals can now be found in the care plan section) Acute Rehab PT Goals Patient Stated Goal: return home PT Goal Formulation: With patient Time For Goal Achievement: 10/29/22 Progress towards PT goals: Progressing toward goals    Frequency    Min 1X/week      PT Plan Current plan remains appropriate    Co-evaluation              AM-PAC PT "6 Clicks" Mobility   Outcome Measure  Help needed turning from your back to your side while in a flat bed without using bedrails?: None Help needed moving from lying on your back to sitting on the side of a flat bed without using bedrails?: None Help needed moving to and from a bed to a chair (including a wheelchair)?: None Help needed standing up from a chair using your arms (e.g., wheelchair or bedside chair)?: A Little Help needed to walk in hospital room?: None Help needed climbing 3-5 steps with a railing? : A Lot 6 Click Score: 21    End of Session Equipment Utilized During Treatment: Gait belt Activity Tolerance: Patient tolerated treatment well Patient left: in chair;with call bell/phone within reach;with family/visitor present (spouse in room with him) Nurse Communication: Mobility status PT Visit Diagnosis: Other abnormalities of gait and mobility (R26.89);Muscle weakness (generalized) (M62.81)     Time: 5621-3086 PT Time Calculation (min) (ACUTE ONLY): 34 min  Charges:  $Gait Training: 8-22 mins $Therapeutic Activity: 8-22 mins                      Vonzell Lindblad P., PTA Acute Rehabilitation Services Secure Chat Preferred 9a-5:30pm Office: [phone]    Dorathy Kinsman [place] [facility] 10/16/2022, 2:51 PM

## 2022-10-16 NOTE — Discharge Instructions (Signed)
Sliding scale Lasix: Weigh yourself when you get home from the hospital, then Daily in the Morning. Your dry weight will be what your scale says on the day you return home.  Your home dose of furosemide is 80 mg once daily (I would take it at lunchtime) If you gain more than 3 pounds from dry weight: Increase the Lasix dosing to 80 mg in the morning and 40 mg in the afternoon until weight returns to baseline dry weight. If weight gain is greater than 5 pounds in 2 days: Increased to Lasix 80 mg twice a day and contact the office for further assistance if weight does not go down the next day. If the weight goes down more than 3 pounds from dry weight: Hold Lasix until it returns to baseline dry weight

## 2022-10-16 NOTE — Care Management Important Message (Signed)
Important Message  Patient Details  Name: [name] Steele MRN: [id number] Date of Birth: [birth year]   Medicare Important Message Given:  Yes     Renie Ora 10/16/2022, 3:40 PM

## 2022-10-16 NOTE — Progress Notes (Signed)
Rounding Note    Patient Name: [name] Steele Date of Encounter: 10/16/2022  [facility] Cardiologist: Donato Schultz, MD   Subjective   Feels better today.  Has not yet walked, but has not noticed any "swimmy headedness.  No real chest pain.  Breathing much better.  Inpatient Medications    Scheduled Meds:  aspirin EC  81 mg Oral Daily   clopidogrel  75 mg Oral Daily   [START ON 10/17/2022] furosemide  80 mg Oral Q1200   heparin  5,000 Units Subcutaneous Q8H   insulin aspart  0-9 Units Subcutaneous TID WC   isosorbide mononitrate  15 mg Oral Daily   metoprolol succinate  12.5 mg Oral Daily   midodrine  2.5 mg Oral TID WC   ranolazine  500 mg Oral BID   rosuvastatin  20 mg Oral Daily   Continuous Infusions:  PRN Meds: acetaminophen, nitroGLYCERIN, ondansetron (ZOFRAN) IV, mouth rinse   Vital Signs    Vitals:   10/16/22 0428 10/16/22 0955 10/16/22 1030 10/16/22 1127  BP: (!) 85/56 91/60 93/62  97/70  Pulse: 66 79  71  Resp: 18   18  Temp: 98 F (36.7 C)   98 F (36.7 C)  TempSrc: Oral   Oral  SpO2: 99%   100%  Weight: 71 kg     Height:        Intake/Output Summary (Last 24 hours) at 10/16/2022 1240 Last data filed at 10/16/2022 1219 Gross per 24 hour  Intake 496 ml  Output 2200 ml  Net -1704 ml      10/16/2022    4:28 AM 10/15/2022    5:41 AM 10/14/2022    4:43 AM  Last 3 Weights  Weight (lbs) 156 lb 8.4 oz 156 lb 8.4 oz 155 lb 6.8 oz  Weight (kg) 71 kg 71 kg 70.5 kg      Telemetry    Sinus rhythm.- Personally Reviewed  ECG    No new tracing   Physical Exam   GeN: No acute distress.  Resting comfortably in bed.  Looks and feels much better today. Neck: Minimally elevated JVD but no bruit.  Open radiated AoV murmur) Cardiac: RRR, 1/6 SEM at RUSB.  No rubs or gallops.   Respiratory: CTA, nonlabored, good air movement. GI: Soft/NT/ND/NABS.  No HSM. MS: Trivial edema. Neuro: Nonfocal.  A&O x 3 Psych: Normal mood and affect.  Labs     High Sensitivity Troponin:   Recent Labs  Lab 10/05/22 0130 10/05/22 0248 10/13/22 0955 10/13/22 1220  TROPONINIHS 2,206* 2,258* 7,238* 5,770*     Chemistry Recent Labs  Lab 10/13/22 0955 10/14/22 0639 10/15/22 0113 10/16/22 0028  NA 137 137 134* 134*  K 4.3 4.0 4.2 4.2  CL 102 103 101 102  CO2 24 25 26 22   GLUCOSE 93 98 111* 93  BUN 19 17 19  24*  CREATININE 1.35* 1.16 1.31* 1.49*  CALCIUM 8.2* 8.5* 8.3* 8.0*  MG 1.8  --   --   --   PROT 6.0*  --   --   --   ALBUMIN 3.2*  --   --   --   AST 67*  --   --   --   ALT 24  --   --   --   ALKPHOS 52  --   --   --   BILITOT 0.4  --   --   --   GFRNONAA 52* >60 54* 47*  ANIONGAP 11 9  7 10    Lipids No results for input(s): "CHOL", "TRIG", "HDL", "LABVLDL", "LDLCALC", "CHOLHDL" in the last 168 hours.  Hematology Recent Labs  Lab 10/13/22 0955  WBC 7.7  RBC 3.76*  HGB 11.7*  HCT 38.4*  MCV 102.1*  MCH 31.1  MCHC 30.5  RDW 13.9  PLT 306   Thyroid No results for input(s): "TSH", "FREET4" in the last 168 hours.  BNP Recent Labs  Lab 10/13/22 0955  BNP 1,954.1*    DDimer No results for input(s): "DDIMER" in the last 168 hours.   Radiology    No results found.  Cardiac Studies   Echo 10/05/22 (prior admission) Severely reduced LV function with EF 20 to 25%.  Apical swelling/sludge suspect potential thrombus.  GR 2 DD.  Moderately elevated PAP (59 mmHg), and RA P of 15 mmHg.Marland Kitchen  Normal RV function.  Normal mitral valve.  Mild to moderate TR.  AoV calcification/mild stenosis (MG 10 mmHg).    R/LHC 10/05/22 (prior admission)   1.  Critical left main disease of 95% -< 2.  Severe proximal and mid LAD stenoses of 90%, 3.  Severe obtuse marginal stenosis of 95%; 4.  Nondominant RCA, not visualized. 5.  Severe segmental LV systolic dysfunction with LVEF ~ 30%; 6.  Mild aortic insufficiency assessed by supravalvular aortogram. 7.  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   Successful intra-aortic balloon pump insertion   Recommendations: IV heparin, continue IABP 1:1, cardiac surgical consultation.  Difficult situation in this elderly patient, will need heart team approach to his care.    Dominance: Left   Patient Profile     82 y.o. male  with a hx of CAD (95% L main, 90% prox and mid LAD treated medically), HFrEF, mild aortic stenosis, CKD, DM, and Interstitial lung disease who presented with worsening shortness of breath and admitted for CHF.   Assessment & Plan    Acute hypoxic respiratory failure -secondary to Acute on Chronic HFrEF/ICM, complicated by ILD, and Left Main CAD -- presented with worsening shortness of breath over the past 2 days despite being compliant with home medications -- BNP 1954, CXR with bilateral small pleural effusion Initially treated with IV Lasix twice daily.  We are having trouble with him getting medication due to low blood pressures.  However he did receive his diuretic yesterday and had brisk diuresis.   Converted to p.o. Lasix 80 mg today (he got 40 mg initially and give additional 40, has had brisk diuresis. Guideline directed medical therapy very difficult due to borderline blood pressures: Tolerated IHD Lasix today, will convert to 40 p.o. Lasix tomorrow.  Need to determine his home dose. Converted from carvedilol to Toprol 12.5 mg which also provides antianginal benefit. BP would not tolerate afterload reduction with ARB/ACE inhibitor or Arni at this point.  Consider in the outpatient setting. Also BP not sufficient enough for spironolactone but could consider in the outpatient setting. Imdur added last night-tolerated.   Discussed the concept of sliding scale Lasix:  Sliding scale Lasix: Weigh yourself when you get home from the hospital, then Daily in the Morning. Your dry weight will be what your scale says on the day you return home.  Your home dose of furosemide is 80 mg once daily (I would take it at  lunchtime) If you gain more than 3 pounds from dry weight: Increase the Lasix dosing to 80 mg in the morning and 40 mg in the afternoon until weight returns to  baseline dry weight. If weight gain is greater than 5 pounds in 2 days: Increased to Lasix 80 mg twice a day and contact the office for further assistance if weight does not go down the next day. If the weight goes down more than 3 pounds from dry weight: Hold Lasix until it returns to baseline dry weight   CAD with angina Elevated Troponin -> from demand ischemia -- cath on 5/20 with severe LM disease of 95%, 90% p/mLAD, 95% 1st OM. Collective decision made to treat medically during that admission. Remains DNR -- hsTn [id number] in the setting of known severe CAD and CHF exacerbation  -- thankfully, no recurrent anginal symptoms Continue aspirin Plavix and statin. We have added Ranexa 500 mg twice daily and I started Imdur 15 mg nightly Converted from carvedilol to Toprol 12.5 mg every morning.    CKD III -> stable -- initial Cr 1.35>>1.16>>1.31-> 1.49 today. I suspect that the "better numbers "were probably related to him being volume overloaded as he seems clinically much better with higher creatinine after diuresis.   For questions or updates, please contact [facility] Please consult [email or web address] for contact info under    Dispo: Plan to ambulate in the hallway today.  If he does well and his pressure stays stable I think we can discharge him home on current medications.  Sliding scale Lasix as directed above.  He will definitely need TOC follow-up.      Signed, Bryan Lemma, MD  10/16/2022, 12:40 PM    Marykay Lex, MD, MS Bryan Lemma, M.D., M.S. Interventional Cardiologist  [facility]  Pager # [phone] Phone # [phone] [address] [place], [place] [address]

## 2022-10-16 NOTE — Plan of Care (Signed)
Problem: Education: Goal: Understanding of cardiac disease, CV risk reduction, and recovery process will improve 10/16/2022 1622 by [name] Fare, RN Outcome: Adequate for Discharge 10/16/2022 1139 by [name] Fare, RN Outcome: Progressing Goal: Individualized Educational Video(s) Outcome: Adequate for Discharge   Problem: Activity: Goal: Ability to tolerate increased activity will improve 10/16/2022 1622 by [name] Fare, RN Outcome: Adequate for Discharge 10/16/2022 1139 by [name] Fare, RN Outcome: Progressing   Problem: Cardiac: Goal: Ability to achieve and maintain adequate cardiovascular perfusion will improve 10/16/2022 1622 by [name] Fare, RN Outcome: Adequate for Discharge 10/16/2022 1139 by [name] Fare, RN Outcome: Progressing   Problem: Health Behavior/Discharge Planning: Goal: Ability to safely manage health-related needs after discharge will improve Outcome: Adequate for Discharge   Problem: Activity: Goal: Ability to return to baseline activity level will improve 10/16/2022 1622 by [name] Fare, RN Outcome: Adequate for Discharge 10/16/2022 1139 by [name] Fare, RN Outcome: Progressing   Problem: Cardiovascular: Goal: Ability to achieve and maintain adequate cardiovascular perfusion will improve 10/16/2022 1622 by [name] Fare, RN Outcome: Adequate for Discharge 10/16/2022 1139 by [name] Fare, RN Outcome: Progressing Goal: Vascular access site(s) Level 0-1 will be maintained Outcome: Adequate for Discharge   Problem: Health Behavior/Discharge Planning: Goal: Ability to safely manage health-related needs after discharge will improve 10/16/2022 1622 by [name] Fare, RN Outcome: Adequate for Discharge 10/16/2022 1139 by [name] Fare, RN Outcome: Progressing   Problem: Education: Goal: Ability to describe self-care measures that may prevent or decrease complications (Diabetes Survival Skills Education) will  improve Outcome: Adequate for Discharge Goal: Individualized Educational Video(s) Outcome: Adequate for Discharge   Problem: Coping: Goal: Ability to adjust to condition or change in health will improve 10/16/2022 1622 by [name] Fare, RN Outcome: Adequate for Discharge 10/16/2022 1139 by [name] Fare, RN Outcome: Progressing   Problem: Fluid Volume: Goal: Ability to maintain a balanced intake and output will improve 10/16/2022 1622 by [name] Fare, RN Outcome: Adequate for Discharge 10/16/2022 1139 by [name] Fare, RN Outcome: Progressing   Problem: Health Behavior/Discharge Planning: Goal: Ability to identify and utilize available resources and services will improve 10/16/2022 1622 by [name] Fare, RN Outcome: Adequate for Discharge 10/16/2022 1139 by [name] Fare, RN Outcome: Progressing Goal: Ability to manage health-related needs will improve Outcome: Adequate for Discharge   Problem: Metabolic: Goal: Ability to maintain appropriate glucose levels will improve Outcome: Adequate for Discharge   Problem: Nutritional: Goal: Maintenance of adequate nutrition will improve Outcome: Adequate for Discharge Goal: Progress toward achieving an optimal weight will improve Outcome: Adequate for Discharge   Problem: Skin Integrity: Goal: Risk for impaired skin integrity will decrease 10/16/2022 1622 by [name] Fare, RN Outcome: Adequate for Discharge 10/16/2022 1139 by [name] Fare, RN Outcome: Progressing   Problem: Tissue Perfusion: Goal: Adequacy of tissue perfusion will improve Outcome: Adequate for Discharge   Problem: Education: Goal: Knowledge of General Education information will improve Description: Including pain rating scale, medication(s)/side effects and non-pharmacologic comfort measures 10/16/2022 1622 by [name] Fare, RN Outcome: Adequate for Discharge 10/16/2022 1139 by [name] Fare, RN Outcome: Progressing   Problem:  Health Behavior/Discharge Planning: Goal: Ability to manage health-related needs will improve 10/16/2022 1622 by [name] Fare, RN Outcome: Adequate for Discharge 10/16/2022 1139 by [name] Fare, RN Outcome: Progressing   Problem: Clinical Measurements: Goal: Ability to maintain clinical measurements within normal limits will improve Outcome: Adequate for Discharge Goal: Will remain free from infection 10/16/2022 1622 by [name] Fare, RN Outcome: Adequate for Discharge 10/16/2022 1139 by [name] Fare, RN Outcome: Progressing Goal:  Diagnostic test results will improve 10/16/2022 1622 by [name] Fare, RN Outcome: Adequate for Discharge 10/16/2022 1139 by [name] Fare, RN Outcome: Progressing Goal: Respiratory complications will improve 10/16/2022 1622 by [name] Fare, RN Outcome: Adequate for Discharge 10/16/2022 1139 by [name] Fare, RN Outcome: Progressing Goal: Cardiovascular complication will be avoided 10/16/2022 1622 by [name] Fare, RN Outcome: Adequate for Discharge 10/16/2022 1139 by [name] Fare, RN Outcome: Progressing   Problem: Activity: Goal: Risk for activity intolerance will decrease 10/16/2022 1622 by [name] Fare, RN Outcome: Adequate for Discharge 10/16/2022 1139 by [name] Fare, RN Outcome: Progressing   Problem: Nutrition: Goal: Adequate nutrition will be maintained Outcome: Adequate for Discharge   Problem: Coping: Goal: Level of anxiety will decrease 10/16/2022 1622 by [name] Fare, RN Outcome: Adequate for Discharge 10/16/2022 1139 by [name] Fare, RN Outcome: Progressing   Problem: Elimination: Goal: Will not experience complications related to bowel motility 10/16/2022 1622 by [name] Fare, RN Outcome: Adequate for Discharge 10/16/2022 1139 by [name] Fare, RN Outcome: Progressing Goal: Will not experience complications related to urinary retention 10/16/2022 1622 by [name] Fare,  RN Outcome: Adequate for Discharge 10/16/2022 1139 by [name] Fare, RN Outcome: Progressing   Problem: Pain Managment: Goal: General experience of comfort will improve 10/16/2022 1622 by [name] Fare, RN Outcome: Adequate for Discharge 10/16/2022 1139 by [name] Fare, RN Outcome: Progressing   Problem: Safety: Goal: Ability to remain free from injury will improve Outcome: Adequate for Discharge   Problem: Skin Integrity: Goal: Risk for impaired skin integrity will decrease Outcome: Adequate for Discharge   Problem: Education: Goal: Ability to demonstrate management of disease process will improve Outcome: Adequate for Discharge Goal: Ability to verbalize understanding of medication therapies will improve Outcome: Adequate for Discharge Goal: Individualized Educational Video(s) Outcome: Adequate for Discharge   Problem: Activity: Goal: Capacity to carry out activities will improve Outcome: Adequate for Discharge   Problem: Cardiac: Goal: Ability to achieve and maintain adequate cardiopulmonary perfusion will improve Outcome: Adequate for Discharge

## 2022-10-16 NOTE — Consult Note (Signed)
   [facility] CM Inpatient Consult   10/16/2022  [name] Steele Aug 04, 1940 [id number]  [facility] [[facility]]  Accountable Care Organization [ACO] Patient: Medicare ACO REACH  Primary Care Provider:  [name], MD, [facility] is listed as the provider   Patient screened for less than 7 days readmission hospitalization with noted medium risk score for unplanned readmission risk and to assess for potential [facility]  [[facility]] Care Management service needs for post hospital transition for care coordination.  Review of patient's electronic medical record reveals patient is for home with home health and palliative care. Met with the patient at bedside, he was eating lunch, he states he's a "bit hard of hearing. My wife will be here this afternoon sometime. Patient is accepting and pleasant. Reviewed that patient is planning for home health and palliative care to follow as well.  Left an appointment reminder card and a [facility] 24 hour nurse advise line magnet on the bedside table as he will give it to his wife.  12:20 pm Was able to meet with wife at bedside, she endorses post hospital follow up states, "I can use all of the help we can get." [facility] material given in hand.  Plan:  Continue to follow progress and disposition to assess for post hospital community care coordination/management needs.  Referral request for community care coordination: anticipate follow up with post hospital [facility] team  Of note, [facility] Care Management/Population Health does not replace or interfere with any arrangements made by the Inpatient Transition of Care team.  For questions contact:   Charlesetta Shanks, RN BSN CCM [facility]  [phone] business mobile phone Toll free office [phone]  *Concierge Line  [phone] Fax number: [phone] [email or web address]

## 2022-10-16 NOTE — Progress Notes (Signed)
Approximately 1800--Pt discharged to home with wife. At time of discharge, pt A&O x 4 and VSS. AVS discharge instructions provided to both pt and wife at bedside. Teach-back technique utilized. All questions answered at this time. All patient belongings taken with pt, including TOC meds. All PIVs removed. Telemetry removed and notified.  Pt transported out to car in wheelchair.

## 2022-10-16 NOTE — Progress Notes (Signed)
   Heart Failure Stewardship Pharmacist Progress Note   PCP: [name], MD PCP-Cardiologist: Donato Schultz, MD    HPI:  82 yo M with PMH of CHF, CKD, T2DM, mild AS, ILD, and HTN.   Presented to the ED on 5/20 with shortness of breath and chest pain. EKG showed diffuse ST depressions with AVR elevation. Troponin >2K, BNP elevated, hypotensive, LE edema. States he is compliant with his medications. Had a positive stress test in 06/2022 and CAC was 6000. Taken to the cath lab and found 95% stenosis of left main, 90% stenosis of prox and mid LAD and 95% stenosis of obtuse marginal. LVEF ~30% by LV gram. IABP was placed for hemodynamic support. CT surgery consulted for possible CABG. Formal ECHO 5/20 showed LVEF 20-25%, global hypokinesis, G2DD, RV normal, elevated PA pressure, trivial MR, mild AR, mild AS. Ultimately, joint decision was made to not pursue surgical revascularization. IABP wean and removed on 5/21. Discharged on 5/25 on carvedilol, lasix, aspirin, plavix, and crestor.  Unfortunately, back in the ED on 5/28 with shortness of breath, hypoxia, LE edema, and orthopnea. CXR with stable bilateral lung opacities and pleural effusions. BNP elevated.   Current HF Medications: Diuretic: furosemide 40 mg PO daily Beta Blocker: metoprolol XL 12.5 mg daily *also on midodrine 2.5 mg TID  Prior to admission HF Medications: Diuretic: furosemide 40 mg daily Beta blocker: carvedilol 3.125 mg BID  Pertinent Lab Values: Serum creatinine 1.49, BUN 24, Potassium 4.2, Sodium 134, BNP 1954.1, Magnesium 1.8  Vital Signs: Weight: 156 lbs (admission weight: 154 lbs). Weight was only documented on date of admission last hospitalization Blood pressure: 80/60s  Heart rate: 60-70s  I/O: -1.5L yesterday; net -2.7L  Medication Assistance / Insurance Benefits Check: Does the patient have prescription insurance?  Yes Type of insurance plan: Medicare  Outpatient Pharmacy:  Prior to admission outpatient  [facility] Is the patient willing to use [facility] TOC pharmacy at discharge? Yes Is the patient willing to transition their outpatient pharmacy to utilize a [facility] outpatient pharmacy?   No    Assessment: 1. 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 NYHA class II symptoms. - Off IV lasix, has been transitioned to furosemide 40 mg PO daily. May need higher lasix dose for discharge considering his quick readmission. Encourage daily weights this admission since last hospitalization only had date of admission weight recorded. Compression stockings placed to assist with diuresis. - Continue metoprolol XL 12.5 mg daily  - GDMT limited by hypotension - started on midodrine 5/29 - Holding SGLT2i - just completed outpatient antibiotics for UTI   Plan: 1) Medication changes recommended at this time: - Increase furosemide to 80 mg PO daily  2) Patient assistance: Sherryll Burger copay $168 Marcelline Deist copay $17.97 - Jardiance copay $150.05  3)  Education  - Patient has been educated on current HF medications and potential additions to HF medication regimen - Patient verbalizes understanding that over the next few months, these medication doses may change and more medications may be added to optimize HF regimen - Patient has been educated on basic disease state pathophysiology and goals of therapy   Sharen Hones, PharmD, BCPS Heart Failure Stewardship Pharmacist Phone [phone]

## 2022-10-16 NOTE — Telephone Encounter (Signed)
   Transition of Care Follow-up Phone Call Request    Patient Name: [name] Steele Date of Birth: 1941/04/08 Date of Encounter: 10/16/2022  Primary Care Provider:  [name], MD Primary Cardiologist:  Donato Schultz, MD  [name] has been scheduled for a transition of care follow up appointment with a [facility] provider:  Unable to find availability in the next 2 weeks.  Please reach out to patient to schedule TOC appointment.  He has been notified of this phone call.  Please reach out to [facility] within 48 hours to confirm appointment and review transition of care protocol questionnaire.  Abagail Kitchens, PA-C  10/16/2022, 2:35 PM

## 2022-10-17 DIAGNOSIS — J9621 Acute and chronic respiratory failure with hypoxia: Secondary | ICD-10-CM | POA: Diagnosis present

## 2022-10-17 DIAGNOSIS — I21A1 Myocardial infarction type 2: Secondary | ICD-10-CM | POA: Diagnosis present

## 2022-10-19 ENCOUNTER — Telehealth: Payer: Self-pay

## 2022-10-19 DIAGNOSIS — I25119 Atherosclerotic heart disease of native coronary artery with unspecified angina pectoris: Secondary | ICD-10-CM

## 2022-10-19 NOTE — Telephone Encounter (Signed)
Patient contacted regarding discharge from [facility] on 10/16/2022.  Patient understands to follow up with provider Ronie Spies, PA-C on 11/02/2022 at 10:55 AM at [facility] . Patient understands discharge instructions? Yes. Patient understands medications and regiment? Yes. Patient understands to bring all medications to this visit? Yes.  Ask patient:  Are you enrolled in My Chart Yes.

## 2022-10-20 ENCOUNTER — Telehealth: Payer: Self-pay | Admitting: *Deleted

## 2022-10-20 NOTE — Progress Notes (Signed)
  Care Coordination   Note   10/20/2022 Name: [name] Steele MRN: [id number] DOB: Jul 24, 1940  [name] Steele is a 82 y.o. year old male who sees [name], MD for primary care. I reached out to [name] by phone today to offer care coordination services.  Mr. Garness was given information about Care Coordination services today including:   The Care Coordination services include support from the care team which includes your Nurse Coordinator, Clinical Social Worker, or Pharmacist.  The Care Coordination team is here to help remove barriers to the health concerns and goals most important to you. Care Coordination services are voluntary, and the patient may decline or stop services at any time by request to their care team member.   Care Coordination Consent Status: Patient wife [name] Steele  agreed to services and verbal consent obtained.   Follow up plan:  Telephone appointment with care coordination team member scheduled for:  10/28/22  Encounter Outcome:  Pt. Scheduled  [facility] Coordination Care Guide  Direct Dial: [phone]

## 2022-10-21 DIAGNOSIS — I5021 Acute systolic (congestive) heart failure: Secondary | ICD-10-CM | POA: Diagnosis not present

## 2022-10-21 DIAGNOSIS — E1122 Type 2 diabetes mellitus with diabetic chronic kidney disease: Secondary | ICD-10-CM | POA: Diagnosis not present

## 2022-10-21 DIAGNOSIS — I130 Hypertensive heart and chronic kidney disease with heart failure and stage 1 through stage 4 chronic kidney disease, or unspecified chronic kidney disease: Secondary | ICD-10-CM | POA: Diagnosis not present

## 2022-10-21 DIAGNOSIS — I2510 Atherosclerotic heart disease of native coronary artery without angina pectoris: Secondary | ICD-10-CM | POA: Diagnosis not present

## 2022-10-21 DIAGNOSIS — I214 Non-ST elevation (NSTEMI) myocardial infarction: Secondary | ICD-10-CM | POA: Diagnosis not present

## 2022-10-21 DIAGNOSIS — N1831 Chronic kidney disease, stage 3a: Secondary | ICD-10-CM | POA: Diagnosis not present

## 2022-10-22 DIAGNOSIS — I5021 Acute systolic (congestive) heart failure: Secondary | ICD-10-CM | POA: Diagnosis not present

## 2022-10-22 DIAGNOSIS — I214 Non-ST elevation (NSTEMI) myocardial infarction: Secondary | ICD-10-CM | POA: Diagnosis not present

## 2022-10-22 DIAGNOSIS — N1831 Chronic kidney disease, stage 3a: Secondary | ICD-10-CM | POA: Diagnosis not present

## 2022-10-22 DIAGNOSIS — I2510 Atherosclerotic heart disease of native coronary artery without angina pectoris: Secondary | ICD-10-CM | POA: Diagnosis not present

## 2022-10-22 DIAGNOSIS — I130 Hypertensive heart and chronic kidney disease with heart failure and stage 1 through stage 4 chronic kidney disease, or unspecified chronic kidney disease: Secondary | ICD-10-CM | POA: Diagnosis not present

## 2022-10-22 DIAGNOSIS — E1122 Type 2 diabetes mellitus with diabetic chronic kidney disease: Secondary | ICD-10-CM | POA: Diagnosis not present

## 2022-10-23 ENCOUNTER — Ambulatory Visit (HOSPITAL_COMMUNITY)
Admit: 2022-10-23 | Discharge: 2022-10-23 | Disposition: A | Discharge status: Home / Self Care | Payer: Medicare Other | Attending: Physician Assistant | Admitting: Physician Assistant

## 2022-10-23 ENCOUNTER — Encounter (HOSPITAL_COMMUNITY): Payer: Self-pay

## 2022-10-23 VITALS — BP 108/64 | HR 85 | Wt 152.0 lb

## 2022-10-23 DIAGNOSIS — I255 Ischemic cardiomyopathy: Secondary | ICD-10-CM | POA: Diagnosis not present

## 2022-10-23 DIAGNOSIS — Z79899 Other long term (current) drug therapy: Secondary | ICD-10-CM | POA: Diagnosis not present

## 2022-10-23 DIAGNOSIS — Z66 Do not resuscitate: Secondary | ICD-10-CM | POA: Insufficient documentation

## 2022-10-23 DIAGNOSIS — I130 Hypertensive heart and chronic kidney disease with heart failure and stage 1 through stage 4 chronic kidney disease, or unspecified chronic kidney disease: Secondary | ICD-10-CM | POA: Insufficient documentation

## 2022-10-23 DIAGNOSIS — E1122 Type 2 diabetes mellitus with diabetic chronic kidney disease: Secondary | ICD-10-CM | POA: Diagnosis not present

## 2022-10-23 DIAGNOSIS — I5022 Chronic systolic (congestive) heart failure: Secondary | ICD-10-CM | POA: Insufficient documentation

## 2022-10-23 DIAGNOSIS — Z87440 Personal history of urinary (tract) infections: Secondary | ICD-10-CM | POA: Diagnosis not present

## 2022-10-23 DIAGNOSIS — I252 Old myocardial infarction: Secondary | ICD-10-CM | POA: Insufficient documentation

## 2022-10-23 DIAGNOSIS — Z7984 Long term (current) use of oral hypoglycemic drugs: Secondary | ICD-10-CM | POA: Diagnosis not present

## 2022-10-23 DIAGNOSIS — Z87891 Personal history of nicotine dependence: Secondary | ICD-10-CM | POA: Diagnosis not present

## 2022-10-23 DIAGNOSIS — I214 Non-ST elevation (NSTEMI) myocardial infarction: Secondary | ICD-10-CM | POA: Diagnosis not present

## 2022-10-23 DIAGNOSIS — Z7902 Long term (current) use of antithrombotics/antiplatelets: Secondary | ICD-10-CM | POA: Insufficient documentation

## 2022-10-23 DIAGNOSIS — I5020 Unspecified systolic (congestive) heart failure: Secondary | ICD-10-CM | POA: Diagnosis not present

## 2022-10-23 DIAGNOSIS — N1831 Chronic kidney disease, stage 3a: Secondary | ICD-10-CM | POA: Diagnosis not present

## 2022-10-23 DIAGNOSIS — I2510 Atherosclerotic heart disease of native coronary artery without angina pectoris: Secondary | ICD-10-CM | POA: Insufficient documentation

## 2022-10-23 DIAGNOSIS — J849 Interstitial pulmonary disease, unspecified: Secondary | ICD-10-CM | POA: Diagnosis not present

## 2022-10-23 DIAGNOSIS — Z7982 Long term (current) use of aspirin: Secondary | ICD-10-CM | POA: Insufficient documentation

## 2022-10-23 LAB — COMPREHENSIVE METABOLIC PANEL
ALT: 15 U/L (ref 0–44)
AST: 26 U/L (ref 15–41)
Albumin: 3.2 g/dL — ABNORMAL LOW (ref 3.5–5.0)
Alkaline Phosphatase: 44 U/L (ref 38–126)
Anion gap: 12 (ref 5–15)
BUN: 23 mg/dL (ref 8–23)
CO2: 25 mmol/L (ref 22–32)
Calcium: 9.1 mg/dL (ref 8.9–10.3)
Chloride: 100 mmol/L (ref 98–111)
Creatinine, Ser: 1.5 mg/dL — ABNORMAL HIGH (ref 0.61–1.24)
GFR, Estimated: 46 mL/min — ABNORMAL LOW (ref >60)
Glucose, Bld: 98 mg/dL (ref 70–99)
Potassium: 3.8 mmol/L (ref 3.5–5.1)
Sodium: 137 mmol/L (ref 135–145)
Total Bilirubin: 0.8 mg/dL (ref 0.3–1.2)
Total Protein: 6.1 g/dL — ABNORMAL LOW (ref 6.5–8.1)

## 2022-10-23 LAB — BRAIN NATRIURETIC PEPTIDE: B Natriuretic Peptide: 2600.7 pg/mL — ABNORMAL HIGH (ref 0.0–100.0)

## 2022-10-23 MED ORDER — SPIRONOLACTONE 25 MG PO TABS
12.5000 mg | ORAL_TABLET | Freq: Every day | ORAL | 0 refills | Status: DC
Start: 2022-10-23 — End: 2022-11-11

## 2022-10-23 NOTE — Patient Instructions (Signed)
Stop Midodrine Stop Imdur Take an extra 40 mg of Lasix tomorrow morning. Start Spiro 12.5 mg daily - [facility] Rx [facility] Will ask Cardiology to get labs at your next visit with them.  Please call us at [phone] if any questions or concerns.

## 2022-10-23 NOTE — Progress Notes (Addendum)
HEART & VASCULAR TRANSITION OF CARE CONSULT NOTE     Referring Physician: Dr. Herbie Baltimore Primary Care: Dr. Renne Crigler Primary Cardiologist: Dr. Anne Fu  HPI: Referred to clinic by Dr. Herbie Baltimore with [facility] for heart failure consultation. 82 y.o. male with history of CAD, HFrEF, CKD, DM, HTN, ILD.   Admitted 10/05/22 with NSTEMI. LHC demonstrated critical LM stenosis and severe p LAD and OM stenoses. EF 30%. IABP inserted. CT surgery consulted. CABG considered but patient was concerned that he would not recover from surgery and opted for medical management only. Code status changed to DNR. Seen by Palliative Care and declined outpatient palliative services. He was diuresed. GDMT limited by hypotension. Treated for possible UTI.   He was discharged 05/25 but readmitted 05/28 with a/c CHF. He diuresed with IV lasix. GDMT again limited by hypotension. He was placed on midodrine 2.5 mg TID.   He is here today for hospital follow-up. Home weight has been averaging 148-149 lb. Has noticed more leg edema the last 1-2 days after removing his very tight compression stockings (now wearing a different pair). His wife, who is a retired Engineer, civil (consulting), reports he did not have any edema at discharge. Dyspnea has significantly improved. No orthopnea or PND.   Ambulating with a walker. Has been working with home PT. Blood pressures averaging consistently above 100 systolic.  Retired Psychologist, counselling. Lives at home with his wife.   Past Medical History:  Diagnosis Date   Arthritis    CAD (coronary artery disease) 10/05/2022   Cath 09/2022: 3v CAD, critical LM stenosis - Pt opted for med Rx only>>DNR   Chronic kidney disease, stage 3a (HCC)    Chronic urinary tract infection    Coronary artery calcification seen on CT scan    Diabetes mellitus without complication (HCC)    Gout    Heart murmur    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   S/p non-STEMI 09/2022>> three-vessel and  critical left main stenosis; patient opted for medical management // TTE 09/2022: EF 20-25, GR 2 DD, RVSP 59.1, trivial MR, mild-moderate TR, mild aortic stenosis, mean gradient 10, V-max 206 cm/s, RAP 15   History of non-ST elevation myocardial infarction (NSTEMI) 10/05/2022   Hypertension    ILD (interstitial lung disease) (HCC)    by CT 01/2021   Ischemic cardiomyopathy 10/10/2022   Mild aortic stenosis    Mild dilation of ascending aorta (HCC)    Osteoporosis     Current Outpatient Medications  Medication Sig Dispense Refill   aspirin EC 81 MG tablet Take 1 tablet (81 mg total) by mouth daily. Swallow whole. 90 tablet 3   clopidogrel (PLAVIX) 75 MG tablet Take 1 tablet (75 mg total) by mouth daily. 90 tablet 3   CRANBERRY PO Take 1 capsule by mouth daily.     furosemide (LASIX) 40 MG tablet Take 1 tablet (40 mg total) by mouth as needed (Take as directed by your sliding scale instructions noted on discharge summary.). 90 tablet 3   furosemide (LASIX) 80 MG tablet Take 1 tablet (80 mg total) by mouth daily at 12 noon. 30 tablet 6   ipratropium (ATROVENT) 0.06 % nasal spray Place 2 sprays into both nostrils 3 (three) times daily as needed for rhinitis.     metFORMIN (GLUCOPHAGE-XR) 750 MG 24 hr tablet Take 750 mg by mouth daily after breakfast.     metoprolol succinate (TOPROL-XL) 25 MG 24 hr tablet Take 0.5 tablets (12.5 mg  total) by mouth daily. 30 tablet 3   nitroGLYCERIN (NITROSTAT) 0.4 MG SL tablet Place 1 tablet (0.4 mg total) under the tongue every 5 (five) minutes x 3 doses as needed for chest pain. 25 tablet 3   ranolazine (RANEXA) 500 MG 12 hr tablet Take 1 tablet (500 mg total) by mouth 2 (two) times daily. 60 tablet 3   rosuvastatin (CRESTOR) 20 MG tablet Take 1 tablet (20 mg total) by mouth daily. 90 tablet 3   spironolactone (ALDACTONE) 25 MG tablet Take 0.5 tablets (12.5 mg total) by mouth daily. 15 tablet 0   No current facility-administered medications for this encounter.     Allergies  Allergen Reactions   Erythromycin Other (See Comments)    Severe Abdominal Pain   Penicillins Hives      Social History   Socioeconomic History   Marital status: Married    Spouse name: Alice   Number of children: 2   Years of education: Not on file   Highest education level: Not on file  Occupational History   Occupation: Retired  Tobacco Use   Smoking status: Former    Packs/day: 1.00    Years: 31.00    Additional pack years: 0.00    Total pack years: 31.00    Types: Cigarettes    Quit date: 1988    Years since quitting: 36.4   Smokeless tobacco: Never  Vaping Use   Vaping Use: Never used  Substance and Sexual Activity   Alcohol use: Yes    Alcohol/week: 0.0 standard drinks of alcohol    Comment: social   Drug use: No   Sexual activity: Not Currently  Other Topics Concern   Not on file  Social History Narrative   Not on file   Social Determinants of Health   Financial Resource Strain: Low Risk  (10/07/2022)   Overall Financial Resource Strain (CARDIA)    Difficulty of Paying Living Expenses: Not very hard  Food Insecurity: No Food Insecurity (10/13/2022)   Hunger Vital Sign    Worried About Running Out of Food in the Last Year: Never true    Ran Out of Food in the Last Year: Never true  Transportation Needs: No Transportation Needs (10/13/2022)   PRAPARE - Administrator, Civil Service (Medical): No    Lack of Transportation (Non-Medical): No  Physical Activity: Not on file  Stress: Not on file  Social Connections: Not on file  Intimate Partner Violence: Not At Risk (10/13/2022)   Humiliation, Afraid, Rape, and Kick questionnaire    Fear of Current or Ex-Partner: No    Emotionally Abused: No    Physically Abused: No    Sexually Abused: No      Family History  Problem Relation Age of Onset   Heart disease Sister     Vitals:   10/23/22 1453  BP: 108/64  Pulse: 85  SpO2: 99%  Weight: 68.9 kg (152 lb)    PHYSICAL  EXAM: General: Chronically ill appearing elderly male HEENT: HOH Neck: supple. JVP 8-9. Carotids 2+ bilat; no bruits.  Cor: PMI nondisplaced. Regular rate & rhythm. No rubs, gallops or murmurs. Lungs: clear Abdomen: soft, nontender, nondistended.  Extremities: no cyanosis, clubbing, rash, 1-2+ edema, + TED hose Neuro: alert & oriented x 3. Affect pleasant.  ECG: Sinus rhythm with 1st degree AVB, LVH with secondary repolarization abnormality   ASSESSMENT & PLAN: HFrEF/ICM -R/LHC 10/05/22: 95% LM, severe p and m LAD stenoses, severe OM stenosis, LVEF 30%,  Fick CI 2.7. IABP placed. Patient opted against surgery and wished to be DNR. -Echo 05/24: EF 20-25%, apical swirling/sludge without evidence of formed thrombus, RV okay, RVSP 60 mmHg, mild to moderate TR NYHA IIIa GDMT  Diuretic-Takes lasix 80 mg daily. Appears mildly volume up today despite lack of weight gain. Take PRN 40 mg lasix tomorrow am. BB-Toprol XL 12.5 mg daily Ace/ARB/ARNI-Not yet MRA-Add spiro 12.5 mg daily SGLT2i-No, recent UTI. Can consider next. -SBP has consistently been above 100 since discharge. Stop midodrine. Stop Imdur to allow more room to titrate GDMT -Labs today -Will see if BMET can be repeated at f/u with Cardiology on 06/17  CAD -NSTEMI 05/24. Severe LM, LAD and OM disease on LHC as above. -Declined CABG.  -On aspirin and plavix.  -Continue statin. -On Ranexa 500 BID + Toprol XL.  No angina. Stopped Imdur as above.  CKD II/ IIIa -Scr had been around 1, up to 1.5 recently in setting of volume overload and diuresis -Check labs today  Interstitial lung disease   Referred to HFSW (PCP, Medications, Transportation, ETOH Abuse, Drug Abuse, Insurance, Financial ): No Refer to Pharmacy: No Refer to Home Health: No Refer to Advanced Heart Failure Clinic: no  Refer to General Cardiology: No  Follow up  PRN. Follow-up with Cardiology as scheduled

## 2022-10-26 DIAGNOSIS — I214 Non-ST elevation (NSTEMI) myocardial infarction: Secondary | ICD-10-CM | POA: Diagnosis not present

## 2022-10-26 DIAGNOSIS — I130 Hypertensive heart and chronic kidney disease with heart failure and stage 1 through stage 4 chronic kidney disease, or unspecified chronic kidney disease: Secondary | ICD-10-CM | POA: Diagnosis not present

## 2022-10-26 DIAGNOSIS — E1122 Type 2 diabetes mellitus with diabetic chronic kidney disease: Secondary | ICD-10-CM | POA: Diagnosis not present

## 2022-10-26 DIAGNOSIS — I5021 Acute systolic (congestive) heart failure: Secondary | ICD-10-CM | POA: Diagnosis not present

## 2022-10-27 ENCOUNTER — Ambulatory Visit: Payer: Medicare Other | Admitting: Physician Assistant

## 2022-10-28 ENCOUNTER — Ambulatory Visit: Payer: Self-pay

## 2022-10-28 NOTE — Patient Instructions (Signed)
Visit Information  Thank you for taking time to visit with me today. Please don't hesitate to contact me if I can be of assistance to you.   Following are the goals we discussed today:   Goals Addressed             This Visit's Progress    Heart Failure Management       Patient Goals/Self Care Activities: -Patient/Caregiver will self-administer medications as prescribed as evidenced by self-report/primary caregiver report  -Patient/Caregiver will attend all scheduled provider appointments as evidenced by clinician review of documented attendance to scheduled appointments and patient/caregiver report -Patient/Caregiver will call provider office for new concerns or questions as evidenced by review of documented incoming telephone call notes and patient report  -trim toenails straight across -wear comfortable, well-fitting shoes -Weigh daily and record (notify MD with 3 lb weight gain over night or 5 lb in a week) -Follow CHF Action Plan -Adhere to low sodium diet   Spoke with spouse [name].  She states patient doing pretty good.  Walking to the mailbox with walker, bathing himself, and weighing daily.  Today's weight 145.9 lbs.  Has sliding scale lasix.  Wife knowledgeable able when to use lasix.  Discussed heart failure management.  Blood pressures have been over 100 per spouse with last check 101/65.  Patient has ingrown toenail.  Podiatry appointment set.    Cardiologist appointment 11/02/22 PCP appointment 11/04/22.  Podiatry appointment 11/10/22        Our next appointment is in-person at @PCP @'s office on 11/04/22 at 1100 am appointment time  Please call the care guide team at [phone] if you need to cancel or reschedule your appointment.   If you are experiencing a Mental Health or Behavioral Health Crisis or need someone to talk to, please call the Suicide and Crisis Lifeline: 988   Patient verbalizes understanding of instructions and care plan provided today and agrees to  view in MyChart. Active MyChart status and patient understanding of how to access instructions and care plan via MyChart confirmed with patient.     The patient has been provided with contact information for the care management team and has been advised to call with any health related questions or concerns.   Bary Leriche, RN, MSN [facility] Care Management Care Management Coordinator Direct Line [phone]

## 2022-10-28 NOTE — Patient Outreach (Signed)
  Care Coordination   Initial Visit Note   10/28/2022 Name: [name] Steele MRN: [id number] DOB: [birth year]  [name] Steele is a 82 y.o. year old male who sees [name], MD for primary care. I  spoke with spouse [name] by phone today.    What matters to the patients health and wellness today?  Heart failure management    Goals Addressed             This Visit's Progress    Heart Failure Management       Patient Goals/Self Care Activities: -Patient/Caregiver will self-administer medications as prescribed as evidenced by self-report/primary caregiver report  -Patient/Caregiver will attend all scheduled provider appointments as evidenced by clinician review of documented attendance to scheduled appointments and patient/caregiver report -Patient/Caregiver will call provider office for new concerns or questions as evidenced by review of documented incoming telephone call notes and patient report  -trim toenails straight across -wear comfortable, well-fitting shoes -Weigh daily and record (notify MD with 3 lb weight gain over night or 5 lb in a week) -Follow CHF Action Plan -Adhere to low sodium diet   Spoke with spouse [name].  She states patient doing pretty good.  Walking to the mailbox with walker, bathing himself, and weighing daily.  Today's weight 145.9 lbs.  Has sliding scale lasix.  Wife knowledgeable able when to use lasix.  Discussed heart failure management.  Blood pressures have been over 100 per spouse with last check 101/65.  Patient has ingrown toenail.  Podiatry appointment set.    Cardiologist appointment 11/02/22 PCP appointment 11/04/22.  Podiatry appointment 11/10/22        SDOH assessments and interventions completed:  Yes  SDOH Interventions Today    Flowsheet Row Most Recent Value  SDOH Interventions   Housing Interventions Intervention Not Indicated  Transportation Interventions Intervention Not Indicated        Care Coordination Interventions:  Yes,  provided   Follow up plan:  RN CM will plan to see patient and spouse in PCP office next week 6/19.     Encounter Outcome:  Pt. Visit Completed    Bary Leriche, RN, MSN [facility] Care Management Care Management Coordinator Direct Line [phone]

## 2022-10-29 ENCOUNTER — Encounter: Payer: Self-pay | Admitting: Physician Assistant

## 2022-10-29 DIAGNOSIS — I214 Non-ST elevation (NSTEMI) myocardial infarction: Secondary | ICD-10-CM | POA: Diagnosis not present

## 2022-10-29 DIAGNOSIS — E1122 Type 2 diabetes mellitus with diabetic chronic kidney disease: Secondary | ICD-10-CM | POA: Diagnosis not present

## 2022-10-29 DIAGNOSIS — I130 Hypertensive heart and chronic kidney disease with heart failure and stage 1 through stage 4 chronic kidney disease, or unspecified chronic kidney disease: Secondary | ICD-10-CM | POA: Diagnosis not present

## 2022-10-29 DIAGNOSIS — I2510 Atherosclerotic heart disease of native coronary artery without angina pectoris: Secondary | ICD-10-CM | POA: Diagnosis not present

## 2022-10-29 DIAGNOSIS — I5021 Acute systolic (congestive) heart failure: Secondary | ICD-10-CM | POA: Diagnosis not present

## 2022-10-29 DIAGNOSIS — N1831 Chronic kidney disease, stage 3a: Secondary | ICD-10-CM | POA: Diagnosis not present

## 2022-10-29 NOTE — Progress Notes (Signed)
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  He established care with Dr. Anne Fu in 03/2021 after calcium score ordered by PCP showed CAC 6,230 (97%ile). Study also picked up evidence of chronic ILD. Patient declined referral to pulmonology. Nuclear stress test was positive for small territory of ischemia but felt to be low risk so Dr. Anne Fu managed this medically. Statin was added. He also has a prior history of significant lower extremity edema requiring medication adjustment including transition from amlodipine to beta blocker. He was admitted 10/05/22 with NSTEMI. LHC demonstrated critical LM stenosis and severe p LAD and OM stenoses, EF 30%, required IABP. Formal echo 10/05/22 showed EF 20-25%, G2DD, presence of apical swirling/sludge without formed thrombus, moderately elevated PASP, trivial MR, mild AS, mild AI, mild-moderate TR, normal aorta size. CT surgery consulted. CABG considered but patient was concerned that he would not recover from surgery and opted for medical management only. Code status changed to DNR. He was seen by Palliative Care and declined outpatient palliative services. He was diuresed. GDMT limited by hypotension. He was also treated for possible UTI. He was discharged 5/25 but readmitted 5/28 with CHF. He diuresed with IV lasix. GDMT again limited by hypotension. He was placed on midodrine 2.5 mg TID. At last OV with HF TOC team, Imdur was stopped to allow more room to titrate  GDMT, midodrine was stopped, spironolactone added, advised to take extra dose of 40mg  Lasix for volume overload (on 80mg  daily).  He is seen for follow-up today with his wife. In general they feel he is doing well. He is back to walking for activity and is working with PT twice a week. He had once instance a few weeks ago of SBP 85 but no hypotension since then. She does most of the talking during the interview.   Labwork independently reviewed: 10/23/22 BNP 2600, K 3.8, Cr 1.5, albumin 3.2, AST/ALT OK 09/2022 Mg 1.8, Hgb 11.3, plt ok, LPa 34.8 07/2022 A1c 5.3 09/2021 TSH wnl  Past History   Past Medical History:  Diagnosis Date   Arthritis    CAD (coronary artery disease) 10/05/2022   Cath 09/2022: 3v CAD, critical LM stenosis - Pt opted for med Rx only>>DNR   Chronic kidney disease, stage 3a (HCC)    Chronic urinary tract infection    Diabetes mellitus without complication (HCC)    Gout    Heart murmur    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   S/p non-STEMI 09/2022>> three-vessel and critical left main stenosis; patient opted for medical management // TTE 09/2022: EF 20-25, GR 2 DD, RVSP 59.1, trivial MR, mild-moderate TR, mild aortic stenosis, mean gradient 10, V-max 206 cm/s, RAP 15   History of non-ST elevation myocardial infarction (NSTEMI) 10/05/2022   Hypertension    ILD (interstitial lung disease) (HCC)    by CT 01/2021   Ischemic cardiomyopathy 10/10/2022   Mild aortic stenosis    Mild dilation of ascending aorta (HCC)  Osteoporosis     Past Surgical History:  Procedure Laterality Date   AORTIC ARCH ANGIOGRAPHY N/A 10/05/2022   Procedure: AORTIC ARCH ANGIOGRAPHY;  Surgeon: Tonny Bollman, MD;  Location: [facility] INVASIVE CV LAB;  Service: Cardiovascular;  Laterality: N/A;   CHOLECYSTECTOMY N/A 07/04/2018   Procedure: LAPAROSCOPIC CHOLECYSTECTOMY WITH INTRAOPERATIVE CHOLANGIOGRAM;  Surgeon: Abigail Miyamoto, MD;  Location: [facility] ORS;  Service:  General;  Laterality: N/A;   ERCP N/A 07/05/2018   Procedure: ENDOSCOPIC RETROGRADE CHOLANGIOPANCREATOGRAPHY (ERCP);  Surgeon: Vida Rigger, MD;  Location: [facility] ENDOSCOPY;  Service: Endoscopy;  Laterality: N/A;   IABP INSERTION N/A 10/05/2022   Procedure: IABP Insertion;  Surgeon: Tonny Bollman, MD;  Location: [facility] INVASIVE CV LAB;  Service: Cardiovascular;  Laterality: N/A;   INGUINAL HERNIA REPAIR Right 07/31/2022   Procedure: OPEN RIGHT INGUINAL HERNIA REPAIR WITH MESH;  Surgeon: Berna Bue, MD;  Location: [facility];  Service: General;  Laterality: Right;   JOINT REPLACEMENT Bilateral    REMOVAL OF STONES  07/05/2018   Procedure: REMOVAL OF STONES;  Surgeon: Vida Rigger, MD;  Location: [facility] ENDOSCOPY;  Service: Endoscopy;;   RIGHT/LEFT HEART CATH AND CORONARY ANGIOGRAPHY N/A 10/05/2022   Procedure: RIGHT/LEFT HEART CATH AND CORONARY ANGIOGRAPHY;  Surgeon: Tonny Bollman, MD;  Location: [facility] INVASIVE CV LAB;  Service: Cardiovascular;  Laterality: N/A;   SPHINCTEROTOMY  07/05/2018   Procedure: SPHINCTEROTOMY;  Surgeon: Vida Rigger, MD;  Location: [facility] ENDOSCOPY;  Service: Endoscopy;;    Current Medications: Current Meds  Medication Sig   aspirin EC 81 MG tablet Take 1 tablet (81 mg total) by mouth daily. Swallow whole.   clopidogrel (PLAVIX) 75 MG tablet Take 1 tablet (75 mg total) by mouth daily.   CRANBERRY PO Take 1 capsule by mouth daily.   furosemide (LASIX) 40 MG tablet Take 1 tablet (40 mg total) by mouth as needed (Take as directed by your sliding scale instructions noted on discharge summary.).   furosemide (LASIX) 80 MG tablet Take 1 tablet (80 mg total) by mouth daily at 12 noon.   ipratropium (ATROVENT) 0.06 % nasal spray Place 2 sprays into both nostrils 3 (three) times daily as needed for rhinitis.   metFORMIN (GLUCOPHAGE-XR) 750 MG 24 hr tablet Take 750 mg by mouth daily after breakfast.   metoprolol succinate (TOPROL-XL) 25 MG 24 hr tablet Take 0.5 tablets (12.5 mg total) by mouth  daily.   nitroGLYCERIN (NITROSTAT) 0.4 MG SL tablet Place 1 tablet (0.4 mg total) under the tongue every 5 (five) minutes x 3 doses as needed for chest pain.   ranolazine (RANEXA) 500 MG 12 hr tablet Take 1 tablet (500 mg total) by mouth 2 (two) times daily.   rosuvastatin (CRESTOR) 20 MG tablet Take 1 tablet (20 mg total) by mouth daily.   spironolactone (ALDACTONE) 25 MG tablet Take 0.5 tablets (12.5 mg total) by mouth daily.      Allergies:   Erythromycin and Penicillins   Social History   Socioeconomic History   Marital status: Married    Spouse name: Alice   Number of children: 2   Years of education: Not on file   Highest education level: Not on file  Occupational History   Occupation: Retired  Tobacco Use   Smoking status: Former    Packs/day: 1.00    Years: 31.00    Additional pack years: 0.00    Total pack years: 31.00    Types: Cigarettes    Quit date: 1988    Years since quitting: 36.4  Smokeless tobacco: Never  Vaping Use   Vaping Use: Never used  Substance and Sexual Activity   Alcohol use: Yes    Alcohol/week: 0.0 standard drinks of alcohol    Comment: social   Drug use: No   Sexual activity: Not Currently  Other Topics Concern   Not on file  Social History Narrative   Not on file   Social Determinants of Health   Financial Resource Strain: Low Risk  (10/07/2022)   Overall Financial Resource Strain (CARDIA)    Difficulty of Paying Living Expenses: Not very hard  Food Insecurity: No Food Insecurity (10/13/2022)   Hunger Vital Sign    Worried About Running Out of Food in the Last Year: Never true    Ran Out of Food in the Last Year: Never true  Transportation Needs: No Transportation Needs (10/28/2022)   PRAPARE - Administrator, Civil Service (Medical): No    Lack of Transportation (Non-Medical): No  Physical Activity: Not on file  Stress: Not on file  Social Connections: Not on file     Family History:  The patient's family history  includes Heart disease in his sister.  ROS:   Please see the history of present illness.  All other systems are reviewed and otherwise negative.    EKG(s)/Additional Testing   EKG:  EKG is ordered today, personally reviewed, demonstrating NSR 71bpm, first degree AVB, one PVC, diffuse nonspecific STTW changes with TWI I, avL, V2-V6; EKG repeated given STE seen on previous EKG no longer as apparent.QTC  CV Studies: Cardiac studies reviewed are outlined and summarized above. Otherwise please see EMR for full report.  Recent Labs: 10/13/2022: Hemoglobin 11.7; Magnesium 1.8; Platelets 306 10/23/2022: ALT 15; B Natriuretic Peptide 2,600.7; BUN 23; Creatinine, Ser 1.50; Potassium 3.8; Sodium 137  Recent Lipid Panel    Component Value Date/Time   CHOL 93 (L) 07/15/2021 1029   TRIG 70 07/15/2021 1029   HDL 46 07/15/2021 1029   CHOLHDL 2.0 07/15/2021 1029   LDLCALC 32 07/15/2021 1029    PHYSICAL EXAM:    VS:  BP (!) 114/58   Pulse 68   Ht 5\' 9"  (1.753 m)   Wt 147 lb (66.7 kg)   SpO2 97%   BMI 21.71 kg/m   BMI: Body mass index is 21.71 kg/m.  GEN: Well nourished, well developed male in no acute distress HEENT: normocephalic, atraumatic Neck: no JVD, carotid bruits, or masses Cardiac: RRR; no murmurs, rubs, or gallops, 1+ soft BLE edema  Respiratory:  clear to auscultation bilaterally, normal work of breathing GI: soft, nontender, nondistended, + BS MS: no deformity or atrophy Skin: warm and dry, no rash. Right groin cath site without hematoma, ecchymosis, or bruit. Neuro:  Alert and Oriented x 3, Strength and sensation are intact, follows commands. HOH Psych: euthymic mood, full affect  Wt Readings from Last 3 Encounters:  11/02/22 147 lb (66.7 kg)  10/23/22 152 lb (68.9 kg)  10/16/22 156 lb 8.4 oz (71 kg)     ASSESSMENT & PLAN:   1. CAD - doing reasonably well on medical therapy with ASA, Plavix, metoprolol, rosuvastatin, Ranexa. QTC stable today. Wife reports she had  some issues with cutting metoprolol before they got a pill cutter so had to sacrifice a few tablets that were not adequate to take. She requests refill today locally [facility] [address] then prefers future rx refills be sent to [facility]. We'll plan to check labs today to ensure med rx  stable with the intention of being able to send in refills tomorrow when labs result. Consider checking lipid panel at f/u in August.  2. Chronic HFrEF with moderate pulm HTN with GDMT limited by hypotension - appears euvolemic on exam today - edema looks much better than previous chronic edema and weight is stable. Continue present regimen. Avoid SGLT2i due to frequent UTIs and Entresto/ARB due to issues with hypotension. Continue Lasix and spironolactone. Check CMET, TSH, CBC today. Holding off referral for ICD given non-alignment with GOC status of DNR. Consider repeat echo to re-eval LVEF after next follow-up visit.  3. Mild AS, AI, mild-moderate TR - follow clinically for now.  4. CKD 3a - recheck labs today. Will refer to nephrology to establish care.     Disposition: Had appt later this month with Dr. Anne Fu; wife does not feel necessary to keep given OV so close today. I agree with this. We'll push out follow-up to 2-3 months.   Medication Adjustments/Labs and Tests Ordered: Current medicines are reviewed at length with the patient today.  Concerns regarding medicines are outlined above. Medication changes, Labs and Tests ordered today are summarized above and listed in the Patient Instructions accessible in Encounters.   Signed, Laurann Montana, PA-C  11/02/2022 11:14 AM    [facility] Phone: [phone]; Fax: [phone]

## 2022-11-02 ENCOUNTER — Encounter: Payer: Self-pay | Admitting: Physician Assistant

## 2022-11-02 ENCOUNTER — Ambulatory Visit: Payer: Medicare Other | Attending: Physician Assistant | Admitting: Physician Assistant

## 2022-11-02 VITALS — BP 114/58 | HR 68 | Ht 69.0 in | Wt 147.0 lb

## 2022-11-02 DIAGNOSIS — I351 Nonrheumatic aortic (valve) insufficiency: Secondary | ICD-10-CM | POA: Diagnosis not present

## 2022-11-02 DIAGNOSIS — N1831 Chronic kidney disease, stage 3a: Secondary | ICD-10-CM | POA: Insufficient documentation

## 2022-11-02 DIAGNOSIS — I2510 Atherosclerotic heart disease of native coronary artery without angina pectoris: Secondary | ICD-10-CM | POA: Diagnosis not present

## 2022-11-02 DIAGNOSIS — I071 Rheumatic tricuspid insufficiency: Secondary | ICD-10-CM | POA: Diagnosis not present

## 2022-11-02 DIAGNOSIS — I5022 Chronic systolic (congestive) heart failure: Secondary | ICD-10-CM | POA: Insufficient documentation

## 2022-11-02 DIAGNOSIS — I350 Nonrheumatic aortic (valve) stenosis: Secondary | ICD-10-CM | POA: Diagnosis not present

## 2022-11-02 DIAGNOSIS — I272 Pulmonary hypertension, unspecified: Secondary | ICD-10-CM | POA: Insufficient documentation

## 2022-11-02 MED ORDER — METOPROLOL SUCCINATE ER 25 MG PO TB24: 12.5000 mg | ORAL_TABLET | Freq: Every day | ORAL | 3 refills | Status: DC

## 2022-11-02 NOTE — Patient Instructions (Signed)
Medication Instructions:  Your physician recommends that you continue on your current medications as directed. Please refer to the Current Medication list given to you today.  *If you need a refill on your cardiac medications before your next appointment, please call your pharmacy*   Lab Work: CMET, CBC, TSh If you have labs (blood work) drawn today and your tests are completely normal, you will receive your results only by: MyChart Message (if you have MyChart) OR A paper copy in the mail If you have any lab test that is abnormal or we need to change your treatment, we will call you to review the results.   Follow-Up: At [facility], you and your health needs are our priority.  As part of our continuing mission to provide you with exceptional heart care, we have created designated Provider Care Teams.  These Care Teams include your primary Cardiologist (physician) and Advanced Practice Providers (APPs -  Physician Assistants and Nurse Practitioners) who all work together to provide you with the care you need, when you need it.  Your next appointment:   2- 3 month(s) Currently scheduled for 6/28- this can be canceled  Provider:   Donato Schultz, MD   or DDunn

## 2022-11-03 LAB — COMPREHENSIVE METABOLIC PANEL
ALT: 12 IU/L (ref 0–44)
AST: 25 IU/L (ref 0–40)
Albumin: 4.2 g/dL (ref 3.7–4.7)
Alkaline Phosphatase: 62 IU/L (ref 44–121)
BUN/Creatinine Ratio: 18 (ref 10–24)
BUN: 28 mg/dL — ABNORMAL HIGH (ref 8–27)
Bilirubin Total: 0.5 mg/dL (ref 0.0–1.2)
CO2: 26 mmol/L (ref 20–29)
Calcium: 9.8 mg/dL (ref 8.6–10.2)
Chloride: 96 mmol/L (ref 96–106)
Creatinine, Ser: 1.54 mg/dL — ABNORMAL HIGH (ref 0.76–1.27)
Globulin, Total: 2.4 g/dL (ref 1.5–4.5)
Glucose: 90 mg/dL (ref 70–99)
Potassium: 4.4 mmol/L (ref 3.5–5.2)
Sodium: 138 mmol/L (ref 134–144)
Total Protein: 6.6 g/dL (ref 6.0–8.5)
eGFR: 45 mL/min/{1.73_m2} — ABNORMAL LOW (ref >59)

## 2022-11-03 LAB — CBC
Hematocrit: 40.4 % (ref 37.5–51.0)
Hemoglobin: 13.1 g/dL (ref 13.0–17.7)
MCH: 31.3 pg (ref 26.6–33.0)
MCHC: 32.4 g/dL (ref 31.5–35.7)
MCV: 97 fL (ref 79–97)
Platelets: 219 10*3/uL (ref 150–450)
RBC: 4.18 x10E6/uL (ref 4.14–5.80)
RDW: 12.7 % (ref 11.6–15.4)
WBC: 5 10*3/uL (ref 3.4–10.8)

## 2022-11-03 LAB — TSH: TSH: 3 u[IU]/mL (ref 0.450–4.500)

## 2022-11-04 ENCOUNTER — Ambulatory Visit: Payer: Self-pay

## 2022-11-04 DIAGNOSIS — I5022 Chronic systolic (congestive) heart failure: Secondary | ICD-10-CM | POA: Diagnosis not present

## 2022-11-04 DIAGNOSIS — B351 Tinea unguium: Secondary | ICD-10-CM | POA: Diagnosis not present

## 2022-11-04 DIAGNOSIS — Z09 Encounter for follow-up examination after completed treatment for conditions other than malignant neoplasm: Secondary | ICD-10-CM | POA: Diagnosis not present

## 2022-11-04 DIAGNOSIS — M79674 Pain in right toe(s): Secondary | ICD-10-CM | POA: Diagnosis not present

## 2022-11-04 NOTE — Patient Outreach (Signed)
  Care Coordination   In Person Provider Office Visit Note   11/04/2022 Name: Kyrese Rettke MRN: [id number] DOB: 12/08/1940  Dishawn Radilla is a 82 y.o. year old male who sees [name], MD for primary care. I engaged with [name] in the providers office today.  What matters to the patients health and wellness today?  Getting stronger    Goals Addressed             This Visit's Progress    Heart Failure Management       Patient Goals/Self Care Activities: -Patient/Caregiver will self-administer medications as prescribed as evidenced by self-report/primary caregiver report  -Patient/Caregiver will attend all scheduled provider appointments as evidenced by clinician review of documented attendance to scheduled appointments and patient/caregiver report -Patient/Caregiver will call provider office for new concerns or questions as evidenced by review of documented incoming telephone call notes and patient report  -trim toenails straight across -wear comfortable, well-fitting shoes -Weigh daily and record (notify MD with 3 lb weight gain over night or 5 lb in a week) -Follow CHF Action Plan -Adhere to low sodium diet   Met with patient and spouse at provider office today. Patient doing well.  Walking and participating with therapy. Patient weight at home 140.6 lbs today. Has sliding scale lasix.  Wife knowledgeable able when to use lasix.  Reiterated heart failure management.             SDOH assessments and interventions completed:  Yes     Care Coordination Interventions:  Yes, provided   Follow up plan: Follow up call scheduled for July    Encounter Outcome:  Pt. Visit Completed   Bary Leriche, RN, MSN [facility] Care Management Care Management Coordinator Direct Line [phone]

## 2022-11-04 NOTE — Patient Instructions (Signed)
Visit Information  Thank you for taking time to visit with me today. Please don't hesitate to contact me if I can be of assistance to you.   Following are the goals we discussed today:   Goals Addressed             This Visit's Progress    Heart Failure Management       Patient Goals/Self Care Activities: -Patient/Caregiver will self-administer medications as prescribed as evidenced by self-report/primary caregiver report  -Patient/Caregiver will attend all scheduled provider appointments as evidenced by clinician review of documented attendance to scheduled appointments and patient/caregiver report -Patient/Caregiver will call provider office for new concerns or questions as evidenced by review of documented incoming telephone call notes and patient report  -trim toenails straight across -wear comfortable, well-fitting shoes -Weigh daily and record (notify MD with 3 lb weight gain over night or 5 lb in a week) -Follow CHF Action Plan -Adhere to low sodium diet   Met with patient and spouse at provider office today. Patient doing well.  Walking and participating with therapy. Patient weight at home 140.6 lbs today. Has sliding scale lasix.  Wife knowledgeable able when to use lasix.  Reiterated heart failure management.             Our next appointment is by telephone on 11/16/22 at 1200 pm  Please call the care guide team at [phone] if you need to cancel or reschedule your appointment.   If you are experiencing a Mental Health or Behavioral Health Crisis or need someone to talk to, please call the Suicide and Crisis Lifeline: 988   The patient verbalized understanding of instructions, educational materials, and care plan provided today and DECLINED offer to receive copy of patient instructions, educational materials, and care plan.   The patient has been provided with contact information for the care management team and has been advised to call with any health related  questions or concerns.   Bary Leriche, RN, MSN [facility] Care Management Care Management Coordinator Direct Line [phone]

## 2022-11-05 DIAGNOSIS — I5021 Acute systolic (congestive) heart failure: Secondary | ICD-10-CM | POA: Diagnosis not present

## 2022-11-05 DIAGNOSIS — I130 Hypertensive heart and chronic kidney disease with heart failure and stage 1 through stage 4 chronic kidney disease, or unspecified chronic kidney disease: Secondary | ICD-10-CM | POA: Diagnosis not present

## 2022-11-05 DIAGNOSIS — E1122 Type 2 diabetes mellitus with diabetic chronic kidney disease: Secondary | ICD-10-CM | POA: Diagnosis not present

## 2022-11-05 DIAGNOSIS — N1831 Chronic kidney disease, stage 3a: Secondary | ICD-10-CM | POA: Diagnosis not present

## 2022-11-05 DIAGNOSIS — I214 Non-ST elevation (NSTEMI) myocardial infarction: Secondary | ICD-10-CM | POA: Diagnosis not present

## 2022-11-05 DIAGNOSIS — I2510 Atherosclerotic heart disease of native coronary artery without angina pectoris: Secondary | ICD-10-CM | POA: Diagnosis not present

## 2022-11-06 DIAGNOSIS — I130 Hypertensive heart and chronic kidney disease with heart failure and stage 1 through stage 4 chronic kidney disease, or unspecified chronic kidney disease: Secondary | ICD-10-CM | POA: Diagnosis not present

## 2022-11-06 DIAGNOSIS — I2510 Atherosclerotic heart disease of native coronary artery without angina pectoris: Secondary | ICD-10-CM | POA: Diagnosis not present

## 2022-11-06 DIAGNOSIS — E1122 Type 2 diabetes mellitus with diabetic chronic kidney disease: Secondary | ICD-10-CM | POA: Diagnosis not present

## 2022-11-06 DIAGNOSIS — I5021 Acute systolic (congestive) heart failure: Secondary | ICD-10-CM | POA: Diagnosis not present

## 2022-11-06 DIAGNOSIS — I214 Non-ST elevation (NSTEMI) myocardial infarction: Secondary | ICD-10-CM | POA: Diagnosis not present

## 2022-11-06 DIAGNOSIS — N1831 Chronic kidney disease, stage 3a: Secondary | ICD-10-CM | POA: Diagnosis not present

## 2022-11-09 ENCOUNTER — Other Ambulatory Visit: Payer: Self-pay

## 2022-11-09 MED ORDER — CLOPIDOGREL BISULFATE 75 MG PO TABS: 75.0000 mg | ORAL_TABLET | Freq: Every day | ORAL | 3 refills | Status: DC

## 2022-11-09 MED ORDER — RANOLAZINE ER 500 MG PO TB12: 500.0000 mg | ORAL_TABLET | Freq: Two times a day (BID) | ORAL | 3 refills | Status: DC

## 2022-11-10 ENCOUNTER — Ambulatory Visit (INDEPENDENT_AMBULATORY_CARE_PROVIDER_SITE_OTHER): Payer: Medicare Other | Admitting: Podiatry

## 2022-11-10 DIAGNOSIS — B351 Tinea unguium: Secondary | ICD-10-CM

## 2022-11-10 DIAGNOSIS — M2042 Other hammer toe(s) (acquired), left foot: Secondary | ICD-10-CM

## 2022-11-10 DIAGNOSIS — L84 Corns and callosities: Secondary | ICD-10-CM | POA: Diagnosis not present

## 2022-11-10 DIAGNOSIS — I739 Peripheral vascular disease, unspecified: Secondary | ICD-10-CM | POA: Diagnosis not present

## 2022-11-10 DIAGNOSIS — M2041 Other hammer toe(s) (acquired), right foot: Secondary | ICD-10-CM | POA: Diagnosis not present

## 2022-11-10 DIAGNOSIS — E119 Type 2 diabetes mellitus without complications: Secondary | ICD-10-CM

## 2022-11-10 NOTE — Progress Notes (Unsigned)
Subjective:  Patient ID: [name], male    DOB: [birth year],  MRN: [id number]  Thayer Embleton presents to clinic today for:  Chief Complaint  Patient presents with   Ingrown Toenail    Pt is here for [facility] also looks like he might have an ingrown nail on the left great toe. He is also concerned about the discoloration on his great toe and 2nd toe on the left foot   . Patient notes nails are thick and elongated, causing pain in shoe gear when ambulating.  He also has a callus on the left submetatarsal 5 area.  Feels that the left hallux lateral nail border is feeling ingrown.  Denies drainage.  Denies injury.  Also notes secondary concern of dorsal left midfoot pain that occurs in the evening after he has been on his feet.  He and his family member prefer not to take any more pills.  They also note that the patient is hard of hearing.  PCP is [name], MD.  Allergies  Allergen Reactions   Erythromycin Other (See Comments)    Severe Abdominal Pain   Penicillins Hives    Review of Systems: Negative except as noted in the HPI.  Objective:  There were no vitals filed for this visit.  Caymen Dubray is a pleasant 82 y.o. male in NAD. AAO x 3.  Vascular Examination: Patient has palpable DP pulse, absent PT pulse bilateral.  Delayed capillary refill bilateral toes.  Sparse digital hair bilateral.  Proximal to distal cooling WNL bilateral.    Dermatological Examination: Interspaces are clear with no open lesions noted bilateral.  Nails are 3-90mm thick, with yellowish/brown discoloration, subungual debris and distal onycholysis x10.  There is pain with compression of nails x10.  There are hyperkeratotic lesions noted left submet 5.  Neurological Examination: Protective sensation intact b/l LE. Vibratory sensation intact b/l LE.  Musculoskeletal Examination: Muscle strength 5/5 to all LE muscle groups b/l.      Latest Ref Rng & Units 07/29/2022   12:53 PM 02/07/2022     5:34 AM  Hemoglobin A1C  Hemoglobin-A1c 4.8 - 5.6 % 5.3  5.3     Patient qualifies for at-risk foot care because of diabetes with PVD and neuropathy..  Assessment/Plan: 1. Controlled type 2 diabetes mellitus without complication, without long-term current use of insulin (HCC)   2. Acquired hammertoes of both feet   3. Dermatophytosis of nail   4. PVD (peripheral vascular disease) (HCC)   5. Callus of foot     No orders of the defined types were placed in this encounter.  FOR HOME USE ONLY DME DIABETIC SHOE  Mycotic nails x10 were sharply debrided with sterile nail nippers and power debriding burr to decrease bulk and length.  Left hallux lateral nail border cut back to alleviate discomfort.  Discussed PNA procedure in the future if the ingrown nail recurs.  Hyperkeratotic lesions x 1 were shaved with #312 blade.  Patient will be a good candidate for the diabetic shoe program due to his diabetes with neuropathy and hammertoes of the feet.  Order provided today  Recommended Voltaren gel to be applied daily for the dorsal midfoot pain.  This is medical most likely arthritic discomfort and can be managed topically.   Return in about 3 months (around 02/10/2023) for [facility]   Clerance Lav, DPM, FACFAS [facility]     [address]  [place], [place] [address]                Office [phone]  Fax [phone]

## 2022-11-11 ENCOUNTER — Other Ambulatory Visit: Payer: Self-pay | Admitting: *Deleted

## 2022-11-11 DIAGNOSIS — N1831 Chronic kidney disease, stage 3a: Secondary | ICD-10-CM | POA: Diagnosis not present

## 2022-11-11 DIAGNOSIS — M810 Age-related osteoporosis without current pathological fracture: Secondary | ICD-10-CM | POA: Diagnosis not present

## 2022-11-11 DIAGNOSIS — Z7982 Long term (current) use of aspirin: Secondary | ICD-10-CM | POA: Diagnosis not present

## 2022-11-11 DIAGNOSIS — J9601 Acute respiratory failure with hypoxia: Secondary | ICD-10-CM | POA: Diagnosis not present

## 2022-11-11 DIAGNOSIS — I959 Hypotension, unspecified: Secondary | ICD-10-CM | POA: Diagnosis not present

## 2022-11-11 DIAGNOSIS — Z87891 Personal history of nicotine dependence: Secondary | ICD-10-CM | POA: Diagnosis not present

## 2022-11-11 DIAGNOSIS — I25119 Atherosclerotic heart disease of native coronary artery with unspecified angina pectoris: Secondary | ICD-10-CM | POA: Diagnosis not present

## 2022-11-11 DIAGNOSIS — Z87440 Personal history of urinary (tract) infections: Secondary | ICD-10-CM | POA: Diagnosis not present

## 2022-11-11 DIAGNOSIS — Z9181 History of falling: Secondary | ICD-10-CM | POA: Diagnosis not present

## 2022-11-11 DIAGNOSIS — I130 Hypertensive heart and chronic kidney disease with heart failure and stage 1 through stage 4 chronic kidney disease, or unspecified chronic kidney disease: Secondary | ICD-10-CM | POA: Diagnosis not present

## 2022-11-11 DIAGNOSIS — I214 Non-ST elevation (NSTEMI) myocardial infarction: Secondary | ICD-10-CM | POA: Diagnosis not present

## 2022-11-11 DIAGNOSIS — I77812 Thoracoabdominal aortic ectasia: Secondary | ICD-10-CM | POA: Diagnosis not present

## 2022-11-11 DIAGNOSIS — E785 Hyperlipidemia, unspecified: Secondary | ICD-10-CM | POA: Diagnosis not present

## 2022-11-11 DIAGNOSIS — I255 Ischemic cardiomyopathy: Secondary | ICD-10-CM

## 2022-11-11 DIAGNOSIS — M1990 Unspecified osteoarthritis, unspecified site: Secondary | ICD-10-CM | POA: Diagnosis not present

## 2022-11-11 DIAGNOSIS — I440 Atrioventricular block, first degree: Secondary | ICD-10-CM | POA: Diagnosis not present

## 2022-11-11 DIAGNOSIS — I5020 Unspecified systolic (congestive) heart failure: Secondary | ICD-10-CM

## 2022-11-11 DIAGNOSIS — M109 Gout, unspecified: Secondary | ICD-10-CM | POA: Diagnosis not present

## 2022-11-11 DIAGNOSIS — Z48812 Encounter for surgical aftercare following surgery on the circulatory system: Secondary | ICD-10-CM | POA: Diagnosis not present

## 2022-11-11 DIAGNOSIS — I5023 Acute on chronic systolic (congestive) heart failure: Secondary | ICD-10-CM | POA: Diagnosis not present

## 2022-11-11 DIAGNOSIS — Z7902 Long term (current) use of antithrombotics/antiplatelets: Secondary | ICD-10-CM | POA: Diagnosis not present

## 2022-11-11 DIAGNOSIS — I083 Combined rheumatic disorders of mitral, aortic and tricuspid valves: Secondary | ICD-10-CM | POA: Diagnosis not present

## 2022-11-11 DIAGNOSIS — J849 Interstitial pulmonary disease, unspecified: Secondary | ICD-10-CM | POA: Diagnosis not present

## 2022-11-11 DIAGNOSIS — E1122 Type 2 diabetes mellitus with diabetic chronic kidney disease: Secondary | ICD-10-CM | POA: Diagnosis not present

## 2022-11-11 DIAGNOSIS — Z7984 Long term (current) use of oral hypoglycemic drugs: Secondary | ICD-10-CM | POA: Diagnosis not present

## 2022-11-11 MED ORDER — FUROSEMIDE 80 MG PO TABS: 80.0000 mg | ORAL_TABLET | Freq: Every day | ORAL | 6 refills | Status: DC

## 2022-11-11 MED ORDER — SPIRONOLACTONE 25 MG PO TABS
12.5000 mg | ORAL_TABLET | Freq: Every day | ORAL | 3 refills | Status: DC
Start: 2022-11-11 — End: 2023-03-23

## 2022-11-12 DIAGNOSIS — E1122 Type 2 diabetes mellitus with diabetic chronic kidney disease: Secondary | ICD-10-CM | POA: Diagnosis not present

## 2022-11-12 DIAGNOSIS — I5023 Acute on chronic systolic (congestive) heart failure: Secondary | ICD-10-CM | POA: Diagnosis not present

## 2022-11-12 DIAGNOSIS — I130 Hypertensive heart and chronic kidney disease with heart failure and stage 1 through stage 4 chronic kidney disease, or unspecified chronic kidney disease: Secondary | ICD-10-CM | POA: Diagnosis not present

## 2022-11-12 DIAGNOSIS — I214 Non-ST elevation (NSTEMI) myocardial infarction: Secondary | ICD-10-CM | POA: Diagnosis not present

## 2022-11-12 DIAGNOSIS — N1831 Chronic kidney disease, stage 3a: Secondary | ICD-10-CM | POA: Diagnosis not present

## 2022-11-12 DIAGNOSIS — J9601 Acute respiratory failure with hypoxia: Secondary | ICD-10-CM | POA: Diagnosis not present

## 2022-11-13 ENCOUNTER — Ambulatory Visit: Payer: Medicare Other | Admitting: Cardiology

## 2022-11-16 ENCOUNTER — Ambulatory Visit: Payer: Self-pay

## 2022-11-16 NOTE — Patient Outreach (Signed)
  Care Coordination   Follow Up Visit Note   11/16/2022 Name: [name] Steele MRN: [id number] DOB: Nov 25, 1940  [name] Steele is a 82 y.o. year old male who sees [name], MD for primary care. I  spoke with [facility] by phone today.    What matters to the patients health and wellness today?  Managing health    Goals Addressed             This Visit's Progress    Heart Failure Management       Patient Goals/Self Care Activities: -Patient/Caregiver will self-administer medications as prescribed as evidenced by self-report/primary caregiver report  -Patient/Caregiver will attend all scheduled provider appointments as evidenced by clinician review of documented attendance to scheduled appointments and patient/caregiver report -Patient/Caregiver will call provider office for new concerns or questions as evidenced by review of documented incoming telephone call notes and patient report  -trim toenails straight across -wear comfortable, well-fitting shoes -Weigh daily and record (notify MD with 3 lb weight gain over night or 5 lb in a week) -Follow CHF Action Plan -Adhere to low sodium diet   Spoke with spouse. She reports patient doing ok.  Patient walking to mailbox by himself in the AM.  Weight 139.3 lbs. No problems with medications.  Gets medications from [facility].  Discussed continued heart failure management.             SDOH assessments and interventions completed:  Yes     Care Coordination Interventions:  Yes, provided   Follow up plan: Follow up call scheduled for August    Encounter Outcome:  Pt. Visit Completed   Bary Leriche, RN, MSN [facility] Care Management Care Management Coordinator Direct Line [phone]

## 2022-11-16 NOTE — Patient Instructions (Signed)
Visit Information  Thank you for taking time to visit with me today. Please don't hesitate to contact me if I can be of assistance to you.   Following are the goals we discussed today:   Goals Addressed             This Visit's Progress    Heart Failure Management       Patient Goals/Self Care Activities: -Patient/Caregiver will self-administer medications as prescribed as evidenced by self-report/primary caregiver report  -Patient/Caregiver will attend all scheduled provider appointments as evidenced by clinician review of documented attendance to scheduled appointments and patient/caregiver report -Patient/Caregiver will call provider office for new concerns or questions as evidenced by review of documented incoming telephone call notes and patient report  -trim toenails straight across -wear comfortable, well-fitting shoes -Weigh daily and record (notify MD with 3 lb weight gain over night or 5 lb in a week) -Follow CHF Action Plan -Adhere to low sodium diet   Spoke with spouse. She reports patient doing ok.  Patient walking to mailbox by himself in the AM.  Weight 139.3 lbs. No problems with medications.  Gets medications from [facility].  Discussed continued heart failure management.             Our next appointment is by telephone on 12/17/22 at 1200 pm  Please call the care guide team at [phone] if you need to cancel or reschedule your appointment.   If you are experiencing a Mental Health or Behavioral Health Crisis or need someone to talk to, please call the Suicide and Crisis Lifeline: 988   Patient verbalizes understanding of instructions and care plan provided today and agrees to view in MyChart. Active MyChart status and patient understanding of how to access instructions and care plan via MyChart confirmed with patient.     The patient has been provided with contact information for the care management team and has been advised to call with any health  related questions or concerns.   [name] Leriche, RN, MSN [facility] Care Management Care Management Coordinator Direct Line [phone]

## 2022-11-23 ENCOUNTER — Telehealth: Payer: Self-pay

## 2022-11-23 NOTE — Patient Instructions (Signed)
Visit Information  Thank you for taking time to visit with me today. Please don't hesitate to contact me if I can be of assistance to you.   Following are the goals we discussed today:   Goals Addressed             This Visit's Progress    Heart Failure Management       Patient Goals/Self Care Activities: -Patient/Caregiver will self-administer medications as prescribed as evidenced by self-report/primary caregiver report  -Patient/Caregiver will attend all scheduled provider appointments as evidenced by clinician review of documented attendance to scheduled appointments and patient/caregiver report -Patient/Caregiver will call provider office for new concerns or questions as evidenced by review of documented incoming telephone call notes and patient report  -trim toenails straight across -wear comfortable, well-fitting shoes -Weigh daily and record (notify MD with 3 lb weight gain over night or 5 lb in a week) -Follow CHF Action Plan -Adhere to low sodium diet   Incoming call from spouse [place].  She is concerned about patient losing weight.  Discussed food intake. Patient appetite has not been the greatest.  Patient drinking Boost about once a day.  Suggested trying at least 2 per day to increase caloric intake.  She verbalized understanding. Discussed lasix dosing.  Advised if changes need to be made the cardiologist would make changes.  She verbalized understanding.               Our next appointment is by telephone on 12/17/22  Please call the care guide team at [phone] if you need to cancel or reschedule your appointment.   If you are experiencing a Mental Health or Behavioral Health Crisis or need someone to talk to, please call the Suicide and Crisis Lifeline: 988   Patient verbalizes understanding of instructions and care plan provided today and agrees to view in MyChart. Active MyChart status and patient understanding of how to access instructions and care plan via  MyChart confirmed with patient.     The patient has been provided with contact information for the care management team and has been advised to call with any health related questions or concerns.   Bary Leriche, RN, MSN [facility] Care Management Care Management Coordinator Direct Line [phone]

## 2022-11-23 NOTE — Patient Outreach (Signed)
  Care Coordination   Follow Up Visit Note   11/23/2022 Name: [name] Steele MRN: [id number] DOB: [birth year]  [name] Steele is a 82 y.o. year old male who sees [name] Brunette, MD for primary care. I  received incoming call from Wife [name] Steele.    What matters to the patients health and wellness today?  Weight loss    Goals Addressed             This Visit's Progress    Heart Failure Management       Patient Goals/Self Care Activities: -Patient/Caregiver will self-administer medications as prescribed as evidenced by self-report/primary caregiver report  -Patient/Caregiver will attend all scheduled provider appointments as evidenced by clinician review of documented attendance to scheduled appointments and patient/caregiver report -Patient/Caregiver will call provider office for new concerns or questions as evidenced by review of documented incoming telephone call notes and patient report  -trim toenails straight across -wear comfortable, well-fitting shoes -Weigh daily and record (notify MD with 3 lb weight gain over night or 5 lb in a week) -Follow CHF Action Plan -Adhere to low sodium diet   Incoming call from spouse [name] Steele.  She is concerned about patient losing weight.  Discussed food intake. Patient appetite has not been the greatest.  Patient drinking Boost about once a day.  Suggested trying at least 2 per day to increase caloric intake.  She verbalized understanding. Discussed lasix dosing.  Advised if changes need to be made the cardiologist would make changes.  She verbalized understanding.               SDOH assessments and interventions completed:  Yes     Care Coordination Interventions:  Yes, provided   Follow up plan: Follow up call scheduled for August    Encounter Outcome:  Pt. Visit Completed   [name] Leriche, RN, MSN [facility] Care Management Care Management Coordinator Direct Line [phone]

## 2022-11-26 DIAGNOSIS — I130 Hypertensive heart and chronic kidney disease with heart failure and stage 1 through stage 4 chronic kidney disease, or unspecified chronic kidney disease: Secondary | ICD-10-CM | POA: Diagnosis not present

## 2022-11-26 DIAGNOSIS — E1122 Type 2 diabetes mellitus with diabetic chronic kidney disease: Secondary | ICD-10-CM | POA: Diagnosis not present

## 2022-11-26 DIAGNOSIS — J9601 Acute respiratory failure with hypoxia: Secondary | ICD-10-CM | POA: Diagnosis not present

## 2022-11-26 DIAGNOSIS — I5023 Acute on chronic systolic (congestive) heart failure: Secondary | ICD-10-CM | POA: Diagnosis not present

## 2022-11-26 DIAGNOSIS — N1831 Chronic kidney disease, stage 3a: Secondary | ICD-10-CM | POA: Diagnosis not present

## 2022-11-26 DIAGNOSIS — I214 Non-ST elevation (NSTEMI) myocardial infarction: Secondary | ICD-10-CM | POA: Diagnosis not present

## 2022-12-01 DIAGNOSIS — I214 Non-ST elevation (NSTEMI) myocardial infarction: Secondary | ICD-10-CM | POA: Diagnosis not present

## 2022-12-01 DIAGNOSIS — E1122 Type 2 diabetes mellitus with diabetic chronic kidney disease: Secondary | ICD-10-CM | POA: Diagnosis not present

## 2022-12-01 DIAGNOSIS — I5023 Acute on chronic systolic (congestive) heart failure: Secondary | ICD-10-CM | POA: Diagnosis not present

## 2022-12-01 DIAGNOSIS — I130 Hypertensive heart and chronic kidney disease with heart failure and stage 1 through stage 4 chronic kidney disease, or unspecified chronic kidney disease: Secondary | ICD-10-CM | POA: Diagnosis not present

## 2022-12-01 DIAGNOSIS — N1831 Chronic kidney disease, stage 3a: Secondary | ICD-10-CM | POA: Diagnosis not present

## 2022-12-01 DIAGNOSIS — J9601 Acute respiratory failure with hypoxia: Secondary | ICD-10-CM | POA: Diagnosis not present

## 2022-12-02 ENCOUNTER — Ambulatory Visit (INDEPENDENT_AMBULATORY_CARE_PROVIDER_SITE_OTHER): Payer: Medicare Other | Admitting: Podiatry

## 2022-12-02 DIAGNOSIS — E119 Type 2 diabetes mellitus without complications: Secondary | ICD-10-CM

## 2022-12-02 DIAGNOSIS — M2042 Other hammer toe(s) (acquired), left foot: Secondary | ICD-10-CM

## 2022-12-02 DIAGNOSIS — M2041 Other hammer toe(s) (acquired), right foot: Secondary | ICD-10-CM

## 2022-12-02 NOTE — Progress Notes (Signed)
   Established Patient Office Visit  Subjective   Patient ID: Deray Dawes, male    DOB: 23-Mar-1941  Age: 82 y.o. MRN: [id number]  No chief complaint on file.   HPI    ROS    Objective:     There were no vitals taken for this visit.   Physical Exam   No results found for any visits on 12/02/22.    The ASCVD Risk score (Arnett DK, et al., 2019) failed to calculate for the following reasons:   The 2019 ASCVD risk score is only valid for ages 66 to 72   The patient has a prior MI or stroke diagnosis    Assessment & Plan:   Problem List Items Addressed This Visit   None Visit Diagnoses     Controlled type 2 diabetes mellitus without complication, without long-term current use of insulin (HCC)    -  Primary   Acquired hammertoes of both feet           No follow-ups on file.    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

## 2022-12-04 DIAGNOSIS — E1140 Type 2 diabetes mellitus with diabetic neuropathy, unspecified: Secondary | ICD-10-CM | POA: Diagnosis not present

## 2022-12-09 DIAGNOSIS — E1140 Type 2 diabetes mellitus with diabetic neuropathy, unspecified: Secondary | ICD-10-CM | POA: Diagnosis not present

## 2022-12-09 DIAGNOSIS — M1A09X1 Idiopathic chronic gout, multiple sites, with tophus (tophi): Secondary | ICD-10-CM | POA: Diagnosis not present

## 2022-12-09 DIAGNOSIS — Z0000 Encounter for general adult medical examination without abnormal findings: Secondary | ICD-10-CM | POA: Diagnosis not present

## 2022-12-09 DIAGNOSIS — I700 Atherosclerosis of aorta: Secondary | ICD-10-CM | POA: Diagnosis not present

## 2022-12-09 DIAGNOSIS — J439 Emphysema, unspecified: Secondary | ICD-10-CM | POA: Diagnosis not present

## 2022-12-09 DIAGNOSIS — R5381 Other malaise: Secondary | ICD-10-CM | POA: Diagnosis not present

## 2022-12-09 DIAGNOSIS — R300 Dysuria: Secondary | ICD-10-CM | POA: Diagnosis not present

## 2022-12-09 DIAGNOSIS — I5042 Chronic combined systolic (congestive) and diastolic (congestive) heart failure: Secondary | ICD-10-CM | POA: Diagnosis not present

## 2022-12-09 DIAGNOSIS — N1831 Chronic kidney disease, stage 3a: Secondary | ICD-10-CM | POA: Diagnosis not present

## 2022-12-09 DIAGNOSIS — I2510 Atherosclerotic heart disease of native coronary artery without angina pectoris: Secondary | ICD-10-CM | POA: Diagnosis not present

## 2022-12-09 DIAGNOSIS — I10 Essential (primary) hypertension: Secondary | ICD-10-CM | POA: Diagnosis not present

## 2022-12-09 DIAGNOSIS — D692 Other nonthrombocytopenic purpura: Secondary | ICD-10-CM | POA: Diagnosis not present

## 2022-12-10 DIAGNOSIS — J9601 Acute respiratory failure with hypoxia: Secondary | ICD-10-CM | POA: Diagnosis not present

## 2022-12-10 DIAGNOSIS — N1831 Chronic kidney disease, stage 3a: Secondary | ICD-10-CM | POA: Diagnosis not present

## 2022-12-10 DIAGNOSIS — I214 Non-ST elevation (NSTEMI) myocardial infarction: Secondary | ICD-10-CM | POA: Diagnosis not present

## 2022-12-10 DIAGNOSIS — I130 Hypertensive heart and chronic kidney disease with heart failure and stage 1 through stage 4 chronic kidney disease, or unspecified chronic kidney disease: Secondary | ICD-10-CM | POA: Diagnosis not present

## 2022-12-10 DIAGNOSIS — I5023 Acute on chronic systolic (congestive) heart failure: Secondary | ICD-10-CM | POA: Diagnosis not present

## 2022-12-10 DIAGNOSIS — E1122 Type 2 diabetes mellitus with diabetic chronic kidney disease: Secondary | ICD-10-CM | POA: Diagnosis not present

## 2022-12-10 DIAGNOSIS — E119 Type 2 diabetes mellitus without complications: Secondary | ICD-10-CM | POA: Diagnosis not present

## 2022-12-17 ENCOUNTER — Ambulatory Visit: Payer: Self-pay

## 2022-12-17 NOTE — Patient Instructions (Signed)
Visit Information  Thank you for taking time to visit with me today. Please don't hesitate to contact me if I can be of assistance to you.   Following are the goals we discussed today:   Goals Addressed             This Visit's Progress    Heart Failure Management       Patient Goals/Self Care Activities: -Patient/Caregiver will self-administer medications as prescribed as evidenced by self-report/primary caregiver report  -Patient/Caregiver will attend all scheduled provider appointments as evidenced by clinician review of documented attendance to scheduled appointments and patient/caregiver report -Patient/Caregiver will call provider office for new concerns or questions as evidenced by review of documented incoming telephone call notes and patient report  -trim toenails straight across -wear comfortable, well-fitting shoes -Weigh daily and record (notify MD with 3 lb weight gain over night or 5 lb in a week) -Follow CHF Action Plan -Adhere to low sodium diet   I spoke with spouse.  She reports patient doing very well.  Weight loss has stopped. Patient drinking 3 boost per day and eating small meals.  Most recent weight 134 lbs.  Discussed heart failure management.  No concerns.         Our next appointment is by telephone on 01/28/23 at 1200 pm  Please call the care guide team at [phone] if you need to cancel or reschedule your appointment.   If you are experiencing a Mental Health or Behavioral Health Crisis or need someone to talk to, please call the Suicide and Crisis Lifeline: 988   Patient verbalizes understanding of instructions and care plan provided today and agrees to view in MyChart. Active MyChart status and patient understanding of how to access instructions and care plan via MyChart confirmed with patient.     The patient has been provided with contact information for the care management team and has been advised to call with any health related questions or  concerns.   Bary Leriche, RN, MSN [facility] Care Management Care Management Coordinator Direct Line [phone]

## 2022-12-17 NOTE — Patient Outreach (Signed)
  Care Coordination   Follow Up Visit Note   12/17/2022 Name: [name] Steele MRN: [id number] DOB: August 20, 1940  [name] Steele is a 82 y.o. year old male who sees [name] Brunette, MD for primary care. I spoke with  [name] by phone today.  What matters to the patients health and wellness today?  Maintain health/CHF    Goals Addressed             This Visit's Progress    Heart Failure Management       Patient Goals/Self Care Activities: -Patient/Caregiver will self-administer medications as prescribed as evidenced by self-report/primary caregiver report  -Patient/Caregiver will attend all scheduled provider appointments as evidenced by clinician review of documented attendance to scheduled appointments and patient/caregiver report -Patient/Caregiver will call provider office for new concerns or questions as evidenced by review of documented incoming telephone call notes and patient report  -trim toenails straight across -wear comfortable, well-fitting shoes -Weigh daily and record (notify MD with 3 lb weight gain over night or 5 lb in a week) -Follow CHF Action Plan -Adhere to low sodium diet   I spoke with spouse.  She reports patient doing very well.  Weight loss has stopped. Patient drinking 3 boost per day and eating small meals.  Most recent weight 134 lbs.  Discussed heart failure management.  No concerns.         SDOH assessments and interventions completed:  Yes     Care Coordination Interventions:  Yes, provided    Follow up plan: Follow up call scheduled for September    Encounter Outcome:  Pt. Visit Completed   Bary Leriche, RN, MSN [facility] Care Management Care Management Coordinator Direct Line [phone]

## 2023-01-28 ENCOUNTER — Telehealth: Payer: Self-pay

## 2023-01-28 ENCOUNTER — Ambulatory Visit: Payer: Self-pay

## 2023-01-28 NOTE — Patient Outreach (Signed)
  Care Coordination   Follow Up Visit Note   01/28/2023 Name: [name] Steele MRN: [id number] DOB: 12-Sep-1940  [name] Steele is a 82 y.o. year old male who sees [name] Brunette, MD for primary care. I  spoke with spouse [name] Steele by phone today  What matters to the patients health and wellness today?  Maintain health    Goals Addressed             This Visit's Progress    Heart Failure Management       Patient Goals/Self Care Activities: -Patient/Caregiver will self-administer medications as prescribed as evidenced by self-report/primary caregiver report  -Patient/Caregiver will attend all scheduled provider appointments as evidenced by clinician review of documented attendance to scheduled appointments and patient/caregiver report -Patient/Caregiver will call provider office for new concerns or questions as evidenced by review of documented incoming telephone call notes and patient report  -trim toenails straight across -wear comfortable, well-fitting shoes -Weigh daily and record (notify MD with 3 lb weight gain over night or 5 lb in a week) -Follow CHF Action Plan -Adhere to low sodium diet   I spoke with spouse.  She reports patient doing okay..  Weight loss has returned. Patient drinking 3 boost per day and eating small meals.  Most recent weight 130.8 lbs.  Discussed heart failure management and intake of high calorie foods and proteins.  No concerns.         SDOH assessments and interventions completed:  Yes     Care Coordination Interventions:  Yes, provided   Follow up plan: Follow up call scheduled for October    Encounter Outcome:  Patient Visit Completed   Bary Leriche, RN, MSN [place]  [facility], [facility] Management Community Coordinator Direct Dial: [phone]  Fax: [phone] Website: [email or web address]

## 2023-01-28 NOTE — Patient Outreach (Signed)
  Care Coordination   01/28/2023 Name: [name] Steele MRN: [id number] DOB: 26-Jun-1940   Care Coordination Outreach Attempts:  An unsuccessful telephone outreach was attempted today to offer the patient information about available care coordination services.  Follow Up Plan:  Additional outreach attempts will be made to offer the patient care coordination information and services.   Encounter Outcome:  No Answer   Care Coordination Interventions:  No, not indicated    Bary Leriche, RN, MSN [facility]  [facility], [facility] Management Community Coordinator Direct Dial: [phone]  Fax: [phone] Website: [email or web address]

## 2023-01-28 NOTE — Patient Instructions (Signed)
Visit Information  Thank you for taking time to visit with me today. Please don't hesitate to contact me if I can be of assistance to you.   Following are the goals we discussed today:   Goals Addressed             This Visit's Progress    Heart Failure Management       Patient Goals/Self Care Activities: -Patient/Caregiver will self-administer medications as prescribed as evidenced by self-report/primary caregiver report  -Patient/Caregiver will attend all scheduled provider appointments as evidenced by clinician review of documented attendance to scheduled appointments and patient/caregiver report -Patient/Caregiver will call provider office for new concerns or questions as evidenced by review of documented incoming telephone call notes and patient report  -trim toenails straight across -wear comfortable, well-fitting shoes -Weigh daily and record (notify MD with 3 lb weight gain over night or 5 lb in a week) -Follow CHF Action Plan -Adhere to low sodium diet   I spoke with spouse.  She reports patient doing okay..  Weight loss has returned. Patient drinking 3 boost per day and eating small meals.  Most recent weight 130.8 lbs.  Discussed heart failure management and intake of high calorie foods and proteins.  No concerns.         Our next appointment is by telephone on 02/1723 at 1200 pm  Please call the care guide team at [phone] if you need to cancel or reschedule your appointment.   If you are experiencing a Mental Health or Behavioral Health Crisis or need someone to talk to, please call the Suicide and Crisis Lifeline: 988   Patient verbalizes understanding of instructions and care plan provided today and agrees to view in MyChart. Active MyChart status and patient understanding of how to access instructions and care plan via MyChart confirmed with patient.     The patient has been provided with contact information for the care management team and has been advised to  call with any health related questions or concerns.   Bary Leriche, RN, MSN [facility], [facility] Management Community Coordinator Direct Dial: [phone]  Fax: [phone] Website: [email or web address]

## 2023-02-03 ENCOUNTER — Encounter: Payer: Self-pay | Admitting: Cardiology

## 2023-02-03 ENCOUNTER — Ambulatory Visit: Payer: Medicare Other | Attending: Cardiology | Admitting: Cardiology

## 2023-02-03 VITALS — BP 120/78 | HR 80 | Ht 70.0 in | Wt 136.2 lb

## 2023-02-03 DIAGNOSIS — I2510 Atherosclerotic heart disease of native coronary artery without angina pectoris: Secondary | ICD-10-CM | POA: Diagnosis present

## 2023-02-03 DIAGNOSIS — I5022 Chronic systolic (congestive) heart failure: Secondary | ICD-10-CM

## 2023-02-03 DIAGNOSIS — N1831 Chronic kidney disease, stage 3a: Secondary | ICD-10-CM | POA: Diagnosis present

## 2023-02-03 NOTE — Patient Instructions (Signed)
Medication Instructions:  May decrease Furosemide to 40 mg daily.  Weigh daily.  If weight increases go back to 80 mg a day. Continue all other medications as listed.  *If you need a refill on your cardiac medications before your next appointment, please call your pharmacy*   Follow-Up: At [facility], you and your health needs are our priority.  As part of our continuing mission to provide you with exceptional heart care, we have created designated Provider Care Teams.  These Care Teams include your primary Cardiologist (physician) and Advanced Practice Providers (APPs -  Physician Assistants and Nurse Practitioners) who all work together to provide you with the care you need, when you need it.  We recommend signing up for the patient portal called "MyChart".  Sign up information is provided on this After Visit Summary.  MyChart is used to connect with patients for Virtual Visits (Telemedicine).  Patients are able to view lab/test results, encounter notes, upcoming appointments, etc.  Non-urgent messages can be sent to your provider as well.   To learn more about what you can do with MyChart, go to [email or web address].    Your next appointment:   6 month(s)  Provider:   Ronie Spies, PA-C     Then, Donato Schultz, MD will plan to see you again in 1 year(s).

## 2023-02-03 NOTE — Progress Notes (Signed)
Cardiology Office Note:  .   Date:  02/03/2023  ID:  [name] Steele, DOB 1940/08/02, MRN [id number] PCP: [name], MD  [address] [facility] Providers Cardiologist:  Donato Schultz, MD     History of Present Illness: .   [name] Steele is a 82 y.o. male here for follow up CAD.  03/2021 calcium score ordered by PCP showed CAC 6,230 (97%ile). Study also picked up evidence of chronic ILD. Patient declined referral to pulmonology. Nuclear stress test was positive for small territory of ischemia but felt to be low risk so Dr. Anne Fu managed this medically. Statin was added.   He was admitted 10/05/22 with NSTEMI. LHC demonstrated critical LM stenosis and severe p LAD and OM stenoses, EF 30%, required IABP. Formal echo 10/05/22 showed EF 20-25%.  CABG considered but patient was concerned that he would not recover from surgery and opted for medical management only. Code status changed to DNR. He was seen by Palliative Care and declined outpatient palliative services.   Discussed the use of AI scribe software  History of Present Illness   The patient, with a history of coronary artery disease and chronic kidney disease, presents for follow-up after a recent hospitalization for  myocardial infarction, systolic HF. He reports intermittent chest discomfort, described as a 'little sun Steele,' which occurs sporadically, often during activities such as cooking. The discomfort is not severe and resolves with rest. He also reports increased urinary frequency, which he attributes to his current diuretic regimen of Lasix and Spironolactone. He expresses concern about potential nutrient loss due to frequent urination and fluctuating weight. Additionally, he reports experiencing a burning pain during urination, which he suspects may be due to a chronic infection. He is scheduled to see a urologist for further evaluation.         Studies Reviewed: .        10/23/22 BNP 2600, K 3.8, Cr 1.5, albumin 3.2, AST/ALT  OK 09/2022 Mg 1.8, Hgb 11.3, plt ok, LPa 34.8 07/2022 A1c 5.3 09/2021 TSH wnl  LABS Creatinine: 1.4 LDL: 31  DIAGNOSTIC ECHO 10/05/22 - EF 20%, Aortic stenosis: Mild Risk Assessment/Calculations:            Physical Exam:   VS:  BP 120/78   Pulse 80   Ht 5\' 10"  (1.778 m)   Wt 136 lb 3.2 oz (61.8 kg)   SpO2 98%   BMI 19.54 kg/m    Wt Readings from Last 3 Encounters:  02/03/23 136 lb 3.2 oz (61.8 kg)  11/02/22 147 lb (66.7 kg)  10/23/22 152 lb (68.9 kg)    GEN: Thin, well developed in no acute distress NECK: No JVD; No carotid bruits CARDIAC: RRR, no murmurs, rubs, gallops RESPIRATORY:  Clear to auscultation without rales, wheezing or rhonchi  ABDOMEN: Soft, non-tender, non-distended EXTREMITIES:  No edema; No deformity   ASSESSMENT AND PLAN: .    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 Reports intermittent angina, particularly with exertion. -Continue current medications including Ranexa, Metoprolol, Aspirin 81mg , and Plavix 75mg . -No SGLT2 - UTI, no entresto - renal and BP.  -NYHA 2 -No ICD -Here with wife  Chronic Kidney Disease Creatinine 1.4, indicating mild chronic kidney disease. -Consider referral to nephrology if further deterioration in renal function.  Frequent Urination Reports frequent urination, likely secondary to Lasix 80mg  daily. -Consider reducing Lasix to 40mg  daily.  Monitor weight closely for signs of fluid retention and increase back to 80mg  if weight increases significantly.  Urinary Pain Reports pain with urination, suspecting urinary tract infection. -Has appointment with urologist for further evaluation and management.  Hyperlipidemia LDL 31, well controlled on Crestor 20mg . -Continue current dose.  Mild Aortic Stenosis Noted on previous imaging, currently  asymptomatic. -No change in management, continue monitoring.  General Health Maintenance -Continue current medications and lifestyle modifications. -Follow up after urology appointment to discuss findings and any changes in management.            Dispo: 6 Dayna  Signed, Donato Schultz, MD

## 2023-02-04 DIAGNOSIS — N400 Enlarged prostate without lower urinary tract symptoms: Secondary | ICD-10-CM | POA: Diagnosis not present

## 2023-02-04 DIAGNOSIS — R3121 Asymptomatic microscopic hematuria: Secondary | ICD-10-CM | POA: Diagnosis not present

## 2023-02-04 DIAGNOSIS — N3020 Other chronic cystitis without hematuria: Secondary | ICD-10-CM | POA: Diagnosis not present

## 2023-02-23 ENCOUNTER — Ambulatory Visit (INDEPENDENT_AMBULATORY_CARE_PROVIDER_SITE_OTHER): Payer: Medicare Other | Admitting: Podiatry

## 2023-02-23 DIAGNOSIS — M79674 Pain in right toe(s): Secondary | ICD-10-CM | POA: Diagnosis not present

## 2023-02-23 DIAGNOSIS — M79675 Pain in left toe(s): Secondary | ICD-10-CM

## 2023-02-23 DIAGNOSIS — E1151 Type 2 diabetes mellitus with diabetic peripheral angiopathy without gangrene: Secondary | ICD-10-CM | POA: Diagnosis not present

## 2023-02-23 DIAGNOSIS — B351 Tinea unguium: Secondary | ICD-10-CM | POA: Diagnosis not present

## 2023-02-23 DIAGNOSIS — L84 Corns and callosities: Secondary | ICD-10-CM

## 2023-02-23 NOTE — Progress Notes (Unsigned)
Subjective:  Patient ID: [name] Steele, male    DOB: December 09, 1940,  MRN: [id number]  [name] Steele presents to clinic today for:  Chief Complaint  Patient presents with   Diabetes    [name] Steele   Patient notes nails are thick and elongated, causing pain in shoe gear when ambulating.  He has a painful callus left submet 5.  He forgot to wear his hearing aid today.  PCP is [name] Brunette, MD. - date last seen was 11/06/22.  Past Medical History:  Diagnosis Date   Arthritis    CAD (coronary artery disease) 10/05/2022   Cath 09/2022: 3v CAD, critical LM stenosis - Pt opted for med Rx only>>DNR   Chronic kidney disease, stage 3a (HCC)    Chronic urinary tract infection    Diabetes mellitus without complication (HCC)    Gout    Heart murmur    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   S/p non-STEMI 09/2022>> three-vessel and critical left main stenosis; patient opted for medical management // TTE 09/2022: EF 20-25, GR 2 DD, RVSP 59.1, trivial MR, mild-moderate TR, mild aortic stenosis, mean gradient 10, V-max 206 cm/s, RAP 15   History of non-ST elevation myocardial infarction (NSTEMI) 10/05/2022   Hypertension    ILD (interstitial lung disease) (HCC)    by CT 01/2021   Ischemic cardiomyopathy 10/10/2022   Mild aortic stenosis    Mild dilation of ascending aorta (HCC)    Osteoporosis     Allergies  Allergen Reactions   Erythromycin Other (See Comments)    Severe Abdominal Pain   Penicillins Hives    Objective:  [name] Steele is a pleasant 82 y.o. male in NAD. AAO x 3.  Vascular Examination: Patient has palpable DP pulse, absent PT pulse bilateral.  Delayed capillary refill bilateral toes.  Sparse digital hair bilateral.  Proximal to distal cooling WNL bilateral.    Dermatological Examination: Interspaces are clear with no open lesions noted bilateral.  Skin is shiny and atrophic bilateral.  Nails are 3-68mm thick, with yellowish/brown  discoloration, subungual debris and distal onycholysis x10.  There is pain with compression of nails x10.  There are hyperkeratotic lesions noted left submet 5 .     Latest Ref Rng & Units 07/29/2022   12:53 PM  Hemoglobin A1C  Hemoglobin-A1c 4.8 - 5.6 % 5.3    Patient qualifies for at-risk foot care because of diabetes with PVD. Marland Kitchen  Assessment/Plan: 1. Pain due to onychomycosis of toenails of both feet   2. Callus of foot   3. Type II diabetes mellitus with peripheral circulatory disorder (HCC)     Mycotic nails x10 were sharply debrided with sterile nail nippers and power debriding burr to decrease bulk and length.  Hyperkeratotic lesion x1 was shaved with #312 blade.   Return in about 3 months (around 05/26/2023) for [facility].   [name] Steele, DPM, FACFAS [facility]     [address] [place], [place] [address]                Office [phone]  Fax [phone]

## 2023-02-24 DIAGNOSIS — I7143 Infrarenal abdominal aortic aneurysm, without rupture: Secondary | ICD-10-CM | POA: Diagnosis not present

## 2023-02-24 DIAGNOSIS — N281 Cyst of kidney, acquired: Secondary | ICD-10-CM | POA: Diagnosis not present

## 2023-02-24 DIAGNOSIS — N400 Enlarged prostate without lower urinary tract symptoms: Secondary | ICD-10-CM | POA: Diagnosis not present

## 2023-02-24 DIAGNOSIS — N3020 Other chronic cystitis without hematuria: Secondary | ICD-10-CM | POA: Diagnosis not present

## 2023-02-24 NOTE — Progress Notes (Unsigned)
Per safe step shoe patient wanted is no longer avail. Spoke with souse today they will be in to chose another shoe later today  Addison Bailey Cped,  Shoes chosen A3200M and A6000M I have emailed safestep  Addison Bailey Cped, CFo, CFm

## 2023-02-27 ENCOUNTER — Emergency Department (HOSPITAL_COMMUNITY): Payer: Medicare Other

## 2023-02-27 ENCOUNTER — Other Ambulatory Visit: Payer: Self-pay

## 2023-02-27 ENCOUNTER — Inpatient Hospital Stay (HOSPITAL_COMMUNITY)
Admission: EM | Admit: 2023-02-27 | Discharge: 2023-03-02 | DRG: 698 | Disposition: A | Discharge status: Home Health | Payer: Medicare Other | Attending: Internal Medicine | Admitting: Internal Medicine

## 2023-02-27 ENCOUNTER — Encounter (HOSPITAL_COMMUNITY): Payer: Self-pay | Admitting: *Deleted

## 2023-02-27 DIAGNOSIS — I255 Ischemic cardiomyopathy: Secondary | ICD-10-CM | POA: Diagnosis not present

## 2023-02-27 DIAGNOSIS — I25119 Atherosclerotic heart disease of native coronary artery with unspecified angina pectoris: Secondary | ICD-10-CM | POA: Diagnosis present

## 2023-02-27 DIAGNOSIS — Z66 Do not resuscitate: Secondary | ICD-10-CM | POA: Diagnosis present

## 2023-02-27 DIAGNOSIS — R079 Chest pain, unspecified: Secondary | ICD-10-CM | POA: Diagnosis not present

## 2023-02-27 DIAGNOSIS — Z7984 Long term (current) use of oral hypoglycemic drugs: Secondary | ICD-10-CM | POA: Diagnosis not present

## 2023-02-27 DIAGNOSIS — E785 Hyperlipidemia, unspecified: Secondary | ICD-10-CM | POA: Diagnosis not present

## 2023-02-27 DIAGNOSIS — Z79899 Other long term (current) drug therapy: Secondary | ICD-10-CM

## 2023-02-27 DIAGNOSIS — Z87891 Personal history of nicotine dependence: Secondary | ICD-10-CM

## 2023-02-27 DIAGNOSIS — I5023 Acute on chronic systolic (congestive) heart failure: Secondary | ICD-10-CM | POA: Diagnosis not present

## 2023-02-27 DIAGNOSIS — N1832 Chronic kidney disease, stage 3b: Secondary | ICD-10-CM | POA: Diagnosis present

## 2023-02-27 DIAGNOSIS — I2583 Coronary atherosclerosis due to lipid rich plaque: Secondary | ICD-10-CM | POA: Diagnosis not present

## 2023-02-27 DIAGNOSIS — I129 Hypertensive chronic kidney disease with stage 1 through stage 4 chronic kidney disease, or unspecified chronic kidney disease: Secondary | ICD-10-CM | POA: Diagnosis present

## 2023-02-27 DIAGNOSIS — I200 Unstable angina: Secondary | ICD-10-CM | POA: Diagnosis not present

## 2023-02-27 DIAGNOSIS — Z7982 Long term (current) use of aspirin: Secondary | ICD-10-CM | POA: Diagnosis not present

## 2023-02-27 DIAGNOSIS — I951 Orthostatic hypotension: Secondary | ICD-10-CM | POA: Diagnosis not present

## 2023-02-27 DIAGNOSIS — N183 Chronic kidney disease, stage 3 unspecified: Secondary | ICD-10-CM | POA: Diagnosis present

## 2023-02-27 DIAGNOSIS — M1990 Unspecified osteoarthritis, unspecified site: Secondary | ICD-10-CM | POA: Diagnosis present

## 2023-02-27 DIAGNOSIS — R42 Dizziness and giddiness: Secondary | ICD-10-CM | POA: Diagnosis not present

## 2023-02-27 DIAGNOSIS — I5043 Acute on chronic combined systolic (congestive) and diastolic (congestive) heart failure: Secondary | ICD-10-CM | POA: Diagnosis present

## 2023-02-27 DIAGNOSIS — Z87440 Personal history of urinary (tract) infections: Secondary | ICD-10-CM

## 2023-02-27 DIAGNOSIS — I209 Angina pectoris, unspecified: Secondary | ICD-10-CM | POA: Diagnosis not present

## 2023-02-27 DIAGNOSIS — Z794 Long term (current) use of insulin: Secondary | ICD-10-CM | POA: Diagnosis not present

## 2023-02-27 DIAGNOSIS — Z7902 Long term (current) use of antithrombotics/antiplatelets: Secondary | ICD-10-CM | POA: Diagnosis not present

## 2023-02-27 DIAGNOSIS — M810 Age-related osteoporosis without current pathological fracture: Secondary | ICD-10-CM | POA: Diagnosis present

## 2023-02-27 DIAGNOSIS — Z8249 Family history of ischemic heart disease and other diseases of the circulatory system: Secondary | ICD-10-CM

## 2023-02-27 DIAGNOSIS — R55 Syncope and collapse: Secondary | ICD-10-CM | POA: Diagnosis present

## 2023-02-27 DIAGNOSIS — M109 Gout, unspecified: Secondary | ICD-10-CM | POA: Diagnosis present

## 2023-02-27 DIAGNOSIS — E1122 Type 2 diabetes mellitus with diabetic chronic kidney disease: Secondary | ICD-10-CM | POA: Diagnosis present

## 2023-02-27 DIAGNOSIS — I25110 Atherosclerotic heart disease of native coronary artery with unstable angina pectoris: Secondary | ICD-10-CM | POA: Diagnosis not present

## 2023-02-27 DIAGNOSIS — R64 Cachexia: Secondary | ICD-10-CM | POA: Diagnosis present

## 2023-02-27 DIAGNOSIS — E118 Type 2 diabetes mellitus with unspecified complications: Secondary | ICD-10-CM | POA: Diagnosis not present

## 2023-02-27 DIAGNOSIS — I259 Chronic ischemic heart disease, unspecified: Secondary | ICD-10-CM | POA: Diagnosis not present

## 2023-02-27 DIAGNOSIS — Z880 Allergy status to penicillin: Secondary | ICD-10-CM

## 2023-02-27 DIAGNOSIS — Z881 Allergy status to other antibiotic agents status: Secondary | ICD-10-CM | POA: Diagnosis not present

## 2023-02-27 DIAGNOSIS — Z9049 Acquired absence of other specified parts of digestive tract: Secondary | ICD-10-CM

## 2023-02-27 DIAGNOSIS — I959 Hypotension, unspecified: Secondary | ICD-10-CM | POA: Diagnosis not present

## 2023-02-27 DIAGNOSIS — R7989 Other specified abnormal findings of blood chemistry: Secondary | ICD-10-CM | POA: Diagnosis not present

## 2023-02-27 DIAGNOSIS — R0789 Other chest pain: Secondary | ICD-10-CM | POA: Diagnosis not present

## 2023-02-27 DIAGNOSIS — I252 Old myocardial infarction: Secondary | ICD-10-CM | POA: Diagnosis not present

## 2023-02-27 DIAGNOSIS — J849 Interstitial pulmonary disease, unspecified: Secondary | ICD-10-CM | POA: Diagnosis present

## 2023-02-27 DIAGNOSIS — S42202A Unspecified fracture of upper end of left humerus, initial encounter for closed fracture: Secondary | ICD-10-CM | POA: Diagnosis not present

## 2023-02-27 DIAGNOSIS — I2510 Atherosclerotic heart disease of native coronary artery without angina pectoris: Secondary | ICD-10-CM | POA: Diagnosis present

## 2023-02-27 DIAGNOSIS — R918 Other nonspecific abnormal finding of lung field: Secondary | ICD-10-CM | POA: Diagnosis not present

## 2023-02-27 DIAGNOSIS — I3481 Nonrheumatic mitral (valve) annulus calcification: Secondary | ICD-10-CM | POA: Diagnosis not present

## 2023-02-27 DIAGNOSIS — Z9660 Presence of unspecified orthopedic joint implant: Secondary | ICD-10-CM | POA: Diagnosis present

## 2023-02-27 LAB — CBC WITH DIFFERENTIAL/PLATELET
Abs Immature Granulocytes: 0.03 10*3/uL (ref 0.00–0.07)
Basophils Absolute: 0 10*3/uL (ref 0.0–0.1)
Basophils Relative: 1 %
Eosinophils Absolute: 0.1 10*3/uL (ref 0.0–0.5)
Eosinophils Relative: 1 %
HCT: 42 % (ref 39.0–52.0)
Hemoglobin: 13.7 g/dL (ref 13.0–17.0)
Immature Granulocytes: 1 %
Lymphocytes Relative: 24 %
Lymphs Abs: 1.4 10*3/uL (ref 0.7–4.0)
MCH: 33.3 pg (ref 26.0–34.0)
MCHC: 32.6 g/dL (ref 30.0–36.0)
MCV: 102.2 fL — ABNORMAL HIGH (ref 80.0–100.0)
Monocytes Absolute: 0.6 10*3/uL (ref 0.1–1.0)
Monocytes Relative: 11 %
Neutro Abs: 3.6 10*3/uL (ref 1.7–7.7)
Neutrophils Relative %: 62 %
Platelets: 280 10*3/uL (ref 150–400)
RBC: 4.11 MIL/uL — ABNORMAL LOW (ref 4.22–5.81)
RDW: 13.2 % (ref 11.5–15.5)
WBC: 5.7 10*3/uL (ref 4.0–10.5)
nRBC: 0 % (ref 0.0–0.2)

## 2023-02-27 LAB — BASIC METABOLIC PANEL
Anion gap: 13 (ref 5–15)
BUN: 28 mg/dL — ABNORMAL HIGH (ref 8–23)
CO2: 27 mmol/L (ref 22–32)
Calcium: 9.6 mg/dL (ref 8.9–10.3)
Chloride: 101 mmol/L (ref 98–111)
Creatinine, Ser: 1.68 mg/dL — ABNORMAL HIGH (ref 0.61–1.24)
GFR, Estimated: 40 mL/min — ABNORMAL LOW (ref >60)
Glucose, Bld: 122 mg/dL — ABNORMAL HIGH (ref 70–99)
Potassium: 4.4 mmol/L (ref 3.5–5.1)
Sodium: 141 mmol/L (ref 135–145)

## 2023-02-27 LAB — BRAIN NATRIURETIC PEPTIDE: B Natriuretic Peptide: 354.9 pg/mL — ABNORMAL HIGH (ref 0.0–100.0)

## 2023-02-27 LAB — HEPARIN LEVEL (UNFRACTIONATED): Heparin Unfractionated: 0.7 [IU]/mL (ref 0.30–0.70)

## 2023-02-27 LAB — TROPONIN I (HIGH SENSITIVITY)
Troponin I (High Sensitivity): 38 ng/L — ABNORMAL HIGH (ref <18)
Troponin I (High Sensitivity): 25 ng/L — ABNORMAL HIGH (ref <18)

## 2023-02-27 MED ORDER — HEPARIN (PORCINE) 25000 UT/250ML-% IV SOLN
650.0000 [IU]/h | INTRAVENOUS | Status: AC
  Administered 2023-02-27: 700 [IU]/h via INTRAVENOUS
  Administered 2023-03-01: 650 [IU]/h via INTRAVENOUS
  Filled 2023-02-27: qty 250

## 2023-02-27 MED ORDER — IPRATROPIUM BROMIDE 0.06 % NA SOLN
2.0000 | Freq: Three times a day (TID) | NASAL | Status: DC | PRN
  Filled 2023-02-27: qty 15

## 2023-02-27 MED ORDER — NITROGLYCERIN 0.4 MG SL SUBL: 0.4000 mg | SUBLINGUAL_TABLET | SUBLINGUAL | Status: DC | PRN

## 2023-02-27 MED ORDER — CLOPIDOGREL BISULFATE 75 MG PO TABS
75.0000 mg | ORAL_TABLET | Freq: Every day | ORAL | Status: DC
  Administered 2023-02-27: 75 mg via ORAL
  Filled 2023-02-27: qty 1

## 2023-02-27 MED ORDER — ASPIRIN 81 MG PO CHEW
324.0000 mg | CHEWABLE_TABLET | Freq: Once | ORAL | Status: DC
  Filled 2023-02-27: qty 4

## 2023-02-27 MED ORDER — ASPIRIN 81 MG PO TBEC: 81.0000 mg | DELAYED_RELEASE_TABLET | Freq: Every day | ORAL | Status: DC

## 2023-02-27 MED ORDER — ACETAMINOPHEN 325 MG PO TABS: 650.0000 mg | ORAL_TABLET | Freq: Four times a day (QID) | ORAL | Status: DC | PRN

## 2023-02-27 MED ORDER — HEPARIN BOLUS VIA INFUSION
3500.0000 [IU] | Freq: Once | INTRAVENOUS | Status: AC
  Administered 2023-02-27: 3500 [IU] via INTRAVENOUS
  Filled 2023-02-27: qty 3500

## 2023-02-27 MED ORDER — FUROSEMIDE 40 MG PO TABS
80.0000 mg | ORAL_TABLET | Freq: Every day | ORAL | Status: DC
  Administered 2023-02-27 – 2023-02-28 (×2): 80 mg via ORAL
  Filled 2023-02-27: qty 2
  Filled 2023-02-27: qty 4

## 2023-02-27 MED ORDER — METOPROLOL SUCCINATE ER 25 MG PO TB24
12.5000 mg | ORAL_TABLET | Freq: Every day | ORAL | Status: DC
  Administered 2023-02-27: 12.5 mg via ORAL
  Filled 2023-02-27: qty 1

## 2023-02-27 MED ORDER — ACETAMINOPHEN 650 MG RE SUPP: 650.0000 mg | Freq: Four times a day (QID) | RECTAL | Status: DC | PRN

## 2023-02-27 MED ORDER — SODIUM CHLORIDE 0.9% FLUSH
3.0000 mL | Freq: Two times a day (BID) | INTRAVENOUS | Status: DC
  Administered 2023-02-27 – 2023-03-02 (×3): 3 mL via INTRAVENOUS

## 2023-02-27 MED ORDER — ONDANSETRON HCL 4 MG PO TABS: 4.0000 mg | ORAL_TABLET | Freq: Four times a day (QID) | ORAL | Status: DC | PRN

## 2023-02-27 MED ORDER — ONDANSETRON HCL 4 MG/2ML IJ SOLN: 4.0000 mg | Freq: Four times a day (QID) | INTRAMUSCULAR | Status: DC | PRN

## 2023-02-27 MED ORDER — SPIRONOLACTONE 12.5 MG HALF TABLET
12.5000 mg | ORAL_TABLET | Freq: Every day | ORAL | Status: DC
  Administered 2023-02-28 – 2023-03-02 (×3): 12.5 mg via ORAL
  Filled 2023-02-27 (×3): qty 1

## 2023-02-27 MED ORDER — ROSUVASTATIN CALCIUM 20 MG PO TABS
20.0000 mg | ORAL_TABLET | Freq: Every day | ORAL | Status: DC
  Administered 2023-02-27: 20 mg via ORAL
  Filled 2023-02-27: qty 1

## 2023-02-27 MED ORDER — ALBUTEROL SULFATE (2.5 MG/3ML) 0.083% IN NEBU: 2.5000 mg | INHALATION_SOLUTION | Freq: Four times a day (QID) | RESPIRATORY_TRACT | Status: DC | PRN

## 2023-02-27 NOTE — H&P (Addendum)
History and Physical    Patient: [name] Steele ZOX:[id number] DOB: 08-Mar-1941 DOA: 02/27/2023 DOS: the patient was seen and examined on 02/27/2023 PCP: [name], MD  Patient coming from: Home  Chief Complaint:  Chief Complaint  Patient presents with   Chest Pain   Near Syncope   HPI: [name] Steele is a 82 y.o. male with medical history significant of hypertension, hyperlipidemia, heart failure with reduced EF, coronary artery disease, diabetes mellitus type 2, and interstitial lung disease, and gout presents with complaints of chest pain and near syncope.  Patient reported when he woke up and had been feeling dizzy.  Reported having some pressure and burning discomfort in the center of his chest while putting his clothes on.  His wife gave him a nitroglycerin which seemed to help some, but after bending down and coming back up felt lightheaded.  He denies having any loss of consciousness.  She makes note that she had given her husband nitroglycerin approximately 3 times x 5 minutes apart for her symptoms.  Patient noted pain went away, but he felt very tired thereafter.  Review of records make note that he had been hospitalized back in May of this year with complaints of chest pain and was found to be in acute on chronic systolic congestive heart failure treated with IV Lasix.  He underwent heart cath which showed critical left main disease of 95% severe proximal and mid LAD stenosis of 90%, severe obtuse marginal stenosis of 95%, severe segmental LV systolic dysfunction with LVEF of 30%.  He had been placed on intra-aortic balloon pump.  Cardiothoracic surgery have been consulted, ultimately family decided against any surgical intervention and stenting was thought to be high risk for which patient decided on medical management at that time.   In the emergency department patient was noted to be afebrile with pulse 54-96, respiration 14-25, and all other vital signs maintained.  Labs  noted BMP 354.9, high-sensitivity troponins 38->25, BUN 28, and creatinine 1.68.  Cardiology had been formally consulted.  Patient had been given full dose aspirin, Plavix 75 mg, and started on a heparin drip.   Review of Systems: As mentioned in the history of present illness. All other systems reviewed and are negative. Past Medical History:  Diagnosis Date   Arthritis    CAD (coronary artery disease) 10/05/2022   Cath 09/2022: 3v CAD, critical LM stenosis - Pt opted for med Rx only>>DNR   Chronic kidney disease, stage 3a (HCC)    Chronic urinary tract infection    Diabetes mellitus without complication (HCC)    Gout    Heart murmur    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   S/p non-STEMI 09/2022>> three-vessel and critical left main stenosis; patient opted for medical management // TTE 09/2022: EF 20-25, GR 2 DD, RVSP 59.1, trivial MR, mild-moderate TR, mild aortic stenosis, mean gradient 10, V-max 206 cm/s, RAP 15   History of non-ST elevation myocardial infarction (NSTEMI) 10/05/2022   Hypertension    ILD (interstitial lung disease) (HCC)    by CT 01/2021   Ischemic cardiomyopathy 10/10/2022   Mild aortic stenosis    Mild dilation of ascending aorta (HCC)    Osteoporosis    Past Surgical History:  Procedure Laterality Date   AORTIC ARCH ANGIOGRAPHY N/A 10/05/2022   Procedure: AORTIC ARCH ANGIOGRAPHY;  Surgeon: Tonny Bollman, MD;  Location: [facility] INVASIVE CV LAB;  Service: Cardiovascular;  Laterality: N/A;   CHOLECYSTECTOMY N/A 07/04/2018   Procedure: LAPAROSCOPIC  CHOLECYSTECTOMY WITH INTRAOPERATIVE CHOLANGIOGRAM;  Surgeon: Abigail Miyamoto, MD;  Location: [facility] ORS;  Service: General;  Laterality: N/A;   ERCP N/A 07/05/2018   Procedure: ENDOSCOPIC RETROGRADE CHOLANGIOPANCREATOGRAPHY (ERCP);  Surgeon: Vida Rigger, MD;  Location: [facility] ENDOSCOPY;  Service: Endoscopy;  Laterality: N/A;   IABP INSERTION N/A 10/05/2022   Procedure: IABP Insertion;  Surgeon: Tonny Bollman, MD;  Location: [facility] INVASIVE CV LAB;  Service: Cardiovascular;  Laterality: N/A;   INGUINAL HERNIA REPAIR Right 07/31/2022   Procedure: OPEN RIGHT INGUINAL HERNIA REPAIR WITH MESH;  Surgeon: Berna Bue, MD;  Location: [facility] ORS;  Service: General;  Laterality: Right;   JOINT REPLACEMENT Bilateral    REMOVAL OF STONES  07/05/2018   Procedure: REMOVAL OF STONES;  Surgeon: Vida Rigger, MD;  Location: [facility] ENDOSCOPY;  Service: Endoscopy;;   RIGHT/LEFT HEART CATH AND CORONARY ANGIOGRAPHY N/A 10/05/2022   Procedure: RIGHT/LEFT HEART CATH AND CORONARY ANGIOGRAPHY;  Surgeon: Tonny Bollman, MD;  Location: [facility] INVASIVE CV LAB;  Service: Cardiovascular;  Laterality: N/A;   SPHINCTEROTOMY  07/05/2018   Procedure: SPHINCTEROTOMY;  Surgeon: Vida Rigger, MD;  Location: [facility] ENDOSCOPY;  Service: Endoscopy;;   Social History:  reports that he quit smoking about 36 years ago. His smoking use included cigarettes. He started smoking about 67 years ago. He has a 31 pack-year smoking history. He has never used smokeless tobacco. He reports current alcohol use. He reports that he does not use drugs.  Allergies  Allergen Reactions   Erythromycin Other (See Comments)    Severe Abdominal Pain   Penicillins Hives    Family History  Problem Relation Age of Onset   Heart disease Sister     Prior to Admission medications   Medication Sig Start Date End Date Taking? Authorizing Provider  aspirin EC 81 MG tablet Take 1 tablet (81 mg total) by mouth daily. Swallow whole. 04/14/21   Jake Bathe, MD  clopidogrel (PLAVIX) 75 MG tablet Take 1 tablet (75 mg total) by mouth daily. 11/09/22 11/09/23  Jake Bathe, MD  CRANBERRY PO Take 1 capsule by mouth daily.    [provider]  furosemide (LASIX) 40 MG tablet Take 1 tablet (40 mg total) by mouth as needed (Take as directed by your sliding scale instructions noted on discharge summary.). 10/16/22 10/16/23  Abagail Kitchens, PA-C  furosemide (LASIX) 80 MG tablet  Take 1 tablet (80 mg total) by mouth daily at 12 noon. 11/11/22   Jake Bathe, MD  ipratropium (ATROVENT) 0.06 % nasal spray Place 2 sprays into both nostrils 3 (three) times daily as needed for rhinitis. 12/05/21   [provider]  metFORMIN (GLUCOPHAGE-XR) 750 MG 24 hr tablet Take 750 mg by mouth daily after breakfast. 06/05/18   [provider]  metoprolol succinate (TOPROL-XL) 25 MG 24 hr tablet Take 0.5 tablets (12.5 mg total) by mouth daily. 11/02/22   Dyann Kief, PA-C  nitroGLYCERIN (NITROSTAT) 0.4 MG SL tablet Place 1 tablet (0.4 mg total) under the tongue every 5 (five) minutes x 3 doses as needed for chest pain. 10/16/22   Abagail Kitchens, PA-C  ranolazine (RANEXA) 500 MG 12 hr tablet Take 1 tablet (500 mg total) by mouth 2 (two) times daily. 11/09/22   Jake Bathe, MD  rosuvastatin (CRESTOR) 20 MG tablet Take 1 tablet (20 mg total) by mouth daily. 10/10/22 10/10/23  Tereso Newcomer T, PA-C  spironolactone (ALDACTONE) 25 MG tablet Take 0.5 tablets (12.5 mg total) by mouth daily. 11/11/22  02/09/23  Jake Bathe, MD    Physical Exam: Vitals:   02/27/23 1530 02/27/23 1600 02/27/23 1630 02/27/23 1642  BP: (!) 117/99 127/67 110/67   Pulse: 96 (!) 54 62   Resp: 14 (!) 21 14   Temp:    (!) 97.5 F (36.4 C)  TempSrc:    Oral  SpO2: 100% 100% 100%   Weight:      Height:         Constitutional: Thin elderly male who appears to be in no acute distress at this time. Eyes: PERRL, lids and conjunctivae normal ENMT: Mucous membranes are moist.  Poor dentition with missing teeth.  Patient is very hard of hearing.  Neck: normal, supple, no JVD appreciated. Respiratory: clear to auscultation bilaterally, no wheezing, no crackles. Normal respiratory effort. No accessory muscle use.  Cardiovascular: Regular rate and rhythm, no murmurs / rubs / gallops. No extremity edema.  Decreased pedal pulses. Abdomen: no tenderness, no masses palpated.   Bowel sounds positive.   Musculoskeletal: no clubbing / cyanosis. No joint deformity upper and lower extremities. Good ROM, no contractures. Normal muscle tone.  Skin: no rashes, lesions, ulcers. No induration Neurologic: CN 2-12 grossly intact.  Global weakness strength 4/5. Psychiatric: Normal judgment and insight. Alert and oriented x 3. Normal mood.   Data Reviewed:  EKG reveals sinus rhythm with T wave inversions seen in the anterior leads. Reviewed labs, imaging, and pertinent records as documented. Assessment and Plan:  Elevated troponin Coronary artery disease with angina Patient has known critical left main disease from previous cardiac catheterization in May of this year.  It was thought that he was too high risk/decline CABG due to age.  PCI had not been attempted and family confirmed plan was for medical management..  High-sensitivity troponin initially 38 with repeat check trending down at 25.  Patient had been given full dose aspirin and recommended to resume home medications. -Admit to a cardiac telemetry bed -Continue heparin drip per pharmacy -Continue metoprolol, aspirin, Plavix, and Crestor -Appreciate cardiology consultative service, will continue medical management for now.  Near syncope Patient had reported feeling lightheaded and dizzy especially after trying to stand and after bending over and sitting back up.  Denied any loss of consciousness. -Check orthostatic vital signs -PT consulted to evaluate  Ischemic cardiomyopathy Heart failure with reduced ejection fraction Chronic.  Patient clinically did not appear to be acutely fluid overloaded at this time.  BNP was noted to be 354.9 and improved from prior.  Last echocardiogram noted EF to be 20 to 25% with grade 2 diastolic dysfunction back in 09/2022. -Strict intake and output -Continue furosemide 80 mg daily and spironolactone  Chronic kidney disease stage IIIb Creatinine 1.68 with BUN 28.  Creatinine appears to patient's  baseline. -Continue to monitor  Diabetes mellitus type 2, without long-term use of insulin On admission glucose noted to be 122.  Last available hemoglobin A1c was 5.3 on 07/29/2022. -Hold metformin  Hyperlipidemia -Continue Crestor  DVT prophylaxis: Heparin Advance Care Planning:   Code Status: Do not attempt resuscitation (DNR) PRE-ARREST INTERVENTIONS DESIRED  Consults: Cardiology  Family Communication: Wife and son updated at bedside  Severity of Illness: The appropriate patient status for this patient is INPATIENT. Inpatient status is judged to be reasonable and necessary in order to provide the required intensity of service to ensure the patient's safety. The patient's presenting symptoms, physical exam findings, and initial radiographic and laboratory data in the context of their chronic comorbidities is  felt to place them at high risk for further clinical deterioration. Furthermore, it is not anticipated that the patient will be medically stable for discharge from the hospital within 2 midnights of admission.   * I certify that at the point of admission it is my clinical judgment that the patient will require inpatient hospital care spanning beyond 2 midnights from the point of admission due to high intensity of service, high risk for further deterioration and high frequency of surveillance required.*  Author: Clydie Braun, MD 02/27/2023 5:28 PM  For on call review [email or web address].

## 2023-02-27 NOTE — ED Provider Notes (Incomplete Revision)
[place] EMERGENCY DEPARTMENT AT [facility] Provider Note   CSN: [id number] Arrival date & time: 02/27/23  1304     History  Chief Complaint  Patient presents with  . Chest Pain  . Near Syncope    [name] Steele is a 82 y.o. male.  HPI History limited due to patient's extreme hard of hearing 82 year old male presents today with reports that he had chest pain this morning on awakening.  He took 2 of his nitroglycerin and aspirin.  He felt very lightheaded.  Pain decreased greatly.  He states it is almost gone.  He describes it as severe initially.     Home Medications Prior to Admission medications   Medication Sig Start Date End Date Taking? Authorizing Provider  aspirin EC 81 MG tablet Take 1 tablet (81 mg total) by mouth daily. Swallow whole. 04/14/21   Jake Bathe, MD  clopidogrel (PLAVIX) 75 MG tablet Take 1 tablet (75 mg total) by mouth daily. 11/09/22 11/09/23  Jake Bathe, MD  CRANBERRY PO Take 1 capsule by mouth daily.    [provider]  furosemide (LASIX) 40 MG tablet Take 1 tablet (40 mg total) by mouth as needed (Take as directed by your sliding scale instructions noted on discharge summary.). 10/16/22 10/16/23  Abagail Kitchens, PA-C  furosemide (LASIX) 80 MG tablet Take 1 tablet (80 mg total) by mouth daily at 12 noon. 11/11/22   Jake Bathe, MD  ipratropium (ATROVENT) 0.06 % nasal spray Place 2 sprays into both nostrils 3 (three) times daily as needed for rhinitis. 12/05/21   [provider]  metFORMIN (GLUCOPHAGE-XR) 750 MG 24 hr tablet Take 750 mg by mouth daily after breakfast. 06/05/18   [provider]  metoprolol succinate (TOPROL-XL) 25 MG 24 hr tablet Take 0.5 tablets (12.5 mg total) by mouth daily. 11/02/22   Dyann Kief, PA-C  nitroGLYCERIN (NITROSTAT) 0.4 MG SL tablet Place 1 tablet (0.4 mg total) under the tongue every 5 (five) minutes x 3 doses as needed for chest pain. 10/16/22   Abagail Kitchens, PA-C   ranolazine (RANEXA) 500 MG 12 hr tablet Take 1 tablet (500 mg total) by mouth 2 (two) times daily. 11/09/22   Jake Bathe, MD  rosuvastatin (CRESTOR) 20 MG tablet Take 1 tablet (20 mg total) by mouth daily. 10/10/22 10/10/23  Tereso Newcomer T, PA-C  spironolactone (ALDACTONE) 25 MG tablet Take 0.5 tablets (12.5 mg total) by mouth daily. 11/11/22 02/09/23  Jake Bathe, MD      Allergies    Erythromycin and Penicillins    Review of Systems   Review of Systems  Physical Exam Updated Vital Signs BP (!) 117/99   Pulse 96   Temp 97.8 F (36.6 C) (Oral)   Resp 14   Ht 1.778 m (5\' 10" )   Wt 58.5 kg   SpO2 100%   BMI 18.51 kg/m  Physical Exam Vitals and nursing note reviewed.  HENT:     Head: Normocephalic.  Eyes:     Pupils: Pupils are equal, round, and reactive to light.  Cardiovascular:     Rate and Rhythm: Normal rate and regular rhythm.     Heart sounds: Normal heart sounds.  Pulmonary:     Effort: Pulmonary effort is normal.  Abdominal:     General: Bowel sounds are normal.     Palpations: Abdomen is soft.  Musculoskeletal:        General: Normal range of motion.  Cervical back: Normal range of motion.  Skin:    General: Skin is warm.     Capillary Refill: Capillary refill takes less than 2 seconds.  Neurological:     General: No focal deficit present.     Mental Status: He is alert.    ED Results / Procedures / Treatments   Labs (all labs ordered are listed, but only abnormal results are displayed) Labs Reviewed  CBC WITH DIFFERENTIAL/PLATELET - Abnormal; Notable for the following components:      Result Value   RBC 4.11 (*)    MCV 102.2 (*)    All other components within normal limits  BASIC METABOLIC PANEL - Abnormal; Notable for the following components:   Glucose, Bld 122 (*)    BUN 28 (*)    Creatinine, Ser 1.68 (*)    GFR, Estimated 40 (*)    All other components within normal limits  BRAIN NATRIURETIC PEPTIDE - Abnormal; Notable for the  following components:   B Natriuretic Peptide 354.9 (*)    All other components within normal limits  TROPONIN I (HIGH SENSITIVITY) - Abnormal; Notable for the following components:   Troponin I (High Sensitivity) 38 (*)    All other components within normal limits  HEPARIN LEVEL (UNFRACTIONATED)  TROPONIN I (HIGH SENSITIVITY)    EKG EKG Interpretation Date/Time:  Saturday February 27 2023 14:15:41 EDT Ventricular Rate:  64 PR Interval:  242 QRS Duration:  99 QT Interval:  403 QTC Calculation: 416 R Axis:   -63  Text Interpretation: Sinus rhythm Prolonged PR interval Abnormal R-wave progression, early transition Inferior infarct, old Abnrm T, consider ischemia, anterolateral lds ?posterior MI Confirmed by Margarita Grizzle [phone]) on 02/27/2023 2:16:48 PM  Radiology DG Chest Portable 1 View  Result Date: 02/27/2023 CLINICAL DATA:  Provided history: Chest pain. EXAM: PORTABLE CHEST 1 VIEW COMPARISON:  Prior chest radiographs 10/13/2022 and earlier. Chest CT 04/06/2022. FINDINGS: Heart size within normal limits. Aortic atherosclerosis. Mitral annular calcifications. Emphysema and findings suspicious for fibrotic lung disease were better appreciated on the prior high-resolution chest CT of 04/06/2022. No appreciable airspace consolidation or pulmonary edema. No evidence of pleural effusion or pneumothorax. No acute osseous abnormality identified. Chronic bilateral rib fracture deformities again noted. Partially imaged chronic fracture deformity of the proximal left humerus. Spondylosis and dextrocurvature of the thoracic spine. IMPRESSION: 1. No evidence of an acute cardiopulmonary abnormality. 2. Emphysema and findings suspicious for fibrotic lung disease were better appreciated on the prior high-resolution chest CT of 04/06/2022. Emphysema (ICD10-J43.9). 3. Mitral annular calcifications. 4. Aortic Atherosclerosis (ICD10-I70.0). Electronically Signed   By: Jackey Loge D.O.   On: 02/27/2023 14:51     Procedures .Critical Care  Performed by: Margarita Grizzle, MD Authorized by: Margarita Grizzle, MD   Critical care provider statement:    Critical care time (minutes):  60   Critical care was necessary to treat or prevent imminent or life-threatening deterioration of the following conditions:  Cardiac failure   Critical care was time spent personally by me on the following activities:  Development of treatment plan with patient or surrogate, discussions with consultants, evaluation of patient's response to treatment, examination of patient, ordering and review of laboratory studies, ordering and review of radiographic studies, ordering and performing treatments and interventions, pulse oximetry, re-evaluation of patient's condition and review of old charts     Medications Ordered in ED Medications  aspirin chewable tablet 324 mg (324 mg Oral Not Given 02/27/23 1511)  aspirin EC tablet 81 mg (  has no administration in time range)  clopidogrel (PLAVIX) tablet 75 mg (75 mg Oral Given 02/27/23 1525)  rosuvastatin (CRESTOR) tablet 20 mg (20 mg Oral Given 02/27/23 1526)  metoprolol succinate (TOPROL-XL) 24 hr tablet 12.5 mg (12.5 mg Oral Given 02/27/23 1525)  furosemide (LASIX) tablet 80 mg (80 mg Oral Given 02/27/23 1526)  nitroGLYCERIN (NITROSTAT) SL tablet 0.4 mg (has no administration in time range)  heparin ADULT infusion 100 units/mL (25000 units/262mL) (700 Units/hr Intravenous New Bag/Given 02/27/23 1531)  heparin bolus via infusion 3,500 Units (3,500 Units Intravenous Bolus from Bag 02/27/23 1531)    ED Course/ Medical Decision Making/ A&P Clinical Course as of 02/27/23 1547  Sat Feb 27, 2023  1420 Chest x-Margarite Vessel reviewed interpreted no evidence of acute abnormality noted on my interpretation awaiting [DR]  1420  radiology interpretation [DR]  1420 CBC reviewed interpreted within normal limits  [DR]  1420 Basic metabolic panel reviewed interpreted significant for BUN creatinine at 1.68  slightly elevated from baseline of 1.5 [DR]  1421 BNP increased at 354 Troponin 38 [DR]  1543 Recevied sign out from Dr. Rosalia Hammers, presenting with chest pain, pending labs. Symptoms improved with NTG. Cardiology going to come see patient. Will need admission  [WS]    Clinical Course User Index [DR] Margarita Grizzle, MD [WS] Lonell Grandchild, MD                                 Medical Decision Making Amount and/or Complexity of Data Reviewed Labs: ordered.  Risk OTC drugs. Prescription drug management.   82 year old male with known coronary artery disease, status post left heart catheterization May 2024 that demonstrated critical LM stenosis and severe LAD and OM stenosis with echo with EF 20 to 25%.  They report in cardiology note that CABG was considered that the patient was concerned he would not recover from surgery and opted for medical management only.  His CODE STATUS was changed to DNR.  Today he had significant chest pain.  It did resolve with his nitro and is decreased but he continues to have ST depression in V2 and V3 concerning for posterior MI. Care discussed with Dr. Wyline Mood.  She will see in consultation. We will begin aspirin and heparin per pharmacy consult.  Plan admission Signout to Dr. Suezanne Jacquet, who will dispo after cardiology sees       Final Clinical Impression(s) / ED Diagnoses Final diagnoses:  Chest pain due to myocardial ischemia, unspecified ischemic chest pain type    Rx / DC Orders ED Discharge Orders     None         Margarita Grizzle, MD 02/27/23 1547

## 2023-02-27 NOTE — ED Triage Notes (Signed)
Patient presents to ed via [facility] states he  woke up this am with c/o chest pressure took 2 of his NTG and asa324 mg got up after taking NTG and felt very light headed. Felt very tired. Now denies chest pressure and states he feels better.

## 2023-02-27 NOTE — Consult Note (Signed)
Cardiology Consultation   Patient ID: [name] Steele MRN: [id number]; DOB: 1941/03/19  Admit date: 02/27/2023 Date of Consult: 02/27/2023  PCP:  [name], MD   [facility] Providers Cardiologist:  Donato Schultz, MD        Patient Profile:   [name] Steele is a 82 y.o. male with a hx of ILD, NSTEMI 2024,  critical LM stenosis and severe p LAD and OM stenoses, EF 30%, required IABP. Formal echo 10/05/22 showed EF 20-25%. Declined CABG and palliative was recommended who is being seen 02/27/2023 for the evaluation of CP at the request of Dr. Rosalia Hammers.  History of Present Illness:   [name] Steele was admitted 10/05/22 after NSTEMI and new HFrEF with LVEF of 20-25%. Cath that admission showed 95% L main, 90% prox and mid LAD. IABP was placed. Discussions were had for CABG vs high risk PCI vs medical management and it was decided to continue medical management due to surgical and PCI recovery concerns. He was diuresed and discharged 10/10/22 on lasix 40mg  daily, aspirin, 81mg  daily and coreg 3.125mg  BID. Therapy was limited by blood pressure. Palliative care was consulted during previous admission to discuss goals of care. His DNR status was confirmed.  This is per his previous DC summary. Family declined outpatient palliative care. He is coming back with CP probably today as well as some shortness of breath.  His wife was concerned and gave him nitroglycerin.  He was then brought to the emergency room by EMS.  He states as he was in EMS on his way to the emergency room his chest pain started to improve.  Here his EKG showed deep TWI anteriorly; which she had priorly but this is more pronounced. Trop x1 38. BNP is mild 354; was 2600 in June. He is currently HDS.  He notes that after receiving nitroglycerin today from his wife that his chest pain has improved.  His family understands that he is high risk for CABG as well as PCI based on his prior admission.  Otherwise he is quite deconditioned  at home.  But he does continue to cook for his wife , because this is his passion.    Past Medical History:  Diagnosis Date   Arthritis    CAD (coronary artery disease) 10/05/2022   Cath 09/2022: 3v CAD, critical LM stenosis - Pt opted for med Rx only>>DNR   Chronic kidney disease, stage 3a (HCC)    Chronic urinary tract infection    Diabetes mellitus without complication (HCC)    Gout    Heart murmur    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   S/p non-STEMI 09/2022>> three-vessel and critical left main stenosis; patient opted for medical management // TTE 09/2022: EF 20-25, GR 2 DD, RVSP 59.1, trivial MR, mild-moderate TR, mild aortic stenosis, mean gradient 10, V-max 206 cm/s, RAP 15   History of non-ST elevation myocardial infarction (NSTEMI) 10/05/2022   Hypertension    ILD (interstitial lung disease) (HCC)    by CT 01/2021   Ischemic cardiomyopathy 10/10/2022   Mild aortic stenosis    Mild dilation of ascending aorta (HCC)    Osteoporosis     Past Surgical History:  Procedure Laterality Date   AORTIC ARCH ANGIOGRAPHY N/A 10/05/2022   Procedure: AORTIC ARCH ANGIOGRAPHY;  Surgeon: Tonny Bollman, MD;  Location: [facility] INVASIVE CV LAB;  Service: Cardiovascular;  Laterality: N/A;   CHOLECYSTECTOMY N/A 07/04/2018   Procedure: LAPAROSCOPIC CHOLECYSTECTOMY WITH INTRAOPERATIVE CHOLANGIOGRAM;  Surgeon: Magnus Ivan,  Riley Lam, MD;  Location: [facility] ORS;  Service: General;  Laterality: N/A;   ERCP N/A 07/05/2018   Procedure: ENDOSCOPIC RETROGRADE CHOLANGIOPANCREATOGRAPHY (ERCP);  Surgeon: Vida Rigger, MD;  Location: [facility] ENDOSCOPY;  Service: Endoscopy;  Laterality: N/A;   IABP INSERTION N/A 10/05/2022   Procedure: IABP Insertion;  Surgeon: Tonny Bollman, MD;  Location: [facility] INVASIVE CV LAB;  Service: Cardiovascular;  Laterality: N/A;   INGUINAL HERNIA REPAIR Right 07/31/2022   Procedure: OPEN RIGHT INGUINAL HERNIA REPAIR WITH MESH;  Surgeon: Berna Bue, MD;  Location: [facility]  ORS;  Service: General;  Laterality: Right;   JOINT REPLACEMENT Bilateral    REMOVAL OF STONES  07/05/2018   Procedure: REMOVAL OF STONES;  Surgeon: Vida Rigger, MD;  Location: [facility] ENDOSCOPY;  Service: Endoscopy;;   RIGHT/LEFT HEART CATH AND CORONARY ANGIOGRAPHY N/A 10/05/2022   Procedure: RIGHT/LEFT HEART CATH AND CORONARY ANGIOGRAPHY;  Surgeon: Tonny Bollman, MD;  Location: [facility] INVASIVE CV LAB;  Service: Cardiovascular;  Laterality: N/A;   SPHINCTEROTOMY  07/05/2018   Procedure: SPHINCTEROTOMY;  Surgeon: Vida Rigger, MD;  Location: [facility] ENDOSCOPY;  Service: Endoscopy;;     Home Medications:  Prior to Admission medications   Medication Sig Start Date End Date Taking? Authorizing Provider  aspirin EC 81 MG tablet Take 1 tablet (81 mg total) by mouth daily. Swallow whole. 04/14/21   Jake Bathe, MD  clopidogrel (PLAVIX) 75 MG tablet Take 1 tablet (75 mg total) by mouth daily. 11/09/22 11/09/23  Jake Bathe, MD  CRANBERRY PO Take 1 capsule by mouth daily.    [provider]  furosemide (LASIX) 40 MG tablet Take 1 tablet (40 mg total) by mouth as needed (Take as directed by your sliding scale instructions noted on discharge summary.). 10/16/22 10/16/23  Abagail Kitchens, PA-C  furosemide (LASIX) 80 MG tablet Take 1 tablet (80 mg total) by mouth daily at 12 noon. 11/11/22   Jake Bathe, MD  ipratropium (ATROVENT) 0.06 % nasal spray Place 2 sprays into both nostrils 3 (three) times daily as needed for rhinitis. 12/05/21   [provider]  metFORMIN (GLUCOPHAGE-XR) 750 MG 24 hr tablet Take 750 mg by mouth daily after breakfast. 06/05/18   [provider]  metoprolol succinate (TOPROL-XL) 25 MG 24 hr tablet Take 0.5 tablets (12.5 mg total) by mouth daily. 11/02/22   Dyann Kief, PA-C  nitroGLYCERIN (NITROSTAT) 0.4 MG SL tablet Place 1 tablet (0.4 mg total) under the tongue every 5 (five) minutes x 3 doses as needed for chest pain. 10/16/22   Abagail Kitchens, PA-C  ranolazine  (RANEXA) 500 MG 12 hr tablet Take 1 tablet (500 mg total) by mouth 2 (two) times daily. 11/09/22   Jake Bathe, MD  rosuvastatin (CRESTOR) 20 MG tablet Take 1 tablet (20 mg total) by mouth daily. 10/10/22 10/10/23  Tereso Newcomer T, PA-C  spironolactone (ALDACTONE) 25 MG tablet Take 0.5 tablets (12.5 mg total) by mouth daily. 11/11/22 02/09/23  Jake Bathe, MD    Inpatient Medications: Scheduled Meds:  Continuous Infusions:  PRN Meds:   Allergies:    Allergies  Allergen Reactions   Erythromycin Other (See Comments)    Severe Abdominal Pain   Penicillins Hives    Social History:   Social History   Socioeconomic History   Marital status: Married    Spouse name: Alice   Number of children: 2   Years of education: Not on file   Highest education level: Not on file  Occupational  History   Occupation: Retired  Tobacco Use   Smoking status: Former    Current packs/day: 0.00    Average packs/day: 1 pack/day for 31.0 years (31.0 ttl pk-yrs)    Types: Cigarettes    Start date: 69    Quit date: 1988    Years since quitting: 36.8   Smokeless tobacco: Never  Vaping Use   Vaping status: Never Used  Substance and Sexual Activity   Alcohol use: Yes    Alcohol/week: 0.0 standard drinks of alcohol    Comment: social   Drug use: No   Sexual activity: Not Currently  Other Topics Concern   Not on file  Social History Narrative   Not on file   Social Determinants of Health   Financial Resource Strain: Low Risk  (10/07/2022)   Overall Financial Resource Strain (CARDIA)    Difficulty of Paying Living Expenses: Not very hard  Food Insecurity: No Food Insecurity (10/13/2022)   Hunger Vital Sign    Worried About Running Out of Food in the Last Year: Never true    Ran Out of Food in the Last Year: Never true  Transportation Needs: No Transportation Needs (10/28/2022)   PRAPARE - Administrator, Civil Service (Medical): No    Lack of Transportation (Non-Medical): No   Physical Activity: Not on file  Stress: Not on file  Social Connections: Not on file  Intimate Partner Violence: Not At Risk (10/13/2022)   Humiliation, Afraid, Rape, and Kick questionnaire    Fear of Current or Ex-Partner: No    Emotionally Abused: No    Physically Abused: No    Sexually Abused: No    Family History:    Family History  Problem Relation Age of Onset   Heart disease Sister      ROS:  Please see the history of present illness.   All other ROS reviewed and negative.     Physical Exam/Data:   Vitals:   02/27/23 1313 02/27/23 1314 02/27/23 1400 02/27/23 1415  BP: 114/74  124/79   Pulse: 85  85   Resp: 18   18  Temp: 97.8 F (36.6 C)     TempSrc: Oral     SpO2: 100%   100%  Weight:  58.5 kg    Height:  5\' 10"  (1.778 m)     No intake or output data in the 24 hours ending 02/27/23 1428    02/27/2023    1:14 PM 02/03/2023    1:48 PM 11/02/2022   10:47 AM  Last 3 Weights  Weight (lbs) 129 lb 136 lb 3.2 oz 147 lb  Weight (kg) 58.514 kg 61.78 kg 66.679 kg     Body mass index is 18.51 kg/m.  General:  Well nourished, well developed, in no acute distress HEENT: normal Neck: no JVD Vascular: No carotid bruits; Distal pulses 2+ bilaterally Cardiac:  normal S1, S2; RRR; no murmur  Lungs:  clear to auscultation bilaterally, no wheezing, rhonchi or rales  Abd: soft, nontender, no hepatomegaly  Ext: no edema Musculoskeletal:  No deformities, BUE and BLE strength normal and equal Skin: warm and dry  Neuro:  CNs 2-12 intact, no focal abnormalities noted Psych:  Normal affect   EKG:  The EKG was personally reviewed and demonstrates:  sinus rhythm, TWI anteirorly. Seen in 11-26-22, not as pronounced   Telemetry:  Telemetry was personally reviewed and demonstrates:  NSR  Relevant CV Studies: LHC 10/05/2022 1.  Critical left main disease of  95% 2.  Severe proximal and mid LAD stenoses of 90% 3.  Severe obtuse marginal stenosis of 95% 4.  Nondominant RCA, not  visualized 5.  Severe segmental LV systolic dysfunction with LVEF estimated at 30% 6.  Mild aortic insufficiency assessed by supravalvular aortogram 7.  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  Successful intra-aortic balloon pump insertion   Recommendations: IV heparin, continue IABP 1:1, cardiac surgical consultation.  Difficult situation in this elderly patient, will need heart team approach to his care.  TTE 10/05/2022  1. Presence of apical swirling/sludge with definity contrast without  evidence of formed thrombus. Left ventricular ejection fraction, by  estimation, is 20 to 25%. The left ventricle has severely decreased  function. The left ventricle demonstrates global   hypokinesis. Left ventricular diastolic parameters are consistent with  Grade II diastolic dysfunction (pseudonormalization).   2. Right ventricular systolic function is normal. The right ventricular  size is normal. There is moderately elevated pulmonary artery systolic  pressure. The estimated right ventricular systolic pressure is 59.1 mmHg.   3. The mitral valve is grossly normal. Trivial mitral valve  regurgitation.   4. Tricuspid valve regurgitation is mild to moderate.   5. The aortic valve is calcified. There is moderate calcification of the  aortic valve. There is moderate thickening of the aortic valve. Aortic  valve regurgitation is mild. Mild aortic valve stenosis. Aortic valve  area, by VTI measures 1.11 cm. Aortic  valve mean gradient measures 10.0 mmHg. Aortic valve Vmax measures 2.06  m/s.   6. The inferior vena cava is dilated in size with <50% respiratory  variability, suggesting right atrial pressure of 15 mmHg.   Comparison(s): Changes from prior study are noted.   Conclusion(s)/Recommendation(s): Severe LV dysfunction, with severe  hypokinesis to akinesis worst at the apex, present throughout LAD/LCx  territory. Basal to mid  inferior/inferolateral walls with preserved  function.      Laboratory Data:  High Sensitivity Troponin:   Recent Labs  Lab 02/27/23 1323  TROPONINIHS 38*     Chemistry Recent Labs  Lab 02/27/23 1323  NA 141  K 4.4  CL 101  CO2 27  GLUCOSE 122*  BUN 28*  CREATININE 1.68*  CALCIUM 9.6  GFRNONAA 40*  ANIONGAP 13    No results for input(s): "PROT", "ALBUMIN", "AST", "ALT", "ALKPHOS", "BILITOT" in the last 168 hours. Lipids No results for input(s): "CHOL", "TRIG", "HDL", "LABVLDL", "LDLCALC", "CHOLHDL" in the last 168 hours.  Hematology Recent Labs  Lab 02/27/23 1323  WBC 5.7  RBC 4.11*  HGB 13.7  HCT 42.0  MCV 102.2*  MCH 33.3  MCHC 32.6  RDW 13.2  PLT 280   Thyroid No results for input(s): "TSH", "FREET4" in the last 168 hours.  BNP Recent Labs  Lab 02/27/23 1323  BNP 354.9*    DDimer No results for input(s): "DDIMER" in the last 168 hours.   Radiology/Studies:  No results found.   Assessment and Plan:   Angina He has known critical LM and this was discussed in May. He was felt too high risk/ declined CABG with age. PCI was not attempted 2/2 recovery concerns per the documentation. He required an IABP at that time.  Palliative care was recommended. Considering this, I discussed with his family that the plan will be to medical management for ACS.  - can start heparin gtt and continue for 48 hours -Can trend his troponin to peak - aspirin load - asa 81 mg (Sun)  and plavix 75 mg daily (today) - continue crestor 20 mg daily - cont metop XL 12.5 mg daily - nitroglycerin SL PRN  Ischemic CM EF 20-25% - He reported some shortness of breath, but he is euvolemic on exam.  BNP is much improved from June. - continue lasix 80 mg daily - can continue home spironolactone 12.5 mg daily - with age and DNR status, would not be aggressive with GDMT  Risk Assessment/Risk Scores:       For questions or updates, please contact [facility] Please  consult [email or web address] for contact info under    Signed, Maisie Fus, MD  02/27/2023 2:28 PM

## 2023-02-27 NOTE — Progress Notes (Signed)
ANTICOAGULATION CONSULT NOTE   Pharmacy Consult for Heparin Indication: chest pain/ACS  Allergies  Allergen Reactions   Ak-Mycin [Erythromycin] Other (See Comments)    Severe Abdominal Pain   Penicillins Hives    Patient Measurements: Height: 5\' 10"  (177.8 cm) Weight: 58.7 kg (129 lb 6.4 oz) IBW/kg (Calculated) : 73 Heparin Dosing Weight: 58.5 kg  Vital Signs: Temp: 98.4 F (36.9 C) (10/12 2120) Temp Source: Oral (10/12 2120) BP: 145/68 (10/12 2120) Pulse Rate: 64 (10/12 2120)  Labs: Recent Labs    02/27/23 1323 02/27/23 1517 02/27/23 2307  HGB 13.7  --   --   HCT 42.0  --   --   PLT 280  --   --   HEPARINUNFRC  --   --  0.70  CREATININE 1.68*  --   --   TROPONINIHS 38* 25*  --     Estimated Creatinine Clearance: 28.1 mL/min (A) (by C-G formula based on SCr of 1.68 mg/dL (H)).   Medical History: Past Medical History:  Diagnosis Date   Arthritis    CAD (coronary artery disease) 10/05/2022   Cath 09/2022: 3v CAD, critical LM stenosis - Pt opted for med Rx only>>DNR   Chronic kidney disease, stage 3a (HCC)    Chronic urinary tract infection    Diabetes mellitus without complication (HCC)    Gout    Heart murmur    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   S/p non-STEMI 09/2022>> three-vessel and critical left main stenosis; patient opted for medical management // TTE 09/2022: EF 20-25, GR 2 DD, RVSP 59.1, trivial MR, mild-moderate TR, mild aortic stenosis, mean gradient 10, V-max 206 cm/s, RAP 15   History of non-ST elevation myocardial infarction (NSTEMI) 10/05/2022   Hypertension    ILD (interstitial lung disease) (HCC)    by CT 01/2021   Ischemic cardiomyopathy 10/10/2022   Mild aortic stenosis    Mild dilation of ascending aorta (HCC)    Osteoporosis     Medications:  Medications Prior to Admission  Medication Sig Dispense Refill Last Dose   aspirin EC 81 MG tablet Take 1 tablet (81 mg total) by mouth daily. Swallow whole.  90 tablet 3 02/27/2023   clopidogrel (PLAVIX) 75 MG tablet Take 1 tablet (75 mg total) by mouth daily. 90 tablet 3 02/26/2023   CRANBERRY PO Take 1 capsule by mouth daily.   02/26/2023   furosemide (LASIX) 40 MG tablet Take 1 tablet (40 mg total) by mouth as needed (Take as directed by your sliding scale instructions noted on discharge summary.). 90 tablet 3 NEVER   furosemide (LASIX) 80 MG tablet Take 1 tablet (80 mg total) by mouth daily at 12 noon. 90 tablet 6 02/26/2023   ipratropium (ATROVENT) 0.06 % nasal spray Place 2 sprays into both nostrils 3 (three) times daily as needed for rhinitis.   UNK   metFORMIN (GLUCOPHAGE-XR) 750 MG 24 hr tablet Take 750 mg by mouth daily after breakfast.   02/26/2023   metoprolol succinate (TOPROL-XL) 25 MG 24 hr tablet Take 0.5 tablets (12.5 mg total) by mouth daily. 90 tablet 3 02/26/2023   nitroGLYCERIN (NITROSTAT) 0.4 MG SL tablet Place 1 tablet (0.4 mg total) under the tongue every 5 (five) minutes x 3 doses as needed for chest pain. 25 tablet 3 02/27/2023   ranolazine (RANEXA) 500 MG 12 hr tablet Take 1 tablet (500 mg total) by mouth 2 (two) times daily. 180 tablet 3 02/26/2023   rosuvastatin (CRESTOR) 20  MG tablet Take 1 tablet (20 mg total) by mouth daily. 90 tablet 3 02/26/2023   spironolactone (ALDACTONE) 25 MG tablet Take 0.5 tablets (12.5 mg total) by mouth daily. 45 tablet 3 02/26/2023   Scheduled:   [START ON 02/28/2023] aspirin EC  81 mg Oral Daily   clopidogrel  75 mg Oral Daily   furosemide  80 mg Oral Daily   metoprolol succinate  12.5 mg Oral Daily   rosuvastatin  20 mg Oral Daily   sodium chloride flush  3 mL Intravenous Q12H   [START ON 02/28/2023] spironolactone  12.5 mg Oral Daily   Infusions:   heparin 700 Units/hr (02/27/23 2138)   PRN: acetaminophen **OR** acetaminophen, albuterol, ipratropium, nitroGLYCERIN, ondansetron **OR** ondansetron (ZOFRAN) IV  Assessment: 82 yom with a history of ILD, NSTEMI. Patient is presenting  with chest pain. Heparin per pharmacy consult placed for chest pain/ACS.  Patient is not on anticoagulation prior to arrival.  10/13 AM: heparin level at 0.7 on 700 units/hr (therapeutic). Per RN, no issues with the heparin infusion or signs/symptoms of bleeding. Last CBC stable  Goal of Therapy:  Heparin level 0.3-0.7 units/ml Monitor platelets by anticoagulation protocol: Yes   Plan:  Decrease heparin infusion to 650 units/hr to keep within goal range Check confirmatory anti-Xa level in 8 hours and daily while on heparin Continue to monitor H&H and platelets  Arabella Merles, PharmD. Clinical Pharmacist 02/27/2023 11:54 PM

## 2023-02-27 NOTE — Progress Notes (Signed)
ANTICOAGULATION CONSULT NOTE - Initial Consult  Pharmacy Consult for Heparin Indication: chest pain/ACS  Allergies  Allergen Reactions   Erythromycin Other (See Comments)    Severe Abdominal Pain   Penicillins Hives    Patient Measurements: Height: 5\' 10"  (177.8 cm) Weight: 58.5 kg (129 lb) IBW/kg (Calculated) : 73 Heparin Dosing Weight: 58.5 kg  Vital Signs: Temp: 97.8 F (36.6 C) (10/12 1313) Temp Source: Oral (10/12 1313) BP: 124/79 (10/12 1400) Pulse Rate: 85 (10/12 1400)  Labs: Recent Labs    02/27/23 1323  HGB 13.7  HCT 42.0  PLT 280  CREATININE 1.68*  TROPONINIHS 38*    Estimated Creatinine Clearance: 28.1 mL/min (A) (by C-G formula based on SCr of 1.68 mg/dL (H)).   Medical History: Past Medical History:  Diagnosis Date   Arthritis    CAD (coronary artery disease) 10/05/2022   Cath 09/2022: 3v CAD, critical LM stenosis - Pt opted for med Rx only>>DNR   Chronic kidney disease, stage 3a (HCC)    Chronic urinary tract infection    Diabetes mellitus without complication (HCC)    Gout    Heart murmur    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   S/p non-STEMI 09/2022>> three-vessel and critical left main stenosis; patient opted for medical management // TTE 09/2022: EF 20-25, GR 2 DD, RVSP 59.1, trivial MR, mild-moderate TR, mild aortic stenosis, mean gradient 10, V-max 206 cm/s, RAP 15   History of non-ST elevation myocardial infarction (NSTEMI) 10/05/2022   Hypertension    ILD (interstitial lung disease) (HCC)    by CT 01/2021   Ischemic cardiomyopathy 10/10/2022   Mild aortic stenosis    Mild dilation of ascending aorta (HCC)    Osteoporosis     Medications:  (Not in a hospital admission)  Scheduled:   aspirin  324 mg Oral Once   Infusions:  PRN:   Assessment: 82 yom with a history of ILD, NSTEMI. Patient is presenting with chest pain. Heparin per pharmacy consult placed for chest pain/ACS.  Patient is not on  anticoagulation prior to arrival.  Hgb 13.7; plt 280  Goal of Therapy:  Heparin level 0.3-0.7 units/ml Monitor platelets by anticoagulation protocol: Yes   Plan:  Give IV heparin 3500 units bolus x 1 Start heparin infusion at 700 units/hr Check anti-Xa level in 8 hours and daily while on heparin Continue to monitor H&H and platelets  Delmar Landau, PharmD, BCPS 02/27/2023 3:08 PM ED Clinical Pharmacist -  [phone]

## 2023-02-27 NOTE — ED Provider Notes (Signed)
[place] EMERGENCY DEPARTMENT AT [facility] Provider Note   CSN: [id number] Arrival date & time: 02/27/23  1304     History  Chief Complaint  Patient presents with   Chest Pain   Near Syncope    [name] Steele is a 82 y.o. male.  HPI History limited due to patient's extreme hard of hearing 82 year old male presents today with reports that he had chest pain this morning on awakening.  He took 2 of his nitroglycerin and aspirin.  He felt very lightheaded.  Pain decreased greatly.  He states it is almost gone.  He describes it as severe initially.     Home Medications Prior to Admission medications   Medication Sig Start Date End Date Taking? Authorizing Provider  aspirin EC 81 MG tablet Take 1 tablet (81 mg total) by mouth daily. Swallow whole. 04/14/21   Jake Bathe, MD  clopidogrel (PLAVIX) 75 MG tablet Take 1 tablet (75 mg total) by mouth daily. 11/09/22 11/09/23  Jake Bathe, MD  CRANBERRY PO Take 1 capsule by mouth daily.    [provider]  furosemide (LASIX) 40 MG tablet Take 1 tablet (40 mg total) by mouth as needed (Take as directed by your sliding scale instructions noted on discharge summary.). 10/16/22 10/16/23  Abagail Kitchens, PA-C  furosemide (LASIX) 80 MG tablet Take 1 tablet (80 mg total) by mouth daily at 12 noon. 11/11/22   Jake Bathe, MD  ipratropium (ATROVENT) 0.06 % nasal spray Place 2 sprays into both nostrils 3 (three) times daily as needed for rhinitis. 12/05/21   [provider]  metFORMIN (GLUCOPHAGE-XR) 750 MG 24 hr tablet Take 750 mg by mouth daily after breakfast. 06/05/18   [provider]  metoprolol succinate (TOPROL-XL) 25 MG 24 hr tablet Take 0.5 tablets (12.5 mg total) by mouth daily. 11/02/22   Dyann Kief, PA-C  nitroGLYCERIN (NITROSTAT) 0.4 MG SL tablet Place 1 tablet (0.4 mg total) under the tongue every 5 (five) minutes x 3 doses as needed for chest pain. 10/16/22   Abagail Kitchens, PA-C   ranolazine (RANEXA) 500 MG 12 hr tablet Take 1 tablet (500 mg total) by mouth 2 (two) times daily. 11/09/22   Jake Bathe, MD  rosuvastatin (CRESTOR) 20 MG tablet Take 1 tablet (20 mg total) by mouth daily. 10/10/22 10/10/23  Tereso Newcomer T, PA-C  spironolactone (ALDACTONE) 25 MG tablet Take 0.5 tablets (12.5 mg total) by mouth daily. 11/11/22 02/09/23  Jake Bathe, MD      Allergies    Erythromycin and Penicillins    Review of Systems   Review of Systems  Physical Exam Updated Vital Signs BP (!) 117/99   Pulse 96   Temp 97.8 F (36.6 C) (Oral)   Resp 14   Ht 1.778 m (5\' 10" )   Wt 58.5 kg   SpO2 100%   BMI 18.51 kg/m  Physical Exam Vitals and nursing note reviewed.  HENT:     Head: Normocephalic.  Eyes:     Pupils: Pupils are equal, round, and reactive to light.  Cardiovascular:     Rate and Rhythm: Normal rate and regular rhythm.     Heart sounds: Normal heart sounds.  Pulmonary:     Effort: Pulmonary effort is normal.  Abdominal:     General: Bowel sounds are normal.     Palpations: Abdomen is soft.  Musculoskeletal:        General: Normal range of motion.  Cervical back: Normal range of motion.  Skin:    General: Skin is warm.     Capillary Refill: Capillary refill takes less than 2 seconds.  Neurological:     General: No focal deficit present.     Mental Status: He is alert.     ED Results / Procedures / Treatments   Labs (all labs ordered are listed, but only abnormal results are displayed) Labs Reviewed  CBC WITH DIFFERENTIAL/PLATELET - Abnormal; Notable for the following components:      Result Value   RBC 4.11 (*)    MCV 102.2 (*)    All other components within normal limits  BASIC METABOLIC PANEL - Abnormal; Notable for the following components:   Glucose, Bld 122 (*)    BUN 28 (*)    Creatinine, Ser 1.68 (*)    GFR, Estimated 40 (*)    All other components within normal limits  BRAIN NATRIURETIC PEPTIDE - Abnormal; Notable for the  following components:   B Natriuretic Peptide 354.9 (*)    All other components within normal limits  TROPONIN I (HIGH SENSITIVITY) - Abnormal; Notable for the following components:   Troponin I (High Sensitivity) 38 (*)    All other components within normal limits  HEPARIN LEVEL (UNFRACTIONATED)  TROPONIN I (HIGH SENSITIVITY)    EKG EKG Interpretation Date/Time:  Saturday February 27 2023 14:15:41 EDT Ventricular Rate:  64 PR Interval:  242 QRS Duration:  99 QT Interval:  403 QTC Calculation: 416 R Axis:   -63  Text Interpretation: Sinus rhythm Prolonged PR interval Abnormal R-wave progression, early transition Inferior infarct, old Abnrm T, consider ischemia, anterolateral lds ?posterior MI Confirmed by Margarita Grizzle [phone]) on 02/27/2023 2:16:48 PM  Radiology DG Chest Portable 1 View  Result Date: 02/27/2023 CLINICAL DATA:  Provided history: Chest pain. EXAM: PORTABLE CHEST 1 VIEW COMPARISON:  Prior chest radiographs 10/13/2022 and earlier. Chest CT 04/06/2022. FINDINGS: Heart size within normal limits. Aortic atherosclerosis. Mitral annular calcifications. Emphysema and findings suspicious for fibrotic lung disease were better appreciated on the prior high-resolution chest CT of 04/06/2022. No appreciable airspace consolidation or pulmonary edema. No evidence of pleural effusion or pneumothorax. No acute osseous abnormality identified. Chronic bilateral rib fracture deformities again noted. Partially imaged chronic fracture deformity of the proximal left humerus. Spondylosis and dextrocurvature of the thoracic spine. IMPRESSION: 1. No evidence of an acute cardiopulmonary abnormality. 2. Emphysema and findings suspicious for fibrotic lung disease were better appreciated on the prior high-resolution chest CT of 04/06/2022. Emphysema (ICD10-J43.9). 3. Mitral annular calcifications. 4. Aortic Atherosclerosis (ICD10-I70.0). Electronically Signed   By: Jackey Loge D.O.   On: 02/27/2023 14:51     Procedures .Critical Care  Performed by: Margarita Grizzle, MD Authorized by: Margarita Grizzle, MD   Critical care provider statement:    Critical care time (minutes):  60   Critical care was necessary to treat or prevent imminent or life-threatening deterioration of the following conditions:  Cardiac failure   Critical care was time spent personally by me on the following activities:  Development of treatment plan with patient or surrogate, discussions with consultants, evaluation of patient's response to treatment, examination of patient, ordering and review of laboratory studies, ordering and review of radiographic studies, ordering and performing treatments and interventions, pulse oximetry, re-evaluation of patient's condition and review of old charts     Medications Ordered in ED Medications  aspirin chewable tablet 324 mg (324 mg Oral Not Given 02/27/23 1511)  aspirin EC tablet 81  mg (has no administration in time range)  clopidogrel (PLAVIX) tablet 75 mg (75 mg Oral Given 02/27/23 1525)  rosuvastatin (CRESTOR) tablet 20 mg (20 mg Oral Given 02/27/23 1526)  metoprolol succinate (TOPROL-XL) 24 hr tablet 12.5 mg (12.5 mg Oral Given 02/27/23 1525)  furosemide (LASIX) tablet 80 mg (80 mg Oral Given 02/27/23 1526)  nitroGLYCERIN (NITROSTAT) SL tablet 0.4 mg (has no administration in time range)  heparin ADULT infusion 100 units/mL (25000 units/255mL) (700 Units/hr Intravenous New Bag/Given 02/27/23 1531)  heparin bolus via infusion 3,500 Units (3,500 Units Intravenous Bolus from Bag 02/27/23 1531)    ED Course/ Medical Decision Making/ A&P Clinical Course as of 02/28/23 0824  Sat Feb 27, 2023  1420 Chest x-Jerrold Haskell reviewed interpreted no evidence of acute abnormality noted on my interpretation awaiting [DR]  1420  radiology interpretation [DR]  1420 CBC reviewed interpreted within normal limits  [DR]  1420 Basic metabolic panel reviewed interpreted significant for BUN creatinine at 1.68  slightly elevated from baseline of 1.5 [DR]  1421 BNP increased at 354 Troponin 38 [DR]  1543 Recevied sign out from Dr. Rosalia Hammers, presenting with chest pain, pending labs. Symptoms improved with NTG. Cardiology going to come see patient. Will need admission  [WS]  1749 Cardiology saw patient.  They recommended heparin drip and admission.  Patient has been started on heparin.  Discussed with Dr. Katrinka Blazing with the hospitalist service who admit the patient for further management. [WS]    Clinical Course User Index [DR] Margarita Grizzle, MD [WS] Lonell Grandchild, MD                                 Medical Decision Making Amount and/or Complexity of Data Reviewed Labs: ordered.  Risk OTC drugs. Prescription drug management. Decision regarding hospitalization.   82 year old male with known coronary artery disease, status post left heart catheterization May 2024 that demonstrated critical LM stenosis and severe LAD and OM stenosis with echo with EF 20 to 25%.  They report in cardiology note that CABG was considered that the patient was concerned he would not recover from surgery and opted for medical management only.  His CODE STATUS was changed to DNR.  Today he had significant chest pain.  It did resolve with his nitro and is decreased but he continues to have ST depression in V2 and V3 concerning for posterior MI. Care discussed with Dr. Wyline Mood.  She will see in consultation. We will begin aspirin and heparin per pharmacy consult.  Plan admission Signout to Dr. Suezanne Jacquet, who will dispo after cardiology sees       Final Clinical Impression(s) / ED Diagnoses Final diagnoses:  Chest pain due to myocardial ischemia, unspecified ischemic chest pain type    Rx / DC Orders ED Discharge Orders     None         Margarita Grizzle, MD 02/27/23 1547    Margarita Grizzle, MD 02/28/23 0825

## 2023-02-27 NOTE — ED Notes (Signed)
Pt is being transported upstairs to assigned room

## 2023-02-27 NOTE — ED Provider Triage Note (Signed)
Emergency Medicine Provider Triage Evaluation Note  [name] Steele , a 82 y.o. male  was evaluated in triage.  Pt complains of chest pressure that started this morning.  Patient notes chest pressure has resolved after taking 2 nitroglycerin and ASA 324 mg.  Admits to lightheadedness after taking nitroglycerin.  Significant cardiac history. Patient is currently chest pain free in triage.   Review of Systems  Positive: CP Negative: fever  Physical Exam  BP 114/74 (BP Location: Right Arm)   Pulse 85   Temp 97.8 F (36.6 C) (Oral)   Resp 18   Ht 5\' 10"  (1.778 m)   Wt 58.5 kg   SpO2 100%   BMI 18.51 kg/m  Gen:   Awake, no distress   Resp:  Normal effort  MSK:   Moves extremities without difficulty  Other:    Medical Decision Making  Medically screening exam initiated at 1:18 PM.  Appropriate orders placed.  [name] Steele was informed that the remainder of the evaluation will be completed by another provider, this initial triage assessment does not replace that evaluation, and the importance of remaining in the ED until their evaluation is complete.  Cardiac labs   [name] Steele 02/27/23 1319

## 2023-02-27 NOTE — ED Provider Notes (Signed)
  Procedures  .Critical Care  Performed by: Lonell Grandchild, MD Authorized by: Lonell Grandchild, MD   Critical care provider statement:    Critical care time (minutes):  30   Critical care was necessary to treat or prevent imminent or life-threatening deterioration of the following conditions:  Cardiac failure   Critical care was time spent personally by me on the following activities:  Development of treatment plan with patient or surrogate, discussions with consultants, evaluation of patient's response to treatment, examination of patient, ordering and review of laboratory studies, ordering and review of radiographic studies, ordering and performing treatments and interventions, pulse oximetry, re-evaluation of patient's condition and review of old charts   Care discussed with: admitting provider     ED Course / MDM   Clinical Course as of 02/27/23 1750  Sat Feb 27, 2023  1420 Chest x-ray reviewed interpreted no evidence of acute abnormality noted on my interpretation awaiting [DR]  1420  radiology interpretation [DR]  1420 CBC reviewed interpreted within normal limits  [DR]  1420 Basic metabolic panel reviewed interpreted significant for BUN creatinine at 1.68 slightly elevated from baseline of 1.5 [DR]  1421 BNP increased at 354 Troponin 38 [DR]  1543 Recevied sign out from Dr. Rosalia Hammers, presenting with chest pain, pending labs. Symptoms improved with NTG. Cardiology going to come see patient. Will need admission  [WS]  1749 Cardiology saw patient.  They recommended heparin drip and admission.  Patient has been started on heparin.  Discussed with Dr. Katrinka Blazing with the hospitalist service who admit the patient for further management. [WS]    Clinical Course User Index [DR] Margarita Grizzle, MD [WS] Lonell Grandchild, MD   Medical Decision Making Amount and/or Complexity of Data Reviewed Labs: ordered.  Risk OTC drugs. Prescription drug management. Decision regarding  hospitalization.         Lonell Grandchild, MD 02/27/23 1750

## 2023-02-27 NOTE — ED Notes (Signed)
ED TO INPATIENT HANDOFF REPORT  ED Nurse Name and Phone #: [name] Steele [phone]  S Name/Age/Gender [name] Steele 82 y.o. male Room/Bed: 039C/039C  Code Status   Code Status: Do not attempt resuscitation (DNR) PRE-ARREST INTERVENTIONS DESIRED  Home/SNF/Other Home Patient oriented to: self, place, time, and situation Is this baseline? Yes   Triage Complete: Triage complete  Chief Complaint Chest pain [R07.9]  Triage Note Patient presents to ed via [facility] states he  woke up this am with c/o chest pressure took 2 of his NTG and asa324 mg got up after taking NTG and felt very light headed. Felt very tired. Now denies chest pressure and states he feels better.    Allergies Allergies  Allergen Reactions   Ak-Mycin [Erythromycin] Other (See Comments)    Severe Abdominal Pain   Penicillins Hives    Level of Care/Admitting Diagnosis ED Disposition     ED Disposition  Admit   Condition  --   Comment  Hospital [facility]: [facility] [[id number]]  Level of Care: Telemetry Cardiac [103]  May admit patient to [facility] or [facility] if equivalent level of care is available:: No  Covid Evaluation: Asymptomatic - no recent exposure (last 10 days) testing not required  Diagnosis: Chest pain [744799]  Admitting Physician: Clydie Braun [[id number]]  Attending Physician: Clydie Braun [[id number]]  Certification:: I certify this patient will need inpatient services for at least 2 midnights          B Medical/Surgery History Past Medical History:  Diagnosis Date   Arthritis    CAD (coronary artery disease) 10/05/2022   Cath 09/2022: 3v CAD, critical LM stenosis - Pt opted for med Rx only>>DNR   Chronic kidney disease, stage 3a (HCC)    Chronic urinary tract infection    Diabetes mellitus without complication (HCC)    Gout    Heart murmur    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   S/p non-STEMI 09/2022>> three-vessel and critical  left main stenosis; patient opted for medical management // TTE 09/2022: EF 20-25, GR 2 DD, RVSP 59.1, trivial MR, mild-moderate TR, mild aortic stenosis, mean gradient 10, V-max 206 cm/s, RAP 15   History of non-ST elevation myocardial infarction (NSTEMI) 10/05/2022   Hypertension    ILD (interstitial lung disease) (HCC)    by CT 01/2021   Ischemic cardiomyopathy 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   Mild aortic stenosis    Mild dilation of ascending aorta (HCC)    Osteoporosis    Past Surgical History:  Procedure Laterality Date   AORTIC ARCH ANGIOGRAPHY N/A 10/05/2022   Procedure: AORTIC ARCH ANGIOGRAPHY;  Surgeon: Tonny Bollman, MD;  Location: [facility] INVASIVE CV LAB;  Service: Cardiovascular;  Laterality: N/A;   CHOLECYSTECTOMY N/A 07/04/2018   Procedure: LAPAROSCOPIC CHOLECYSTECTOMY WITH INTRAOPERATIVE CHOLANGIOGRAM;  Surgeon: Abigail Miyamoto, MD;  Location: [facility] ORS;  Service: General;  Laterality: N/A;   ERCP N/A 07/05/2018   Procedure: ENDOSCOPIC RETROGRADE CHOLANGIOPANCREATOGRAPHY (ERCP);  Surgeon: Vida Rigger, MD;  Location: [facility];  Service: Endoscopy;  Laterality: N/A;   IABP INSERTION N/A 10/05/2022   Procedure: IABP Insertion;  Surgeon: Tonny Bollman, MD;  Location: [facility] INVASIVE CV LAB;  Service: Cardiovascular;  Laterality: N/A;   INGUINAL HERNIA REPAIR Right 07/31/2022   Procedure: OPEN RIGHT INGUINAL HERNIA REPAIR WITH MESH;  Surgeon: Berna Bue, MD;  Location: [facility] ORS;  Service: General;  Laterality: Right;   JOINT REPLACEMENT Bilateral    REMOVAL OF STONES  07/05/2018  Procedure: REMOVAL OF STONES;  Surgeon: Vida Rigger, MD;  Location: [facility] ENDOSCOPY;  Service: Endoscopy;;   RIGHT/LEFT HEART CATH AND CORONARY ANGIOGRAPHY N/A 10/05/2022   Procedure: RIGHT/LEFT HEART CATH AND CORONARY ANGIOGRAPHY;  Surgeon: Tonny Bollman, MD;  Location: [facility] INVASIVE CV LAB;  Service: Cardiovascular;  Laterality: N/A;   SPHINCTEROTOMY  07/05/2018   Procedure: SPHINCTEROTOMY;  Surgeon: Vida Rigger, MD;  Location:  [facility] ENDOSCOPY;  Service: Endoscopy;;     A IV Location/Drains/Wounds Patient Lines/Drains/Airways Status     Active Line/Drains/Airways     Name Placement date Placement time Site Days   Peripheral IV 02/27/23 20 G Left Antecubital 02/27/23  --  Antecubital  less than 1            Intake/Output Last 24 hours No intake or output data in the 24 hours ending 02/27/23 2002  Labs/Imaging Results for orders placed or performed during the hospital encounter of 02/27/23 (from the past 48 hour(s))  CBC with Differential     Status: Abnormal   Collection Time: 02/27/23  1:23 PM  Result Value Ref Range   WBC 5.7 4.0 - 10.5 K/uL   RBC 4.11 (L) 4.22 - 5.81 MIL/uL   Hemoglobin 13.7 13.0 - 17.0 g/dL   HCT 16.1 09.6 - 04.5 %   MCV 102.2 (H) 80.0 - 100.0 fL   MCH 33.3 26.0 - 34.0 pg   MCHC 32.6 30.0 - 36.0 g/dL   RDW 40.9 81.1 - 91.4 %   Platelets 280 150 - 400 K/uL   nRBC 0.0 0.0 - 0.2 %   Neutrophils Relative % 62 %   Neutro Abs 3.6 1.7 - 7.7 K/uL   Lymphocytes Relative 24 %   Lymphs Abs 1.4 0.7 - 4.0 K/uL   Monocytes Relative 11 %   Monocytes Absolute 0.6 0.1 - 1.0 K/uL   Eosinophils Relative 1 %   Eosinophils Absolute 0.1 0.0 - 0.5 K/uL   Basophils Relative 1 %   Basophils Absolute 0.0 0.0 - 0.1 K/uL   Immature Granulocytes 1 %   Abs Immature Granulocytes 0.03 0.00 - 0.07 K/uL    Comment: Performed at [facility], [address] [place], [place] [address]  Basic metabolic panel     Status: Abnormal   Collection Time: 02/27/23  1:23 PM  Result Value Ref Range   Sodium 141 135 - 145 mmol/L   Potassium 4.4 3.5 - 5.1 mmol/L   Chloride 101 98 - 111 mmol/L   CO2 27 22 - 32 mmol/L   Glucose, Bld 122 (H) 70 - 99 mg/dL    Comment: Glucose reference range applies only to samples taken after fasting for at least 8 hours.   BUN 28 (H) 8 - 23 mg/dL   Creatinine, Ser 6.21 (H) 0.61 - 1.24 mg/dL   Calcium 9.6 8.9 - 30.8 mg/dL   GFR, Estimated 40 (L) >60 mL/min    Comment:  (NOTE) Calculated using the CKD-EPI Creatinine Equation (2021)    Anion gap 13 5 - 15    Comment: Performed at [facility], [address] [place], [place] [address]  Troponin I (High Sensitivity)     Status: Abnormal   Collection Time: 02/27/23  1:23 PM  Result Value Ref Range   Troponin I (High Sensitivity) 38 (H) <18 ng/L    Comment: (NOTE) Elevated high sensitivity troponin I (hsTnI) values and significant  changes across serial measurements may suggest ACS but many other  chronic and acute  conditions are known to elevate hsTnI results.  Refer to the "Links" section for chest pain algorithms and additional  guidance. Performed at [facility], [address], [place], [place] [address]   Brain natriuretic peptide     Status: Abnormal   Collection Time: 02/27/23  1:23 PM  Result Value Ref Range   B Natriuretic Peptide 354.9 (H) 0.0 - 100.0 pg/mL    Comment: Performed at [facility], [address], [place], [place] [address]  Troponin I (High Sensitivity)     Status: Abnormal   Collection Time: 02/27/23  3:17 PM  Result Value Ref Range   Troponin I (High Sensitivity) 25 (H) <18 ng/L    Comment: (NOTE) Elevated high sensitivity troponin I (hsTnI) values and significant  changes across serial measurements may suggest ACS but many other  chronic and acute conditions are known to elevate hsTnI results.  Refer to the "Links" section for chest pain algorithms and additional  guidance. Performed at [facility], [address], [place], [place] [address]    DG Chest Portable 1 View  Result Date: 02/27/2023 CLINICAL DATA:  Provided history: Chest pain. EXAM: PORTABLE CHEST 1 VIEW COMPARISON:  Prior chest radiographs 10/13/2022 and earlier. Chest CT 04/06/2022. FINDINGS: Heart size within normal limits. Aortic atherosclerosis. Mitral annular calcifications. Emphysema and findings suspicious for fibrotic lung disease were better appreciated on the prior  high-resolution chest CT of 04/06/2022. No appreciable airspace consolidation or pulmonary edema. No evidence of pleural effusion or pneumothorax. No acute osseous abnormality identified. Chronic bilateral rib fracture deformities again noted. Partially imaged chronic fracture deformity of the proximal left humerus. Spondylosis and dextrocurvature of the thoracic spine. IMPRESSION: 1. No evidence of an acute cardiopulmonary abnormality. 2. Emphysema and findings suspicious for fibrotic lung disease were better appreciated on the prior high-resolution chest CT of 04/06/2022. Emphysema (ICD10-J43.9). 3. Mitral annular calcifications. 4. Aortic Atherosclerosis (ICD10-I70.0). Electronically Signed   By: Jackey Loge D.O.   On: 02/27/2023 14:51    Pending Labs Unresulted Labs (From admission, onward)     Start     Ordered   02/28/23 0500  Heparin level (unfractionated)  Daily,   R      02/27/23 1514   02/28/23 0500  Basic metabolic panel  Tomorrow morning,   R        02/27/23 1742   02/27/23 2300  Heparin level (unfractionated)  Once-Timed,   URGENT        02/27/23 1514            Vitals/Pain Today's Vitals   02/27/23 1715 02/27/23 1730 02/27/23 1800 02/27/23 1815  BP:  100/78 107/66 119/66  Pulse: (!) 119 73 88 62  Resp: 17 (!) 25 16 (!) 22  Temp:      TempSrc:      SpO2: 99% 94% 99% 100%  Weight:      Height:      PainSc:        Isolation Precautions No active isolations  Medications Medications  aspirin chewable tablet 324 mg (324 mg Oral Not Given 02/27/23 1511)  aspirin EC tablet 81 mg (has no administration in time range)  clopidogrel (PLAVIX) tablet 75 mg (75 mg Oral Given 02/27/23 1525)  rosuvastatin (CRESTOR) tablet 20 mg (20 mg Oral Given 02/27/23 1526)  metoprolol succinate (TOPROL-XL) 24 hr tablet 12.5 mg (12.5 mg Oral Given 02/27/23 1525)  furosemide (LASIX) tablet 80 mg (80 mg Oral Given 02/27/23 1526)  nitroGLYCERIN (  NITROSTAT) SL tablet 0.4 mg (has no  administration in time range)  heparin ADULT infusion 100 units/mL (25000 units/267mL) (700 Units/hr Intravenous New Bag/Given 02/27/23 1531)  sodium chloride flush (NS) 0.9 % injection 3 mL (has no administration in time range)  acetaminophen (TYLENOL) tablet 650 mg (has no administration in time range)    Or  acetaminophen (TYLENOL) suppository 650 mg (has no administration in time range)  ondansetron (ZOFRAN) tablet 4 mg (has no administration in time range)    Or  ondansetron (ZOFRAN) injection 4 mg (has no administration in time range)  albuterol (PROVENTIL) (2.5 MG/3ML) 0.083% nebulizer solution 2.5 mg (has no administration in time range)  ipratropium (ATROVENT) 0.06 % nasal spray 2 spray (has no administration in time range)  heparin bolus via infusion 3,500 Units (3,500 Units Intravenous Bolus from Bag 02/27/23 1531)    Mobility walks with person assist     Focused Assessments Cardiac Assessment Handoff:  Cardiac Rhythm: Normal sinus rhythm Lab Results  Component Value Date   CKTOTAL 152 10/06/2008   CKMB 2.9 10/06/2008   TROPONINI <0.03 12/15/2017   Lab Results  Component Value Date   DDIMER 0.97 (H) 12/15/2017   Does the Patient currently have chest pain? No    R Recommendations: See Admitting Provider Note  Report given to:   Additional Notes:

## 2023-02-28 DIAGNOSIS — I209 Angina pectoris, unspecified: Secondary | ICD-10-CM | POA: Diagnosis not present

## 2023-02-28 DIAGNOSIS — R55 Syncope and collapse: Secondary | ICD-10-CM | POA: Diagnosis not present

## 2023-02-28 DIAGNOSIS — I255 Ischemic cardiomyopathy: Secondary | ICD-10-CM | POA: Diagnosis not present

## 2023-02-28 DIAGNOSIS — N1832 Chronic kidney disease, stage 3b: Secondary | ICD-10-CM | POA: Diagnosis not present

## 2023-02-28 DIAGNOSIS — I200 Unstable angina: Secondary | ICD-10-CM

## 2023-02-28 DIAGNOSIS — R7989 Other specified abnormal findings of blood chemistry: Secondary | ICD-10-CM

## 2023-02-28 DIAGNOSIS — I259 Chronic ischemic heart disease, unspecified: Secondary | ICD-10-CM | POA: Diagnosis not present

## 2023-02-28 LAB — BASIC METABOLIC PANEL
Anion gap: 13 (ref 5–15)
BUN: 27 mg/dL — ABNORMAL HIGH (ref 8–23)
CO2: 25 mmol/L (ref 22–32)
Calcium: 9.3 mg/dL (ref 8.9–10.3)
Chloride: 102 mmol/L (ref 98–111)
Creatinine, Ser: 1.56 mg/dL — ABNORMAL HIGH (ref 0.61–1.24)
GFR, Estimated: 44 mL/min — ABNORMAL LOW (ref >60)
Glucose, Bld: 96 mg/dL (ref 70–99)
Potassium: 3.6 mmol/L (ref 3.5–5.1)
Sodium: 140 mmol/L (ref 135–145)

## 2023-02-28 LAB — HEPARIN LEVEL (UNFRACTIONATED)
Heparin Unfractionated: 0.56 [IU]/mL (ref 0.30–0.70)
Heparin Unfractionated: 0.61 [IU]/mL (ref 0.30–0.70)

## 2023-02-28 MED ORDER — METOPROLOL SUCCINATE ER 25 MG PO TB24
12.5000 mg | ORAL_TABLET | Freq: Every day | ORAL | Status: DC
  Administered 2023-02-28 – 2023-03-02 (×3): 12.5 mg via ORAL
  Filled 2023-02-28 (×3): qty 1

## 2023-02-28 MED ORDER — ASPIRIN 81 MG PO TBEC
81.0000 mg | DELAYED_RELEASE_TABLET | Freq: Every day | ORAL | Status: DC
  Administered 2023-02-28 – 2023-03-02 (×3): 81 mg via ORAL
  Filled 2023-02-28 (×3): qty 1

## 2023-02-28 MED ORDER — ROSUVASTATIN CALCIUM 20 MG PO TABS
20.0000 mg | ORAL_TABLET | Freq: Every day | ORAL | Status: DC
  Administered 2023-02-28 – 2023-03-02 (×3): 20 mg via ORAL
  Filled 2023-02-28 (×3): qty 1

## 2023-02-28 MED ORDER — CLOPIDOGREL BISULFATE 75 MG PO TABS
75.0000 mg | ORAL_TABLET | Freq: Every day | ORAL | Status: DC
  Administered 2023-02-28 – 2023-03-02 (×3): 75 mg via ORAL
  Filled 2023-02-28 (×3): qty 1

## 2023-02-28 MED ORDER — RANOLAZINE ER 500 MG PO TB12
500.0000 mg | ORAL_TABLET | Freq: Two times a day (BID) | ORAL | Status: DC
  Administered 2023-02-28 – 2023-03-02 (×5): 500 mg via ORAL
  Filled 2023-02-28 (×5): qty 1

## 2023-02-28 NOTE — Progress Notes (Signed)
Rounding Note    Patient Name: [name] Steele Date of Encounter: 02/28/2023  [facility] Cardiologist: Donato Schultz, MD   Subjective   While he was getting orthostatics today he felt lightheaded and dizzy with standing.  Otherwise he is not having persistent chest pain  Inpatient Medications    Scheduled Meds:  aspirin EC  81 mg Oral Daily   clopidogrel  75 mg Oral Daily   metoprolol succinate  12.5 mg Oral Daily   ranolazine  500 mg Oral BID   rosuvastatin  20 mg Oral Daily   sodium chloride flush  3 mL Intravenous Q12H   spironolactone  12.5 mg Oral Daily   Continuous Infusions:  heparin 650 Units/hr (02/28/23 0126)   PRN Meds: acetaminophen **OR** acetaminophen, albuterol, ipratropium, nitroGLYCERIN, ondansetron **OR** ondansetron (ZOFRAN) IV   Vital Signs    Vitals:   02/28/23 0320 02/28/23 0405 02/28/23 1041 02/28/23 1047  BP: 125/70 117/60    Pulse: 64 (!) 53 78 77  Resp: 18 20  18   Temp:    98.4 F (36.9 C)  TempSrc:  Oral  Oral  SpO2: 100% 100%  100%  Weight:      Height:        Intake/Output Summary (Last 24 hours) at 02/28/2023 1113 Last data filed at 02/28/2023 0626 Gross per 24 hour  Intake 77.57 ml  Output 500 ml  Net -422.43 ml      02/28/2023    3:11 AM 02/27/2023    9:20 PM 02/27/2023    1:14 PM  Last 3 Weights  Weight (lbs) 129 lb 6.6 oz 129 lb 6.4 oz 129 lb  Weight (kg) 58.7 kg 58.695 kg 58.514 kg      Telemetry    Sinus rhythm- Personally Reviewed  ECG    No new- Personally Reviewed  Physical Exam   Vitals:   02/28/23 1041 02/28/23 1047  BP:    Pulse: 78 77  Resp:  18  Temp:  98.4 F (36.9 C)  SpO2:  100%    GEN: No acute distress.   Neck: No JVD Cardiac: RRR, no murmurs, rubs, or gallops.  Respiratory: Clear to auscultation bilaterally. GI: Soft, nontender, non-distended  MS: No edema; No deformity. Neuro:  Nonfocal  Psych: Normal affect   Labs    High Sensitivity Troponin:   Recent Labs   Lab 02/27/23 1323 02/27/23 1517  TROPONINIHS 38* 25*     Chemistry Recent Labs  Lab 02/27/23 1323 02/28/23 0447  NA 141 140  K 4.4 3.6  CL 101 102  CO2 27 25  GLUCOSE 122* 96  BUN 28* 27*  CREATININE 1.68* 1.56*  CALCIUM 9.6 9.3  GFRNONAA 40* 44*  ANIONGAP 13 13    Lipids No results for input(s): "CHOL", "TRIG", "HDL", "LABVLDL", "LDLCALC", "CHOLHDL" in the last 168 hours.  Hematology Recent Labs  Lab 02/27/23 1323  WBC 5.7  RBC 4.11*  HGB 13.7  HCT 42.0  MCV 102.2*  MCH 33.3  MCHC 32.6  RDW 13.2  PLT 280   Thyroid No results for input(s): "TSH", "FREET4" in the last 168 hours.  BNP Recent Labs  Lab 02/27/23 1323  BNP 354.9*    DDimer No results for input(s): "DDIMER" in the last 168 hours.   Radiology    DG Chest Portable 1 View  Result Date: 02/27/2023 CLINICAL DATA:  Provided history: Chest pain. EXAM: PORTABLE CHEST 1 VIEW COMPARISON:  Prior chest radiographs 10/13/2022 and earlier. Chest CT 04/06/2022.  FINDINGS: Heart size within normal limits. Aortic atherosclerosis. Mitral annular calcifications. Emphysema and findings suspicious for fibrotic lung disease were better appreciated on the prior high-resolution chest CT of 04/06/2022. No appreciable airspace consolidation or pulmonary edema. No evidence of pleural effusion or pneumothorax. No acute osseous abnormality identified. Chronic bilateral rib fracture deformities again noted. Partially imaged chronic fracture deformity of the proximal left humerus. Spondylosis and dextrocurvature of the thoracic spine. IMPRESSION: 1. No evidence of an acute cardiopulmonary abnormality. 2. Emphysema and findings suspicious for fibrotic lung disease were better appreciated on the prior high-resolution chest CT of 04/06/2022. Emphysema (ICD10-J43.9). 3. Mitral annular calcifications. 4. Aortic Atherosclerosis (ICD10-I70.0). Electronically Signed   By: Jackey Loge D.O.   On: 02/27/2023 14:51    Cardiac Studies   TTE  10/05/2022  1. Presence of apical swirling/sludge with definity contrast without  evidence of formed thrombus. Left ventricular ejection fraction, by  estimation, is 20 to 25%. The left ventricle has severely decreased  function. The left ventricle demonstrates global   hypokinesis. Left ventricular diastolic parameters are consistent with  Grade II diastolic dysfunction (pseudonormalization).   2. Right ventricular systolic function is normal. The right ventricular  size is normal. There is moderately elevated pulmonary artery systolic  pressure. The estimated right ventricular systolic pressure is 59.1 mmHg.   3. The mitral valve is grossly normal. Trivial mitral valve  regurgitation.   4. Tricuspid valve regurgitation is mild to moderate.   5. The aortic valve is calcified. There is moderate calcification of the  aortic valve. There is moderate thickening of the aortic valve. Aortic  valve regurgitation is mild. Mild aortic valve stenosis. Aortic valve  area, by VTI measures 1.11 cm. Aortic  valve mean gradient measures 10.0 mmHg. Aortic valve Vmax measures 2.06  m/s.   6. The inferior vena cava is dilated in size with <50% respiratory  variability, suggesting right atrial pressure of 15 mmHg.   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 Formal echo 10/05/22 showed EF 20-25%. Declined CABG and palliative was recommended who is being seen 02/27/2023 for the evaluation of CP at the request of Dr. Rosalia Hammers.   Assessment & Plan    Angina He has known critical LM and this was discussed in May 2024. He was felt too high risk/ declined CABG with age. PCI was not attempted 2/2 recovery concerns per the documentation. He required an IABP at that time.  Palliative care was recommended. Considering this, I discussed with his family that the plan will be to medical management for ACS.  - can start heparin gtt and continue for  48 hours -His troponins are flat - s/p aspirin load - asa 81 mg and plavix 75 mg daily  - continue crestor 20 mg daily - cont metop XL 12.5 mg daily - nitroglycerin SL PRN   Ischemic CM EF 20-25% - He reported some shortness of breath, but he is euvolemic on exam.  BNP is much improved from June. -He was lightheaded with standing today we will stop his Lasix  - can continue home spironolactone 12.5 mg daily - with age and DNR status, would not be aggressive with GDMT  Orthostatic -He had symptoms of orthostasis with standing today.  He can benefit from gentle fluids. I stopped his diuretic  As long as he is not having any chest pressure he be discharged after 48 hours of  heparin.  This will be on Monday.  For questions or updates, please contact [facility] Please consult [email or web address] for contact info under        Signed, Maisie Fus, MD  02/28/2023, 11:13 AM

## 2023-02-28 NOTE — Progress Notes (Signed)
ANTICOAGULATION CONSULT NOTE   Pharmacy Consult for Heparin Indication: chest pain/ACS  Allergies  Allergen Reactions   Ak-Mycin [Erythromycin] Other (See Comments)    Severe Abdominal Pain   Penicillins Hives    Patient Measurements: Height: 5\' 10"  (177.8 cm) Weight: 58.7 kg (129 lb 6.6 oz) IBW/kg (Calculated) : 73 Heparin Dosing Weight: 58.5 kg  Vital Signs: Temp: 98.4 F (36.9 C) (10/12 2120) Temp Source: Oral (10/13 0405) BP: 117/60 (10/13 0405) Pulse Rate: 53 (10/13 0405)  Labs: Recent Labs    02/27/23 1323 02/27/23 1517 02/27/23 2307 02/28/23 0447 02/28/23 0746  HGB 13.7  --   --   --   --   HCT 42.0  --   --   --   --   PLT 280  --   --   --   --   HEPARINUNFRC  --   --  0.70 0.56 0.61  CREATININE 1.68*  --   --  1.56*  --   TROPONINIHS 38* 25*  --   --   --     Estimated Creatinine Clearance: 30.3 mL/min (A) (by C-G formula based on SCr of 1.56 mg/dL (H)).   Medical History: Past Medical History:  Diagnosis Date   Arthritis    CAD (coronary artery disease) 10/05/2022   Cath 09/2022: 3v CAD, critical LM stenosis - Pt opted for med Rx only>>DNR   Chronic kidney disease, stage 3a (HCC)    Chronic urinary tract infection    Diabetes mellitus without complication (HCC)    Gout    Heart murmur    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   S/p non-STEMI 09/2022>> three-vessel and critical left main stenosis; patient opted for medical management // TTE 09/2022: EF 20-25, GR 2 DD, RVSP 59.1, trivial MR, mild-moderate TR, mild aortic stenosis, mean gradient 10, V-max 206 cm/s, RAP 15   History of non-ST elevation myocardial infarction (NSTEMI) 10/05/2022   Hypertension    ILD (interstitial lung disease) (HCC)    by CT 01/2021   Ischemic cardiomyopathy 10/10/2022   Mild aortic stenosis    Mild dilation of ascending aorta (HCC)    Osteoporosis     Medications:  Medications Prior to Admission  Medication Sig Dispense Refill Last  Dose   aspirin EC 81 MG tablet Take 1 tablet (81 mg total) by mouth daily. Swallow whole. 90 tablet 3 02/27/2023   clopidogrel (PLAVIX) 75 MG tablet Take 1 tablet (75 mg total) by mouth daily. 90 tablet 3 02/26/2023   CRANBERRY PO Take 1 capsule by mouth daily.   02/26/2023   furosemide (LASIX) 40 MG tablet Take 1 tablet (40 mg total) by mouth as needed (Take as directed by your sliding scale instructions noted on discharge summary.). 90 tablet 3 NEVER   furosemide (LASIX) 80 MG tablet Take 1 tablet (80 mg total) by mouth daily at 12 noon. 90 tablet 6 02/26/2023   ipratropium (ATROVENT) 0.06 % nasal spray Place 2 sprays into both nostrils 3 (three) times daily as needed for rhinitis.   UNK   metFORMIN (GLUCOPHAGE-XR) 750 MG 24 hr tablet Take 750 mg by mouth daily after breakfast.   02/26/2023   metoprolol succinate (TOPROL-XL) 25 MG 24 hr tablet Take 0.5 tablets (12.5 mg total) by mouth daily. 90 tablet 3 02/26/2023   nitroGLYCERIN (NITROSTAT) 0.4 MG SL tablet Place 1 tablet (0.4 mg total) under the tongue every 5 (five) minutes x 3 doses as needed for chest pain.  25 tablet 3 02/27/2023   ranolazine (RANEXA) 500 MG 12 hr tablet Take 1 tablet (500 mg total) by mouth 2 (two) times daily. 180 tablet 3 02/26/2023   rosuvastatin (CRESTOR) 20 MG tablet Take 1 tablet (20 mg total) by mouth daily. 90 tablet 3 02/26/2023   spironolactone (ALDACTONE) 25 MG tablet Take 0.5 tablets (12.5 mg total) by mouth daily. 45 tablet 3 02/26/2023   Scheduled:   aspirin EC  81 mg Oral Daily   clopidogrel  75 mg Oral Daily   furosemide  80 mg Oral Daily   metoprolol succinate  12.5 mg Oral Daily   ranolazine  500 mg Oral BID   rosuvastatin  20 mg Oral Daily   sodium chloride flush  3 mL Intravenous Q12H   spironolactone  12.5 mg Oral Daily   Infusions:   heparin 650 Units/hr (02/28/23 0126)   PRN: acetaminophen **OR** acetaminophen, albuterol, ipratropium, nitroGLYCERIN, ondansetron **OR** ondansetron (ZOFRAN)  IV  Assessment: 82 yom with a history of ILD, NSTEMI. Patient is presenting with chest pain. Heparin per pharmacy consult placed for chest pain/ACS.  Patient is not on anticoagulation prior to arrival.  10/13 AM: heparin level therapeutic at 0.61 on 650 units/hr (therapeutic x2). Per RN, no issues with the heparin infusion or signs/symptoms of bleeding. Last CBC stable. Noted per cardiology, will continue heparin for 48 hours. Anticipate discontinuing heparin on 10/14 around 1500.   Goal of Therapy:  Heparin level 0.3-0.7 units/ml Monitor platelets by anticoagulation protocol: Yes   Plan:  Continue heparin infusion at 650 units/hr Check heparin level and CBC daily  Monitor for s/sx of bleeding  Lennie Muckle, PharmD PGY1 Pharmacy Resident 02/28/2023 9:22 AM

## 2023-02-28 NOTE — Progress Notes (Signed)
[facility] PROGRESS NOTE    Progress Note  [name] Steele  ZOX:[id number] DOB: 22-Jan-1941 DOA: 02/27/2023 PCP: [name], MD     Brief Narrative:   [name] Steele is an 82 y.o. 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 Elective care was consulted and recommended on last admission. In the ED was continue on aspirin, Plavix, metoprolol, statin and IV heparin for 48 hours. Cardiac biomarkers less than 40 and improving. Cardiology has been consulted who discussed with the family they will treat medically.  Near syncope: PT OT consulted. Orthostatics are pending.  Ischemic cardiomyopathy/acute systolic and diastolic  heart failure: Last 2D echo that showed an EF of 20 to 25% grade 2 diastolic dysfunction. Chest x-ray showed no pulmonary edema emphysematous changes annular calcification. BNP is 350  Chronic kidney see stage IIIb: With a baseline creatinine of 1.3-1.5  started on IV Lasix this morning is 1.5. Continue to monitor.  Diabetes mellitus type 2 with long-term insulin use: A1c of 5.3, holding metformin. Fasting blood glucose less than 100.  Hyperlipidemia: Continue statins.  DVT prophylaxis: heparin Family Communication:wife Status is: Inpatient Remains inpatient appropriate because: Acute coronary syndrome    Code Status:     Code Status Orders  (From admission, onward)           Start     Ordered   02/27/23 1741  Do not attempt resuscitation (DNR) Pre-Arrest Interventions Desired  Continuous       Question Answer Comment  If pulseless and not breathing No CPR or chest compressions.   In Pre-Arrest Conditions (Patient Has Pulse and Is Breathing) May intubate, use advanced airway interventions and cardioversion/ACLS medications if appropriate or indicated. May transfer to ICU.   Consent: Discussion documented in EHR or advanced directives reviewed      02/27/23 1742           Code Status History     Date Active Date Inactive Code Status Order ID Comments User Context   10/13/2022 1432 10/16/2022 2315 DNR [id number]  Arty Baumgartner, NP ED   10/06/2022 1105 10/10/2022 1806 DNR [id number]  Runell Gess, MD Inpatient   10/05/2022 0434 10/06/2022 1105 Full Code [id number]  Hermelinda Dellen, MD ED   02/06/2022 1507 02/07/2022 2344 Full Code [id number]  Bobette Mo, MD Inpatient   07/04/2018 0359 07/06/2018 1540 Full Code [id number]  Eduard Clos, MD ED   04/10/2017 0301 04/12/2017 1716 Full Code [id number]  Meredeth Ide, MD ED         IV Access:  Peripheral IV   Procedures and diagnostic studies:   DG Chest Portable 1 View  Result Date: 02/27/2023 CLINICAL DATA:  Provided history: Chest pain. EXAM: PORTABLE CHEST 1 VIEW COMPARISON:  Prior chest radiographs 10/13/2022 and earlier. Chest CT 04/06/2022. FINDINGS: Heart size within normal limits. Aortic atherosclerosis. Mitral annular calcifications.  Emphysema and findings suspicious for fibrotic lung disease were better appreciated on the prior high-resolution chest CT of 04/06/2022. No appreciable airspace consolidation or pulmonary edema. No evidence of pleural effusion or pneumothorax. No acute osseous abnormality identified. Chronic bilateral rib fracture deformities again noted. Partially imaged chronic fracture deformity of the proximal left humerus. Spondylosis and dextrocurvature of the thoracic spine. IMPRESSION: 1. No evidence of an acute cardiopulmonary abnormality. 2. Emphysema and findings suspicious for fibrotic lung disease were better appreciated on the prior high-resolution chest CT of 04/06/2022. Emphysema (ICD10-J43.9). 3. Mitral annular calcifications. 4. Aortic Atherosclerosis (ICD10-I70.0). Electronically Signed   By: Jackey Loge D.O.   On: 02/27/2023 14:51     Medical Consultants:   None.   Subjective:    [name] Steele feels better denies any chest pain or shortness of breath lightheadedness is improved.  Objective:    Vitals:   02/28/23 0248 02/28/23 0311 02/28/23 0320 02/28/23 0405  BP:   125/70 117/60  Pulse: (!) 58 (!) 56 64 (!) 53  Resp:   18 20  Temp:      TempSrc:    Oral  SpO2: 100% 100% 100% 100%  Weight:  58.7 kg    Height:       SpO2: 100 %   Intake/Output Summary (Last 24 hours) at 02/28/2023 0707 Last data filed at 02/28/2023 0626 Gross per 24 hour  Intake 77.57 ml  Output 500 ml  Net -422.43 ml   Filed Weights   02/27/23 1314 02/27/23 2120 02/28/23 0311  Weight: 58.5 kg 58.7 kg 58.7 kg    Exam: General exam: In no acute distress. Respiratory system: Good air movement and clear to auscultation. Cardiovascular system: S1 & S2 heard, RRR. No JVD. Gastrointestinal system: Abdomen is nondistended, soft and nontender.  Extremities: No pedal edema. Skin: No rashes, lesions or ulcers Psychiatry: Judgement and insight appear normal. Mood & affect appropriate.    Data Reviewed:     Labs: Basic Metabolic Panel: Recent Labs  Lab 02/27/23 1323 02/28/23 0447  NA 141 140  K 4.4 3.6  CL 101 102  CO2 27 25  GLUCOSE 122* 96  BUN 28* 27*  CREATININE 1.68* 1.56*  CALCIUM 9.6 9.3   GFR Estimated Creatinine Clearance: 30.3 mL/min (A) (by C-G formula based on SCr of 1.56 mg/dL (H)). Liver Function Tests: No results for input(s): "AST", "ALT", "ALKPHOS", "BILITOT", "PROT", "ALBUMIN" in the last 168 hours. No results for input(s): "LIPASE", "AMYLASE" in the last 168 hours. No results for input(s): "AMMONIA" in the last 168 hours. Coagulation profile No results for input(s): "INR", "PROTIME" in the last 168 hours. COVID-19 Labs  No results for input(s): "DDIMER", "FERRITIN", "LDH", "CRP" in the last 72 hours.  No results found for: "SARSCOV2NAA"  CBC: Recent Labs  Lab 02/27/23 1323  WBC 5.7  NEUTROABS 3.6  HGB 13.7  HCT 42.0  MCV 102.2*  PLT 280   Cardiac Enzymes: No results for input(s): "CKTOTAL", "CKMB", "CKMBINDEX", "TROPONINI" in the last 168 hours. BNP (last 3 results) No results for input(s): "PROBNP" in the last 8760 hours. CBG: No results for input(s): "GLUCAP" in the last 168 hours. D-Dimer: No results for input(s): "  DDIMER" in the last 72 hours. Hgb A1c: No results for input(s): "HGBA1C" in the last 72 hours. Lipid Profile: No results for input(s): "CHOL", "HDL", "LDLCALC", "TRIG", "CHOLHDL", "LDLDIRECT" in the last 72 hours. Thyroid function studies: No results for input(s): "TSH", "T4TOTAL", "T3FREE", "THYROIDAB" in the last 72 hours.  Invalid input(s): "FREET3" Anemia work up: No results for input(s): "VITAMINB12", "FOLATE", "FERRITIN", "TIBC", "IRON", "RETICCTPCT" in the last 72 hours. Sepsis Labs: Recent Labs  Lab 02/27/23 1323  WBC 5.7   Microbiology No results found for this or any previous visit (from the past 240 hour(s)).   Medications:    aspirin EC  81 mg Oral Daily   clopidogrel  75 mg Oral Daily   furosemide   80 mg Oral Daily   metoprolol succinate  12.5 mg Oral Daily   rosuvastatin  20 mg Oral Daily   sodium chloride flush  3 mL Intravenous Q12H   spironolactone  12.5 mg Oral Daily   Continuous Infusions:  heparin 650 Units/hr (02/28/23 0126)      LOS: 1 day   Marinda Elk  [facility]  02/28/2023, 7:07 AM

## 2023-02-28 NOTE — Evaluation (Signed)
Physical Therapy Evaluation Patient Details Name: [name] Steele MRN: [id number] DOB: 08/15/1940 Today's Date: 02/28/2023  History of Present Illness  Pt is 82 year old presented to [facility] on 02/27/23 for chest pain and near syncope after taking NTG. PMH - CHF, CAD, interstitial lung disease, CKD, DM, HTN, gout, osteoporosis.  Clinical Impression  Patient presents with mobility limited by decreased activity tolerance, decreased balance and hypersensitivity in feet.  Initially standing and walking to bathroom with slipper socks demonstrated antalgia and poor balance needing min A; returned to EOB to don shoes and pt much improved tolerance and balance ambulated in hallway with cane and S to CGA.  Feel he will continue to benefit from skilled PT in the acute setting, though likely will not need follow up PT at d/c.         If plan is discharge home, recommend the following: A little help with walking and/or transfers;Assistance with cooking/housework;A little help with bathing/dressing/bathroom;Help with stairs or ramp for entrance   Can travel by private vehicle        Equipment Recommendations None recommended by PT  Recommendations for Other Services       Functional Status Assessment Patient has had a recent decline in their functional status and demonstrates the ability to make significant improvements in function in a reasonable and predictable amount of time.     Precautions / Restrictions Precautions Precautions: Fall Precaution Comments: watch BP      Mobility  Bed Mobility Overal bed mobility: Needs Assistance Bed Mobility: Supine to Sit, Sit to Supine     Supine to sit: Contact guard, HOB elevated, Used rails Sit to supine: Min assist   General bed mobility comments: up to EOB assist for balance and lines, to supine assist for legs onto bed and repositioning    Transfers Overall transfer level: Needs assistance Equipment used: Straight cane Transfers: Sit  to/from Stand Sit to Stand: Contact guard assist, Min assist           General transfer comment: initially min A due to pain in feet and pt unsteady, after donning shoes with CGA for safety    Ambulation/Gait Ambulation/Gait assistance: Min assist, Supervision, Contact guard assist Gait Distance (Feet): 200 Feet (&12'x2) Assistive device: Straight cane Gait Pattern/deviations: Step-through pattern, Decreased stride length, Wide base of support, Antalgic       General Gait Details: initially wearing slipper socks pt reported pain in feet and unsteady walking to bathroom needing min A; donned shoes for hallway ambulation and pt with S to CGA for walking in hallway  Stairs            Wheelchair Mobility     Tilt Bed    Modified Rankin (Stroke Patients Only)       Balance Overall balance assessment: Needs assistance   Sitting balance-Leahy Scale: Good       Standing balance-Leahy Scale: Fair Standing balance comment: initially poor due to pain in feet, wearing shoes, static balance unaided                             Pertinent Vitals/Pain Pain Assessment Pain Assessment: Faces Faces Pain Scale: Hurts even more Pain Location: feet when walking wearing slipper socks Pain Descriptors / Indicators: Discomfort, Grimacing Pain Intervention(s): Monitored during session, Repositioned (changed footwear)    Home Living Family/patient expects to be discharged to:: Private residence Living Arrangements: Spouse/significant other Available Help at Discharge: Family;Available 24 hours/day  Type of Home: House Home Access: Stairs to enter Entrance Stairs-Rails: None Entrance Stairs-Number of Steps: 2 small steps   Home Layout: One level Home Equipment: Hand held shower head;Grab bars - tub/shower;Rollator (4 wheels);Cane - single point      Prior Function                       Extremity/Trunk Assessment        Lower Extremity  Assessment Lower Extremity Assessment: Generalized weakness (hypersensitivity in feet)    Cervical / Trunk Assessment Cervical / Trunk Assessment: Kyphotic  Communication   Communication Communication: Hearing impairment  Cognition Arousal: Alert Behavior During Therapy: WFL for tasks assessed/performed Overall Cognitive Status: Within Functional Limits for tasks assessed                                 General Comments: not formally tested, pt very HOH        General Comments General comments (skin integrity, edema, etc.): VSS after ambulation on RA, spouse in the room and supportive    Exercises     Assessment/Plan    PT Assessment Patient needs continued PT services  PT Problem List Decreased balance;Decreased mobility;Decreased activity tolerance;Decreased knowledge of use of DME;Decreased strength       PT Treatment Interventions DME instruction;Gait training;Functional mobility training;Therapeutic activities;Therapeutic exercise;Balance training;Patient/family education    PT Goals (Current goals can be found in the Care Plan section)  Acute Rehab PT Goals Patient Stated Goal: return home PT Goal Formulation: With patient/family Time For Goal Achievement: 03/14/23 Potential to Achieve Goals: Good    Frequency Min 1X/week     Co-evaluation               AM-PAC PT "6 Clicks" Mobility  Outcome Measure Help needed turning from your back to your side while in a flat bed without using bedrails?: A Little Help needed moving from lying on your back to sitting on the side of a flat bed without using bedrails?: A Little Help needed moving to and from a bed to a chair (including a wheelchair)?: A Little Help needed standing up from a chair using your arms (e.g., wheelchair or bedside chair)?: A Little Help needed to walk in hospital room?: A Little Help needed climbing 3-5 steps with a railing? : Total 6 Click Score: 16    End of Session  Equipment Utilized During Treatment: Gait belt Activity Tolerance: Patient tolerated treatment well Patient left: in bed;with call bell/phone within reach;with family/visitor present   PT Visit Diagnosis: Muscle weakness (generalized) (M62.81)    Time: 4098-1191 PT Time Calculation (min) (ACUTE ONLY): 27 min   Charges:   PT Evaluation $PT Eval Moderate Complexity: 1 Mod PT Treatments $Gait Training: 8-22 mins PT General Charges $$ ACUTE PT VISIT: 1 Visit         Sheran Lawless, PT Acute Rehabilitation Services [phone] 02/28/2023   [name] Steele 02/28/2023, 4:22 PM

## 2023-02-28 NOTE — Plan of Care (Signed)
  Problem: Activity: Goal: Ability to tolerate increased activity will improve Outcome: Progressing   Problem: Cardiac: Goal: Ability to achieve and maintain adequate cardiovascular perfusion will improve Outcome: Progressing   Problem: Activity: Goal: Ability to return to baseline activity level will improve Outcome: Progressing   Problem: Education: Goal: Knowledge of General Education information will improve Description: Including pain rating scale, medication(s)/side effects and non-pharmacologic comfort measures Outcome: Progressing   Problem: Health Behavior/Discharge Planning: Goal: Ability to manage health-related needs will improve Outcome: Progressing   Problem: Clinical Measurements: Goal: Ability to maintain clinical measurements within normal limits will improve Outcome: Progressing Goal: Will remain free from infection Outcome: Progressing Goal: Diagnostic test results will improve Outcome: Progressing Goal: Respiratory complications will improve Outcome: Progressing Goal: Cardiovascular complication will be avoided Outcome: Progressing

## 2023-03-01 ENCOUNTER — Other Ambulatory Visit: Payer: Self-pay | Admitting: Student

## 2023-03-01 DIAGNOSIS — I25110 Atherosclerotic heart disease of native coronary artery with unstable angina pectoris: Secondary | ICD-10-CM | POA: Diagnosis not present

## 2023-03-01 DIAGNOSIS — I2583 Coronary atherosclerosis due to lipid rich plaque: Secondary | ICD-10-CM

## 2023-03-01 DIAGNOSIS — I951 Orthostatic hypotension: Secondary | ICD-10-CM | POA: Diagnosis not present

## 2023-03-01 DIAGNOSIS — I5023 Acute on chronic systolic (congestive) heart failure: Secondary | ICD-10-CM

## 2023-03-01 DIAGNOSIS — R7989 Other specified abnormal findings of blood chemistry: Secondary | ICD-10-CM | POA: Diagnosis not present

## 2023-03-01 DIAGNOSIS — I255 Ischemic cardiomyopathy: Secondary | ICD-10-CM | POA: Diagnosis not present

## 2023-03-01 DIAGNOSIS — I200 Unstable angina: Secondary | ICD-10-CM

## 2023-03-01 DIAGNOSIS — I259 Chronic ischemic heart disease, unspecified: Secondary | ICD-10-CM | POA: Diagnosis not present

## 2023-03-01 LAB — CBC
HCT: 37.9 % — ABNORMAL LOW (ref 39.0–52.0)
Hemoglobin: 12.4 g/dL — ABNORMAL LOW (ref 13.0–17.0)
MCH: 33.1 pg (ref 26.0–34.0)
MCHC: 32.7 g/dL (ref 30.0–36.0)
MCV: 101.1 fL — ABNORMAL HIGH (ref 80.0–100.0)
Platelets: 234 10*3/uL (ref 150–400)
RBC: 3.75 MIL/uL — ABNORMAL LOW (ref 4.22–5.81)
RDW: 13.4 % (ref 11.5–15.5)
WBC: 5.3 10*3/uL (ref 4.0–10.5)
nRBC: 0 % (ref 0.0–0.2)

## 2023-03-01 LAB — BASIC METABOLIC PANEL
Anion gap: 11 (ref 5–15)
BUN: 28 mg/dL — ABNORMAL HIGH (ref 8–23)
CO2: 23 mmol/L (ref 22–32)
Calcium: 9 mg/dL (ref 8.9–10.3)
Chloride: 103 mmol/L (ref 98–111)
Creatinine, Ser: 1.42 mg/dL — ABNORMAL HIGH (ref 0.61–1.24)
GFR, Estimated: 49 mL/min — ABNORMAL LOW (ref >60)
Glucose, Bld: 147 mg/dL — ABNORMAL HIGH (ref 70–99)
Potassium: 3.6 mmol/L (ref 3.5–5.1)
Sodium: 137 mmol/L (ref 135–145)

## 2023-03-01 LAB — HEPARIN LEVEL (UNFRACTIONATED): Heparin Unfractionated: 0.33 [IU]/mL (ref 0.30–0.70)

## 2023-03-01 NOTE — Progress Notes (Signed)
Ordered outpatient BMET to be checked in 1 week per Dr. Mayford Knife. Please see her rounding note from today for more information.  Corrin Parker, PA-C 03/01/2023 10:28 AM

## 2023-03-01 NOTE — Progress Notes (Signed)
Rounding Note    Patient Name: [name] Steele Date of Encounter: 03/01/2023  [facility] Cardiologist: Donato Schultz, MD   Subjective   Has had problems with orthostasis during admission.  Denies any dizziness, CP or SOB. BP 134/8mmHg today.  Inpatient Medications    Scheduled Meds:  aspirin EC  81 mg Oral Daily   clopidogrel  75 mg Oral Daily   metoprolol succinate  12.5 mg Oral Daily   ranolazine  500 mg Oral BID   rosuvastatin  20 mg Oral Daily   sodium chloride flush  3 mL Intravenous Q12H   spironolactone  12.5 mg Oral Daily   Continuous Infusions:  heparin 650 Units/hr (03/01/23 0004)   PRN Meds: acetaminophen **OR** acetaminophen, albuterol, ipratropium, nitroGLYCERIN, ondansetron **OR** ondansetron (ZOFRAN) IV   Vital Signs    Vitals:   02/28/23 1442 02/28/23 1600 02/28/23 2000 03/01/23 0432  BP: 109/70 95/63 (!) 103/59 90/63  Pulse: 64 70 64 70  Resp: 18  20 16   Temp: 97.6 F (36.4 C)   97.6 F (36.4 C)  TempSrc: Oral   Oral  SpO2: 100% 100% 100% 99%  Weight:    60.4 kg  Height:        Intake/Output Summary (Last 24 hours) at 03/01/2023 0935 Last data filed at 03/01/2023 0435 Gross per 24 hour  Intake 58.5 ml  Output 200 ml  Net -141.5 ml      03/01/2023    4:32 AM 02/28/2023    3:11 AM 02/27/2023    9:20 PM  Last 3 Weights  Weight (lbs) 133 lb 2.5 oz 129 lb 6.6 oz 129 lb 6.4 oz  Weight (kg) 60.4 kg 58.7 kg 58.695 kg      Telemetry    NSR- Personally Reviewed  ECG    No new- Personally Reviewed  Physical Exam   Vitals:   02/28/23 2000 03/01/23 0432  BP: (!) 103/59 90/63  Pulse: 64 70  Resp: 20 16  Temp:  97.6 F (36.4 C)  SpO2: 100% 99%    GEN: thin cachectic appearing HEENT: Normal NECK: No JVD; No carotid bruits LYMPHATICS: No lymphadenopathy CARDIAC:RRR, no murmurs, rubs, gallops RESPIRATORY:  Clear to auscultation without rales, wheezing or rhonchi  ABDOMEN: Soft, non-tender,  non-distended MUSCULOSKELETAL:  No edema; No deformity  SKIN: Warm and dry NEUROLOGIC:  Alert and oriented x 3 PSYCHIATRIC:  Normal affect  Labs    High Sensitivity Troponin:   Recent Labs  Lab 02/27/23 1323 02/27/23 1517  TROPONINIHS 38* 25*     Chemistry Recent Labs  Lab 02/27/23 1323 02/28/23 0447  NA 141 140  K 4.4 3.6  CL 101 102  CO2 27 25  GLUCOSE 122* 96  BUN 28* 27*  CREATININE 1.68* 1.56*  CALCIUM 9.6 9.3  GFRNONAA 40* 44*  ANIONGAP 13 13    Lipids No results for input(s): "CHOL", "TRIG", "HDL", "LABVLDL", "LDLCALC", "CHOLHDL" in the last 168 hours.  Hematology Recent Labs  Lab 02/27/23 1323 03/01/23 0541  WBC 5.7 5.3  RBC 4.11* 3.75*  HGB 13.7 12.4*  HCT 42.0 37.9*  MCV 102.2* 101.1*  MCH 33.3 33.1  MCHC 32.6 32.7  RDW 13.2 13.4  PLT 280 234   Thyroid No results for input(s): "TSH", "FREET4" in the last 168 hours.  BNP Recent Labs  Lab 02/27/23 1323  BNP 354.9*    DDimer No results for input(s): "DDIMER" in the last 168 hours.   Radiology    DG Chest Portable  1 View  Result Date: 02/27/2023 CLINICAL DATA:  Provided history: Chest pain. EXAM: PORTABLE CHEST 1 VIEW COMPARISON:  Prior chest radiographs 10/13/2022 and earlier. Chest CT 04/06/2022. FINDINGS: Heart size within normal limits. Aortic atherosclerosis. Mitral annular calcifications. Emphysema and findings suspicious for fibrotic lung disease were better appreciated on the prior high-resolution chest CT of 04/06/2022. No appreciable airspace consolidation or pulmonary edema. No evidence of pleural effusion or pneumothorax. No acute osseous abnormality identified. Chronic bilateral rib fracture deformities again noted. Partially imaged chronic fracture deformity of the proximal left humerus. Spondylosis and dextrocurvature of the thoracic spine. IMPRESSION: 1. No evidence of an acute cardiopulmonary abnormality. 2. Emphysema and findings suspicious for fibrotic lung disease were better  appreciated on the prior high-resolution chest CT of 04/06/2022. Emphysema (ICD10-J43.9). 3. Mitral annular calcifications. 4. Aortic Atherosclerosis (ICD10-I70.0). Electronically Signed   By: Jackey Loge D.O.   On: 02/27/2023 14:51    Cardiac Studies   TTE 10/05/2022  1. Presence of apical swirling/sludge with definity contrast without  evidence of formed thrombus. Left ventricular ejection fraction, by  estimation, is 20 to 25%. The left ventricle has severely decreased  function. The left ventricle demonstrates global   hypokinesis. Left ventricular diastolic parameters are consistent with  Grade II diastolic dysfunction (pseudonormalization).   2. Right ventricular systolic function is normal. The right ventricular  size is normal. There is moderately elevated pulmonary artery systolic  pressure. The estimated right ventricular systolic pressure is 59.1 mmHg.   3. The mitral valve is grossly normal. Trivial mitral valve  regurgitation.   4. Tricuspid valve regurgitation is mild to moderate.   5. The aortic valve is calcified. There is moderate calcification of the  aortic valve. There is moderate thickening of the aortic valve. Aortic  valve regurgitation is mild. Mild aortic valve stenosis. Aortic valve  area, by VTI measures 1.11 cm. Aortic  valve mean gradient measures 10.0 mmHg. Aortic valve Vmax measures 2.06  m/s.   6. The inferior vena cava is dilated in size with <50% respiratory  variability, suggesting right atrial pressure of 15 mmHg.   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 Formal echo 10/05/22 showed EF 20-25%. Declined CABG and palliative was recommended who is being seen 02/27/2023 for the evaluation of CP at the request of Dr. Rosalia Hammers.   Assessment & Plan    Unstable Angina/ASCAD -He has known critical LM and this was discussed in May 2024. He was felt too high risk/ declined CABG with age.  PCI was not attempted 2/2 recovery concerns per the documentation. He required an IABP at that time.  -Palliative care was recommended.  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   -no diuretics except for spir  No further recs at this time  [facility] will sign off.   Medication Recommendations:  ASA 81mg  daily, Plavix 75mg  daily, Ranexa 500mg  BID, Toprol XL 12.5mg  daily, spiro 12.5mg  daily, Lasix 40mg  daily and Crestor 20mg  daily Other recommendations (labs, testing, etc):  BMET 1 week Follow up as an outpatient:  Dr. Anne Fu 2 weeks   Total time spent with patient today 35 minutes. This includes reviewing records, evaluating the patient and coordinating care. Face-to-face time >50%.  For questions or updates, please contact [facility] Please consult [email or web address] for contact info under        Signed, Armanda Magic, MD  03/01/2023, 9:35 AM

## 2023-03-01 NOTE — Progress Notes (Signed)
Redressed and flushed LEFT ACE SITE.

## 2023-03-01 NOTE — Progress Notes (Signed)
[facility] PROGRESS NOTE    Progress Note  [name] Steele  WJX:[id number] DOB: Nov 08, 1940 DOA: 02/27/2023 PCP: [name], MD     Brief Narrative:   [name] Steele is an 82 y.o. 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 Cardiology was consulted recommended to continue IV heparin for 48 hours continue aspirin, Plavix, Crestor, Toprol and nitroglycerin as needed Cardiac biomarkers less than 40 and improving.  Near syncope: PT OT consulted. Orthostatics are pending.  Ischemic cardiomyopathy/acute systolic and diastolic  heart failure: Last 2D echo that showed an EF of 20 to 25% grade 2 diastolic dysfunction. Chest x-ray showed no pulmonary edema. Lasix was stopped as he was reporting lightheadedness upon standing. Continue Aldactone. Currently a DNR/DNI. Not aggressive with GDMT  Chronic kidney see stage IIIb: With a baseline creatinine of 1.3-1.5 started on IV Lasix this morning is 1.5. Continue to monitor.  Diabetes mellitus type 2 with long-term  insulin use: A1c of 5.3, holding metformin. Blood glucose well-controlled may need to go home off metformin.  Hyperlipidemia: Continue statins.  DVT prophylaxis: heparin Family Communication:wife Status is: Inpatient Remains inpatient appropriate because: Acute coronary syndrome    Code Status:     Code Status Orders  (From admission, onward)           Start     Ordered   02/27/23 1741  Do not attempt resuscitation (DNR) Pre-Arrest Interventions Desired  Continuous       Question Answer Comment  If pulseless and not breathing No CPR or chest compressions.   In Pre-Arrest Conditions (Patient Has Pulse and Is Breathing) May intubate, use advanced airway interventions and cardioversion/ACLS medications if appropriate or indicated. May transfer to ICU.   Consent: Discussion documented in EHR or advanced directives reviewed      02/27/23 1742           Code Status History     Date Active Date Inactive Code Status Order ID Comments User Context   10/13/2022 1432 10/16/2022 2315 DNR [id number]  Arty Baumgartner, NP ED   10/06/2022 1105 10/10/2022 1806 DNR [id number]  Runell Gess, MD Inpatient   10/05/2022 0434 10/06/2022 1105 Full Code [id number]  Hermelinda Dellen, MD ED   02/06/2022 1507 02/07/2022 2344 Full Code [id number]  Bobette Mo, MD Inpatient   07/04/2018 0359 07/06/2018 1540 Full Code [id number]  Eduard Clos, MD ED   04/10/2017 0301 04/12/2017 1716 Full Code [id number]  Meredeth Ide, MD ED         IV Access:   Peripheral IV   Procedures and diagnostic studies:   DG Chest Portable  1 View  Result Date: 02/27/2023 CLINICAL DATA:  Provided history: Chest pain. EXAM: PORTABLE CHEST 1 VIEW COMPARISON:  Prior chest radiographs 10/13/2022 and earlier. Chest CT 04/06/2022. FINDINGS: Heart size within normal limits. Aortic atherosclerosis. Mitral annular calcifications. Emphysema and findings suspicious for fibrotic lung disease were better  appreciated on the prior high-resolution chest CT of 04/06/2022. No appreciable airspace consolidation or pulmonary edema. No evidence of pleural effusion or pneumothorax. No acute osseous abnormality identified. Chronic bilateral rib fracture deformities again noted. Partially imaged chronic fracture deformity of the proximal left humerus. Spondylosis and dextrocurvature of the thoracic spine. IMPRESSION: 1. No evidence of an acute cardiopulmonary abnormality. 2. Emphysema and findings suspicious for fibrotic lung disease were better appreciated on the prior high-resolution chest CT of 04/06/2022. Emphysema (ICD10-J43.9). 3. Mitral annular calcifications. 4. Aortic Atherosclerosis (ICD10-I70.0). Electronically Signed   By: Jackey Loge D.O.   On: 02/27/2023 14:51     Medical Consultants:   None.   Subjective:    [name] had a good mood this morning.  Objective:    Vitals:   02/28/23 1442 02/28/23 1600 02/28/23 2000 03/01/23 0432  BP: 109/70 95/63 (!) 103/59 90/63  Pulse: 64 70 64 70  Resp: 18  20 16   Temp: 97.6 F (36.4 C)   97.6 F (36.4 C)  TempSrc: Oral   Oral  SpO2: 100% 100% 100% 99%  Weight:    60.4 kg  Height:       SpO2: 99 %   Intake/Output Summary (Last 24 hours) at 03/01/2023 0855 Last data filed at 03/01/2023 0435 Gross per 24 hour  Intake 58.5 ml  Output 200 ml  Net -141.5 ml   Filed Weights   02/27/23 2120 02/28/23 0311 03/01/23 0432  Weight: 58.7 kg 58.7 kg 60.4 kg    Exam: General exam: In no acute distress. Respiratory system: Good air movement and clear to auscultation. Cardiovascular system: S1 & S2 heard, RRR. No JVD. Gastrointestinal system: Abdomen is nondistended, soft and nontender.  Extremities: No pedal edema. Skin: No rashes, lesions or ulcers Psychiatry: Judgement and insight appear normal. Mood & affect appropriate.   Data Reviewed:    Labs: Basic Metabolic Panel: Recent Labs  Lab 02/27/23 1323 02/28/23 0447  NA 141  140  K 4.4 3.6  CL 101 102  CO2 27 25  GLUCOSE 122* 96  BUN 28* 27*  CREATININE 1.68* 1.56*  CALCIUM 9.6 9.3   GFR Estimated Creatinine Clearance: 31.2 mL/min (A) (by C-G formula based on SCr of 1.56 mg/dL (H)). Liver Function Tests: No results for input(s): "AST", "ALT", "ALKPHOS", "BILITOT", "PROT", "ALBUMIN" in the last 168 hours. No results for input(s): "LIPASE", "AMYLASE" in the last 168 hours. No results for input(s): "AMMONIA" in the last 168 hours. Coagulation profile No results for input(s): "INR", "PROTIME" in the last 168 hours. COVID-19 Labs  No results for input(s): "DDIMER", "FERRITIN", "LDH", "CRP" in the last 72 hours.  No results found for: "SARSCOV2NAA"  CBC: Recent Labs  Lab 02/27/23 1323 03/01/23 0541  WBC 5.7 5.3  NEUTROABS 3.6  --   HGB 13.7 12.4*  HCT 42.0 37.9*  MCV 102.2* 101.1*  PLT 280 234   Cardiac Enzymes: No results for input(s): "CKTOTAL", "CKMB", "CKMBINDEX", "TROPONINI" in the last 168 hours. BNP (last 3 results) No results for input(s): "PROBNP" in the last 8760 hours. CBG: No results for input(s): "GLUCAP" in the last 168 hours. D-Dimer: No results for input(s): "DDIMER" in the last 72 hours. Hgb  A1c: No results for input(s): "HGBA1C" in the last 72 hours. Lipid Profile: No results for input(s): "CHOL", "HDL", "LDLCALC", "TRIG", "CHOLHDL", "LDLDIRECT" in the last 72 hours. Thyroid function studies: No results for input(s): "TSH", "T4TOTAL", "T3FREE", "THYROIDAB" in the last 72 hours.  Invalid input(s): "FREET3" Anemia work up: No results for input(s): "VITAMINB12", "FOLATE", "FERRITIN", "TIBC", "IRON", "RETICCTPCT" in the last 72 hours. Sepsis Labs: Recent Labs  Lab 02/27/23 1323 03/01/23 0541  WBC 5.7 5.3   Microbiology No results found for this or any previous visit (from the past 240 hour(s)).   Medications:    aspirin EC  81 mg Oral Daily   clopidogrel  75 mg Oral Daily   metoprolol succinate  12.5 mg Oral  Daily   ranolazine  500 mg Oral BID   rosuvastatin  20 mg Oral Daily   sodium chloride flush  3 mL Intravenous Q12H   spironolactone  12.5 mg Oral Daily   Continuous Infusions:  heparin 650 Units/hr (03/01/23 0004)      LOS: 2 days   Marinda Elk  [facility]  03/01/2023, 8:55 AM

## 2023-03-01 NOTE — Progress Notes (Signed)
ANTICOAGULATION CONSULT NOTE   Pharmacy Consult for Heparin Indication: chest pain/ACS  Allergies  Allergen Reactions   Ak-Mycin [Erythromycin] Other (See Comments)    Severe Abdominal Pain   Penicillins Hives    Patient Measurements: Height: 5\' 10"  (177.8 cm) Weight: 60.4 kg (133 lb 2.5 oz) IBW/kg (Calculated) : 73 Heparin Dosing Weight: 58.5 kg  Vital Signs: Temp: 97.6 F (36.4 C) (10/14 0432) Temp Source: Oral (10/14 0432) BP: 136/84 (10/14 0900) Pulse Rate: 88 (10/14 0900)  Labs: Recent Labs    02/27/23 1323 02/27/23 1517 02/27/23 2307 02/28/23 0447 02/28/23 0746 03/01/23 0541  HGB 13.7  --   --   --   --  12.4*  HCT 42.0  --   --   --   --  37.9*  PLT 280  --   --   --   --  234  HEPARINUNFRC  --   --    < > 0.56 0.61 0.33  CREATININE 1.68*  --   --  1.56*  --   --   TROPONINIHS 38* 25*  --   --   --   --    < > = values in this interval not displayed.    Estimated Creatinine Clearance: 31.2 mL/min (A) (by C-G formula based on SCr of 1.56 mg/dL (H)).   Medical History: Past Medical History:  Diagnosis Date   Arthritis    CAD (coronary artery disease) 10/05/2022   Cath 09/2022: 3v CAD, critical LM stenosis - Pt opted for med Rx only>>DNR   Chronic kidney disease, stage 3a (HCC)    Chronic urinary tract infection    Diabetes mellitus without complication (HCC)    Gout    Heart murmur    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   S/p non-STEMI 09/2022>> three-vessel and critical left main stenosis; patient opted for medical management // TTE 09/2022: EF 20-25, GR 2 DD, RVSP 59.1, trivial MR, mild-moderate TR, mild aortic stenosis, mean gradient 10, V-max 206 cm/s, RAP 15   History of non-ST elevation myocardial infarction (NSTEMI) 10/05/2022   Hypertension    ILD (interstitial lung disease) (HCC)    by CT 01/2021   Ischemic cardiomyopathy 10/10/2022   Mild aortic stenosis    Mild dilation of ascending aorta (HCC)     Osteoporosis     Medications:  Medications Prior to Admission  Medication Sig Dispense Refill Last Dose   aspirin EC 81 MG tablet Take 1 tablet (81 mg total) by mouth daily. Swallow whole. 90 tablet 3 02/27/2023   clopidogrel (PLAVIX) 75 MG tablet Take 1 tablet (75 mg total) by mouth daily. 90 tablet 3 02/26/2023   CRANBERRY PO Take 1 capsule by mouth daily.   02/26/2023   furosemide (LASIX) 40 MG tablet Take 1 tablet (40 mg total) by mouth as needed (Take as directed by your sliding scale instructions noted on discharge summary.). 90 tablet 3 NEVER   furosemide (LASIX) 80 MG tablet Take 1 tablet (80 mg total) by mouth daily at 12 noon. 90 tablet 6 02/26/2023   ipratropium (ATROVENT) 0.06 % nasal spray Place 2 sprays into both nostrils 3 (three) times daily as needed for rhinitis.   UNK   metFORMIN (GLUCOPHAGE-XR) 750 MG 24 hr tablet Take 750 mg by mouth daily after breakfast.   02/26/2023   metoprolol succinate (TOPROL-XL) 25 MG 24 hr tablet Take 0.5 tablets (12.5 mg total) by mouth daily. 90 tablet 3 02/26/2023   nitroGLYCERIN (  NITROSTAT) 0.4 MG SL tablet Place 1 tablet (0.4 mg total) under the tongue every 5 (five) minutes x 3 doses as needed for chest pain. 25 tablet 3 02/27/2023   ranolazine (RANEXA) 500 MG 12 hr tablet Take 1 tablet (500 mg total) by mouth 2 (two) times daily. 180 tablet 3 02/26/2023   rosuvastatin (CRESTOR) 20 MG tablet Take 1 tablet (20 mg total) by mouth daily. 90 tablet 3 02/26/2023   spironolactone (ALDACTONE) 25 MG tablet Take 0.5 tablets (12.5 mg total) by mouth daily. 45 tablet 3 02/26/2023   Scheduled:   aspirin EC  81 mg Oral Daily   clopidogrel  75 mg Oral Daily   metoprolol succinate  12.5 mg Oral Daily   ranolazine  500 mg Oral BID   rosuvastatin  20 mg Oral Daily   sodium chloride flush  3 mL Intravenous Q12H   spironolactone  12.5 mg Oral Daily   Infusions:   heparin Stopped (03/01/23 0944)   PRN: acetaminophen **OR** acetaminophen, albuterol,  ipratropium, nitroGLYCERIN, ondansetron **OR** ondansetron (ZOFRAN) IV  Assessment: 82 yom with a history of ILD, NSTEMI 09/2022 with critical LM stenosis and severe prox LAD and OM stenoses and declined CABG, new HFrEF (EF 20%) 09/2022. Patient is presenting with chest pain. Heparin per pharmacy consult placed for chest pain/ACS.  Patient is not on anticoagulation prior to arrival.  10/14 AM: Heparin level 0.33 is therapeutic on 650 units/hr.  No infusion issues or bleeding noted.  Hgb 12.4, pltc 234.  Noted per cardiology, will continue heparin for 48 hours.   Goal of Therapy:  Heparin level 0.3-0.7 units/ml Monitor platelets by anticoagulation protocol: Yes   Plan:  Continue heparin infusion at 650 units/hr - stop time placed for 1530 today (x48h) Monitor for s/sx of bleeding  Trixie Rude, PharmD Clinical Pharmacist 03/01/2023  10:54 AM

## 2023-03-02 DIAGNOSIS — N1832 Chronic kidney disease, stage 3b: Secondary | ICD-10-CM | POA: Diagnosis not present

## 2023-03-02 DIAGNOSIS — E118 Type 2 diabetes mellitus with unspecified complications: Secondary | ICD-10-CM | POA: Diagnosis not present

## 2023-03-02 DIAGNOSIS — I259 Chronic ischemic heart disease, unspecified: Secondary | ICD-10-CM | POA: Diagnosis not present

## 2023-03-02 DIAGNOSIS — R7989 Other specified abnormal findings of blood chemistry: Secondary | ICD-10-CM | POA: Diagnosis not present

## 2023-03-02 LAB — CBC
HCT: 36.1 % — ABNORMAL LOW (ref 39.0–52.0)
Hemoglobin: 11.7 g/dL — ABNORMAL LOW (ref 13.0–17.0)
MCH: 32.2 pg (ref 26.0–34.0)
MCHC: 32.4 g/dL (ref 30.0–36.0)
MCV: 99.4 fL (ref 80.0–100.0)
Platelets: 203 10*3/uL (ref 150–400)
RBC: 3.63 MIL/uL — ABNORMAL LOW (ref 4.22–5.81)
RDW: 13.2 % (ref 11.5–15.5)
WBC: 5.5 10*3/uL (ref 4.0–10.5)
nRBC: 0 % (ref 0.0–0.2)

## 2023-03-02 MED ORDER — FUROSEMIDE 80 MG PO TABS: 40.0000 mg | ORAL_TABLET | Freq: Every day | ORAL | 0 refills | Status: DC

## 2023-03-02 NOTE — Care Management Important Message (Signed)
Important Message  Patient Details  Name: Kabe Mckoy MRN: [id number] Date of Birth: 06-Nov-1940   Important Message Given:  Yes - Medicare IM     Sherilyn Banker 03/02/2023, 10:41 AM

## 2023-03-02 NOTE — Consult Note (Signed)
Value-Based Care [facility] Inpatient Consult   03/02/2023  [name] Steele 15-May-1941 [id number]  [facility] [[facility]]  Accountable Care Organization [ACO] Patient: Medicare ACO REACH  Primary Care Provider:  [name], MD with [facility] is listed to provide the transition of care follow up.  Patient is currently active with [facility] [[facility]] Care Management for care coordination services.  Patient has been engaged by a [employer].  The community based plan of care has focused on disease management and community resource support.    Patient will receive a post hospital call and will be evaluated for assessments and disease process education.   Rounded on patient at the bedside, wife and staff preparing patient for discharge. Updated inpatient [facility] RN of post hospital following, and patient set up with [facility] for [facility] needs.  Plan: Patient has an upcoming appointment with [facility] Coordinator 03/04/23 noted.  Will update team of transition home with [facility] planned for today.   Of note, [facility] Care Management services does not replace or interfere with any services that are needed or arranged by inpatient [facility] care management team.   Charlesetta Shanks, RN, BSN, CCM [facility], [facility] Liaison Direct Dial: [phone] or secure chat Website: [email or web address]

## 2023-03-02 NOTE — Plan of Care (Signed)

## 2023-03-02 NOTE — Progress Notes (Signed)
Physical Therapy Treatment Patient Details Name: [name] Steele MRN: [id number] DOB: 1940-10-26 Today's Date: 03/02/2023   History of Present Illness Pt is 82 year old presented to [facility] on 02/27/23 for chest pain and near syncope after taking NTG. PMH - CHF, CAD, interstitial lung disease, CKD, DM, HTN, gout, osteoporosis.    PT Comments  Pt admitted with above diagnosis. Pt was able to ambulate with RW with good stability. States he alternates between RW and cane at home. HHPT recommended to progress to cane as pt does well with bil UE support and will need practice to return to only use of 1 UE support.  Probable d/c today.  Pt currently with functional limitations due to the deficits listed below (see PT Problem List). Pt will benefit from acute skilled PT to increase their independence and safety with mobility to allow discharge.       If plan is discharge home, recommend the following: A little help with walking and/or transfers;Assistance with cooking/housework;Assist for transportation   Can travel by private vehicle        Equipment Recommendations  None recommended by PT    Recommendations for Other Services       Precautions / Restrictions Precautions Precautions: Fall Precaution Comments: watch BP Restrictions Weight Bearing Restrictions: No     Mobility  Bed Mobility Overal bed mobility: Needs Assistance Bed Mobility: Supine to Sit, Sit to Supine     Supine to sit: Contact guard, HOB elevated, Used rails Sit to supine: Contact guard assist   General bed mobility comments: up to EOB assist for balance and lines, to supine assist for legs onto bed and repositioning    Transfers Overall transfer level: Needs assistance Equipment used: Rolling walker (2 wheels) Transfers: Sit to/from Stand Sit to Stand: Contact guard assist, Min assist           General transfer comment: CGA for safety    Ambulation/Gait Ambulation/Gait assistance: Contact guard  assist Gait Distance (Feet): 250 Feet Assistive device: Rolling walker (2 wheels) Gait Pattern/deviations: Step-through pattern, Decreased stride length, Wide base of support, Antalgic   Gait velocity interpretation: <1.31 ft/sec, indicative of household ambulator   General Gait Details: donned shoes for hallway ambulation and pt with CGA for walking in hallway with use of RW   Stairs             Wheelchair Mobility     Tilt Bed    Modified Rankin (Stroke Patients Only)       Balance Overall balance assessment: Needs assistance Sitting-balance support: No upper extremity supported, Feet supported Sitting balance-Leahy Scale: Good     Standing balance support: Bilateral upper extremity supported, During functional activity, No upper extremity supported Standing balance-Leahy Scale: Fair Standing balance comment: needs UE support for dynamic gait                            Cognition Arousal: Alert Behavior During Therapy: WFL for tasks assessed/performed Overall Cognitive Status: Within Functional Limits for tasks assessed                                 General Comments: not formally tested, pt very [facility]        Exercises General Exercises - Lower Extremity Ankle Circles/Pumps: AROM, Both, 10 reps, Supine Long Arc Quad: AROM, Both, 10 reps, Seated    General Comments General comments (  skin integrity, edema, etc.): VSS after ambulation      Pertinent Vitals/Pain Pain Assessment Pain Assessment: Faces Faces Pain Scale: Hurts even more Pain Location: feet when walking wearing slipper socks Pain Descriptors / Indicators: Discomfort, Grimacing Pain Intervention(s): Limited activity within patient's tolerance, Monitored during session, Repositioned    Home Living                          Prior Function            PT Goals (current goals can now be found in the care plan section) Acute Rehab PT Goals Patient Stated  Goal: return home Progress towards PT goals: Progressing toward goals    Frequency    Min 1X/week      PT Plan      Co-evaluation              AM-PAC PT "6 Clicks" Mobility   Outcome Measure  Help needed turning from your back to your side while in a flat bed without using bedrails?: A Little Help needed moving from lying on your back to sitting on the side of a flat bed without using bedrails?: A Little Help needed moving to and from a bed to a chair (including a wheelchair)?: A Little Help needed standing up from a chair using your arms (e.g., wheelchair or bedside chair)?: A Little Help needed to walk in hospital room?: A Little Help needed climbing 3-5 steps with a railing? : A Lot 6 Click Score: 17    End of Session Equipment Utilized During Treatment: Gait belt Activity Tolerance: Patient tolerated treatment well Patient left: with call bell/phone within reach;in bed;with bed alarm set Nurse Communication: Mobility status PT Visit Diagnosis: Muscle weakness (generalized) (M62.81)     Time: 8657-8469 PT Time Calculation (min) (ACUTE ONLY): 14 min  Charges:    $Gait Training: 8-22 mins PT General Charges $$ ACUTE PT VISIT: 1 Visit                     [name] Steele M,PT Acute Rehab Services [phone]    Bevelyn Buckles 03/02/2023, 11:09 AM

## 2023-03-02 NOTE — Discharge Summary (Addendum)
Physician Discharge Summary  Lesslie Forest OVF:[id number] DOB: January 04, 1941 DOA: 02/27/2023  PCP: [name], MD  Admit date: 02/27/2023 Discharge date: 03/02/2023  Admitted From: Home Disposition:  Home  Recommendations for Outpatient Follow-up:  Follow up with PCP in 1-2 weeks Please obtain BMP/CBC in one week   Home Health:No Equipment/Devices:None  Discharge Condition:Guarded CODE STATUS:DNR Diet recommendation: Heart Healthy  Brief/Interim Summary: 82 y.o. male past medical history significant for essential hypertension, hyperlipidemia, chronic systolic heart failure with a last EF of 20 to 25% with a left heart cath in May 2024 that showed critical left main disease 95% stenosed and mid LAD stenosis 90%, had to be placed on intra-aortic balloon pump CT surgery was consulted and family ultimately decided against surgical intervention and stenting as they thought it would be too high risk, so treated medically, diabetes mellitus type 2 interstitial lung disease gout comes in complaining of chest pain and near syncope, he relates that on the morning of admission he woke up feeling dizzy chest pressure for which she took nitroglycerin improved, but continued felt lightheaded denies any loss of consciousness   Discharge Diagnoses:  Principal Problem:   Chest pain Active Problems:   Elevated troponin   Near syncope   Ischemic cardiomyopathy   CKD (chronic kidney disease) stage 3, GFR 30-59 ml/min (HCC)   Type 2 diabetes mellitus with complication, without long-term current use of insulin (HCC)   Hyperlipidemia   Coronary artery disease due to lipid rich plaque   Acute on chronic systolic heart failure (HCC)   Unstable angina (HCC)   Orthostatic hypotension  Unstable angina/coronary artery disease/elevated troponins: Patient with a known history of left main disease who is at high risk for CABG, therefore not attempted, PCI was not attempted on May 2024 as he is very high  risk. Cardiology was consulted recommended IV heparin for 48 hours continue on aspirin Plavix Crestor and Toprol nitroglycerin as needed. Cardiac biomarkers improved. Palliative care to address goals of care as an outpatient probably move to hospice as an outpatient.  Near syncope: PT OT was consulted will need home health PT.  Ischemic cardiomyopathy/acute systolic and diastolic heart failure: With a last 2D echo that showed an EF of 20 to 25% grade 2 diastolic dysfunction, chest x-ray showed no pulmonary edema.  He was started on IV diuresis. Lasix was held as he was dizzy upon standing. He is currently a DNR/DNI. No aggressive therapy with GDMT. He will go home on Lasix 40 mg daily follow-up with cardiology as an outpatient.  Chronic kidney see stage IIIb: His creatinine returned to baseline.  Diabetes mellitus type 2 with long-term insulin use: No changes made to his medication.  Hyperlipidemia: Continue statins.    Discharge Instructions  Discharge Instructions     Diet - low sodium heart healthy   Complete by: As directed    Increase activity slowly   Complete by: As directed       Allergies as of 03/02/2023       Reactions   Ak-mycin [erythromycin] Other (See Comments)   Severe Abdominal Pain   Penicillins Hives        Medication List     STOP taking these medications    CRANBERRY PO       TAKE these medications    aspirin EC 81 MG tablet Take 1 tablet (81 mg total) by mouth daily. Swallow whole.   clopidogrel 75 MG tablet Commonly known as: PLAVIX Take 1 tablet (75 mg total)  by mouth daily.   furosemide 40 MG tablet Commonly known as: LASIX Take 1 tablet (40 mg total) by mouth as needed (Take as directed by your sliding scale instructions noted on discharge summary.).   furosemide 80 MG tablet Commonly known as: LASIX Take 1 tablet (80 mg total) by mouth daily at 12 noon.   ipratropium 0.06 % nasal spray Commonly known as:  ATROVENT Place 2 sprays into both nostrils 3 (three) times daily as needed for rhinitis.   metFORMIN 750 MG 24 hr tablet Commonly known as: GLUCOPHAGE-XR Take 750 mg by mouth daily after breakfast.   metoprolol succinate 25 MG 24 hr tablet Commonly known as: TOPROL-XL Take 0.5 tablets (12.5 mg total) by mouth daily.   nitroGLYCERIN 0.4 MG SL tablet Commonly known as: NITROSTAT Place 1 tablet (0.4 mg total) under the tongue every 5 (five) minutes x 3 doses as needed for chest pain.   ranolazine 500 MG 12 hr tablet Commonly known as: RANEXA Take 1 tablet (500 mg total) by mouth 2 (two) times daily.   rosuvastatin 20 MG tablet Commonly known as: CRESTOR Take 1 tablet (20 mg total) by mouth daily.   spironolactone 25 MG tablet Commonly known as: ALDACTONE Take 0.5 tablets (12.5 mg total) by mouth daily.        Follow-up Information     Sharlene Dory, PA-C Follow up.   Specialty: Cardiology Why: Hospital follow-up with Cardiology scheduled for 03/16/2023 at 2:45pm. Please arrive 15 minutes early for check-in. If this date/ time does not work for you, please call our office to reschedule. Contact information: [address] [place] [address] [phone]         [facility] Follow up.   Specialty: Cardiology Why: Please come by Dr. Minerva Fester office on 03/08/2023 for repeat lab work. Your appointment is listed for 11:45am, but you can come by anytime from 8am to 4:30pm. Contact information: [address] [place] [address] [phone]               Allergies  Allergen Reactions   Ak-Mycin [Erythromycin] Other (See Comments)    Severe Abdominal Pain   Penicillins Hives    Consultations: Cardiology   Procedures/Studies: DG Chest Portable 1 View  Result Date: 02/27/2023 CLINICAL DATA:  Provided history: Chest pain. EXAM: PORTABLE CHEST 1 VIEW COMPARISON:  Prior chest radiographs  10/13/2022 and earlier. Chest CT 04/06/2022. FINDINGS: Heart size within normal limits. Aortic atherosclerosis. Mitral annular calcifications. Emphysema and findings suspicious for fibrotic lung disease were better appreciated on the prior high-resolution chest CT of 04/06/2022. No appreciable airspace consolidation or pulmonary edema. No evidence of pleural effusion or pneumothorax. No acute osseous abnormality identified. Chronic bilateral rib fracture deformities again noted. Partially imaged chronic fracture deformity of the proximal left humerus. Spondylosis and dextrocurvature of the thoracic spine. IMPRESSION: 1. No evidence of an acute cardiopulmonary abnormality. 2. Emphysema and findings suspicious for fibrotic lung disease were better appreciated on the prior high-resolution chest CT of 04/06/2022. Emphysema (ICD10-J43.9). 3. Mitral annular calcifications. 4. Aortic Atherosclerosis (ICD10-I70.0). Electronically Signed   By: Jackey Loge D.O.   On: 02/27/2023 14:51   (Echo, Carotid, EGD, Colonoscopy, ERCP)    Subjective: No complaints  Discharge Exam: Vitals:   03/02/23 0335 03/02/23 0836  BP: 99/63   Pulse: 66   Resp: 16   Temp: (!) 97.5 F (36.4 C) 98.1 F (36.7 C)  SpO2: 99% 99%  Vitals:   03/01/23 1407 03/01/23 1926 03/02/23 0335 03/02/23 0836  BP: 102/64 112/63 99/63   Pulse:  74 66   Resp: 16 16 16    Temp: 97.8 F (36.6 C) 97.7 F (36.5 C) (!) 97.5 F (36.4 C) 98.1 F (36.7 C)  TempSrc: Oral Oral Oral Oral  SpO2:  100% 99% 99%  Weight:   61.3 kg   Height:        General: Pt is alert, awake, not in acute distress Cardiovascular: RRR, S1/S2 +, no rubs, no gallops Respiratory: CTA bilaterally, no wheezing, no rhonchi Abdominal: Soft, NT, ND, bowel sounds + Extremities: no edema, no cyanosis    The results of significant diagnostics from this hospitalization (including imaging, microbiology, ancillary and laboratory) are listed below for reference.      Microbiology: No results found for this or any previous visit (from the past 240 hour(s)).   Labs: BNP (last 3 results) Recent Labs    10/13/22 0955 10/23/22 1538 02/27/23 1323  BNP 1,954.1* 2,600.7* 354.9*   Basic Metabolic Panel: Recent Labs  Lab 02/27/23 1323 02/28/23 0447 03/01/23 1413  NA 141 140 137  K 4.4 3.6 3.6  CL 101 102 103  CO2 27 25 23   GLUCOSE 122* 96 147*  BUN 28* 27* 28*  CREATININE 1.68* 1.56* 1.42*  CALCIUM 9.6 9.3 9.0   Liver Function Tests: No results for input(s): "AST", "ALT", "ALKPHOS", "BILITOT", "PROT", "ALBUMIN" in the last 168 hours. No results for input(s): "LIPASE", "AMYLASE" in the last 168 hours. No results for input(s): "AMMONIA" in the last 168 hours. CBC: Recent Labs  Lab 02/27/23 1323 03/01/23 0541 03/02/23 0614  WBC 5.7 5.3 5.5  NEUTROABS 3.6  --   --   HGB 13.7 12.4* 11.7*  HCT 42.0 37.9* 36.1*  MCV 102.2* 101.1* 99.4  PLT 280 234 203   Cardiac Enzymes: No results for input(s): "CKTOTAL", "CKMB", "CKMBINDEX", "TROPONINI" in the last 168 hours. BNP: Invalid input(s): "POCBNP" CBG: No results for input(s): "GLUCAP" in the last 168 hours. D-Dimer No results for input(s): "DDIMER" in the last 72 hours. Hgb A1c No results for input(s): "HGBA1C" in the last 72 hours. Lipid Profile No results for input(s): "CHOL", "HDL", "LDLCALC", "TRIG", "CHOLHDL", "LDLDIRECT" in the last 72 hours. Thyroid function studies No results for input(s): "TSH", "T4TOTAL", "T3FREE", "THYROIDAB" in the last 72 hours.  Invalid input(s): "FREET3" Anemia work up No results for input(s): "VITAMINB12", "FOLATE", "FERRITIN", "TIBC", "IRON", "RETICCTPCT" in the last 72 hours. Urinalysis    Component Value Date/Time   COLORURINE COLORLESS (A) 02/06/2022 1525   APPEARANCEUR CLEAR 02/06/2022 1525   LABSPEC 1.004 (L) 02/06/2022 1525   PHURINE 7.0 02/06/2022 1525   GLUCOSEU NEGATIVE 02/06/2022 1525   HGBUR NEGATIVE 02/06/2022 1525   BILIRUBINUR  NEGATIVE 02/06/2022 1525   KETONESUR NEGATIVE 02/06/2022 1525   PROTEINUR NEGATIVE 02/06/2022 1525   UROBILINOGEN 0.2 10/06/2008 1745   NITRITE NEGATIVE 02/06/2022 1525   LEUKOCYTESUR LARGE (A) 02/06/2022 1525   Sepsis Labs Recent Labs  Lab 02/27/23 1323 03/01/23 0541 03/02/23 0614  WBC 5.7 5.3 5.5   Microbiology No results found for this or any previous visit (from the past 240 hour(s)).   Time coordinating discharge: Over 35 minutes  SIGNED:   Marinda Elk, MD  [facility] 03/02/2023, 9:44 AM Pager   If 7PM-7AM, please contact night-coverage [email or web address] Password [email or web address]

## 2023-03-02 NOTE — Plan of Care (Signed)
Problem: Activity: Goal: Ability to tolerate increased activity will improve Outcome: Progressing

## 2023-03-02 NOTE — Progress Notes (Signed)
Discharge instructions reviewed with pt and his wife.  Copy of instructions given to pt. Pt and wife verbalized understanding and able to teach back instructions discussed.  Pt to be d/c'd via wheelchair with belongings, with wife.              To be escorted by staff/hospital volunteer.   Annice Needy, RN SWOT

## 2023-03-02 NOTE — TOC Initial Note (Signed)
Transition of Care [facility]) - Initial/Assessment Note    Patient Details  Name: [name] Steele MRN: [id number] Date of Birth: 1941/03/30  Transition of Care [facility]) CM/SW Contact:    Gala Lewandowsky, RN Phone Number: 03/02/2023, 11:18 AM  Clinical Narrative: Late Entry: Patient presented for chest pain. PTA patient was from home with spouse. Patient has DME: rolling walker, bedside commode, and cane. PT worked with the patient this morning and requested home health services- agreeable to [facility] has used in the past. Referral submitted and start of care to begin within 24-48 hours post transition home. Spouse at the bedside for transport home.                    Expected Discharge Plan: Home w Home Health Services Barriers to Discharge: No Barriers Identified   Patient Goals and CMS Choice Patient states their goals for this hospitalization and ongoing recovery are:: to return home   Choice offered to / list presented to : Patient, Spouse      Expected Discharge Plan and Services In-house Referral: NA Discharge Planning Services: CM Consult Post Acute Care Choice: Home Health Living arrangements for the past 2 months: Single Family Home Expected Discharge Date: 03/02/23                 DME Agency: NA       HH Arranged: PT HH Agency: [facility] Date [facility] Agency Contacted: 03/02/23 Time HH Agency Contacted: 0930 Representative spoke with at [facility] Agency: Kandee Keen  Prior Living Arrangements/Services Living arrangements for the past 2 months: Single Family Home Lives with:: Spouse Patient language and need for interpreter reviewed:: Yes        Need for Family Participation in Patient Care: Yes (Comment) Care giver support system in place?: Yes (comment) Current home services: DME (rolling walker, bedside commode, cane) Criminal Activity/Legal Involvement Pertinent to Current Situation/Hospitalization: No - Comment as needed  Activities of Daily Living    ADL Screening (condition at time of admission) Independently performs ADLs?: Yes (appropriate for developmental age) Is the patient deaf or have difficulty hearing?: Yes Does the patient have difficulty seeing, even when wearing glasses/contacts?: No Does the patient have difficulty concentrating, remembering, or making decisions?: No  Permission Sought/Granted Permission sought to share information with : Case Manager, Family Supports Permission granted to share information with : Yes, Verbal Permission Granted     Permission granted to share info w AGENCY: [facility]        Emotional Assessment Appearance:: Appears stated age Attitude/Demeanor/Rapport: Engaged Affect (typically observed): Appropriate Orientation: : Oriented to Self, Oriented to Place, Oriented to  Time, Oriented to Situation Alcohol / Substance Use: Not Applicable Psych Involvement: No (comment)  Admission diagnosis:  Chest pain [R07.9] Chest pain due to myocardial ischemia, unspecified ischemic chest pain type [I25.9] Patient Active Problem List   Diagnosis Date Noted   Unstable angina (HCC) 03/01/2023   Orthostatic hypotension 03/01/2023   Chest pain 02/27/2023   Elevated troponin 02/27/2023   Hyperlipidemia 02/27/2023   Myocardial infarction due to demand ischemia (HCC) 10/17/2022   Acute on chronic hypoxic respiratory failure (HCC) 10/17/2022   CKD (chronic kidney disease) stage 3, GFR 30-59 ml/min (HCC) 10/16/2022   ILD (interstitial lung disease) (HCC) 10/16/2022   Mild Aortic stenosis 10/16/2022   Acute on chronic systolic heart failure (HCC) 10/14/2022   Ischemic cardiomyopathy 10/10/2022   NSTEMI (non-ST elevated myocardial infarction) (HCC) 10/05/2022   Near syncope 02/06/2022   Infrarenal  abdominal aortic aneurysm (AAA) without rupture (HCC) 02/06/2022   Normocytic anemia 02/06/2022   Coronary artery disease due to lipid rich plaque 04/14/2021   Type 2 diabetes mellitus with complication, without  long-term current use of insulin (HCC) 04/10/2017   Essential hypertension 04/10/2017   PCP:  [name], MD Pharmacy:   [facility] [phone] [place], [place] - [address] [place] [address] Phone: [phone] Fax: [phone]  [facility] - [place], [place] - [address] [facility] Portal to Reg[facility] [address] [place] [address] Phone: [phone] Fax: [phone]     Social Determinants of Health (SDOH) Social History: SDOH Screenings   Food Insecurity: No Food Insecurity (02/28/2023)  Housing: Low Risk  (02/28/2023)  Transportation Needs: No Transportation Needs (02/28/2023)  Utilities: Not At Risk (02/28/2023)  Alcohol Screen: Low Risk  (10/07/2022)  Financial Resource Strain: Low Risk  (10/07/2022)  Tobacco Use: Medium Risk (02/27/2023)   SDOH Interventions:     Readmission Risk Interventions     No data to display

## 2023-03-04 ENCOUNTER — Ambulatory Visit: Payer: Self-pay

## 2023-03-04 NOTE — Patient Outreach (Signed)
Care Coordination   Follow Up Visit Note   03/04/2023 Name: [name] Steele MRN: [id number] DOB: [birth year]  [name] Steele is a 82 y.o. year old male who sees [name], MD for primary care. I  spoke with spouse by phone today.   What matters to the patients health and wellness today?  Maintaining health    Goals Addressed             This Visit's Progress    Heart Failure Management       Patient Goals/Self Care Activities: -Patient/Caregiver will self-administer medications as prescribed as evidenced by self-report/primary caregiver report  -Patient/Caregiver will attend all scheduled provider appointments as evidenced by clinician review of documented attendance to scheduled appointments and patient/caregiver report -Patient/Caregiver will call provider office for new concerns or questions as evidenced by review of documented incoming telephone call notes and patient report  -trim toenails straight across -wear comfortable, well-fitting shoes -Weigh daily and record (notify MD with 3 lb weight gain over night or 5 lb in a week) -Follow CHF Action Plan -Adhere to low sodium diet   I spoke with spouse.  She reports patient doing okay. Recent hospitalization for chest pain.  Patient being treated medically for valve stenosis.  She reports weight is up some.  Weight this AM 132.1 lbs.  Encouraged continued supplements and high calorie foods.    Discussed heart failure management and chest pain management.  No concerns.         SDOH assessments and interventions completed:  Yes  SDOH Interventions Today    Flowsheet Row Most Recent Value  SDOH Interventions   Housing Interventions Intervention Not Indicated  Utilities Interventions Intervention Not Indicated        Care Coordination Interventions:  Yes, provided   Follow up plan: Follow up call scheduled for 10/31    Encounter Outcome:  Patient Visit Completed   Bary Leriche, RN, MSN [place]   [facility], [facility] Management Community Coordinator Direct Dial: [phone]  Fax: [phone] Website: [email or web address]

## 2023-03-04 NOTE — Patient Instructions (Signed)
Visit Information  Thank you for taking time to visit with me today. Please don't hesitate to contact me if I can be of assistance to you.   Following are the goals we discussed today:   Goals Addressed             This Visit's Progress    Heart Failure Management       Patient Goals/Self Care Activities: -Patient/Caregiver will self-administer medications as prescribed as evidenced by self-report/primary caregiver report  -Patient/Caregiver will attend all scheduled provider appointments as evidenced by clinician review of documented attendance to scheduled appointments and patient/caregiver report -Patient/Caregiver will call provider office for new concerns or questions as evidenced by review of documented incoming telephone call notes and patient report  -trim toenails straight across -wear comfortable, well-fitting shoes -Weigh daily and record (notify MD with 3 lb weight gain over night or 5 lb in a week) -Follow CHF Action Plan -Adhere to low sodium diet   I spoke with spouse.  She reports patient doing okay. Recent hospitalization for chest pain.  Patient being treated medically for valve stenosis.  She reports weight is up some.  Weight this AM 132.1 lbs.  Encouraged continued supplements and high calorie foods.    Discussed heart failure management and chest pain management.  No concerns.         Our next appointment is by telephone on 03/18/23 at 1200 pm  Please call the care guide team at [phone] if you need to cancel or reschedule your appointment.   If you are experiencing a Mental Health or Behavioral Health Crisis or need someone to talk to, please call the Suicide and Crisis Lifeline: 988   Patient verbalizes understanding of instructions and care plan provided today and agrees to view in MyChart. Active MyChart status and patient understanding of how to access instructions and care plan via MyChart confirmed with patient.     The patient has been provided  with contact information for the care management team and has been advised to call with any health related questions or concerns.    Bary Leriche, RN, MSN [facility], [facility] Management Community Coordinator Direct Dial: [phone]  Fax: [phone] Website: [email or web address]

## 2023-03-08 ENCOUNTER — Ambulatory Visit: Payer: Medicare Other

## 2023-03-09 DIAGNOSIS — I2583 Coronary atherosclerosis due to lipid rich plaque: Secondary | ICD-10-CM | POA: Diagnosis not present

## 2023-03-09 DIAGNOSIS — Z7982 Long term (current) use of aspirin: Secondary | ICD-10-CM | POA: Diagnosis not present

## 2023-03-09 DIAGNOSIS — Z7902 Long term (current) use of antithrombotics/antiplatelets: Secondary | ICD-10-CM | POA: Diagnosis not present

## 2023-03-09 DIAGNOSIS — N1832 Chronic kidney disease, stage 3b: Secondary | ICD-10-CM | POA: Diagnosis not present

## 2023-03-09 DIAGNOSIS — I5043 Acute on chronic combined systolic (congestive) and diastolic (congestive) heart failure: Secondary | ICD-10-CM | POA: Diagnosis not present

## 2023-03-09 DIAGNOSIS — I252 Old myocardial infarction: Secondary | ICD-10-CM | POA: Diagnosis not present

## 2023-03-09 DIAGNOSIS — E785 Hyperlipidemia, unspecified: Secondary | ICD-10-CM | POA: Diagnosis not present

## 2023-03-09 DIAGNOSIS — E1122 Type 2 diabetes mellitus with diabetic chronic kidney disease: Secondary | ICD-10-CM | POA: Diagnosis not present

## 2023-03-09 DIAGNOSIS — I130 Hypertensive heart and chronic kidney disease with heart failure and stage 1 through stage 4 chronic kidney disease, or unspecified chronic kidney disease: Secondary | ICD-10-CM | POA: Diagnosis not present

## 2023-03-09 DIAGNOSIS — I25110 Atherosclerotic heart disease of native coronary artery with unstable angina pectoris: Secondary | ICD-10-CM | POA: Diagnosis not present

## 2023-03-09 DIAGNOSIS — M109 Gout, unspecified: Secondary | ICD-10-CM | POA: Diagnosis not present

## 2023-03-09 DIAGNOSIS — J439 Emphysema, unspecified: Secondary | ICD-10-CM | POA: Diagnosis not present

## 2023-03-09 DIAGNOSIS — Z7984 Long term (current) use of oral hypoglycemic drugs: Secondary | ICD-10-CM | POA: Diagnosis not present

## 2023-03-09 DIAGNOSIS — Z9181 History of falling: Secondary | ICD-10-CM | POA: Diagnosis not present

## 2023-03-09 DIAGNOSIS — I255 Ischemic cardiomyopathy: Secondary | ICD-10-CM | POA: Diagnosis not present

## 2023-03-09 DIAGNOSIS — J849 Interstitial pulmonary disease, unspecified: Secondary | ICD-10-CM | POA: Diagnosis not present

## 2023-03-09 DIAGNOSIS — I951 Orthostatic hypotension: Secondary | ICD-10-CM | POA: Diagnosis not present

## 2023-03-09 DIAGNOSIS — I700 Atherosclerosis of aorta: Secondary | ICD-10-CM | POA: Diagnosis not present

## 2023-03-09 DIAGNOSIS — I3481 Nonrheumatic mitral (valve) annulus calcification: Secondary | ICD-10-CM | POA: Diagnosis not present

## 2023-03-10 DIAGNOSIS — I5043 Acute on chronic combined systolic (congestive) and diastolic (congestive) heart failure: Secondary | ICD-10-CM | POA: Diagnosis not present

## 2023-03-10 DIAGNOSIS — N1832 Chronic kidney disease, stage 3b: Secondary | ICD-10-CM | POA: Diagnosis not present

## 2023-03-10 DIAGNOSIS — E1122 Type 2 diabetes mellitus with diabetic chronic kidney disease: Secondary | ICD-10-CM | POA: Diagnosis not present

## 2023-03-10 DIAGNOSIS — I130 Hypertensive heart and chronic kidney disease with heart failure and stage 1 through stage 4 chronic kidney disease, or unspecified chronic kidney disease: Secondary | ICD-10-CM | POA: Diagnosis not present

## 2023-03-10 DIAGNOSIS — I3481 Nonrheumatic mitral (valve) annulus calcification: Secondary | ICD-10-CM | POA: Diagnosis not present

## 2023-03-10 DIAGNOSIS — I255 Ischemic cardiomyopathy: Secondary | ICD-10-CM | POA: Diagnosis not present

## 2023-03-11 ENCOUNTER — Ambulatory Visit: Payer: Medicare Other | Attending: Physician Assistant

## 2023-03-11 DIAGNOSIS — I5023 Acute on chronic systolic (congestive) heart failure: Secondary | ICD-10-CM

## 2023-03-11 LAB — BASIC METABOLIC PANEL
BUN/Creatinine Ratio: 19 (ref 10–24)
BUN: 23 mg/dL (ref 8–27)
CO2: 24 mmol/L (ref 20–29)
Calcium: 9.4 mg/dL (ref 8.6–10.2)
Chloride: 102 mmol/L (ref 96–106)
Creatinine, Ser: 1.23 mg/dL (ref 0.76–1.27)
Glucose: 91 mg/dL (ref 70–99)
Potassium: 4.3 mmol/L (ref 3.5–5.2)
Sodium: 137 mmol/L (ref 134–144)
eGFR: 59 mL/min/{1.73_m2} — ABNORMAL LOW (ref >59)

## 2023-03-16 ENCOUNTER — Encounter: Payer: Self-pay | Admitting: Physician Assistant

## 2023-03-16 ENCOUNTER — Ambulatory Visit: Payer: Medicare Other | Attending: Physician Assistant | Admitting: Physician Assistant

## 2023-03-16 VITALS — BP 114/72 | HR 86 | Ht 70.0 in | Wt 140.2 lb

## 2023-03-16 DIAGNOSIS — I25118 Atherosclerotic heart disease of native coronary artery with other forms of angina pectoris: Secondary | ICD-10-CM | POA: Insufficient documentation

## 2023-03-16 DIAGNOSIS — I3481 Nonrheumatic mitral (valve) annulus calcification: Secondary | ICD-10-CM | POA: Diagnosis not present

## 2023-03-16 DIAGNOSIS — I255 Ischemic cardiomyopathy: Secondary | ICD-10-CM | POA: Insufficient documentation

## 2023-03-16 DIAGNOSIS — E785 Hyperlipidemia, unspecified: Secondary | ICD-10-CM | POA: Insufficient documentation

## 2023-03-16 DIAGNOSIS — I5020 Unspecified systolic (congestive) heart failure: Secondary | ICD-10-CM | POA: Diagnosis not present

## 2023-03-16 DIAGNOSIS — I071 Rheumatic tricuspid insufficiency: Secondary | ICD-10-CM | POA: Diagnosis not present

## 2023-03-16 DIAGNOSIS — I214 Non-ST elevation (NSTEMI) myocardial infarction: Secondary | ICD-10-CM | POA: Insufficient documentation

## 2023-03-16 DIAGNOSIS — I351 Nonrheumatic aortic (valve) insufficiency: Secondary | ICD-10-CM | POA: Insufficient documentation

## 2023-03-16 DIAGNOSIS — I5043 Acute on chronic combined systolic (congestive) and diastolic (congestive) heart failure: Secondary | ICD-10-CM | POA: Diagnosis not present

## 2023-03-16 DIAGNOSIS — N1832 Chronic kidney disease, stage 3b: Secondary | ICD-10-CM | POA: Diagnosis not present

## 2023-03-16 DIAGNOSIS — I130 Hypertensive heart and chronic kidney disease with heart failure and stage 1 through stage 4 chronic kidney disease, or unspecified chronic kidney disease: Secondary | ICD-10-CM | POA: Diagnosis not present

## 2023-03-16 DIAGNOSIS — E1122 Type 2 diabetes mellitus with diabetic chronic kidney disease: Secondary | ICD-10-CM | POA: Diagnosis not present

## 2023-03-16 DIAGNOSIS — I350 Nonrheumatic aortic (valve) stenosis: Secondary | ICD-10-CM | POA: Diagnosis not present

## 2023-03-16 NOTE — Patient Instructions (Signed)
Medication Instructions:  Your physician recommends that you continue on your current medications as directed. Please refer to the Current Medication list given to you today.  *If you need a refill on your cardiac medications before your next appointment, please call your pharmacy*  Lab Work: None ordered If you have labs (blood work) drawn today and your tests are completely normal, you will receive your results only by: MyChart Message (if you have MyChart) OR A paper copy in the mail If you have any lab test that is abnormal or we need to change your treatment, we will call you to review the results.  Follow-Up: At [facility], you and your health needs are our priority.  As part of our continuing mission to provide you with exceptional heart care, we have created designated Provider Care Teams.  These Care Teams include your primary Cardiologist (physician) and Advanced Practice Providers (APPs -  Physician Assistants and Nurse Practitioners) who all work together to provide you with the care you need, when you need it.  Your next appointment:   6 month(s)  Provider:   Donato Schultz, MD     Low-Sodium Eating Plan Salt (sodium) helps you keep a healthy balance of fluids in your body. Too much sodium can raise your blood pressure. It can also cause fluid and waste to be held in your body. Your health care provider or dietitian may recommend a low-sodium eating plan if you have high blood pressure (hypertension), kidney disease, liver disease, or heart failure. Eating less sodium can help lower your blood pressure and reduce swelling. It can also protect your heart, liver, and kidneys. What are tips for following this plan? Reading food labels  Check food labels for the amount of sodium per serving. If you eat more than one serving, you must multiply the listed amount by the number of servings. Choose foods with less than 140 milligrams (mg) of sodium per serving. Avoid foods  with 300 mg of sodium or more per serving. Always check how much sodium is in a product, even if the label says "unsalted" or "no salt added." Shopping  Buy products labeled as "low-sodium" or "no salt added." Buy fresh foods. Avoid canned foods and pre-made or frozen meals. Avoid canned, cured, or processed meats. Buy breads that have less than 80 mg of sodium per slice. Cooking  Eat more home-cooked food. Try to eat less restaurant, buffet, and fast food. Try not to add salt when you cook. Use salt-free seasonings or herbs instead of table salt or sea salt. Check with your provider or pharmacist before using salt substitutes. Cook with plant-based oils, such as canola, sunflower, or olive oil. Meal planning When eating at a restaurant, ask if your food can be made with less salt or no salt. Avoid dishes labeled as brined, pickled, cured, or smoked. Avoid dishes made with soy sauce, miso, or teriyaki sauce. Avoid foods that have monosodium glutamate (MSG) in them. MSG may be added to some restaurant food, sauces, soups, bouillon, and canned foods. Make meals that can be grilled, baked, poached, roasted, or steamed. These are often made with less sodium. General information Try to limit your sodium intake to 1,500-2,300 mg each day, or the amount told by your provider. What foods should I eat? Fruits Fresh, frozen, or canned fruit. Fruit juice. Vegetables Fresh or frozen vegetables. "No salt added" canned vegetables. "No salt added" tomato sauce and paste. Low-sodium or reduced-sodium tomato and vegetable juice. Grains Low-sodium cereals,  such as oats, puffed wheat and rice, and shredded wheat. Low-sodium crackers. Unsalted rice. Unsalted pasta. Low-sodium bread. Whole grain breads and whole grain pasta. Meats and other proteins Fresh or frozen meat, poultry, seafood, and fish. These should have no added salt. Low-sodium canned tuna and salmon. Unsalted nuts. Dried peas, beans, and  lentils without added salt. Unsalted canned beans. Eggs. Unsalted nut butters. Dairy Milk. Soy milk. Cheese that is naturally low in sodium, such as ricotta cheese, fresh mozzarella, or Swiss cheese. Low-sodium or reduced-sodium cheese. Cream cheese. Yogurt. Seasonings and condiments Fresh and dried herbs and spices. Salt-free seasonings. Low-sodium mustard and ketchup. Sodium-free salad dressing. Sodium-free light mayonnaise. Fresh or refrigerated horseradish. Lemon juice. Vinegar. Other foods Homemade, reduced-sodium, or low-sodium soups. Unsalted popcorn and pretzels. Low-salt or salt-free chips. The items listed above may not be all the foods and drinks you can have. Talk to a dietitian to learn more. What foods should I avoid? Vegetables Sauerkraut, pickled vegetables, and relishes. Olives. [place] fries. Onion rings. Regular canned vegetables, except low-sodium or reduced-sodium items. Regular canned tomato sauce and paste. Regular tomato and vegetable juice. Frozen vegetables in sauces. Grains Instant hot cereals. Bread stuffing, pancake, and biscuit mixes. Croutons. Seasoned rice or pasta mixes. Noodle soup cups. Boxed or frozen macaroni and cheese. Regular salted crackers. Self-rising flour. Meats and other proteins Meat or fish that is salted, canned, smoked, spiced, or pickled. Precooked or cured meat, such as sausages or meat loaves. Tomasa Blase. Ham. Pepperoni. Hot dogs. Corned beef. Chipped beef. Salt pork. Jerky. Pickled herring, anchovies, and sardines. Regular canned tuna. Salted nuts. Dairy Processed cheese and cheese spreads. Hard cheeses. Cheese curds. Blue cheese. Feta cheese. String cheese. Regular cottage cheese. Buttermilk. Canned milk. Fats and oils Salted butter. Regular margarine. Ghee. Bacon fat. Seasonings and condiments Onion salt, garlic salt, seasoned salt, table salt, and sea salt. Canned and packaged gravies. Worcestershire sauce. Tartar sauce. Barbecue sauce. Teriyaki  sauce. Soy sauce, including reduced-sodium soy sauce. Steak sauce. Fish sauce. Oyster sauce. Cocktail sauce. Horseradish that you find on the shelf. Regular ketchup and mustard. Meat flavorings and tenderizers. Bouillon cubes. Hot sauce. Pre-made or packaged marinades. Pre-made or packaged taco seasonings. Relishes. Regular salad dressings. Salsa. Other foods Salted popcorn and pretzels. Corn chips and puffs. Potato and tortilla chips. Canned or dried soups. Pizza. Frozen entrees and pot pies. The items listed above may not be all the foods and drinks you should avoid. Talk to a dietitian to learn more. This information is not intended to replace advice given to you by your health care provider. Make sure you discuss any questions you have with your health care provider. Document Revised: 05/21/2022 Document Reviewed: 05/21/2022 Elsevier Patient Education  2024 Elsevier Inc. Heart-Healthy Eating Plan Many factors influence your heart health, including eating and exercise habits. Heart health is also called coronary health. Coronary risk increases with abnormal blood fat (lipid) levels. A heart-healthy eating plan includes limiting unhealthy fats, increasing healthy fats, limiting salt (sodium) intake, and making other diet and lifestyle changes. What is my plan? Your health care provider may recommend that: You limit your fat intake to _________% or less of your total calories each day. You limit your saturated fat intake to _________% or less of your total calories each day. You limit the amount of cholesterol in your diet to less than _________ mg per day. You limit the amount of sodium in your diet to less than _________ mg per day. What are tips for following this  plan? Cooking Cook foods using methods other than frying. Baking, boiling, grilling, and broiling are all good options. Other ways to reduce fat include: Removing the skin from poultry. Removing all visible fats from  meats. Steaming vegetables in water or broth. Meal planning  At meals, imagine dividing your plate into fourths: Fill one-half of your plate with vegetables and green salads. Fill one-fourth of your plate with whole grains. Fill one-fourth of your plate with lean protein foods. Eat 2-4 cups of vegetables per day. One cup of vegetables equals 1 cup (91 g) broccoli or cauliflower florets, 2 medium carrots, 1 large bell pepper, 1 large sweet potato, 1 large tomato, 1 medium white potato, 2 cups (150 g) raw leafy greens. Eat 1-2 cups of fruit per day. One cup of fruit equals 1 small apple, 1 large banana, 1 cup (237 g) mixed fruit, 1 large orange,  cup (82 g) dried fruit, 1 cup (240 mL) 100% fruit juice. Eat more foods that contain soluble fiber. Examples include apples, broccoli, carrots, beans, peas, and barley. Aim to get 25-30 g of fiber per day. Increase your consumption of legumes, nuts, and seeds to 4-5 servings per week. One serving of dried beans or legumes equals  cup (90 g) cooked, 1 serving of nuts is  oz (12 almonds, 24 pistachios, or 7 walnut halves), and 1 serving of seeds equals  oz (8 g). Fats Choose healthy fats more often. Choose monounsaturated and polyunsaturated fats, such as olive and canola oils, avocado oil, flaxseeds, walnuts, almonds, and seeds. Eat more omega-3 fats. Choose salmon, mackerel, sardines, tuna, flaxseed oil, and ground flaxseeds. Aim to eat fish at least 2 times each week. Check food labels carefully to identify foods with trans fats or high amounts of saturated fat. Limit saturated fats. These are found in animal products, such as meats, butter, and cream. Plant sources of saturated fats include palm oil, palm kernel oil, and coconut oil. Avoid foods with partially hydrogenated oils in them. These contain trans fats. Examples are stick margarine, some tub margarines, cookies, crackers, and other baked goods. Avoid fried foods. General information Eat  more home-cooked food and less restaurant, buffet, and fast food. Limit or avoid alcohol. Limit foods that are high in added sugar and simple starches such as foods made using white refined flour (white breads, pastries, sweets). Lose weight if you are overweight. Losing just 5-10% of your body weight can help your overall health and prevent diseases such as diabetes and heart disease. Monitor your sodium intake, especially if you have high blood pressure. Talk with your health care provider about your sodium intake. Try to incorporate more vegetarian meals weekly. What foods should I eat? Fruits All fresh, canned (in natural juice), or frozen fruits. Vegetables Fresh or frozen vegetables (raw, steamed, roasted, or grilled). Green salads. Grains Most grains. Choose whole wheat and whole grains most of the time. Rice and pasta, including brown rice and pastas made with whole wheat. Meats and other proteins Lean, well-trimmed beef, veal, pork, and lamb. Chicken and [place] without skin. All fish and shellfish. Wild duck, rabbit, pheasant, and venison. Egg whites or low-cholesterol egg substitutes. Dried beans, peas, lentils, and tofu. Seeds and most nuts. Dairy Low-fat or nonfat cheeses, including ricotta and mozzarella. Skim or 1% milk (liquid, powdered, or evaporated). Buttermilk made with low-fat milk. Nonfat or low-fat yogurt. Fats and oils Non-hydrogenated (trans-free) margarines. Vegetable oils, including soybean, sesame, sunflower, olive, avocado, peanut, safflower, corn, canola, and cottonseed. Salad dressings  or mayonnaise made with a vegetable oil. Beverages Water (mineral or sparkling). Coffee and tea. Unsweetened ice tea. Diet beverages. Sweets and desserts Sherbet, gelatin, and fruit ice. Small amounts of dark chocolate. Limit all sweets and desserts. Seasonings and condiments All seasonings and condiments. The items listed above may not be a complete list of foods and beverages  you can eat. Contact a dietitian for more options. What foods should I avoid? Fruits Canned fruit in heavy syrup. Fruit in cream or butter sauce. Fried fruit. Limit coconut. Vegetables Vegetables cooked in cheese, cream, or butter sauce. Fried vegetables. Grains Breads made with saturated or trans fats, oils, or whole milk. Croissants. Sweet rolls. Donuts. High-fat crackers, such as cheese crackers and chips. Meats and other proteins Fatty meats, such as hot dogs, ribs, sausage, bacon, rib-eye roast or steak. High-fat deli meats, such as salami and bologna. Caviar. Domestic duck and goose. Organ meats, such as liver. Dairy Cream, sour cream, cream cheese, and creamed cottage cheese. Whole-milk cheeses. Whole or 2% milk (liquid, evaporated, or condensed). Whole buttermilk. Cream sauce or high-fat cheese sauce. Whole-milk yogurt. Fats and oils Meat fat, or shortening. Cocoa butter, hydrogenated oils, palm oil, coconut oil, palm kernel oil. Solid fats and shortenings, including bacon fat, salt pork, lard, and butter. Nondairy cream substitutes. Salad dressings with cheese or sour cream. Beverages Regular sodas and any drinks with added sugar. Sweets and desserts Frosting. Pudding. Cookies. Cakes. Pies. Milk chocolate or white chocolate. Buttered syrups. Full-fat ice cream or ice cream drinks. The items listed above may not be a complete list of foods and beverages to avoid. Contact a dietitian for more information. Summary Heart-healthy meal planning includes limiting unhealthy fats, increasing healthy fats, limiting salt (sodium) intake and making other diet and lifestyle changes. Lose weight if you are overweight. Losing just 5-10% of your body weight can help your overall health and prevent diseases such as diabetes and heart disease. Focus on eating a balance of foods, including fruits and vegetables, low-fat or nonfat dairy, lean protein, nuts and legumes, whole grains, and heart-healthy oils  and fats. This information is not intended to replace advice given to you by your health care provider. Make sure you discuss any questions you have with your health care provider. Document Revised: 06/09/2021 Document Reviewed: 06/09/2021 Elsevier Patient Education  2024 [employer].

## 2023-03-16 NOTE — Progress Notes (Signed)
Cardiology Office Note:  .   Date:  03/16/2023  ID:  [name] Steele, DOB [birth year], MRN [id number] PCP: [name], MD  [facility] Providers Cardiologist:  Donato Schultz, MD {  History of Present Illness: .   [name] Steele is a 82 y.o. male with a past medical history of elevated calcium score 6230 back in 2022 which was 97 percentile also evidence of chronic ILD and was referred to pulmonology here for follow-up appointment.  Ultimately declined referral to pulmonology for chronic ILD.  Nuclear stress test was positive for small territory of ischemia but felt to be low risk so Dr. Anne Fu manages medically.  Statin was added.  Admitted 10/05/2022 with NSTEMI.  LHC demonstrated critical left main stenosis and severe proximal LAD and OM stenosis, EF 30%, required IABP.  Formal echo 10/05/2022 showed LVEF 2025%.  Considered but patient was not recovered from surgery and opted for medical management only.  CODE STATUS changed to DNR.  He was seen by palliative care and declined outpatient palliative services.  Patient presented for follow-up after recent hospitalization 02/03/2023 with Dr. Anne Fu.  Reported intermittent chest discomfort which she described as a "little Scarlette Calico" which occur sporadically and often during activities such as cooking.  The discomfort is not severe and resolves with rest.  He also reports increased urinary frequency which she attributes to his current diuretic regimen of Lasix and spironolactone.  He expressed concern about potential nutrient loss due to frequent urination and fluctuation in weight.  Additionally, reports experiencing a burning pain during urination which he suspects may be due to a chronic infection.  He is scheduled to see urology for further evaluation  Today, he presents for a follow-up visit after a recent hospitalization. He reports episodes of chest pain and lightheadedness, which have been managed with nitroglycerin. The patient's  spouse reports that the patient becomes short of breath during activities such as cooking and grocery shopping, but denies any associated chest pain. The patient's heart pump function is noted to be low, and he has multiple blockages in the arteries of his heart, which have been managed medically due to the patient's unsuitability for open surgery. The patient also reports a cough, which is suggested to be due to upper respiratory congestion.  We discussed possible ICD candidacy and I did told the patient I would discuss this with Dr. Anne Fu  Reports no chest pain, pressure, or tightness. No edema, orthopnea, PND. Reports no palpitations.    ROS: Pertinent ROS in HPI  Studies Reviewed: .       Echo 10/05/2022  IMPRESSIONS     1. Presence of apical swirling/sludge with definity contrast without  evidence of formed thrombus. Left ventricular ejection fraction, by  estimation, is 20 to 25%. The left ventricle has severely decreased  function. The left ventricle demonstrates global   hypokinesis. Left ventricular diastolic parameters are consistent with  Grade II diastolic dysfunction (pseudonormalization).   2. Right ventricular systolic function is normal. The right ventricular  size is normal. There is moderately elevated pulmonary artery systolic  pressure. The estimated right ventricular systolic pressure is 59.1 mmHg.   3. The mitral valve is grossly normal. Trivial mitral valve  regurgitation.   4. Tricuspid valve regurgitation is mild to moderate.   5. The aortic valve is calcified. There is moderate calcification of the  aortic valve. There is moderate thickening of the aortic valve. Aortic  valve regurgitation is mild. Mild aortic valve stenosis. Aortic valve  area,  by VTI measures 1.11 cm. Aortic  valve mean gradient measures 10.0 mmHg. Aortic valve Vmax measures 2.06  m/s.   6. The inferior vena cava is dilated in size with <50% respiratory  variability, suggesting right  atrial pressure of 15 mmHg.   Comparison(s): Changes from prior study are noted.   Conclusion(s)/Recommendation(s): Severe LV dysfunction, with severe  hypokinesis to akinesis worst at the apex, present throughout LAD/LCx  territory. Basal to mid inferior/inferolateral walls with preserved  function.   FINDINGS   Left Ventricle: Presence of apical swirling/sludge with definity contrast  without evidence of formed thrombus. Left ventricular ejection fraction,  by estimation, is 20 to 25%. The left ventricle has severely decreased  function. The left ventricle  demonstrates global hypokinesis. Definity contrast agent was given IV to  delineate the left ventricular endocardial borders. The left ventricular  internal cavity size was normal in size. There is no left ventricular  hypertrophy. Left ventricular  diastolic parameters are consistent with Grade II diastolic dysfunction  (pseudonormalization).     LV Wall Scoring:  The apical lateral segment, apical septal segment, apical anterior  segment,  and apex are akinetic. The anterior wall, antero-lateral wall, anterior  septum, mid inferoseptal segment, apical inferior segment, and basal  inferoseptal segment are hypokinetic. The inferior wall and posterior wall  are normal.   Right Ventricle: The right ventricular size is normal. No increase in  right ventricular wall thickness. Right ventricular systolic function is  normal. There is moderately elevated pulmonary artery systolic pressure.  The tricuspid regurgitant velocity is  3.32 m/s, and with an assumed right atrial pressure of 15 mmHg, the  estimated right ventricular systolic pressure is 59.1 mmHg.   Left Atrium: Left atrial size was normal in size.   Right Atrium: Right atrial size was normal in size.   Pericardium: Trivial pericardial effusion is present.   Mitral Valve: The mitral valve is grossly normal. Trivial mitral valve  regurgitation.   Tricuspid Valve:  The tricuspid valve is grossly normal. Tricuspid valve  regurgitation is mild to moderate.   Aortic Valve: The aortic valve is calcified. There is moderate  calcification of the aortic valve. There is moderate thickening of the  aortic valve. Aortic valve regurgitation is mild. Mild aortic stenosis is  present. Aortic valve mean gradient measures  10.0 mmHg. Aortic valve peak gradient measures 17.0 mmHg. Aortic valve  area, by VTI measures 1.11 cm.   Pulmonic Valve: The pulmonic valve was not well visualized. Pulmonic valve  regurgitation is trivial.   Aorta: The ascending aorta was not well visualized and the aortic root is  normal in size and structure.   Venous: The inferior vena cava is dilated in size with less than 50%  respiratory variability, suggesting right atrial pressure of 15 mmHg.   IAS/Shunts: The atrial septum is grossly normal.   Additional Comments: There is pleural effusion in the left lateral region.        Physical Exam:   VS:  BP 114/72   Pulse 86   Ht 5\' 10"  (1.778 m)   Wt 140 lb 3.2 oz (63.6 kg)   BMI 20.12 kg/m    Wt Readings from Last 3 Encounters:  03/16/23 140 lb 3.2 oz (63.6 kg)  03/02/23 135 lb 2.3 oz (61.3 kg)  02/03/23 136 lb 3.2 oz (61.8 kg)    GEN: Well nourished, well developed in no acute distress NECK: No JVD; No carotid bruits CARDIAC: RRR, no murmurs, rubs, gallops RESPIRATORY:  Clear to auscultation without rales, wheezing or rhonchi  ABDOMEN: Soft, non-tender, non-distended EXTREMITIES:  No edema; No deformity   ASSESSMENT AND PLAN: .     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 Not a candidate for stent or bypass surgery due to comorbidities. No recent chest pain reported. Currently managed with medication including Ranexa. -Continue current medication regimen. -Consider consultation with electrophysiology for potential ICD  placement due to low heart pump function.  Lightheadedness Reported feeling lightheaded after chest pain episode. No recent episodes reported. -Monitor symptoms.  Cough Reported by spouse. Lungs clear on examination. -Recommend over-the-counter Mucinex (avoid decongestant version) for symptom management.  Hyperlipidemia -LDL 31 -Continue current medication regimen -Continue low-sodium, heart healthy diet  Follow-up -Communicate with Dr. Pilar Jarvis regarding potential ICD placement. -Continue monitoring symptoms and report any chest pain or lightheadedness.      Dispo: He can follow-up in 6 months with Dr. Anne Fu  Signed, Sharlene Dory, PA-C

## 2023-03-17 DIAGNOSIS — E1122 Type 2 diabetes mellitus with diabetic chronic kidney disease: Secondary | ICD-10-CM | POA: Diagnosis not present

## 2023-03-17 DIAGNOSIS — N1832 Chronic kidney disease, stage 3b: Secondary | ICD-10-CM | POA: Diagnosis not present

## 2023-03-17 DIAGNOSIS — I5043 Acute on chronic combined systolic (congestive) and diastolic (congestive) heart failure: Secondary | ICD-10-CM | POA: Diagnosis not present

## 2023-03-17 DIAGNOSIS — I130 Hypertensive heart and chronic kidney disease with heart failure and stage 1 through stage 4 chronic kidney disease, or unspecified chronic kidney disease: Secondary | ICD-10-CM | POA: Diagnosis not present

## 2023-03-17 DIAGNOSIS — I255 Ischemic cardiomyopathy: Secondary | ICD-10-CM | POA: Diagnosis not present

## 2023-03-17 DIAGNOSIS — I3481 Nonrheumatic mitral (valve) annulus calcification: Secondary | ICD-10-CM | POA: Diagnosis not present

## 2023-03-18 ENCOUNTER — Ambulatory Visit: Payer: Self-pay

## 2023-03-18 NOTE — Patient Outreach (Signed)
Care Coordination   Follow Up Visit Note   03/18/2023 Name: Coral Krammes MRN: [id number] DOB: October 23, 1940  Key Brule is a 82 y.o. year old male who sees [name], MD for primary care. I spoke with  [name] by phone today.  What matters to the patients health and wellness today?  Maintain health    Goals Addressed             This Visit's Progress    Heart Failure Management       Patient Goals/Self Care Activities: -Patient/Caregiver will self-administer medications as prescribed as evidenced by self-report/primary caregiver report  -Patient/Caregiver will attend all scheduled provider appointments as evidenced by clinician review of documented attendance to scheduled appointments and patient/caregiver report -Patient/Caregiver will call provider office for new concerns or questions as evidenced by review of documented incoming telephone call notes and patient report  -trim toenails straight across -wear comfortable, well-fitting shoes -Weigh daily and record (notify MD with 3 lb weight gain over night or 5 lb in a week) -Follow CHF Action Plan -Adhere to low sodium diet   Spoke with spouse.  She reports patient doing good. Weight good.  Patient maybe getting internal defibrillator, consulting with cardiologist. Reiterated heart failure and low salt diet, and supplements.  She verbalized understanding.  No concerns.         SDOH assessments and interventions completed:  Yes     Care Coordination Interventions:  Yes, provided   Follow up plan: Follow up call scheduled for November    Encounter Outcome:  Patient Visit Completed   Bary Leriche, RN, MSN [facility], [facility] Management Community Coordinator Direct Dial: [phone]  Fax: [phone] Website: [email or web address]

## 2023-03-18 NOTE — Patient Instructions (Signed)
Visit Information  Thank you for taking time to visit with me today. Please don't hesitate to contact me if I can be of assistance to you.   Following are the goals we discussed today:   Goals Addressed             This Visit's Progress    Heart Failure Management       Patient Goals/Self Care Activities: -Patient/Caregiver will self-administer medications as prescribed as evidenced by self-report/primary caregiver report  -Patient/Caregiver will attend all scheduled provider appointments as evidenced by clinician review of documented attendance to scheduled appointments and patient/caregiver report -Patient/Caregiver will call provider office for new concerns or questions as evidenced by review of documented incoming telephone call notes and patient report  -trim toenails straight across -wear comfortable, well-fitting shoes -Weigh daily and record (notify MD with 3 lb weight gain over night or 5 lb in a week) -Follow CHF Action Plan -Adhere to low sodium diet   Spoke with spouse.  She reports patient doing good. Weight good.  Patient maybe getting internal defibrillator, consulting with cardiologist. Reiterated heart failure and low salt diet, and supplements.  She verbalized understanding.  No concerns.         Our next appointment is by telephone on 04/13/23 at 1200 pm  Please call the care guide team at [phone] if you need to cancel or reschedule your appointment.   If you are experiencing a Mental Health or Behavioral Health Crisis or need someone to talk to, please call the Suicide and Crisis Lifeline: 988   Patient verbalizes understanding of instructions and care plan provided today and agrees to view in MyChart. Active MyChart status and patient understanding of how to access instructions and care plan via MyChart confirmed with patient.     The patient has been provided with contact information for the care management team and has been advised to call with any  health related questions or concerns.   Bary Leriche, RN, MSN [facility], [facility] Management Community Coordinator Direct Dial: [phone]  Fax: [phone] Website: [email or web address]

## 2023-03-21 ENCOUNTER — Other Ambulatory Visit: Payer: Self-pay

## 2023-03-21 ENCOUNTER — Emergency Department (HOSPITAL_COMMUNITY): Payer: Medicare Other

## 2023-03-21 DIAGNOSIS — Z79899 Other long term (current) drug therapy: Secondary | ICD-10-CM | POA: Diagnosis not present

## 2023-03-21 DIAGNOSIS — Z66 Do not resuscitate: Secondary | ICD-10-CM | POA: Diagnosis present

## 2023-03-21 DIAGNOSIS — M109 Gout, unspecified: Secondary | ICD-10-CM | POA: Diagnosis present

## 2023-03-21 DIAGNOSIS — Z515 Encounter for palliative care: Secondary | ICD-10-CM

## 2023-03-21 DIAGNOSIS — Z881 Allergy status to other antibiotic agents status: Secondary | ICD-10-CM | POA: Diagnosis not present

## 2023-03-21 DIAGNOSIS — N1831 Chronic kidney disease, stage 3a: Secondary | ICD-10-CM | POA: Diagnosis not present

## 2023-03-21 DIAGNOSIS — R0682 Tachypnea, not elsewhere classified: Secondary | ICD-10-CM | POA: Diagnosis present

## 2023-03-21 DIAGNOSIS — E872 Acidosis, unspecified: Secondary | ICD-10-CM | POA: Diagnosis present

## 2023-03-21 DIAGNOSIS — Z7982 Long term (current) use of aspirin: Secondary | ICD-10-CM

## 2023-03-21 DIAGNOSIS — Z87891 Personal history of nicotine dependence: Secondary | ICD-10-CM | POA: Diagnosis not present

## 2023-03-21 DIAGNOSIS — H9190 Unspecified hearing loss, unspecified ear: Secondary | ICD-10-CM | POA: Diagnosis present

## 2023-03-21 DIAGNOSIS — Z7902 Long term (current) use of antithrombotics/antiplatelets: Secondary | ICD-10-CM

## 2023-03-21 DIAGNOSIS — I2101 ST elevation (STEMI) myocardial infarction involving left main coronary artery: Secondary | ICD-10-CM | POA: Diagnosis not present

## 2023-03-21 DIAGNOSIS — J849 Interstitial pulmonary disease, unspecified: Secondary | ICD-10-CM | POA: Diagnosis not present

## 2023-03-21 DIAGNOSIS — E1122 Type 2 diabetes mellitus with diabetic chronic kidney disease: Secondary | ICD-10-CM | POA: Diagnosis present

## 2023-03-21 DIAGNOSIS — R64 Cachexia: Secondary | ICD-10-CM | POA: Diagnosis not present

## 2023-03-21 DIAGNOSIS — Z8249 Family history of ischemic heart disease and other diseases of the circulatory system: Secondary | ICD-10-CM

## 2023-03-21 DIAGNOSIS — R110 Nausea: Secondary | ICD-10-CM | POA: Diagnosis not present

## 2023-03-21 DIAGNOSIS — I249 Acute ischemic heart disease, unspecified: Secondary | ICD-10-CM | POA: Diagnosis not present

## 2023-03-21 DIAGNOSIS — I255 Ischemic cardiomyopathy: Secondary | ICD-10-CM | POA: Diagnosis not present

## 2023-03-21 DIAGNOSIS — Z880 Allergy status to penicillin: Secondary | ICD-10-CM

## 2023-03-21 DIAGNOSIS — I5022 Chronic systolic (congestive) heart failure: Secondary | ICD-10-CM | POA: Diagnosis present

## 2023-03-21 DIAGNOSIS — I213 ST elevation (STEMI) myocardial infarction of unspecified site: Secondary | ICD-10-CM

## 2023-03-21 DIAGNOSIS — R000 Tachycardia, unspecified: Secondary | ICD-10-CM | POA: Diagnosis not present

## 2023-03-21 DIAGNOSIS — I2102 ST elevation (STEMI) myocardial infarction involving left anterior descending coronary artery: Principal | ICD-10-CM | POA: Diagnosis present

## 2023-03-21 DIAGNOSIS — R0789 Other chest pain: Secondary | ICD-10-CM | POA: Diagnosis not present

## 2023-03-21 DIAGNOSIS — I491 Atrial premature depolarization: Secondary | ICD-10-CM | POA: Diagnosis not present

## 2023-03-21 DIAGNOSIS — R0600 Dyspnea, unspecified: Secondary | ICD-10-CM | POA: Diagnosis not present

## 2023-03-21 DIAGNOSIS — R0902 Hypoxemia: Secondary | ICD-10-CM | POA: Diagnosis not present

## 2023-03-21 DIAGNOSIS — I2510 Atherosclerotic heart disease of native coronary artery without angina pectoris: Secondary | ICD-10-CM | POA: Diagnosis present

## 2023-03-21 DIAGNOSIS — I130 Hypertensive heart and chronic kidney disease with heart failure and stage 1 through stage 4 chronic kidney disease, or unspecified chronic kidney disease: Secondary | ICD-10-CM | POA: Diagnosis present

## 2023-03-21 DIAGNOSIS — I252 Old myocardial infarction: Secondary | ICD-10-CM | POA: Diagnosis not present

## 2023-03-21 DIAGNOSIS — R570 Cardiogenic shock: Principal | ICD-10-CM | POA: Diagnosis present

## 2023-03-21 DIAGNOSIS — I472 Ventricular tachycardia, unspecified: Secondary | ICD-10-CM | POA: Diagnosis present

## 2023-03-21 DIAGNOSIS — I77810 Thoracic aortic ectasia: Secondary | ICD-10-CM | POA: Diagnosis not present

## 2023-03-21 DIAGNOSIS — R52 Pain, unspecified: Secondary | ICD-10-CM | POA: Diagnosis not present

## 2023-03-21 DIAGNOSIS — M810 Age-related osteoporosis without current pathological fracture: Secondary | ICD-10-CM | POA: Diagnosis not present

## 2023-03-21 DIAGNOSIS — Z7984 Long term (current) use of oral hypoglycemic drugs: Secondary | ICD-10-CM

## 2023-03-21 DIAGNOSIS — Z7189 Other specified counseling: Secondary | ICD-10-CM | POA: Diagnosis not present

## 2023-03-21 DIAGNOSIS — Z789 Other specified health status: Secondary | ICD-10-CM | POA: Diagnosis not present

## 2023-03-21 LAB — LIPID PANEL
Cholesterol: 76 mg/dL (ref 0–200)
HDL: 37 mg/dL — ABNORMAL LOW (ref >40)
LDL Cholesterol: 23 mg/dL (ref 0–99)
Total CHOL/HDL Ratio: 2.1 {ratio}
Triglycerides: 82 mg/dL (ref <150)
VLDL: 16 mg/dL (ref 0–40)

## 2023-03-21 LAB — COMPREHENSIVE METABOLIC PANEL
ALT: 13 U/L (ref 0–44)
AST: 44 U/L — ABNORMAL HIGH (ref 15–41)
Albumin: 2.8 g/dL — ABNORMAL LOW (ref 3.5–5.0)
Alkaline Phosphatase: 31 U/L — ABNORMAL LOW (ref 38–126)
Anion gap: 12 (ref 5–15)
BUN: 29 mg/dL — ABNORMAL HIGH (ref 8–23)
CO2: 20 mmol/L — ABNORMAL LOW (ref 22–32)
Calcium: 8 mg/dL — ABNORMAL LOW (ref 8.9–10.3)
Chloride: 107 mmol/L (ref 98–111)
Creatinine, Ser: 1.6 mg/dL — ABNORMAL HIGH (ref 0.61–1.24)
GFR, Estimated: 43 mL/min — ABNORMAL LOW (ref >60)
Glucose, Bld: 113 mg/dL — ABNORMAL HIGH (ref 70–99)
Potassium: 4.2 mmol/L (ref 3.5–5.1)
Sodium: 139 mmol/L (ref 135–145)
Total Bilirubin: 0.8 mg/dL (ref 0.3–1.2)
Total Protein: 5 g/dL — ABNORMAL LOW (ref 6.5–8.1)

## 2023-03-21 LAB — CBC WITH DIFFERENTIAL/PLATELET
Abs Immature Granulocytes: 0.05 10*3/uL (ref 0.00–0.07)
Basophils Absolute: 0 10*3/uL (ref 0.0–0.1)
Basophils Relative: 0 %
Eosinophils Absolute: 0 10*3/uL (ref 0.0–0.5)
Eosinophils Relative: 1 %
HCT: 33.6 % — ABNORMAL LOW (ref 39.0–52.0)
Hemoglobin: 10.4 g/dL — ABNORMAL LOW (ref 13.0–17.0)
Immature Granulocytes: 1 %
Lymphocytes Relative: 16 %
Lymphs Abs: 1.4 10*3/uL (ref 0.7–4.0)
MCH: 32.5 pg (ref 26.0–34.0)
MCHC: 31 g/dL (ref 30.0–36.0)
MCV: 105 fL — ABNORMAL HIGH (ref 80.0–100.0)
Monocytes Absolute: 0.9 10*3/uL (ref 0.1–1.0)
Monocytes Relative: 10 %
Neutro Abs: 6.4 10*3/uL (ref 1.7–7.7)
Neutrophils Relative %: 72 %
Platelets: 218 10*3/uL (ref 150–400)
RBC: 3.2 MIL/uL — ABNORMAL LOW (ref 4.22–5.81)
RDW: 14 % (ref 11.5–15.5)
WBC: 8.8 10*3/uL (ref 4.0–10.5)
nRBC: 0 % (ref 0.0–0.2)

## 2023-03-21 LAB — BRAIN NATRIURETIC PEPTIDE: B Natriuretic Peptide: 1693.9 pg/mL — ABNORMAL HIGH (ref 0.0–100.0)

## 2023-03-21 LAB — TROPONIN I (HIGH SENSITIVITY)
Troponin I (High Sensitivity): 8240 ng/L (ref <18)
Troponin I (High Sensitivity): 16366 ng/L (ref <18)

## 2023-03-21 LAB — HEMOGLOBIN A1C
Hgb A1c MFr Bld: 5.2 % (ref 4.8–5.6)
Mean Plasma Glucose: 102.54 mg/dL

## 2023-03-21 LAB — PROTIME-INR
INR: 1.4 — ABNORMAL HIGH (ref 0.8–1.2)
Prothrombin Time: 17.3 s — ABNORMAL HIGH (ref 11.4–15.2)

## 2023-03-21 LAB — HEPARIN LEVEL (UNFRACTIONATED): Heparin Unfractionated: <0.1 [IU]/mL — ABNORMAL LOW (ref 0.30–0.70)

## 2023-03-21 LAB — I-STAT CG4 LACTIC ACID, ED: Lactic Acid, Venous: 2.2 mmol/L (ref 0.5–1.9)

## 2023-03-21 LAB — GLUCOSE, CAPILLARY: Glucose-Capillary: 123 mg/dL — ABNORMAL HIGH (ref 70–99)

## 2023-03-21 LAB — APTT: aPTT: 32 s (ref 24–36)

## 2023-03-21 MED ORDER — SODIUM CHLORIDE 0.9% FLUSH
10.0000 mL | Freq: Two times a day (BID) | INTRAVENOUS | Status: DC
  Administered 2023-03-21 (×2): 10 mL via INTRAVENOUS

## 2023-03-21 MED ORDER — RANOLAZINE ER 500 MG PO TB12
500.0000 mg | ORAL_TABLET | Freq: Two times a day (BID) | ORAL | Status: DC
  Administered 2023-03-21: 500 mg via ORAL
  Filled 2023-03-21 (×2): qty 1

## 2023-03-21 MED ORDER — ONDANSETRON HCL 4 MG/2ML IJ SOLN
4.0000 mg | Freq: Four times a day (QID) | INTRAMUSCULAR | Status: DC | PRN
  Administered 2023-03-21 – 2023-03-22 (×2): 4 mg via INTRAVENOUS
  Filled 2023-03-21 (×2): qty 2

## 2023-03-21 MED ORDER — NOREPINEPHRINE 4 MG/250ML-% IV SOLN
0.0000 ug/min | INTRAVENOUS | Status: DC
  Administered 2023-03-21: 2 ug/min via INTRAVENOUS
  Administered 2023-03-21: 19 ug/min via INTRAVENOUS
  Administered 2023-03-21 – 2023-03-22 (×3): 20 ug/min via INTRAVENOUS
  Administered 2023-03-22: 22 ug/min via INTRAVENOUS
  Filled 2023-03-21 (×6): qty 250

## 2023-03-21 MED ORDER — FENTANYL CITRATE PF 50 MCG/ML IJ SOSY
50.0000 ug | PREFILLED_SYRINGE | INTRAMUSCULAR | Status: DC | PRN
  Administered 2023-03-21 – 2023-03-22 (×5): 50 ug via INTRAVENOUS
  Filled 2023-03-21 (×5): qty 1

## 2023-03-21 MED ORDER — NITROGLYCERIN 0.4 MG SL SUBL: 0.4000 mg | SUBLINGUAL_TABLET | SUBLINGUAL | Status: DC | PRN

## 2023-03-21 MED ORDER — CLOPIDOGREL BISULFATE 75 MG PO TABS
75.0000 mg | ORAL_TABLET | Freq: Every day | ORAL | Status: DC
  Administered 2023-03-21: 75 mg via ORAL

## 2023-03-21 MED ORDER — HEPARIN SODIUM (PORCINE) 5000 UNIT/ML IJ SOLN
60.0000 [IU]/kg | Freq: Once | INTRAMUSCULAR | Status: AC
Start: 2023-03-21 — End: 2023-03-21
  Administered 2023-03-21: 3800 [IU] via INTRAVENOUS
  Filled 2023-03-21: qty 1

## 2023-03-21 MED ORDER — ASPIRIN 81 MG PO TBEC: 81.0000 mg | DELAYED_RELEASE_TABLET | Freq: Every day | ORAL | Status: DC

## 2023-03-21 MED ORDER — ACETAMINOPHEN 325 MG PO TABS
650.0000 mg | ORAL_TABLET | ORAL | Status: DC | PRN
Start: 2023-03-21 — End: 2023-03-22

## 2023-03-21 MED ORDER — HEPARIN (PORCINE) 25000 UT/250ML-% IV SOLN
1000.0000 [IU]/h | INTRAVENOUS | Status: DC
  Administered 2023-03-21: 800 [IU]/h via INTRAVENOUS
  Filled 2023-03-21: qty 250

## 2023-03-21 MED ORDER — HEPARIN BOLUS VIA INFUSION
2000.0000 [IU] | Freq: Once | INTRAVENOUS | Status: AC
  Administered 2023-03-21: 2000 [IU] via INTRAVENOUS
  Filled 2023-03-21: qty 2000

## 2023-03-21 MED ORDER — DOBUTAMINE-DEXTROSE 4-5 MG/ML-% IV SOLN
2.5000 ug/kg/min | INTRAVENOUS | Status: DC
  Administered 2023-03-21: 2.5 ug/kg/min via INTRAVENOUS
  Filled 2023-03-21: qty 250

## 2023-03-21 MED ORDER — ROSUVASTATIN CALCIUM 20 MG PO TABS
20.0000 mg | ORAL_TABLET | Freq: Every day | ORAL | Status: DC
  Administered 2023-03-21: 20 mg via ORAL

## 2023-03-21 MED ORDER — FENTANYL CITRATE PF 50 MCG/ML IJ SOSY
50.0000 ug | PREFILLED_SYRINGE | Freq: Once | INTRAMUSCULAR | Status: AC
  Administered 2023-03-21: 50 ug via INTRAVENOUS
  Filled 2023-03-21: qty 1

## 2023-03-21 MED ORDER — ASPIRIN 81 MG PO CHEW
324.0000 mg | CHEWABLE_TABLET | Freq: Once | ORAL | Status: DC
Start: 2023-03-21 — End: 2023-03-21
  Filled 2023-03-21: qty 4

## 2023-03-21 NOTE — H&P (Signed)
Expand All Collapse All      Cardiology History and Physical Exam    Patient ID: [name] Steele MRN: [id number]; DOB: 19-Nov-1940   Admit date: 03/31/2023 Date of Consult: 04/08/2023   PCP:  [name], MD              [facility] Providers Cardiologist:  Donato Schultz, MD          Patient Profile:    [name] Steele is a 82 y.o. 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 Andria Meuse.   History of Present Illness:    [name] Steele is known to me from prior hospital admission in May of this year.  He has critical left main and severe multivessel coronary artery disease.  He declined CABG in the context of his advanced age and comorbid conditions.  He was seen by palliative care and declined their outpatient services but decided on DNR status moving forward.  The patient was hospitalized again last month and conservative therapy was again recommended.  He was treated with 48 hours of IV heparin.  There was discussion about moving towards home hospice.  The patient reports he was in his usual state of health with significant functional limitation based on angina and exertional dyspnea until this morning.  At 4 AM today he developed substernal chest discomfort that has worsened throughout the day.  He presents to the emergency department with marked ST depression and hypotension consistent with severe myocardial ischemia and cardiogenic shock.  A code STEMI was initially paged out but after review of his records, CODE STATUS, and understanding that he is in a palliative situation, the code STEMI was canceled.  I personally interviewed the patient this afternoon.  He continues to have substernal chest discomfort.  My interview is limited by the fact that he is extremely hard of hearing.  He reports that he continues to have chest discomfort across the front of the chest that feels  like a heaviness and pressure-like sensation.  He is having trouble breathing.  States that he had some nausea and route by EMS, but otherwise no complaints.  Denies syncope.             Past Medical History:  Diagnosis Date   Arthritis     CAD (coronary artery disease) 10/05/2022    Cath 09/2022: 3v CAD, critical LM stenosis - Pt opted for med Rx only>>DNR   Chronic kidney disease, stage 3a (HCC)     Chronic urinary tract infection     Diabetes mellitus without complication (HCC)     Gout     Heart murmur     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    S/p non-STEMI 09/2022>> three-vessel and critical left main stenosis; patient opted for medical management // TTE 09/2022: EF 20-25, GR 2 DD, RVSP 59.1, trivial MR, mild-moderate TR, mild aortic stenosis, mean gradient 10, V-max 206 cm/s, RAP 15   History of non-ST elevation myocardial infarction (NSTEMI) 10/05/2022   Hypertension     ILD (interstitial lung disease) (HCC)      by CT 01/2021   Ischemic cardiomyopathy 10/10/2022   Mild aortic stenosis     Mild dilation of ascending aorta (HCC)     Osteoporosis                 Past Surgical History:  Procedure Laterality Date   AORTIC ARCH ANGIOGRAPHY N/A 10/05/2022    Procedure: AORTIC ARCH ANGIOGRAPHY;  Surgeon: Tonny Bollman, MD;  Location: [facility] INVASIVE CV LAB;  Service: Cardiovascular;  Laterality: N/A;   CHOLECYSTECTOMY N/A 07/04/2018    Procedure: LAPAROSCOPIC CHOLECYSTECTOMY WITH INTRAOPERATIVE CHOLANGIOGRAM;  Surgeon: Abigail Miyamoto, MD;  Location: [facility] ORS;  Service: General;  Laterality: N/A;   ERCP N/A 07/05/2018    Procedure: ENDOSCOPIC RETROGRADE CHOLANGIOPANCREATOGRAPHY (ERCP);  Surgeon: Vida Rigger, MD;  Location: [facility];  Service: Endoscopy;  Laterality: N/A;   IABP INSERTION N/A 10/05/2022    Procedure: IABP Insertion;  Surgeon: Tonny Bollman, MD;  Location: [facility] INVASIVE CV LAB;  Service: Cardiovascular;  Laterality: N/A;   INGUINAL  HERNIA REPAIR Right 07/31/2022    Procedure: OPEN RIGHT INGUINAL HERNIA REPAIR WITH MESH;  Surgeon: Berna Bue, MD;  Location: [facility] ORS;  Service: General;  Laterality: Right;   JOINT REPLACEMENT Bilateral     REMOVAL OF STONES   07/05/2018    Procedure: REMOVAL OF STONES;  Surgeon: Vida Rigger, MD;  Location: [facility] ENDOSCOPY;  Service: Endoscopy;;   RIGHT/LEFT HEART CATH AND CORONARY ANGIOGRAPHY N/A 10/05/2022    Procedure: RIGHT/LEFT HEART CATH AND CORONARY ANGIOGRAPHY;  Surgeon: Tonny Bollman, MD;  Location: [facility] INVASIVE CV LAB;  Service: Cardiovascular;  Laterality: N/A;   SPHINCTEROTOMY   07/05/2018    Procedure: SPHINCTEROTOMY;  Surgeon: Vida Rigger, MD;  Location: [facility] ENDOSCOPY;  Service: Endoscopy;;          Home Medications:         Prior to Admission medications   Medication Sig Start Date End Date Taking? Authorizing Provider  aspirin EC 81 MG tablet Take 1 tablet (81 mg total) by mouth daily. Swallow whole. 04/14/21     Jake Bathe, MD  clopidogrel (PLAVIX) 75 MG tablet Take 1 tablet (75 mg total) by mouth daily. 11/09/22 11/09/23   Jake Bathe, MD  furosemide (LASIX) 80 MG tablet Take 0.5 tablets (40 mg total) by mouth daily. 03/02/23     Marinda Elk, MD  ipratropium (ATROVENT) 0.06 % nasal spray Place 2 sprays into both nostrils 3 (three) times daily as needed for rhinitis. 12/05/21     [provider]  metFORMIN (GLUCOPHAGE-XR) 750 MG 24 hr tablet Take 750 mg by mouth daily after breakfast. 06/05/18     [provider]  metoprolol succinate (TOPROL-XL) 25 MG 24 hr tablet Take 0.5 tablets (12.5 mg total) by mouth daily. 11/02/22     Dyann Kief, PA-C  nitroGLYCERIN (NITROSTAT) 0.4 MG SL tablet Place 1 tablet (0.4 mg total) under the tongue every 5 (five) minutes x 3 doses as needed for chest pain. 10/16/22     Abagail Kitchens, PA-C  ranolazine (RANEXA) 500 MG 12 hr tablet Take 1 tablet (500 mg total) by mouth 2 (two) times daily. 11/09/22     Jake Bathe, MD  rosuvastatin (CRESTOR) 20 MG tablet Take 1 tablet (20 mg total) by mouth daily. 10/10/22 10/10/23   Tereso Newcomer T, PA-C  spironolactone (ALDACTONE) 25 MG tablet Take 0.5 tablets (12.5 mg total) by mouth daily. 11/11/22 04/24/23   Jake Bathe, MD      Inpatient Medications: Scheduled Meds:  sodium chloride flush  10 mL Intravenous Q12H        Continuous Infusions:  DOBUTamine 2.5 mcg/kg/min (04/09/2023 1158)   heparin     norepinephrine (LEVOPHED) Adult infusion 7 mcg/min (04/11/2023 1226)  PRN Meds:         Allergies:    Allergies       Allergies  Allergen Reactions   Ak-Mycin [Erythromycin] Other (See Comments)      Severe Abdominal Pain   Penicillins Hives        Social History:   Social History         Socioeconomic History   Marital status: Married      Spouse name: Alice   Number of children: 2   Years of education: Not on file   Highest education level: Not on file  Occupational History   Occupation: Retired  Tobacco Use   Smoking status: Former      Current packs/day: 0.00      Average packs/day: 1 pack/day for 31.0 years (31.0 ttl pk-yrs)      Types: Cigarettes      Start date: 71      Quit date: 1988      Years since quitting: 36.8   Smokeless tobacco: Never  Vaping Use   Vaping status: Never Used  Substance and Sexual Activity   Alcohol use: Yes      Alcohol/week: 0.0 standard drinks of alcohol      Comment: social   Drug use: No   Sexual activity: Not Currently  Other Topics Concern   Not on file  Social History Narrative   Not on file    Social Determinants of Health        Financial Resource Strain: Low Risk  (10/07/2022)    Overall Financial Resource Strain (CARDIA)     Difficulty of Paying Living Expenses: Not very hard  Food Insecurity: No Food Insecurity (02/28/2023)    Hunger Vital Sign     Worried About Running Out of Food in the Last Year: Never true     Ran Out of Food in the Last Year: Never true   Transportation Needs: No Transportation Needs (02/28/2023)    PRAPARE - Therapist, art (Medical): No     Lack of Transportation (Non-Medical): No  Physical Activity: Not on file  Stress: Not on file  Social Connections: Not on file  Intimate Partner Violence: Not At Risk (02/28/2023)    Humiliation, Afraid, Rape, and Kick questionnaire     Fear of Current or Ex-Partner: No     Emotionally Abused: No     Physically Abused: No     Sexually Abused: No    Family History:        Family History  Problem Relation Age of Onset   Heart disease Sister            ROS:  Please see the history of present illness.  Positive for generalized fatigue  and exercise intolerance. All other ROS reviewed and negative.      Physical Exam/Data:          Vitals:    03/24/2023 1215 03/23/2023 1220 04/15/2023 1225 04/07/2023 1228  BP: (!) 64/54 (!) 71/56 (!) 72/62 (!) 69/54  Pulse:          Resp: (!) 23 (!) 24 (!) 26 (!) 21  Temp:          TempSrc:          Weight:          Height:            No intake or output data in the 24 hours ending 04/12/2023 1233  04/05/2023   11:38 AM 03/16/2023    3:18 PM 03/02/2023    3:35 AM  Last 3 Weights  Weight (lbs) 140 lb 3.4 oz 140 lb 3.2 oz 135 lb 2.3 oz  Weight (kg) 63.6 kg 63.594 kg 61.3 kg     Body mass index is 20.12 kg/m.  General: Elderly male, very hard of hearing, no obvious distress HEENT: normal Neck: no JVD Vascular: No carotid bruits; Distal pulses 2+ bilaterally Cardiac:  normal S1, S2; RRR; no murmur, distant heart sounds.  I am unable to palpate distal pulses.  Femoral pulses are faint.  The femoral arteries are heavily calcified based on palpation. Lungs: Decreased air movement in the bases bilaterally, otherwise clear Abd: soft, nontender, no hepatomegaly  Ext: no edema Musculoskeletal:  No deformities, BUE and BLE strength normal and equal Skin: warm and dry  Neuro:  CNs 2-12 intact, no focal abnormalities  noted Psych:  Normal affect    EKG:  The EKG was personally reviewed and demonstrates: Sinus rhythm with PVCs, marked ST/T changes consistent with severe global ischemia   Telemetry:  Telemetry was personally reviewed and demonstrates: Normal sinus rhythm with frequent PVCs   Relevant CV Studies: Cardiac catheterization films Oct 05, 2022 personally reviewed and demonstrate critical left main stenosis, severe proximal and mid LAD stenosis, severe obtuse marginal stenosis, and severe segmental LV systolic dysfunction with LVEF 30%.   2D echocardiogram 10/05/2022 show severe LV dysfunction with LVEF 20 to 25%, normal RV function, and mild aortic stenosis.   Laboratory Data:   High Sensitivity Troponin:   Last Labs      Recent Labs  Lab 02/27/23 1323 02/27/23 1517  TROPONINIHS 38* 25*       Chemistry Last Labs  No results for input(s): "NA", "K", "CL", "CO2", "GLUCOSE", "BUN", "CREATININE", "CALCIUM", "MG", "GFRNONAA", "GFRAA", "ANIONGAP" in the last 168 hours.    Last Labs  No results for input(s): "PROT", "ALBUMIN", "AST", "ALT", "ALKPHOS", "BILITOT" in the last 168 hours.   Lipids  Last Labs  No results for input(s): "CHOL", "TRIG", "HDL", "LABVLDL", "LDLCALC", "CHOLHDL" in the last 168 hours.    Hematology Last Labs     Recent Labs  Lab 03/31/2023 1125  WBC 8.8  RBC 3.20*  HGB 10.4*  HCT 33.6*  MCV 105.0*  MCH 32.5  MCHC 31.0  RDW 14.0  PLT 218      Thyroid  Last Labs  No results for input(s): "TSH", "FREET4" in the last 168 hours.    BNP Last Labs  No results for input(s): "BNP", "PROBNP" in the last 168 hours.    DDimer  Last Labs  No results for input(s): "DDIMER" in the last 168 hours.       Radiology/Studies:  No results found.     Assessment and Plan:    STEMI likely involving the left mainstem with severe global ischemia on EKG Acute cardiogenic shock Lactic acidosis Interstitial lung disease (based on high-res CT findings) DNR    Unfortunately the patient has recurrent evidence of marked ischemia related to his critical left main and multivessel CAD.  I have gone back and reviewed the notes from May of this year when he underwent emergency cardiac catheterization with intra-aortic balloon pump placement.  At that time he underwent formal evaluation by cardiac surgery and was deemed a poor candidate for surgery.  The patient and family decided they would not proceed with CABG even if it were offered to them.  He was felt  to be at too high of risk for complex unprotected left main and multivessel PCI.  A palliative care approach was taken and the patient was made elected for DNR status.  He has been rehospitalized with acute coronary syndrome since that time and now presents with severe ischemia complicated by cardiogenic shock requiring inotropic therapy with norepinephrine and dobutamine.  I spoke with the patient and he understands that we really do not have anything else to offer him from a cardiac perspective.  With his severe underlying CAD and evidence of shock, I suspect this may be a terminal event for him.  I think we should focus on his comfort with analgesics for pain control.  It is reasonable to support him in the short-term for ongoing discussion regarding goals of care and potential move towards comfort care.  In the meantime it is reasonable to treat him with IV heparin as has been done in the past.  He is not a candidate for aggressive invasive procedures such as hemodynamic support devices in light of his nonrevascularizable CAD and palliative approach to care is recommended.  Will discuss these recommendations with his family upon their arrival as well.   The patient is critically ill with multiple organ systems failure and requires high complexity decision making for assessment and support, frequent evaluation and titration of therapies, application of advanced monitoring technologies and extensive interpretation of  multiple databases.    Critical Care Time devoted to patient care services described in this note is 60 minutes.    ADDNEDUM: I spoke with with patient's wife and updated her on her husband's critical condition. She understands that he is in critical condition and that while he is still alert, his condition is unstable and could change at any time. She is on her way here way here with her son now.      Risk Assessment/Risk Scores:     TIMI Risk Score for ST  Elevation MI:   The patient's TIMI risk score is 13, which indicates a 35.9% risk of all cause mortality at 30 days.    New York Heart Association (NYHA) Functional Class NYHA Class IV          For questions or updates, please contact [facility] Please consult [email or web address] for contact info under      Signed, Tonny Bollman, MD  03/20/2023 12:33 PM

## 2023-03-21 NOTE — Progress Notes (Signed)
PHARMACY - ANTICOAGULATION CONSULT NOTE  Pharmacy Consult for Heparin Indication: chest pain/ACS  Allergies  Allergen Reactions   Ak-Mycin [Erythromycin] Other (See Comments)    Severe Abdominal Pain   Penicillins Hives    Patient Measurements: Height: 5\' 10"  (177.8 cm) Weight: 63.6 kg (140 lb 3.4 oz) IBW/kg (Calculated) : 73 Heparin Dosing Weight: TBW  Vital Signs: Temp: 95.9 F (35.5 C) (11/03 2024) Temp Source: Axillary (11/03 2024) BP: 89/68 (11/03 1845) Pulse Rate: 95 (11/03 1845)  Labs: Recent Labs    03/27/2023 1125 03/23/2023 1753 03/23/2023 2003  HGB 10.4*  --   --   HCT 33.6*  --   --   PLT 218  --   --   APTT 32  --   --   LABPROT 17.3*  --   --   INR 1.4*  --   --   HEPARINUNFRC  --   --  <0.10*  CREATININE 1.60*  --   --   TROPONINIHS 8,240* 16,366*  --     Estimated Creatinine Clearance: 32 mL/min (A) (by C-G formula based on SCr of 1.6 mg/dL (H)).    Assessment: 45 YOM presenting with SOB and CP, hx of severe CAD, he is not on anticoagulation PTA. Pt on heparin for STEMI. Noted no interventions planned and palliative care to see him in the morning to help with transitions of care. Per MD note, this is likely a terminal event for the patient.  Heparin level undetectable on infusion at 800 units/hr. No issues with line or bleeding reported per RN.  Goal of Therapy:  Heparin level 0.3-0.7 units/ml Monitor platelets by anticoagulation protocol: Yes   Plan:  Rebolus heparin 1500 units IV Increase heparin gtt to 1000 units/hr F/u 8 hour heparin level Will f/u palliative consult in a.m. - anticipate heparin will be d/c then  Christoper Fabian, PharmD, BCPS Please see amion for complete clinical pharmacist phone list 04/10/2023 8:54 PM

## 2023-03-21 NOTE — Progress Notes (Signed)
PHARMACY - ANTICOAGULATION CONSULT NOTE  Pharmacy Consult for Heparin Indication: chest pain/ACS  Allergies  Allergen Reactions   Ak-Mycin [Erythromycin] Other (See Comments)    Severe Abdominal Pain   Penicillins Hives    Patient Measurements: Height: 5\' 10"  (177.8 cm) Weight: 63.6 kg (140 lb 3.4 oz) IBW/kg (Calculated) : 73 Heparin Dosing Weight: TBW  Vital Signs: Temp: 97.4 F (36.3 C) (11/03 1136) Temp Source: Oral (11/03 1136) BP: 65/53 (11/03 1203) Pulse Rate: 108 (11/03 1136)  Labs: Recent Labs    03/20/2023 1125  HGB 10.4*  HCT 33.6*  PLT 218    Estimated Creatinine Clearance: 41.7 mL/min (by C-G formula based on SCr of 1.23 mg/dL).   Medical History: Past Medical History:  Diagnosis Date   Arthritis    CAD (coronary artery disease) 10/05/2022   Cath 09/2022: 3v CAD, critical LM stenosis - Pt opted for med Rx only>>DNR   Chronic kidney disease, stage 3a (HCC)    Chronic urinary tract infection    Diabetes mellitus without complication (HCC)    Gout    Heart murmur    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   S/p non-STEMI 09/2022>> three-vessel and critical left main stenosis; patient opted for medical management // TTE 09/2022: EF 20-25, GR 2 DD, RVSP 59.1, trivial MR, mild-moderate TR, mild aortic stenosis, mean gradient 10, V-max 206 cm/s, RAP 15   History of non-ST elevation myocardial infarction (NSTEMI) 10/05/2022   Hypertension    ILD (interstitial lung disease) (HCC)    by CT 01/2021   Ischemic cardiomyopathy 10/10/2022   Mild aortic stenosis    Mild dilation of ascending aorta (HCC)    Osteoporosis     Assessment: 29 YOM presenting with SOB and CP, hx of severe CAD, he is not on anticoagulation PTA, heparin bolus given as part of code STEMI  Goal of Therapy:  Heparin level 0.3-0.7 units/ml Monitor platelets by anticoagulation protocol: Yes   Plan:  Continue heparin with gtt at 800 units/hr F/u 8 hour heparin  level  Daylene Posey, PharmD, [facility] Clinical Pharmacist ED Pharmacist Phone # [phone] 03/28/2023 12:10 PM

## 2023-03-21 NOTE — ED Notes (Signed)
ED TO INPATIENT HANDOFF REPORT  ED Nurse Name and Phone #: Osvaldo Shipper RN [phone]  S Name/Age/Gender [name] Steele 82 y.o. male Room/Bed: 010C/010C  Code Status   Code Status: Do not attempt resuscitation (DNR) PRE-ARREST INTERVENTIONS DESIRED  Home/SNF/Other Home Patient oriented to: self, place, time, and situation Is this baseline? 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 PT took 2 nitroglycerin and 324 ASA with pain relief then pain returned and he called 911. Pt reported  with EMS and was given 4mg  zofran and 300cc NS.    Allergies Allergies  Allergen Reactions   Ak-Mycin [Erythromycin] Other (See Comments)    Severe Abdominal Pain   Penicillins Hives    Level of Care/Admitting Diagnosis ED Disposition     ED Disposition  Admit   Condition  --   Comment  Hospital Area: [facility] [[id number]]  Level of Care: ICU [6]  May admit patient to [facility] or [facility] if equivalent level of care is available:: No  Covid Evaluation: Asymptomatic - no recent exposure (last 10 days) testing not required  Diagnosis: STEMI involving left main coronary artery [facility]) [[id number]]  Admitting Physician: Tonny Bollman [[id number]]  Attending Physician: Tonny Bollman [[id number]]  Certification:: I certify this patient will need inpatient services for at least 2 midnights  Expected Medical Readiness: 03/24/2023          B Medical/Surgery History Past Medical History:  Diagnosis Date   Arthritis    CAD (coronary artery disease) 10/05/2022   Cath 09/2022: 3v CAD, critical LM stenosis - Pt opted for med Rx only>>DNR   Chronic kidney disease, stage 3a (HCC)    Chronic urinary tract infection    Diabetes mellitus without complication (HCC)    Gout    Heart murmur    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   S/p  non-STEMI 09/2022>> three-vessel and critical left main stenosis; patient opted for medical management // TTE 09/2022: EF 20-25, GR 2 DD, RVSP 59.1, trivial MR, mild-moderate TR, mild aortic stenosis, mean gradient 10, V-max 206 cm/s, RAP 15   History of non-ST elevation myocardial infarction (NSTEMI) 10/05/2022   Hypertension    ILD (interstitial lung disease) (HCC)    by CT 01/2021   Ischemic cardiomyopathy 10/10/2022   Mild aortic stenosis    Mild dilation of ascending aorta (HCC)    Osteoporosis    Past Surgical History:  Procedure Laterality Date   AORTIC ARCH ANGIOGRAPHY N/A 10/05/2022   Procedure: AORTIC ARCH ANGIOGRAPHY;  Surgeon: Tonny Bollman, MD;  Location: [facility] INVASIVE CV LAB;  Service: Cardiovascular;  Laterality: N/A;   CHOLECYSTECTOMY N/A 07/04/2018   Procedure: LAPAROSCOPIC CHOLECYSTECTOMY WITH INTRAOPERATIVE CHOLANGIOGRAM;  Surgeon: Abigail Miyamoto, MD;  Location: [facility] ORS;  Service: General;  Laterality: N/A;   ERCP N/A 07/05/2018   Procedure: ENDOSCOPIC RETROGRADE CHOLANGIOPANCREATOGRAPHY (ERCP);  Surgeon: Vida Rigger, MD;  Location: [facility] ENDOSCOPY;  Service: Endoscopy;  Laterality: N/A;   IABP INSERTION N/A 10/05/2022   Procedure: IABP Insertion;  Surgeon: Tonny Bollman, MD;  Location: [facility] INVASIVE CV LAB;  Service: Cardiovascular;  Laterality: N/A;   INGUINAL HERNIA REPAIR Right 07/31/2022   Procedure: OPEN RIGHT INGUINAL HERNIA REPAIR WITH MESH;  Surgeon: Berna Bue, MD;  Location: [facility] ORS;  Service: General;  Laterality: Right;   JOINT REPLACEMENT Bilateral    REMOVAL OF STONES  07/05/2018   Procedure: REMOVAL OF STONES;  Surgeon: Vida Rigger, MD;  Location: [facility] ENDOSCOPY;  Service: Endoscopy;;   RIGHT/LEFT HEART CATH AND CORONARY ANGIOGRAPHY N/A 10/05/2022   Procedure: RIGHT/LEFT HEART CATH AND CORONARY ANGIOGRAPHY;  Surgeon: Tonny Bollman, MD;  Location: [facility] INVASIVE CV LAB;  Service: Cardiovascular;  Laterality: N/A;   SPHINCTEROTOMY  07/05/2018   Procedure:  SPHINCTEROTOMY;  Surgeon: Vida Rigger, MD;  Location: [facility] ENDOSCOPY;  Service: Endoscopy;;     A IV Location/Drains/Wounds Patient Lines/Drains/Airways Status     Active Line/Drains/Airways     Name Placement date Placement time Site Days   Peripheral IV 03/25/2023 20 G Posterior;Right Forearm 04/08/2023  1140  Forearm  less than 1   Peripheral IV 04/06/2023 18 G Anterior;Left;Upper Arm 03/25/2023  1250  Arm  less than 1            Intake/Output Last 24 hours No intake or output data in the 24 hours ending 03/20/2023 1413  Labs/Imaging Results for orders placed or performed during the hospital encounter of 03/29/2023 (from the past 48 hour(s))  CBC with Differential     Status: Abnormal   Collection Time: 04/17/2023 11:25 AM  Result Value Ref Range   WBC 8.8 4.0 - 10.5 K/uL   RBC 3.20 (L) 4.22 - 5.81 MIL/uL   Hemoglobin 10.4 (L) 13.0 - 17.0 g/dL   HCT 45.4 (L) 09.8 - 11.9 %   MCV 105.0 (H) 80.0 - 100.0 fL   MCH 32.5 26.0 - 34.0 pg   MCHC 31.0 30.0 - 36.0 g/dL   RDW 14.7 82.9 - 56.2 %   Platelets 218 150 - 400 K/uL   nRBC 0.0 0.0 - 0.2 %   Neutrophils Relative % 72 %   Neutro Abs 6.4 1.7 - 7.7 K/uL   Lymphocytes Relative 16 %   Lymphs Abs 1.4 0.7 - 4.0 K/uL   Monocytes Relative 10 %   Monocytes Absolute 0.9 0.1 - 1.0 K/uL   Eosinophils Relative 1 %   Eosinophils Absolute 0.0 0.0 - 0.5 K/uL   Basophils Relative 0 %   Basophils Absolute 0.0 0.0 - 0.1 K/uL   Immature Granulocytes 1 %   Abs Immature Granulocytes 0.05 0.00 - 0.07 K/uL    Comment: Performed at [facility], [address] [place], [place] [address]  Comprehensive metabolic panel     Status: Abnormal   Collection Time: 04/02/2023 11:25 AM  Result Value Ref Range   Sodium 139 135 - 145 mmol/L   Potassium 4.2 3.5 - 5.1 mmol/L   Chloride 107 98 - 111 mmol/L   CO2 20 (L) 22 - 32 mmol/L   Glucose, Bld 113 (H) 70 - 99 mg/dL    Comment: Glucose reference range applies only to samples taken after fasting for at least  8 hours.   BUN 29 (H) 8 - 23 mg/dL   Creatinine, Ser 5.78 (H) 0.61 - 1.24 mg/dL   Calcium 8.0 (L) 8.9 - 10.3 mg/dL   Total Protein 5.0 (L) 6.5 - 8.1 g/dL   Albumin 2.8 (L) 3.5 - 5.0 g/dL   AST 44 (H) 15 - 41 U/L   ALT 13 0 - 44 U/L   Alkaline Phosphatase 31 (L) 38 - 126 U/L   Total Bilirubin 0.8 0.3 - 1.2 mg/dL   GFR, Estimated 43 (L) >60 mL/min    Comment: (NOTE) Calculated using the CKD-EPI Creatinine Equation (2021)    Anion gap 12 5 - 15  Comment: Performed at [facility], [address], [place], [place] [address]  Troponin I (High Sensitivity)     Status: Abnormal   Collection Time: 03/25/2023 11:25 AM  Result Value Ref Range   Troponin I (High Sensitivity) 8,240 (HH) <18 ng/L    Comment: CRITICAL RESULT CALLED TO CASSANDRA K, RN, READ BACK BY AND VERIFIED WITH W SMITH AT 1251 ON 11.3.2024 (NOTE) Elevated high sensitivity troponin I (hsTnI) values and significant  changes across serial measurements may suggest ACS but many other  chronic and acute conditions are known to elevate hsTnI results.  Refer to the "Links" section for chest pain algorithms and additional  guidance. Performed at [facility], [address] [place], [place] [address]   Brain natriuretic peptide     Status: Abnormal   Collection Time: 03/26/2023 11:25 AM  Result Value Ref Range   B Natriuretic Peptide 1,693.9 (H) 0.0 - 100.0 pg/mL    Comment: Performed at [facility], [address] [place], [place] [address]  Hemoglobin A1c     Status: None   Collection Time: 04/09/2023 11:25 AM  Result Value Ref Range   Hgb A1c MFr Bld 5.2 4.8 - 5.6 %    Comment: (NOTE) Pre diabetes:          5.7%-6.4%  Diabetes:              >6.4%  Glycemic control for   <7.0% adults with diabetes    Mean Plasma Glucose 102.54 mg/dL    Comment: Performed at [facility], [address] [place], [place] [address]  Protime-INR     Status: Abnormal   Collection Time: 03/26/2023 11:25 AM  Result  Value Ref Range   Prothrombin Time 17.3 (H) 11.4 - 15.2 seconds   INR 1.4 (H) 0.8 - 1.2    Comment: (NOTE) INR goal varies based on device and disease states. Performed at [facility], [address], [place], [place] [address]   APTT     Status: None   Collection Time: 04/14/2023 11:25 AM  Result Value Ref Range   aPTT 32 24 - 36 seconds    Comment: Performed at [facility], [address], [place], [place] [address]  Lipid panel     Status: Abnormal   Collection Time: 03/23/2023 11:25 AM  Result Value Ref Range   Cholesterol 76 0 - 200 mg/dL   Triglycerides 82 <725 mg/dL   HDL 37 (L) >36 mg/dL   Total CHOL/HDL Ratio 2.1 RATIO   VLDL 16 0 - 40 mg/dL   LDL Cholesterol 23 0 - 99 mg/dL    Comment:        Total Cholesterol/HDL:CHD Risk Coronary Heart Disease Risk Table                     Men   Women  1/2 Average Risk   3.4   3.3  Average Risk       5.0   4.4  2 X Average Risk   9.6   7.1  3 X Average Risk  23.4   11.0        Use the calculated Patient Ratio above and the CHD Risk Table to determine the patient's CHD Risk.        ATP III CLASSIFICATION (LDL):  <100     mg/dL   Optimal  644-034  mg/dL   Near or Above  Optimal  130-159  mg/dL   Borderline  161-096  mg/dL   High  >045     mg/dL   Very High Performed at [facility], [address], [place], [place] [address]   I-Stat CG4 Lactic Acid, ED     Status: Abnormal   Collection Time: 04/09/2023 11:47 AM  Result Value Ref Range   Lactic Acid, Venous 2.2 (HH) 0.5 - 1.9 mmol/L   Comment NOTIFIED PHYSICIAN    No results found.  Pending Labs Unresulted Labs (From admission, onward)     Start     Ordered   03/23/2023 0500  Heparin level (unfractionated)  Daily,   R     Placed in "And" Linked Group   03/27/2023 1211   03/31/2023 0500  CBC  Daily,   R     Placed in "And" Linked Group   03/25/2023 1211   03/26/2023 2000  Heparin level (unfractionated)  Once-Timed,   URGENT        03/30/2023  1211   Signed and Held  Basic metabolic panel  Tomorrow morning,   R        Signed and Held            Vitals/Pain Today's Vitals   04/16/2023 1355 04/01/2023 1400 04/09/2023 1405 03/23/2023 1410  BP: 90/75 90/68 (!) 75/61 (!) 82/72  Pulse:      Resp: (!) 29 (!) 32 (!) 24 (!) 25  Temp:      TempSrc:      Weight:      Height:      PainSc:        Isolation Precautions No active isolations  Medications Medications  sodium chloride flush (NS) 0.9 % injection 10 mL (10 mLs Intravenous Given 04/16/2023 1145)  DOBUTamine (DOBUTREX) infusion 4000 mcg/mL (5 mcg/kg/min  63.6 kg Intravenous Rate/Dose Change 03/26/2023 1300)  norepinephrine (LEVOPHED) 4mg  in (0.016 mg/mL) premix infusion (20 mcg/min Intravenous Rate/Dose Change 04/01/2023 1320)  heparin ADULT infusion 100 units/mL (25000 units/267mL) (has no administration in time range)  fentaNYL (SUBLIMAZE) injection 50 mcg (has no administration in time range)  heparin injection 3,800 Units (3,800 Units Intravenous Given 03/28/2023 1144)  fentaNYL (SUBLIMAZE) injection 50 mcg (50 mcg Intravenous Given 04/05/2023 1400)    Mobility non-ambulatory     Focused Assessments Cardiac Assessment Handoff:  Cardiac Rhythm: Other (Comment) (STEMI) Lab Results  Component Value Date   CKTOTAL 152 10/06/2008   CKMB 2.9 10/06/2008   TROPONINI <0.03 12/15/2017   Lab Results  Component Value Date   DDIMER 0.97 (H) 12/15/2017   Does the Patient currently have chest pain? Yes    R Recommendations: See Admitting Provider Note  Report given to:   Additional Notes: DNR

## 2023-03-21 NOTE — ED Notes (Signed)
RN aware of BP 

## 2023-03-21 NOTE — Progress Notes (Signed)
Pt. On 20 of levo through PIV. IV watch is in place. MD made aware. Plan is to make it through the night for palliative to come in the AM.

## 2023-03-21 NOTE — Consult Note (Deleted)
Cardiology Consultation   Patient ID: [name] Steele MRN: [id number]; DOB: March 16, 1941  Admit date: 04/04/2023 Date of Consult: 04/06/2023  PCP:  [name] Brunette, MD   [facility] Providers Cardiologist:  [name] Schultz, MD        Patient Profile:   [name] Steele is a 82 y.o. 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 Andria Meuse.  History of Present Illness:   Mr. [name] Steele is known to me from prior hospital admission in May of this year.  He has critical left main and severe multivessel coronary artery disease.  He declined CABG in the context of his advanced age and comorbid conditions.  He was seen by palliative care and declined their outpatient services but decided on DNR status moving forward.  The patient was hospitalized again last month and conservative therapy was again recommended.  He was treated with 48 hours of IV heparin.  There was discussion about moving towards home hospice.  The patient reports he was in his usual state of health with significant functional limitation based on angina and exertional dyspnea until this morning.  At 4 AM today he developed substernal chest discomfort that has worsened throughout the day.  He presents to the emergency department with marked ST depression and hypotension consistent with severe myocardial ischemia and cardiogenic shock.  A code STEMI was initially paged out but after review of his records, CODE STATUS, and understanding that he is in a palliative situation, the code STEMI was canceled.  I personally interviewed the patient this afternoon.  He continues to have substernal chest discomfort.  My interview is limited by the fact that he is extremely hard of hearing.  He reports that he continues to have chest discomfort across the front of the chest that feels like a heaviness and pressure-like sensation.  He is having  trouble breathing.  States that he had some nausea and route by EMS, but otherwise no complaints.  Denies syncope.     Past Medical History:  Diagnosis Date   Arthritis    CAD (coronary artery disease) 10/05/2022   Cath 09/2022: 3v CAD, critical LM stenosis - Pt opted for med Rx only>>DNR   Chronic kidney disease, stage 3a (HCC)    Chronic urinary tract infection    Diabetes mellitus without complication (HCC)    Gout    Heart murmur    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   S/p non-STEMI 09/2022>> three-vessel and critical left main stenosis; patient opted for medical management // TTE 09/2022: EF 20-25, GR 2 DD, RVSP 59.1, trivial MR, mild-moderate TR, mild aortic stenosis, mean gradient 10, V-max 206 cm/s, RAP 15   History of non-ST elevation myocardial infarction (NSTEMI) 10/05/2022   Hypertension    ILD (interstitial lung disease) (HCC)    by CT 01/2021   Ischemic cardiomyopathy 10/10/2022   Mild aortic stenosis    Mild dilation of ascending aorta (HCC)    Osteoporosis     Past Surgical History:  Procedure Laterality Date   AORTIC ARCH ANGIOGRAPHY N/A 10/05/2022   Procedure: AORTIC ARCH ANGIOGRAPHY;  Surgeon: Tonny Bollman, MD;  Location: [facility] INVASIVE CV LAB;  Service: Cardiovascular;  Laterality: N/A;   CHOLECYSTECTOMY N/A 07/04/2018   Procedure: LAPAROSCOPIC CHOLECYSTECTOMY WITH INTRAOPERATIVE CHOLANGIOGRAM;  Surgeon: Abigail Miyamoto, MD;  Location: [facility] ORS;  Service: General;  Laterality: N/A;  ERCP N/A 07/05/2018   Procedure: ENDOSCOPIC RETROGRADE CHOLANGIOPANCREATOGRAPHY (ERCP);  Surgeon: Vida Rigger, MD;  Location: [facility]OSCOPY;  Service: Endoscopy;  Laterality: N/A;   IABP INSERTION N/A 10/05/2022   Procedure: IABP Insertion;  Surgeon: Tonny Bollman, MD;  Location: [facility] INVASIVE CV LAB;  Service: Cardiovascular;  Laterality: N/A;   INGUINAL HERNIA REPAIR Right 07/31/2022   Procedure: OPEN RIGHT INGUINAL HERNIA REPAIR WITH MESH;  Surgeon:  Berna Bue, MD;  Location: [facility] ORS;  Service: General;  Laterality: Right;   JOINT REPLACEMENT Bilateral    REMOVAL OF STONES  07/05/2018   Procedure: REMOVAL OF STONES;  Surgeon: Vida Rigger, MD;  Location: [facility] ENDOSCOPY;  Service: Endoscopy;;   RIGHT/LEFT HEART CATH AND CORONARY ANGIOGRAPHY N/A 10/05/2022   Procedure: RIGHT/LEFT HEART CATH AND CORONARY ANGIOGRAPHY;  Surgeon: Tonny Bollman, MD;  Location: [facility] INVASIVE CV LAB;  Service: Cardiovascular;  Laterality: N/A;   SPHINCTEROTOMY  07/05/2018   Procedure: SPHINCTEROTOMY;  Surgeon: Vida Rigger, MD;  Location: [facility] ENDOSCOPY;  Service: Endoscopy;;     Home Medications:  Prior to Admission medications   Medication Sig Start Date End Date Taking? Authorizing Provider  aspirin EC 81 MG tablet Take 1 tablet (81 mg total) by mouth daily. Swallow whole. 04/14/21   Jake Bathe, MD  clopidogrel (PLAVIX) 75 MG tablet Take 1 tablet (75 mg total) by mouth daily. 11/09/22 11/09/23  Jake Bathe, MD  furosemide (LASIX) 80 MG tablet Take 0.5 tablets (40 mg total) by mouth daily. 03/02/23   Marinda Elk, MD  ipratropium (ATROVENT) 0.06 % nasal spray Place 2 sprays into both nostrils 3 (three) times daily as needed for rhinitis. 12/05/21   [provider]  metFORMIN (GLUCOPHAGE-XR) 750 MG 24 hr tablet Take 750 mg by mouth daily after breakfast. 06/05/18   [provider]  metoprolol succinate (TOPROL-XL) 25 MG 24 hr tablet Take 0.5 tablets (12.5 mg total) by mouth daily. 11/02/22   Dyann Kief, PA-C  nitroGLYCERIN (NITROSTAT) 0.4 MG SL tablet Place 1 tablet (0.4 mg total) under the tongue every 5 (five) minutes x 3 doses as needed for chest pain. 10/16/22   Abagail Kitchens, PA-C  ranolazine (RANEXA) 500 MG 12 hr tablet Take 1 tablet (500 mg total) by mouth 2 (two) times daily. 11/09/22   Jake Bathe, MD  rosuvastatin (CRESTOR) 20 MG tablet Take 1 tablet (20 mg total) by mouth daily. 10/10/22 10/10/23  Tereso Newcomer T, PA-C   spironolactone (ALDACTONE) 25 MG tablet Take 0.5 tablets (12.5 mg total) by mouth daily. 11/11/22 04/24/23  Jake Bathe, MD    Inpatient Medications: Scheduled Meds:  sodium chloride flush  10 mL Intravenous Q12H   Continuous Infusions:  DOBUTamine 2.5 mcg/kg/min (04/16/2023 1158)   heparin     norepinephrine (LEVOPHED) Adult infusion 7 mcg/min (04/13/2023 1226)   PRN Meds:   Allergies:    Allergies  Allergen Reactions   Ak-Mycin [Erythromycin] Other (See Comments)    Severe Abdominal Pain   Penicillins Hives    Social History:   Social History   Socioeconomic History   Marital status: Married    Spouse name: Alice   Number of children: 2   Years of education: Not on file   Highest education level: Not on file  Occupational History   Occupation: Retired  Tobacco Use   Smoking status: Former    Current packs/day: 0.00    Average packs/day: 1 pack/day for 31.0 years (31.0 ttl pk-yrs)  Types: Cigarettes    Start date: 79    Quit date: 1988    Years since quitting: 36.8   Smokeless tobacco: Never  Vaping Use   Vaping status: Never Used  Substance and Sexual Activity   Alcohol use: Yes    Alcohol/week: 0.0 standard drinks of alcohol    Comment: social   Drug use: No   Sexual activity: Not Currently  Other Topics Concern   Not on file  Social History Narrative   Not on file   Social Determinants of Health   Financial Resource Strain: Low Risk  (10/07/2022)   Overall Financial Resource Strain (CARDIA)    Difficulty of Paying Living Expenses: Not very hard  Food Insecurity: No Food Insecurity (02/28/2023)   Hunger Vital Sign    Worried About Running Out of Food in the Last Year: Never true    Ran Out of Food in the Last Year: Never true  Transportation Needs: No Transportation Needs (02/28/2023)   PRAPARE - Administrator, Civil Service (Medical): No    Lack of Transportation (Non-Medical): No  Physical Activity: Not on file  Stress: Not on  file  Social Connections: Not on file  Intimate Partner Violence: Not At Risk (02/28/2023)   Humiliation, Afraid, Rape, and Kick questionnaire    Fear of Current or Ex-Partner: No    Emotionally Abused: No    Physically Abused: No    Sexually Abused: No    Family History:   Family History  Problem Relation Age of Onset   Heart disease Sister      ROS:  Please see the history of present illness.  Positive for generalized fatigue  and exercise intolerance. All other ROS reviewed and negative.     Physical Exam/Data:   Vitals:   04/15/2023 1215 03/30/2023 1220 04/14/2023 1225 03/25/2023 1228  BP: (!) 64/54 (!) 71/56 (!) 72/62 (!) 69/54  Pulse:      Resp: (!) 23 (!) 24 (!) 26 (!) 21  Temp:      TempSrc:      Weight:      Height:       No intake or output data in the 24 hours ending 04/14/2023 1233    04/11/2023   11:38 AM 03/16/2023    3:18 PM 03/02/2023    3:35 AM  Last 3 Weights  Weight (lbs) 140 lb 3.4 oz 140 lb 3.2 oz 135 lb 2.3 oz  Weight (kg) 63.6 kg 63.594 kg 61.3 kg     Body mass index is 20.12 kg/m.  General: Elderly male, very hard of hearing, no obvious distress HEENT: normal Neck: no JVD Vascular: No carotid bruits; Distal pulses 2+ bilaterally Cardiac:  normal S1, S2; RRR; no murmur, distant heart sounds.  I am unable to palpate distal pulses.  Femoral pulses are faint.  The femoral arteries are heavily calcified based on palpation. Lungs: Decreased air movement in the bases bilaterally, otherwise clear Abd: soft, nontender, no hepatomegaly  Ext: no edema Musculoskeletal:  No deformities, BUE and BLE strength normal and equal Skin: warm and dry  Neuro:  CNs 2-12 intact, no focal abnormalities noted Psych:  Normal affect   EKG:  The EKG was personally reviewed and demonstrates: Sinus rhythm with PVCs, marked ST/T changes consistent with severe global ischemia  Telemetry:  Telemetry was personally reviewed and demonstrates: Normal sinus rhythm with frequent  PVCs  Relevant CV Studies: Cardiac catheterization films Oct 05, 2022 personally reviewed and demonstrate critical  left main stenosis, severe proximal and mid LAD stenosis, severe obtuse marginal stenosis, and severe segmental LV systolic dysfunction with LVEF 30%.  2D echocardiogram 10/05/2022 show severe LV dysfunction with LVEF 20 to 25%, normal RV function, and mild aortic stenosis.  Laboratory Data:  High Sensitivity Troponin:   Recent Labs  Lab 02/27/23 1323 02/27/23 1517  TROPONINIHS 38* 25*     ChemistryNo results for input(s): "NA", "K", "CL", "CO2", "GLUCOSE", "BUN", "CREATININE", "CALCIUM", "MG", "GFRNONAA", "GFRAA", "ANIONGAP" in the last 168 hours.  No results for input(s): "PROT", "ALBUMIN", "AST", "ALT", "ALKPHOS", "BILITOT" in the last 168 hours. Lipids No results for input(s): "CHOL", "TRIG", "HDL", "LABVLDL", "LDLCALC", "CHOLHDL" in the last 168 hours.  Hematology Recent Labs  Lab 04/04/2023 1125  WBC 8.8  RBC 3.20*  HGB 10.4*  HCT 33.6*  MCV 105.0*  MCH 32.5  MCHC 31.0  RDW 14.0  PLT 218   Thyroid No results for input(s): "TSH", "FREET4" in the last 168 hours.  BNPNo results for input(s): "BNP", "PROBNP" in the last 168 hours.  DDimer No results for input(s): "DDIMER" in the last 168 hours.   Radiology/Studies:  No results found.   Assessment and Plan:   STEMI likely involving the left mainstem with severe global ischemia on EKG Acute cardiogenic shock Lactic acidosis Interstitial lung disease (based on high-res CT findings) DNR  Unfortunately the patient has recurrent evidence of marked ischemia related to his critical left main and multivessel CAD.  I have gone back and reviewed the notes from May of this year when he underwent emergency cardiac catheterization with intra-aortic balloon pump placement.  At that time he underwent formal evaluation by cardiac surgery and was deemed a poor candidate for surgery.  The patient and family decided they  would not proceed with CABG even if it were offered to them.  He was felt to be at too high of risk for complex unprotected left main and multivessel PCI.  A palliative care approach was taken and the patient was made elected for DNR status.  He has been rehospitalized with acute coronary syndrome since that time and now presents with severe ischemia complicated by cardiogenic shock requiring inotropic therapy with norepinephrine and dobutamine.  I spoke with the patient and he understands that we really do not have anything else to offer him from a cardiac perspective.  With his severe underlying CAD and evidence of shock, I suspect this may be a terminal event for him.  I think we should focus on his comfort with analgesics for pain control.  It is reasonable to support him in the short-term for ongoing discussion regarding goals of care and potential move towards comfort care.  In the meantime it is reasonable to treat him with IV heparin as has been done in the past.  He is not a candidate for aggressive invasive procedures such as hemodynamic support devices in light of his nonrevascularizable CAD and palliative approach to care is recommended.  Will discuss these recommendations with his family upon their arrival as well.  The patient is critically ill with multiple organ systems failure and requires high complexity decision making for assessment and support, frequent evaluation and titration of therapies, application of advanced monitoring technologies and extensive interpretation of multiple databases.   Critical Care Time devoted to patient care services described in this note is 60 minutes.   ADDNEDUM: I spoke with with patient's wife and updated her on her husband's critical condition. She understands that he is in  critical condition and that while he is still alert, his condition is unstable and could change at any time. She is on her way here way here with her son now.    Risk Assessment/Risk  Scores:     TIMI Risk Score for ST  Elevation MI:   The patient's TIMI risk score is 13, which indicates a 35.9% risk of all cause mortality at 30 days.   New York Heart Association (NYHA) Functional Class NYHA Class IV        For questions or updates, please contact [facility] Please consult [email or web address] for contact info under    Signed, Tonny Bollman, MD  04/16/2023 12:33 PM

## 2023-03-21 NOTE — Plan of Care (Signed)
  Problem: Education: Goal: Knowledge of General Education information will improve Description: Including pain rating scale, medication(s)/side effects and non-pharmacologic comfort measures Outcome: Progressing   Problem: Health Behavior/Discharge Planning: Goal: Ability to manage health-related needs will improve Outcome: Progressing   Problem: Clinical Measurements: Goal: Ability to maintain clinical measurements within normal limits will improve Outcome: Progressing Goal: Will remain free from infection Outcome: Progressing Goal: Diagnostic test results will improve Outcome: Progressing Goal: Respiratory complications will improve Outcome: Progressing Goal: Cardiovascular complication will be avoided Outcome: Progressing   Problem: Activity: Goal: Risk for activity intolerance will decrease Outcome: Progressing   Problem: Nutrition: Goal: Adequate nutrition will be maintained Outcome: Progressing   Problem: Coping: Goal: Level of anxiety will decrease Outcome: Progressing   Problem: Elimination: Goal: Will not experience complications related to bowel motility Outcome: Progressing Goal: Will not experience complications related to urinary retention Outcome: Progressing   Problem: Pain Management: Goal: General experience of comfort will improve Outcome: Progressing   Problem: Safety: Goal: Ability to remain free from injury will improve Outcome: Progressing   Problem: Skin Integrity: Goal: Risk for impaired skin integrity will decrease Outcome: Progressing   Problem: Education: Goal: Understanding of cardiac disease, CV risk reduction, and recovery process will improve Outcome: Progressing Goal: Individualized Educational Video(s) Outcome: Progressing   Problem: Activity: Goal: Ability to tolerate increased activity will improve Outcome: Progressing   Problem: Cardiac: Goal: Ability to achieve and maintain adequate cardiovascular perfusion will  improve Outcome: Progressing   Problem: Health Behavior/Discharge Planning: Goal: Ability to safely manage health-related needs after discharge will improve Outcome: Progressing

## 2023-03-21 NOTE — Progress Notes (Signed)
Talked patient's RN regarding placing 3rd PIV access. Patient's RN stated that patient may go to comfort care and doesn't need PIV access at this time. HS [employer]

## 2023-03-21 NOTE — Progress Notes (Signed)
Per Dr. Excell Seltzer asked to see to discuss with family any questions or concerns.  Essentially wife and other family members wanted to know what the next steps were.  I discussed very and definitely the plan for palliative care and honoring patient's goals of care.  He has made it very clear that he does not want to stay in the hospital and would prefer to go home.  We discussed that he is critically ill and requiring significant ICU level care and that his prognosis overall is not great but we would honor his wishes and his goals of care along with his family members.  Palliative care consult has been placed.  RN will also call to ensure they will see tomorrow.  Overall patient and wife very receptive and agreeable to current plan.

## 2023-03-21 NOTE — ED Triage Notes (Signed)
Pt BIB EMS from home for CP and SOB. PT took 2 nitroglycerin and 324 ASA with pain relief then pain returned and he called 911. Pt reported  with EMS and was given 4mg  zofran and 300cc NS.

## 2023-03-21 NOTE — ED Notes (Signed)
EM provider at bedside speaking with family.

## 2023-03-21 NOTE — Progress Notes (Signed)
   04/10/2023 2129  Spiritual Encounters  Type of Visit Initial  Care provided to: Pt and family  Conversation partners present during encounter Nurse  Referral source Nurse (RN/NT/LPN)  Reason for visit End-of-life  OnCall Visit Yes   Chaplain responding to call from RN that Pt and wife could use some support, as Pt is on a number of life support measures and does ot have long left.  Chaplain arrived to find Pt in bed and wofe of 63 years at bedside. Chaplain worked to establish a relationship of care and support through compassionate presence and reflective listening.  Chaplain asked open ended questions to encourage story telling and life narrative.  Pt was able to speak a good bit and shared much with the chaplain.  Chaplain facilitated faith exploration with the Pt who expressed a deep sense of peace in his relationship to "the higher power".  Pt's wife expressed a deep Christian faith which the Pt shared many elements of as well. Over all, both Pt and wife seem to be reconciled to their situation and have a sense of peace as they face what's next together.  Pt asked for prayer that he would be able to return home- some thing seems important to him. Chaplain remains available throughout the night.

## 2023-03-21 NOTE — ED Provider Notes (Signed)
[place] EMERGENCY DEPARTMENT AT [facility] Provider Note  CSN: [id number] Arrival date & time: 04/01/2023 1122  Chief Complaint(s) Chest Pain and Shortness of Breath  HPI [name] Steele is a 82 y.o. male here today with chest pain.  Patient woke up with severe chest pain, took 324 aspirin and nitro at home.  His pain had improved and EMS arrived.  EMS tracing did show diffuse ST segment depressions.  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  He declined CABG, was deemed not an intervenable candidate.  Denies fever or chills.  Past Medical History Past Medical History:  Diagnosis Date   Arthritis    CAD (coronary artery disease) 10/05/2022   Cath 09/2022: 3v CAD, critical LM stenosis - Pt opted for med Rx only>>DNR   Chronic kidney disease, stage 3a (HCC)    Chronic urinary tract infection    Diabetes mellitus without complication (HCC)    Gout    Heart murmur    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   S/p non-STEMI 09/2022>> three-vessel and critical left main stenosis; patient opted for medical management // TTE 09/2022: EF 20-25, GR 2 DD, RVSP 59.1, trivial MR, mild-moderate TR, mild aortic stenosis, mean gradient 10, V-max 206 cm/s, RAP 15   History of non-ST elevation myocardial infarction (NSTEMI) 10/05/2022   Hypertension    ILD (interstitial lung disease) (HCC)    by CT 01/2021   Ischemic cardiomyopathy 10/10/2022   Mild aortic stenosis    Mild dilation of ascending aorta (HCC)    Osteoporosis    Patient Active Problem List   Diagnosis Date Noted   Unstable angina (HCC) 03/01/2023   Orthostatic hypotension 03/01/2023   Chest pain 02/27/2023   Elevated troponin 02/27/2023   Hyperlipidemia 02/27/2023   Myocardial infarction due to demand ischemia (HCC) 10/17/2022   Acute on chronic hypoxic respiratory failure (HCC) 10/17/2022   CKD (chronic kidney disease) stage 3, GFR 30-59  ml/min (HCC) 10/16/2022   ILD (interstitial lung disease) (HCC) 10/16/2022   Mild Aortic stenosis 10/16/2022   Acute on chronic systolic heart failure (HCC) 10/14/2022   Ischemic cardiomyopathy 10/10/2022   NSTEMI (non-ST elevated myocardial infarction) (HCC) 10/05/2022   Near syncope 02/06/2022   Infrarenal abdominal aortic aneurysm (AAA) without rupture (HCC) 02/06/2022   Normocytic anemia 02/06/2022   Coronary artery disease due to lipid rich plaque 04/14/2021   Type 2 diabetes mellitus with complication, without long-term current use of insulin (HCC) 04/10/2017   Essential hypertension 04/10/2017   Home Medication(s) Prior to Admission medications   Medication Sig Start Date End Date Taking? Authorizing Provider  aspirin EC 81 MG tablet Take 1 tablet (81 mg total) by mouth daily. Swallow whole. 04/14/21   Jake Bathe, MD  clopidogrel (PLAVIX) 75 MG tablet Take 1 tablet (75 mg total) by mouth daily. 11/09/22 11/09/23  Jake Bathe, MD  furosemide (LASIX) 80 MG tablet Take 0.5 tablets (40 mg total) by mouth daily. 03/02/23   Marinda Elk, MD  ipratropium (ATROVENT) 0.06 % nasal spray Place 2 sprays into both nostrils 3 (three) times daily as needed for rhinitis. 12/05/21   [provider]  metFORMIN (GLUCOPHAGE-XR) 750 MG 24 hr tablet Take 750 mg by mouth daily after breakfast. 06/05/18   [provider]  metoprolol succinate (TOPROL-XL) 25 MG 24 hr tablet Take 0.5 tablets (12.5 mg total) by mouth daily. 11/02/22   Dyann Kief,  PA-C  nitroGLYCERIN (NITROSTAT) 0.4 MG SL tablet Place 1 tablet (0.4 mg total) under the tongue every 5 (five) minutes x 3 doses as needed for chest pain. 10/16/22   Abagail Kitchens, PA-C  ranolazine (RANEXA) 500 MG 12 hr tablet Take 1 tablet (500 mg total) by mouth 2 (two) times daily. 11/09/22   Jake Bathe, MD  rosuvastatin (CRESTOR) 20 MG tablet Take 1 tablet (20 mg total) by mouth daily. 10/10/22 10/10/23  Tereso Newcomer T, PA-C   spironolactone (ALDACTONE) 25 MG tablet Take 0.5 tablets (12.5 mg total) by mouth daily. 11/11/22 04/24/23  Jake Bathe, MD                                                                                                                                    Past Surgical History Past Surgical History:  Procedure Laterality Date   AORTIC ARCH ANGIOGRAPHY N/A 10/05/2022   Procedure: AORTIC ARCH ANGIOGRAPHY;  Surgeon: Tonny Bollman, MD;  Location: [facility] INVASIVE CV LAB;  Service: Cardiovascular;  Laterality: N/A;   CHOLECYSTECTOMY N/A 07/04/2018   Procedure: LAPAROSCOPIC CHOLECYSTECTOMY WITH INTRAOPERATIVE CHOLANGIOGRAM;  Surgeon: Abigail Miyamoto, MD;  Location: [facility] ORS;  Service: General;  Laterality: N/A;   ERCP N/A 07/05/2018   Procedure: ENDOSCOPIC RETROGRADE CHOLANGIOPANCREATOGRAPHY (ERCP);  Surgeon: Vida Rigger, MD;  Location: [facility] ENDOSCOPY;  Service: Endoscopy;  Laterality: N/A;   IABP INSERTION N/A 10/05/2022   Procedure: IABP Insertion;  Surgeon: Tonny Bollman, MD;  Location: [facility] INVASIVE CV LAB;  Service: Cardiovascular;  Laterality: N/A;   INGUINAL HERNIA REPAIR Right 07/31/2022   Procedure: OPEN RIGHT INGUINAL HERNIA REPAIR WITH MESH;  Surgeon: Berna Bue, MD;  Location: [facility] ORS;  Service: General;  Laterality: Right;   JOINT REPLACEMENT Bilateral    REMOVAL OF STONES  07/05/2018   Procedure: REMOVAL OF STONES;  Surgeon: Vida Rigger, MD;  Location: [facility] ENDOSCOPY;  Service: Endoscopy;;   RIGHT/LEFT HEART CATH AND CORONARY ANGIOGRAPHY N/A 10/05/2022   Procedure: RIGHT/LEFT HEART CATH AND CORONARY ANGIOGRAPHY;  Surgeon: Tonny Bollman, MD;  Location: [facility] INVASIVE CV LAB;  Service: Cardiovascular;  Laterality: N/A;   SPHINCTEROTOMY  07/05/2018   Procedure: SPHINCTEROTOMY;  Surgeon: Vida Rigger, MD;  Location: [facility] ENDOSCOPY;  Service: Endoscopy;;   Family History Family History  Problem Relation Age of Onset   Heart disease Sister     Social History Social History   Tobacco Use    Smoking status: Former    Current packs/day: 0.00    Average packs/day: 1 pack/day for 31.0 years (31.0 ttl pk-yrs)    Types: Cigarettes    Start date: 89    Quit date: 1988    Years since quitting: 36.8   Smokeless tobacco: Never  Vaping Use   Vaping status: Never Used  Substance Use Topics   Alcohol use: Yes    Alcohol/week: 0.0 standard drinks of alcohol    Comment: social  Drug use: No   Allergies Ak-mycin [erythromycin] and Penicillins  Review of Systems Review of Systems  Physical Exam Vital Signs  I have reviewed the triage vital signs Pulse (!) 108   Temp (!) 97.4 F (36.3 C) (Oral)   Resp (!) 32   Ht 5\' 10"  (1.778 m)   Wt 63.6 kg   BMI 20.12 kg/m   Physical Exam Constitutional:      Comments: Frail  Cardiovascular:     Rate and Rhythm: Tachycardia present.     Heart sounds: Normal heart sounds.  Pulmonary:     Effort: Pulmonary effort is normal.     Breath sounds: Normal breath sounds.  Musculoskeletal:     Cervical back: Normal range of motion.  Neurological:     Mental Status: He is alert.     ED Results and Treatments Labs (all labs ordered are listed, but only abnormal results are displayed) Labs Reviewed  CBC WITH DIFFERENTIAL/PLATELET  COMPREHENSIVE METABOLIC PANEL  BRAIN NATRIURETIC PEPTIDE  HEMOGLOBIN A1C  PROTIME-INR  APTT  LIPID PANEL  I-STAT CG4 LACTIC ACID, ED  TROPONIN I (HIGH SENSITIVITY)                                                                                                                          Radiology No results found.  Pertinent labs & imaging results that were available during my care of the patient were reviewed by me and considered in my medical decision making (see MDM for details).  Medications Ordered in ED Medications  aspirin chewable tablet 324 mg (has no administration in time range)  heparin injection 3,800 Units (has no administration in time range)  sodium chloride flush (NS) 0.9 %  injection 10 mL (has no administration in time range)                                                                                                                                     Procedures .Critical Care  Performed by: Arletha Pili, DO Authorized by: Arletha Pili, DO   Critical care provider statement:    Critical care time (minutes):  85   Critical care was necessary to treat or prevent imminent or life-threatening deterioration of the following conditions:  Cardiac failure   Critical care was time spent personally by me on the following activities:  Development of treatment plan with patient  or surrogate, discussions with consultants, evaluation of patient's response to treatment, examination of patient, ordering and review of laboratory studies, ordering and review of radiographic studies, ordering and performing treatments and interventions, pulse oximetry, re-evaluation of patient's condition and review of old charts   I assumed direction of critical care for this patient from another provider in my specialty: no     Care discussed with: admitting provider     (including critical care time)  Medical Decision Making / ED Course   This patient presents to the ED for concern of chest pain, this involves an extensive number of treatment options, and is a complaint that carries with it a high risk of complications and morbidity.  The differential diagnosis includes ACS, less likely PE, less likely dissection, less likely pneumonia, less likely pneumothorax.  MDM: 82 year old male here today with chest pain.  Initial EKG shows concern for acute MI.  Patient has ST elevations in aVR, diffuse ST depressions.  Concerning for left main disease versus posterior MI.  Have paged the patient as a STEMI alert, although with his history it is unlikely that he would be taken to Cath Lab.  Have ordered heparin for the patient.  Patient's chest pain significantly improved from earlier.   Bedside ultrasound showed diffuse apical and septal hypokinesis.  Reassessment 11:45 PM-patient's blood pressure dropped to 60s over 50s.  Started on dobutamine and norepinephrine.  Discussed goals of care with the patient, and he told me that he would not want any resuscitative efforts.    Patient's CODE STATUS is DO NOT RESUSCITATE.    Spoke with on-call interventional cardiologist Dr. Excell Seltzer who is very familiar with the patient.  He came and evaluated the patient, does not believe that there is intervention for the patient.  Believe this is likely to be end-of-life event for the patient.  Family currently coming in.  Patient's pressures have improved with pressor support.  Will admit patient to ICU.  Reassessment 1:30 PM-pressures have improved with pressor support.  Family is arrived at bedside.  Had a goals of care conversation with the family.  They state that the patient's wish would be to pass away at home if his condition did not improve.  Patient is being admitted to the ICU by cardiologist Dr. Excell Seltzer.  Additional history obtained: -Additional history obtained from EMS -External records from outside source obtained and reviewed including: Chart review including previous notes, labs, imaging, consultation notes   Lab Tests: -I ordered, reviewed, and interpreted labs.   The pertinent results include:   Labs Reviewed  CBC WITH DIFFERENTIAL/PLATELET  COMPREHENSIVE METABOLIC PANEL  BRAIN NATRIURETIC PEPTIDE  HEMOGLOBIN A1C  PROTIME-INR  APTT  LIPID PANEL  I-STAT CG4 LACTIC ACID, ED  TROPONIN I (HIGH SENSITIVITY)      EKG acute MI  EKG Interpretation Date/Time:    Ventricular Rate:    PR Interval:    QRS Duration:    QT Interval:    QTC Calculation:   R Axis:      Text Interpretation:           Imaging Studies ordered:    Medicines ordered and prescription drug management: Meds ordered this encounter  Medications   aspirin chewable tablet 324 mg    heparin injection 3,800 Units   sodium chloride flush (NS) 0.9 % injection 10 mL    -I have reviewed the patients home medicines and have made adjustments as needed  Critical interventions Pressor support, management of cardiogenic shock.  Consultations Obtained: I requested consultation with the cardiology team,  and discussed lab and imaging findings as well as pertinent plan - they recommend: Admission   Cardiac Monitoring: The patient was maintained on a cardiac monitor.  I personally viewed and interpreted the cardiac monitored which showed an underlying rhythm of: Sinus rhythm  Social Determinants of Health:     Reevaluation: After the interventions noted above, I reevaluated the patient and found that they have :improved  Co morbidities that complicate the patient evaluation  Past Medical History:  Diagnosis Date   Arthritis    CAD (coronary artery disease) 10/05/2022   Cath 09/2022: 3v CAD, critical LM stenosis - Pt opted for med Rx only>>DNR   Chronic kidney disease, stage 3a (HCC)    Chronic urinary tract infection    Diabetes mellitus without complication (HCC)    Gout    Heart murmur    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   S/p non-STEMI 09/2022>> three-vessel and critical left main stenosis; patient opted for medical management // TTE 09/2022: EF 20-25, GR 2 DD, RVSP 59.1, trivial MR, mild-moderate TR, mild aortic stenosis, mean gradient 10, V-max 206 cm/s, RAP 15   History of non-ST elevation myocardial infarction (NSTEMI) 10/05/2022   Hypertension    ILD (interstitial lung disease) (HCC)    by CT 01/2021   Ischemic cardiomyopathy 10/10/2022   Mild aortic stenosis    Mild dilation of ascending aorta (HCC)    Osteoporosis       Dispostion: Admit to ICU, palliative care consultation.     Final Clinical Impression(s) / ED Diagnoses Final diagnoses:  None     @PCDICTATION @    Anders Simmonds T, DO 04/16/2023 1338

## 2023-03-22 DIAGNOSIS — Z66 Do not resuscitate: Secondary | ICD-10-CM

## 2023-03-22 DIAGNOSIS — Z7189 Other specified counseling: Secondary | ICD-10-CM

## 2023-03-22 DIAGNOSIS — Z515 Encounter for palliative care: Secondary | ICD-10-CM

## 2023-03-22 DIAGNOSIS — R52 Pain, unspecified: Secondary | ICD-10-CM

## 2023-03-22 DIAGNOSIS — I249 Acute ischemic heart disease, unspecified: Secondary | ICD-10-CM | POA: Diagnosis not present

## 2023-03-22 DIAGNOSIS — R570 Cardiogenic shock: Secondary | ICD-10-CM | POA: Diagnosis not present

## 2023-03-22 DIAGNOSIS — Z789 Other specified health status: Secondary | ICD-10-CM

## 2023-03-22 DIAGNOSIS — I2101 ST elevation (STEMI) myocardial infarction involving left main coronary artery: Secondary | ICD-10-CM

## 2023-03-22 LAB — CBC
HCT: 46.6 % (ref 39.0–52.0)
Hemoglobin: 13.9 g/dL (ref 13.0–17.0)
MCH: 33.8 pg (ref 26.0–34.0)
MCHC: 29.8 g/dL — ABNORMAL LOW (ref 30.0–36.0)
MCV: 113.4 fL — ABNORMAL HIGH (ref 80.0–100.0)
Platelets: 195 10*3/uL (ref 150–400)
RBC: 4.11 MIL/uL — ABNORMAL LOW (ref 4.22–5.81)
RDW: 13.9 % (ref 11.5–15.5)
WBC: 15 10*3/uL — ABNORMAL HIGH (ref 4.0–10.5)
nRBC: 0.5 % — ABNORMAL HIGH (ref 0.0–0.2)

## 2023-03-22 LAB — HEPARIN LEVEL (UNFRACTIONATED): Heparin Unfractionated: >1.1 [IU]/mL — ABNORMAL HIGH (ref 0.30–0.70)

## 2023-03-22 MED ORDER — GLYCOPYRROLATE 0.2 MG/ML IJ SOLN: 0.2000 mg | INTRAMUSCULAR | Status: DC | PRN

## 2023-03-22 MED ORDER — CHLORHEXIDINE GLUCONATE CLOTH 2 % EX PADS: 6.0000 | MEDICATED_PAD | Freq: Every day | CUTANEOUS | Status: DC

## 2023-03-22 MED ORDER — GLYCOPYRROLATE 1 MG PO TABS: 1.0000 mg | ORAL_TABLET | ORAL | Status: DC | PRN

## 2023-03-22 MED ORDER — LORAZEPAM 2 MG/ML IJ SOLN
1.0000 mg | INTRAMUSCULAR | Status: DC | PRN
  Administered 2023-03-22: 2 mg via INTRAVENOUS
  Filled 2023-03-22: qty 1

## 2023-03-22 MED ORDER — POLYVINYL ALCOHOL 1.4 % OP SOLN: 1.0000 [drp] | Freq: Four times a day (QID) | OPHTHALMIC | Status: DC | PRN

## 2023-03-22 MED ORDER — MORPHINE BOLUS VIA INFUSION
2.0000 mg | INTRAVENOUS | Status: DC | PRN
  Administered 2023-03-22: 2 mg via INTRAVENOUS

## 2023-03-22 MED ORDER — MORPHINE 100MG IN NS 100ML (1MG/ML) PREMIX INFUSION
2.0000 mg/h | INTRAVENOUS | Status: DC
Start: 2023-03-22 — End: 2023-03-22
  Administered 2023-03-22: 2 mg/h via INTRAVENOUS
  Filled 2023-03-22: qty 100

## 2023-03-22 MED ORDER — ACETAMINOPHEN 325 MG PO TABS: 650.0000 mg | ORAL_TABLET | Freq: Four times a day (QID) | ORAL | Status: DC | PRN

## 2023-03-22 MED ORDER — ACETAMINOPHEN 650 MG RE SUPP: 650.0000 mg | Freq: Four times a day (QID) | RECTAL | Status: DC | PRN

## 2023-03-29 ENCOUNTER — Telehealth: Payer: Self-pay | Admitting: Cardiovascular Disease

## 2023-03-29 DIAGNOSIS — I5043 Acute on chronic combined systolic (congestive) and diastolic (congestive) heart failure: Secondary | ICD-10-CM | POA: Diagnosis not present

## 2023-03-29 DIAGNOSIS — E1122 Type 2 diabetes mellitus with diabetic chronic kidney disease: Secondary | ICD-10-CM | POA: Diagnosis not present

## 2023-03-29 DIAGNOSIS — N1832 Chronic kidney disease, stage 3b: Secondary | ICD-10-CM | POA: Diagnosis not present

## 2023-03-29 DIAGNOSIS — I130 Hypertensive heart and chronic kidney disease with heart failure and stage 1 through stage 4 chronic kidney disease, or unspecified chronic kidney disease: Secondary | ICD-10-CM | POA: Diagnosis not present

## 2023-03-29 NOTE — Telephone Encounter (Signed)
Spoke with [employer] and advised death certificate has now been signed by Dr Excell Seltzer.  She thanked [employer] fund[employer] for the callback.

## 2023-03-29 NOTE — Telephone Encounter (Signed)
[employer] would like a c/b regarding Dr. Excell Seltzer signing Death Certificate. Please advise

## 2023-04-18 NOTE — Progress Notes (Signed)
Geoffry Paradise NP aware of pt status experiencing crushing chest pain Levo at 22 mcg through PIV. She is to notify Dr Joaquim Nam to come to bedside to clarify status and plan of care.

## 2023-04-18 NOTE — Progress Notes (Signed)
Pt with SBP in 80's MAP  >65 on 22 mcg of levophed. On call palliative paged notified of pt status. Palliative to prioritize pt to be seen soon.

## 2023-04-18 NOTE — Death Summary Note (Signed)
   Death Summary    Patient ID: [name] Steele MRN: [id number]; DOB: Jul 21, 1940  Admit date: 04/16/2023 Discharge date: 03/31/2023  PCP:  [name], MD   [facility] Providers Cardiologist:  Donato Schultz, MD   {  Discharge Diagnoses    Principal Problem:   STEMI involving left main coronary artery [facility]) Active Problems:   ST elevation myocardial infarction involving left main coronary artery (HCC)   Cardiogenic shock [facility])  Death summary: 82 y/o male with severe multivessel an critical LM CAD, recurrent chest pain, admitted with ACS  Patient previously underwent heart catheterization in 09/2022 for NSTEMI, that showed sefere multivessel CAD, not amenable to PCI or CABG. Patient had opted to be DNR and hoped not be readmitted. However, he was readmitted on 03/27/2023 with chest pain and ST elevation, complicated by cardiogenic shock. Given no revascularization options, treatment options were limited. Patient's pain was controlled with IV fentanyl. There initial wishes were to go home with palliative care. However, patient was vasopressor dependent and it was clear that he would not be able to go home with palliative care. At patient's wishes, as echoed by his wife, vasopressor support was weaned off after arrival of immediate family and comfort care measures were pursued. Patient passed peacefully in presence of immediate family.   Time of death: 12:52 PM Signed, Elder Negus, MD 03/26/2023, 4:37 PM

## 2023-04-18 NOTE — Progress Notes (Signed)
To combat alarm fatigue respiratory rate and SpO2 monitoring deactivated due to repeated alarms and pt now comfort care.

## 2023-04-18 NOTE — Progress Notes (Signed)
Dr Joaquim Nam at bedside.

## 2023-04-18 NOTE — Progress Notes (Signed)
Pt asystole on monitor, pt wife at bedside.

## 2023-04-18 NOTE — Consult Note (Signed)
Consultation Note Date: 04/12/2023   Patient Name: [name] Steele  DOB: [birth year]  MRN: [id number]  Age / Sex: 82 y.o., male  PCP: [name], MD Referring Physician: Tonny Bollman, MD  Reason for Consultation: Non pain symptom management, Pain control, Psychosocial/spiritual support, and Terminal Care  HPI/Patient Profile: 82 y.o. 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 He was admitted to the ICU for vasopressor support. After discussions with cardiology, patient was clear in his desire for no aggressive treatment and was hopeful to return home for end of life care. He was transitioned to full comfort measures on 04/07/2023.  Of note, patient has had 4 hospital admissions in the last 6 months. PMT saw during previous hospitalization in May 2024 - he discharged with outpatient Palliative Care.  Clinical Assessment and Goals of Care: I have reviewed medical records including EPIC notes, labs, and imaging. Received report from primary RN - no acute concerns. Per RN patient has ongoing complaints of severe chest pain that are temporarily relieved by fentanyl.   Went to visit patient at bedside - 8 family/visitors present. Allowed space and time for family and visitors to finish prayer and song with patient. Patient was lying in bed - he wakes to voice/gentle touch but is drowsy and does not verbally communicate. No signs or non-verbal gestures of pain or discomfort noted - he has recently received dose of fentanyl. No respiratory distress, increased work of breathing, or secretions noted.   Met with patient and family at bedside (wife, 2 sons, DIL, and brother)  to discuss diagnosis, prognosis, GOC, EOL wishes, disposition, and  options.  I re-introduced Palliative Medicine as specialized medical care for people living with serious illness. It focuses on providing relief from the symptoms and stress of a serious illness. The goal is to improve quality of life for both the patient and the family.  Confirmed patient's goal for comfort measures only. Family have a clear understanding of patient's current acute medical situation.   Discussed patient's hope to return home for end of life - reviewed his hypotension despite high doses of vasopressors and concern that he likely would be at high risk of passing away during transport returning home once vasopressors are stopped. Also discussed concern patient's symptoms would not adequately be managed in the home setting. Discussed option of patient remaining in house for EOL care so family can be present vs attempting to get him back home with high risk of passing away in transport. Family would prefer patient remain in house for end of life care. Prognosis reviewed.  Visit also consisted of discussions dealing with the complex and emotionally intense issues of symptom management and palliative care in the setting terminal care.  Therapeutic listening provided as patient's wife reflects on their 49 years of marriage, their children, and the events that lead to patient's admission.   Discussed with patient/family the importance of continued conversation with each other and the medical providers regarding overall plan of care and treatment options, ensuring decisions are within the context of the patient's values and GOCs.  All family have arrived and/or visited - they are ok with discontinuing vasopressor support.   Chaplain visit offered - patient's Minister has provided support to them today - they kindly decline.  Family express appreciation for PMT visit today.  Questions and concerns were addressed.  The patient/family was encouraged to call with questions and/or concerns. PMT  card was provided.  Discussed symptom management plan with primary RN.   Primary Decision Maker: PATIENT , next of kin    SUMMARY OF RECOMMENDATIONS   Continue full comfort measures Continue DNR/DNI as previously documented  Unfortunately, patient is at high risk of passing away during transport. Family have opted he remain in house for EOL care - anticipate hospital death Added orders for EOL symptom management and to reflect full comfort measures, as well as discontinued orders that were not focused on comfort Nursing to provide frequent assessments and administer PRN medications as clinically necessary to ensure EOL comfort PMT will continue to follow and support holistically  Symptom Management Continuous morphine infusion with bolus doses for breakthrough pain/dyspnea/increased work of breathing/RR>25 Tylenol PRN pain/fever Robinul PRN secretions Ativan PRN anxiety/seizure/sleep/distress Zofran PRN nausea/vomiting Nitroglycerin PRN chest pain Liquifilm Tears PRN dry eye    Code Status/Advance Care Planning: DNR  Palliative Prophylaxis:  Aspiration, Delirium Protocol, Frequent Pain Assessment, Oral Care, and Turn Reposition  Additional Recommendations (Limitations, Scope, Preferences): Full Comfort Care  Psycho-social/Spiritual:  Desire for further Chaplaincy support:no Created space and opportunity for patient and family to express thoughts and feelings regarding patient's current medical situation.  Emotional support and therapeutic listening provided.  Prognosis:  Hours  Discharge Planning: Anticipated Hospital Death      Primary Diagnoses: Present on Admission:  ST elevation myocardial infarction involving left main coronary artery [facility])  STEMI involving left main coronary artery (HCC)   I have reviewed the medical record, interviewed the patient and family, and examined the patient. The following aspects are pertinent.  Past Medical History:   Diagnosis Date   Arthritis    CAD (coronary artery disease) 10/05/2022   Cath 09/2022: 3v CAD, critical LM stenosis - Pt opted for med Rx only>>DNR   Chronic kidney disease, stage 3a (HCC)    Chronic urinary tract infection    Diabetes mellitus without complication (HCC)    Gout    Heart murmur    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   S/p non-STEMI 09/2022>> three-vessel and critical left main stenosis; patient opted for medical management // TTE 09/2022: EF 20-25, GR 2 DD, RVSP 59.1, trivial MR, mild-moderate TR, mild aortic stenosis, mean gradient 10, V-max 206 cm/s, RAP 15   History of non-ST elevation myocardial infarction (NSTEMI) 10/05/2022   Hypertension    ILD (interstitial lung disease) (HCC)    by CT 01/2021   Ischemic cardiomyopathy 10/10/2022   Mild aortic stenosis    Mild dilation of ascending aorta (HCC)    Osteoporosis    Social History   Socioeconomic History   Marital status: Married    Spouse name: Alice   Number of children: 2   Years of education: Not on file   Highest education level: Not on file  Occupational History   Occupation: Retired  Tobacco Use   Smoking status: Former    Current packs/day: 0.00    Average packs/day: 1 pack/day for 31.0 years (31.0 ttl pk-yrs)    Types: Cigarettes    Start date: 36    Quit date: 1988    Years since quitting: 36.8   Smokeless tobacco: Never  Vaping Use   Vaping status: Never Used  Substance and Sexual Activity   Alcohol use: Yes    Alcohol/week: 0.0 standard drinks of alcohol    Comment: social   Drug use: No  Sexual activity: Not Currently  Other Topics Concern   Not on file  Social History Narrative   Not on file   Social Determinants of Health   Financial Resource Strain: Low Risk  (10/07/2022)   Overall Financial Resource Strain (CARDIA)    Difficulty of Paying Living Expenses: Not very hard  Food Insecurity: No Food Insecurity (02/28/2023)   Hunger Vital Sign     Worried About Running Out of Food in the Last Year: Never true    Ran Out of Food in the Last Year: Never true  Transportation Needs: No Transportation Needs (02/28/2023)   PRAPARE - Administrator, Civil Service (Medical): No    Lack of Transportation (Non-Medical): No  Physical Activity: Not on file  Stress: Not on file  Social Connections: Not on file   Family History  Problem Relation Age of Onset   Heart disease Sister    Scheduled Meds:  aspirin EC  81 mg Oral Daily   Chlorhexidine Gluconate Cloth  6 each Topical Daily   clopidogrel  75 mg Oral Daily   ranolazine  500 mg Oral BID   rosuvastatin  20 mg Oral Daily   sodium chloride flush  10 mL Intravenous Q12H   Continuous Infusions:  DOBUTamine Stopped (04/06/2023 2000)   heparin 1,000 Units/hr (04/17/2023 0900)   norepinephrine (LEVOPHED) Adult infusion 22 mcg/min (04/17/2023 0916)   PRN Meds:.acetaminophen **OR** acetaminophen, acetaminophen, fentaNYL (SUBLIMAZE) injection, glycopyrrolate **OR** glycopyrrolate **OR** glycopyrrolate, nitroGLYCERIN, ondansetron (ZOFRAN) IV, polyvinyl alcohol Medications Prior to Admission:  Prior to Admission medications   Medication Sig Start Date End Date Taking? Authorizing Provider  aspirin EC 81 MG tablet Take 1 tablet (81 mg total) by mouth daily. Swallow whole. 04/14/21  Yes Jake Bathe, MD  clopidogrel (PLAVIX) 75 MG tablet Take 1 tablet (75 mg total) by mouth daily. 11/09/22 11/09/23 Yes Jake Bathe, MD  furosemide (LASIX) 80 MG tablet Take 0.5 tablets (40 mg total) by mouth daily. 03/02/23  Yes Marinda Elk, MD  ipratropium (ATROVENT) 0.06 % nasal spray Place 2 sprays into both nostrils 3 (three) times daily as needed for rhinitis. 12/05/21  Yes [provider]  metFORMIN (GLUCOPHAGE-XR) 750 MG 24 hr tablet Take 750 mg by mouth daily after breakfast. 06/05/18  Yes [provider]  metoprolol succinate (TOPROL-XL) 25 MG 24 hr tablet Take 0.5  tablets (12.5 mg total) by mouth daily. 11/02/22  Yes Dyann Kief, PA-C  nitroGLYCERIN (NITROSTAT) 0.4 MG SL tablet Place 1 tablet (0.4 mg total) under the tongue every 5 (five) minutes x 3 doses as needed for chest pain. 10/16/22  Yes Abagail Kitchens, PA-C  ranolazine (RANEXA) 500 MG 12 hr tablet Take 1 tablet (500 mg total) by mouth 2 (two) times daily. 11/09/22  Yes Jake Bathe, MD  rosuvastatin (CRESTOR) 20 MG tablet Take 1 tablet (20 mg total) by mouth daily. 10/10/22 10/10/23 Yes Weaver, Scott T, PA-C  spironolactone (ALDACTONE) 25 MG tablet Take 0.5 tablets (12.5 mg total) by mouth daily. 11/11/22 04/24/23 Yes Jake Bathe, MD   Allergies  Allergen Reactions   Ak-Mycin [Erythromycin] Other (See Comments)    Severe Abdominal Pain   Penicillins Hives   Review of Systems  Unable to perform ROS: Acuity of condition    Physical Exam Vitals and nursing note reviewed.  Constitutional:      General: He is not in acute distress.    Appearance: He is ill-appearing.  Pulmonary:  Effort: No respiratory distress.  Skin:    General: Skin is warm and dry.  Neurological:     Mental Status: He is lethargic.     Motor: Weakness present.     Vital Signs: BP (!) 85/75   Pulse 99   Temp (!) 97.2 F (36.2 C) (Axillary)   Resp (!) 37   Ht 5\' 10"  (1.778 m)   Wt 63.6 kg   SpO2 96%   BMI 20.12 kg/m  Pain Scale: 0-10   Pain Score: Asleep   SpO2: SpO2: 96 % O2 Device:SpO2: 96 % O2 Flow Rate: .O2 Flow Rate (L/min): 4 L/min  IO: Intake/output summary:  Intake/Output Summary (Last 24 hours) at 04/04/2023 1019 Last data filed at 04/04/2023 0900 Gross per 24 hour  Intake 1685.79 ml  Output --  Net 1685.79 ml    LBM:   Baseline Weight: Weight: 63.6 kg Most recent weight: Weight: 63.6 kg     Palliative Assessment/Data: PPS 10%     Time In: 1015 Time Out: 1145 Time Total: 90 minutes  Signed by: Haskel Khan, NP   Please contact Palliative Medicine Team phone at  [phone] for questions and concerns.  For individual provider: See Amion  *Portions of this note are a verbal dictation therefore any spelling and/or grammatical errors are due to the "Dragon [employer] One" system interpretation.

## 2023-04-18 NOTE — Progress Notes (Signed)
Received another VM stating current shoe is also NIS Emailed ST to update them on shoe choices  Risa Auman Cped, CFo, CFm

## 2023-04-18 NOTE — Progress Notes (Addendum)
Patient Name: [name] Steele Date of Encounter: 04/04/2023 [facility] Cardiologist: [name] Schultz, MD   Interval Summary  .    Recurrent chest pain, controlled with repeated doses of IV fentanyl. Multiple ectopies and nonsustained ventricular tachycardia on telemetry Worsening hypotension in spite of high doses of norepinephrine  Wife understands that prognosis is poor.  Their initial wishes were to go home with palliative care measures, but she understands that he may not make it beyond few hours at best.  She is contacting her son, daughter-in-law, and grandson who live locally so that they can be here with the patient when he passes.  Vital Signs .    Vitals:   04/06/2023 0800 03/28/2023 0809 04/17/2023 0815 03/20/2023 0830  BP: 90/74  (!) 80/66 (!) 82/19  Pulse: 94   (!) 105  Resp: (!) 26  19 18   Temp:  (!) 97.2 F (36.2 C)    TempSrc:  Axillary    SpO2: 93%   99%  Weight:      Height:        Intake/Output Summary (Last 24 hours) at 03/31/2023 0842 Last data filed at 03/25/2023 0800 Gross per 24 hour  Intake 1593.57 ml  Output --  Net 1593.57 ml      04/17/2023   11:38 AM 03/16/2023    3:18 PM 03/02/2023    3:35 AM  Last 3 Weights  Weight (lbs) 140 lb 3.4 oz 140 lb 3.2 oz 135 lb 2.3 oz  Weight (kg) 63.6 kg 63.594 kg 61.3 kg      Telemetry/ECG    04/11/2023- Personally Reviewed Multiple ventricular ectopies, nonsustained reticular tachycardia  Physical Exam .   Physical Exam Vitals and nursing note reviewed.  Constitutional:      General: He is not in acute distress.    Appearance: He is cachectic. He is ill-appearing.  Neck:     Vascular: No JVD.  Cardiovascular:     Rate and Rhythm: Tachycardia present.  Pulmonary:     Effort: Tachypnea present.  Neurological:     Comments: Drowsy      Assessment & Plan .     82 y/o male with severe multivessel an critical LM CAD, recurrent chest pain, admitted with ACS  ACS: Possibly STEMI, no emergent  revascularization performed given known severe multivessel and critical LM CAD. Based on coronary anatomy noted in 09/2022, patient is not a candidate for either CABG or percutaneous revascularization. Wife at bedside, patient is currently drowsy.  She reiterated patient's wishes of DNR. There initial wishes were to go home with palliative care.  However, wife understands that he is on high doses of vasopressor medications and continues to be hypotensive in spite of that; and that he will not be able to make it home in the current situation. She would like her son, daughter-in-law, and grandson to be with him when he passes. We mutually decided to continue vasopressors, if possible, until they are physically here. Pursue full DNR and comfort care measures, except for continuation of vasopressors for now. Once the family is here, we can wean off vasopressors and let the natural course take place. She has talked to chaplain last night, and has been in touch with the administrator as well. She expressed their wishes for his body to be cremated.   CRITICAL CARE Performed by: [name] Steele   Total critical care time: 30 minutes   Critical care time was exclusive of separately billable procedures and treating other patients.  Critical care was necessary to treat or prevent imminent or life-threatening deterioration. At the same time, it was necessary to discuss with wife at length, to ensure we respect his wishes of DNR and no escalation of care.  We will continue vasopressor medications until certain family members are at bedside, if possible.   Critical care was time spent personally by me on the following activities: development of treatment plan with patient and/or surrogate as well as nursing, discussions with consultants, evaluation of patient's response to treatment, examination of patient, obtaining history from patient or surrogate, ordering and performing treatments and interventions,  ordering and review of laboratory studies, ordering and review of radiographic studies, pulse oximetry and re-evaluation of patient's condition.     For questions or updates, please contact [facility] Please consult [email or web address] for contact info under        Signed, [name] Negus, MD

## 2023-04-18 NOTE — Progress Notes (Signed)
Order clarification with Dr Excell Seltzer regarding pt DNR status. Do not intubate no ACLS meds. MD getting ready to scrub in unable to change order in Epic at this time.

## 2023-04-18 DEATH — deceased

## 2023-06-01 ENCOUNTER — Ambulatory Visit: Payer: Medicare Other | Admitting: Podiatry

## 2024-03-30 ENCOUNTER — Other Ambulatory Visit (HOSPITAL_COMMUNITY): Payer: Self-pay
